# Patient Record
Sex: Female | Born: 1965 | Race: Black or African American | Hispanic: No | Marital: Single | Smoking: Former smoker
Health system: Southern US, Community
[De-identification: ages and names within clinical notes are randomized; demographics above are authoritative.]

## PROBLEM LIST (undated history)

## (undated) DIAGNOSIS — E079 Disorder of thyroid, unspecified: Secondary | ICD-10-CM

## (undated) DIAGNOSIS — N63 Unspecified lump in unspecified breast: Secondary | ICD-10-CM

## (undated) DIAGNOSIS — C50919 Malignant neoplasm of unspecified site of unspecified female breast: Secondary | ICD-10-CM

## (undated) DIAGNOSIS — C801 Malignant (primary) neoplasm, unspecified: Secondary | ICD-10-CM

## (undated) DIAGNOSIS — I10 Essential (primary) hypertension: Secondary | ICD-10-CM

## (undated) DIAGNOSIS — Z923 Personal history of irradiation: Secondary | ICD-10-CM

## (undated) HISTORY — DX: Malignant (primary) neoplasm, unspecified: C80.1

## (undated) HISTORY — PX: KNEE ARTHROSCOPY: SUR90

## (undated) HISTORY — DX: Malignant neoplasm of unspecified site of unspecified female breast: C50.919

## (undated) HISTORY — DX: Unspecified lump in unspecified breast: N63.0

## (undated) HISTORY — PX: BREAST SURGERY: SHX581

## (undated) HISTORY — DX: Disorder of thyroid, unspecified: E07.9

## (undated) HISTORY — DX: Personal history of irradiation: Z92.3

---

## 2004-08-09 HISTORY — PX: KNEE CARTILAGE SURGERY: SHX688

## 2006-02-25 ENCOUNTER — Emergency Department (HOSPITAL_COMMUNITY): Admission: EM | Admit: 2006-02-25 | Discharge: 2006-02-26 | Payer: Self-pay | Admitting: Emergency Medicine

## 2008-08-11 ENCOUNTER — Emergency Department (HOSPITAL_COMMUNITY): Admission: EM | Admit: 2008-08-11 | Discharge: 2008-08-11 | Payer: Self-pay | Admitting: Emergency Medicine

## 2008-09-04 ENCOUNTER — Emergency Department (HOSPITAL_COMMUNITY): Admission: EM | Admit: 2008-09-04 | Discharge: 2008-09-04 | Payer: Self-pay | Admitting: Emergency Medicine

## 2009-07-26 ENCOUNTER — Emergency Department (HOSPITAL_COMMUNITY): Admission: EM | Admit: 2009-07-26 | Discharge: 2009-07-26 | Payer: Self-pay | Admitting: Emergency Medicine

## 2009-09-09 DIAGNOSIS — C50919 Malignant neoplasm of unspecified site of unspecified female breast: Secondary | ICD-10-CM

## 2009-09-09 HISTORY — DX: Malignant neoplasm of unspecified site of unspecified female breast: C50.919

## 2009-09-15 ENCOUNTER — Ambulatory Visit: Payer: Self-pay | Admitting: Family Medicine

## 2009-09-15 ENCOUNTER — Inpatient Hospital Stay (HOSPITAL_COMMUNITY): Admission: EM | Admit: 2009-09-15 | Discharge: 2009-09-20 | Payer: Self-pay | Admitting: Emergency Medicine

## 2009-09-15 ENCOUNTER — Ambulatory Visit: Payer: Self-pay | Admitting: Pulmonary Disease

## 2009-09-16 ENCOUNTER — Encounter: Admission: RE | Admit: 2009-09-16 | Discharge: 2009-09-16 | Payer: Self-pay | Admitting: Family Medicine

## 2009-09-17 ENCOUNTER — Ambulatory Visit: Payer: Self-pay | Admitting: Oncology

## 2009-09-18 ENCOUNTER — Encounter: Payer: Self-pay | Admitting: Oncology

## 2009-09-19 ENCOUNTER — Encounter: Payer: Self-pay | Admitting: Family Medicine

## 2009-10-01 ENCOUNTER — Encounter: Payer: Self-pay | Admitting: Family Medicine

## 2009-10-02 ENCOUNTER — Ambulatory Visit (HOSPITAL_BASED_OUTPATIENT_CLINIC_OR_DEPARTMENT_OTHER): Admission: RE | Admit: 2009-10-02 | Discharge: 2009-10-02 | Payer: Self-pay | Admitting: Surgery

## 2009-10-07 LAB — CBC WITH DIFFERENTIAL/PLATELET
BASO%: 0.1 % (ref 0.0–2.0)
Basophils Absolute: 0 10*3/uL (ref 0.0–0.1)
EOS%: 0.1 % (ref 0.0–7.0)
HGB: 12.7 g/dL (ref 11.6–15.9)
MCH: 27.2 pg (ref 25.1–34.0)
MCHC: 32.6 g/dL (ref 31.5–36.0)
MCV: 83.3 fL (ref 79.5–101.0)
MONO%: 2.8 % (ref 0.0–14.0)
NEUT%: 88.3 % — ABNORMAL HIGH (ref 38.4–76.8)
RDW: 16.2 % — ABNORMAL HIGH (ref 11.2–14.5)
lymph#: 0.8 10*3/uL — ABNORMAL LOW (ref 0.9–3.3)

## 2009-10-07 LAB — COMPREHENSIVE METABOLIC PANEL
ALT: 35 U/L (ref 0–35)
AST: 23 U/L (ref 0–37)
Albumin: 4.3 g/dL (ref 3.5–5.2)
Alkaline Phosphatase: 77 U/L (ref 39–117)
Calcium: 9.4 mg/dL (ref 8.4–10.5)
Chloride: 101 mEq/L (ref 96–112)
Potassium: 4.2 mEq/L (ref 3.5–5.3)
Sodium: 134 mEq/L — ABNORMAL LOW (ref 135–145)

## 2009-10-08 ENCOUNTER — Ambulatory Visit: Admission: RE | Admit: 2009-10-08 | Discharge: 2009-11-14 | Payer: Self-pay | Admitting: Radiation Oncology

## 2009-10-15 LAB — CBC WITH DIFFERENTIAL/PLATELET
BASO%: 0.1 % (ref 0.0–2.0)
EOS%: 0.4 % (ref 0.0–7.0)
HCT: 36.9 % (ref 34.8–46.6)
LYMPH%: 15 % (ref 14.0–49.7)
MCH: 28.2 pg (ref 25.1–34.0)
MCHC: 33.6 g/dL (ref 31.5–36.0)
MONO%: 6.3 % (ref 0.0–14.0)
NEUT%: 78.2 % — ABNORMAL HIGH (ref 38.4–76.8)
Platelets: 174 10*3/uL (ref 145–400)
RBC: 4.4 10*6/uL (ref 3.70–5.45)
WBC: 8.1 10*3/uL (ref 3.9–10.3)

## 2009-10-17 ENCOUNTER — Ambulatory Visit: Payer: Self-pay | Admitting: Oncology

## 2009-10-21 LAB — CBC WITH DIFFERENTIAL/PLATELET
Basophils Absolute: 0 10*3/uL (ref 0.0–0.1)
Eosinophils Absolute: 0 10*3/uL (ref 0.0–0.5)
LYMPH%: 7.1 % — ABNORMAL LOW (ref 14.0–49.7)
MCH: 28.2 pg (ref 25.1–34.0)
MCHC: 33.9 g/dL (ref 31.5–36.0)
MONO#: 0.6 10*3/uL (ref 0.1–0.9)
MONO%: 7.4 % (ref 0.0–14.0)
RBC: 4.19 10*6/uL (ref 3.70–5.45)
RDW: 16 % — ABNORMAL HIGH (ref 11.2–14.5)
WBC: 8.1 10*3/uL (ref 3.9–10.3)
lymph#: 0.6 10*3/uL — ABNORMAL LOW (ref 0.9–3.3)

## 2009-10-28 LAB — CBC WITH DIFFERENTIAL/PLATELET
Basophils Absolute: 0 10*3/uL (ref 0.0–0.1)
Eosinophils Absolute: 0 10*3/uL (ref 0.0–0.5)
HCT: 36.6 % (ref 34.8–46.6)
HGB: 12.1 g/dL (ref 11.6–15.9)
LYMPH%: 2 % — ABNORMAL LOW (ref 14.0–49.7)
MONO#: 1.1 10*3/uL — ABNORMAL HIGH (ref 0.1–0.9)
NEUT#: 14.1 10*3/uL — ABNORMAL HIGH (ref 1.5–6.5)
NEUT%: 90.6 % — ABNORMAL HIGH (ref 38.4–76.8)
Platelets: 453 10*3/uL — ABNORMAL HIGH (ref 145–400)
WBC: 15.5 10*3/uL — ABNORMAL HIGH (ref 3.9–10.3)
lymph#: 0.3 10*3/uL — ABNORMAL LOW (ref 0.9–3.3)

## 2009-10-28 LAB — COMPREHENSIVE METABOLIC PANEL
Albumin: 4.3 g/dL (ref 3.5–5.2)
Alkaline Phosphatase: 90 U/L (ref 39–117)
BUN: 15 mg/dL (ref 6–23)
CO2: 21 mEq/L (ref 19–32)
Glucose, Bld: 84 mg/dL (ref 70–99)
Total Bilirubin: 0.3 mg/dL (ref 0.3–1.2)

## 2009-11-01 ENCOUNTER — Emergency Department (HOSPITAL_COMMUNITY): Admission: EM | Admit: 2009-11-01 | Discharge: 2009-11-01 | Payer: Self-pay | Admitting: Emergency Medicine

## 2009-11-02 ENCOUNTER — Emergency Department (HOSPITAL_COMMUNITY): Admission: EM | Admit: 2009-11-02 | Discharge: 2009-11-02 | Payer: Self-pay | Admitting: Emergency Medicine

## 2009-11-04 ENCOUNTER — Inpatient Hospital Stay (HOSPITAL_COMMUNITY): Admission: AD | Admit: 2009-11-04 | Discharge: 2009-11-07 | Payer: Self-pay | Admitting: Oncology

## 2009-11-04 ENCOUNTER — Ambulatory Visit: Payer: Self-pay | Admitting: Oncology

## 2009-11-04 LAB — CBC WITH DIFFERENTIAL/PLATELET
Basophils Absolute: 0.1 10*3/uL (ref 0.0–0.1)
EOS%: 0.1 % (ref 0.0–7.0)
Eosinophils Absolute: 0 10*3/uL (ref 0.0–0.5)
HCT: 40.3 % (ref 34.8–46.6)
HGB: 13.7 g/dL (ref 11.6–15.9)
MCH: 27.2 pg (ref 25.1–34.0)
MCV: 80.1 fL (ref 79.5–101.0)
MONO%: 12.2 % (ref 0.0–14.0)
NEUT#: 13.3 10*3/uL — ABNORMAL HIGH (ref 1.5–6.5)
NEUT%: 83.3 % — ABNORMAL HIGH (ref 38.4–76.8)
lymph#: 0.7 10*3/uL — ABNORMAL LOW (ref 0.9–3.3)

## 2009-11-11 LAB — CBC WITH DIFFERENTIAL/PLATELET
BASO%: 0 % (ref 0.0–2.0)
Eosinophils Absolute: 0 10*3/uL (ref 0.0–0.5)
HCT: 31.9 % — ABNORMAL LOW (ref 34.8–46.6)
LYMPH%: 3.2 % — ABNORMAL LOW (ref 14.0–49.7)
MCHC: 33.4 g/dL (ref 31.5–36.0)
MONO#: 0.4 10*3/uL (ref 0.1–0.9)
NEUT%: 92 % — ABNORMAL HIGH (ref 38.4–76.8)
Platelets: 130 10*3/uL — ABNORMAL LOW (ref 145–400)
WBC: 7.9 10*3/uL (ref 3.9–10.3)

## 2009-11-14 ENCOUNTER — Ambulatory Visit: Payer: Self-pay | Admitting: Oncology

## 2009-11-18 LAB — CBC WITH DIFFERENTIAL/PLATELET
Basophils Absolute: 0 10*3/uL (ref 0.0–0.1)
EOS%: 0 % (ref 0.0–7.0)
HCT: 35.6 % (ref 34.8–46.6)
HGB: 11.7 g/dL (ref 11.6–15.9)
LYMPH%: 3 % — ABNORMAL LOW (ref 14.0–49.7)
MCH: 28.3 pg (ref 25.1–34.0)
MCV: 86 fL (ref 79.5–101.0)
MONO%: 3.7 % (ref 0.0–14.0)
NEUT%: 93.2 % — ABNORMAL HIGH (ref 38.4–76.8)
Platelets: 225 10*3/uL (ref 145–400)

## 2009-11-24 ENCOUNTER — Ambulatory Visit (HOSPITAL_COMMUNITY): Admission: RE | Admit: 2009-11-24 | Discharge: 2009-11-24 | Payer: Self-pay | Admitting: Oncology

## 2009-11-24 LAB — CBC WITH DIFFERENTIAL/PLATELET
BASO%: 1.4 % (ref 0.0–2.0)
Eosinophils Absolute: 0 10*3/uL (ref 0.0–0.5)
LYMPH%: 7.5 % — ABNORMAL LOW (ref 14.0–49.7)
MCHC: 33.5 g/dL (ref 31.5–36.0)
MCV: 82.9 fL (ref 79.5–101.0)
MONO%: 19.2 % — ABNORMAL HIGH (ref 0.0–14.0)
Platelets: 131 10*3/uL — ABNORMAL LOW (ref 145–400)
RBC: 4.14 10*6/uL (ref 3.70–5.45)
nRBC: 2 % — ABNORMAL HIGH (ref 0–0)

## 2009-11-24 LAB — COMPREHENSIVE METABOLIC PANEL
ALT: 54 U/L — ABNORMAL HIGH (ref 0–35)
BUN: 13 mg/dL (ref 6–23)
CO2: 27 mEq/L (ref 19–32)
Creatinine, Ser: 0.73 mg/dL (ref 0.40–1.20)
Total Bilirubin: 0.8 mg/dL (ref 0.3–1.2)

## 2009-11-24 LAB — TSH: TSH: 4.728 u[IU]/mL — ABNORMAL HIGH (ref 0.350–4.500)

## 2009-12-02 LAB — COMPREHENSIVE METABOLIC PANEL
Albumin: 3.8 g/dL (ref 3.5–5.2)
BUN: 9 mg/dL (ref 6–23)
CO2: 27 mEq/L (ref 19–32)
Calcium: 9 mg/dL (ref 8.4–10.5)
Chloride: 106 mEq/L (ref 96–112)
Creatinine, Ser: 1.01 mg/dL (ref 0.40–1.20)
Glucose, Bld: 90 mg/dL (ref 70–99)
Potassium: 4.3 mEq/L (ref 3.5–5.3)

## 2009-12-02 LAB — CBC WITH DIFFERENTIAL/PLATELET
Basophils Absolute: 0 10*3/uL (ref 0.0–0.1)
EOS%: 0 % (ref 0.0–7.0)
Eosinophils Absolute: 0 10*3/uL (ref 0.0–0.5)
HCT: 29.8 % — ABNORMAL LOW (ref 34.8–46.6)
HGB: 10.1 g/dL — ABNORMAL LOW (ref 11.6–15.9)
MCH: 29.1 pg (ref 25.1–34.0)
MONO#: 0.5 10*3/uL (ref 0.1–0.9)
NEUT#: 8.8 10*3/uL — ABNORMAL HIGH (ref 1.5–6.5)
NEUT%: 91.9 % — ABNORMAL HIGH (ref 38.4–76.8)
lymph#: 0.3 10*3/uL — ABNORMAL LOW (ref 0.9–3.3)

## 2009-12-02 LAB — TSH: TSH: 15.506 u[IU]/mL — ABNORMAL HIGH (ref 0.350–4.500)

## 2009-12-09 LAB — CBC WITH DIFFERENTIAL/PLATELET
BASO%: 0.1 % (ref 0.0–2.0)
Basophils Absolute: 0 10*3/uL (ref 0.0–0.1)
EOS%: 0.1 % (ref 0.0–7.0)
HGB: 9.9 g/dL — ABNORMAL LOW (ref 11.6–15.9)
MCH: 27.9 pg (ref 25.1–34.0)
MCHC: 32.5 g/dL (ref 31.5–36.0)
MCV: 85.9 fL (ref 79.5–101.0)
MONO%: 3.3 % (ref 0.0–14.0)
RBC: 3.55 10*6/uL — ABNORMAL LOW (ref 3.70–5.45)
RDW: 22.7 % — ABNORMAL HIGH (ref 11.2–14.5)
lymph#: 0.6 10*3/uL — ABNORMAL LOW (ref 0.9–3.3)

## 2009-12-15 ENCOUNTER — Ambulatory Visit: Payer: Self-pay | Admitting: Oncology

## 2009-12-16 LAB — CBC WITH DIFFERENTIAL/PLATELET
Basophils Absolute: 0 10*3/uL (ref 0.0–0.1)
Eosinophils Absolute: 0 10*3/uL (ref 0.0–0.5)
HGB: 10.3 g/dL — ABNORMAL LOW (ref 11.6–15.9)
MCV: 86.5 fL (ref 79.5–101.0)
MONO#: 0.1 10*3/uL (ref 0.1–0.9)
MONO%: 23.8 % — ABNORMAL HIGH (ref 0.0–14.0)
NEUT#: 0 10*3/uL — CL (ref 1.5–6.5)
RBC: 3.53 10*6/uL — ABNORMAL LOW (ref 3.70–5.45)
RDW: 22.3 % — ABNORMAL HIGH (ref 11.2–14.5)
WBC: 0.3 10*3/uL — CL (ref 3.9–10.3)
lymph#: 0.2 10*3/uL — ABNORMAL LOW (ref 0.9–3.3)

## 2009-12-23 LAB — CBC WITH DIFFERENTIAL/PLATELET
Basophils Absolute: 0 10*3/uL (ref 0.0–0.1)
Eosinophils Absolute: 0 10*3/uL (ref 0.0–0.5)
HGB: 10.4 g/dL — ABNORMAL LOW (ref 11.6–15.9)
LYMPH%: 7.2 % — ABNORMAL LOW (ref 14.0–49.7)
MCV: 88.3 fL (ref 79.5–101.0)
MONO#: 0.5 10*3/uL (ref 0.1–0.9)
NEUT#: 2 10*3/uL (ref 1.5–6.5)
Platelets: 283 10*3/uL (ref 145–400)
RBC: 3.54 10*6/uL — ABNORMAL LOW (ref 3.70–5.45)
WBC: 2.7 10*3/uL — ABNORMAL LOW (ref 3.9–10.3)

## 2009-12-23 LAB — BASIC METABOLIC PANEL
CO2: 25 mEq/L (ref 19–32)
Chloride: 108 mEq/L (ref 96–112)
Creatinine, Ser: 0.86 mg/dL (ref 0.40–1.20)
Glucose, Bld: 82 mg/dL (ref 70–99)
Sodium: 142 mEq/L (ref 135–145)

## 2009-12-23 LAB — TSH: TSH: 4.152 u[IU]/mL (ref 0.350–4.500)

## 2009-12-29 ENCOUNTER — Emergency Department (HOSPITAL_COMMUNITY): Admission: EM | Admit: 2009-12-29 | Discharge: 2009-12-29 | Payer: Self-pay | Admitting: Emergency Medicine

## 2009-12-30 LAB — CBC WITH DIFFERENTIAL/PLATELET
Basophils Absolute: 0 10*3/uL (ref 0.0–0.1)
EOS%: 0 % (ref 0.0–7.0)
HCT: 37.4 % (ref 34.8–46.6)
HGB: 12 g/dL (ref 11.6–15.9)
LYMPH%: 7.3 % — ABNORMAL LOW (ref 14.0–49.7)
MCH: 28.5 pg (ref 25.1–34.0)
MCHC: 32.1 g/dL (ref 31.5–36.0)
MCV: 88.8 fL (ref 79.5–101.0)
MONO%: 11.3 % (ref 0.0–14.0)
NEUT%: 81.1 % — ABNORMAL HIGH (ref 38.4–76.8)

## 2009-12-30 LAB — COMPREHENSIVE METABOLIC PANEL
Albumin: 3.9 g/dL (ref 3.5–5.2)
Alkaline Phosphatase: 77 U/L (ref 39–117)
BUN: 8 mg/dL (ref 6–23)
Creatinine, Ser: 0.68 mg/dL (ref 0.40–1.20)
Glucose, Bld: 142 mg/dL — ABNORMAL HIGH (ref 70–99)
Total Bilirubin: 0.7 mg/dL (ref 0.3–1.2)

## 2010-01-06 LAB — CBC WITH DIFFERENTIAL/PLATELET
Basophils Absolute: 0 10*3/uL (ref 0.0–0.1)
Eosinophils Absolute: 0 10*3/uL (ref 0.0–0.5)
HCT: 30.4 % — ABNORMAL LOW (ref 34.8–46.6)
HGB: 9.9 g/dL — ABNORMAL LOW (ref 11.6–15.9)
MCV: 87.1 fL (ref 79.5–101.0)
MONO%: 30.3 % — ABNORMAL HIGH (ref 0.0–14.0)
NEUT#: 0.1 10*3/uL — CL (ref 1.5–6.5)
Platelets: 77 10*3/uL — ABNORMAL LOW (ref 145–400)
RDW: 19 % — ABNORMAL HIGH (ref 11.2–14.5)

## 2010-01-13 LAB — CBC WITH DIFFERENTIAL/PLATELET
Basophils Absolute: 0 10*3/uL (ref 0.0–0.1)
Eosinophils Absolute: 0 10*3/uL (ref 0.0–0.5)
HGB: 10 g/dL — ABNORMAL LOW (ref 11.6–15.9)
LYMPH%: 2.2 % — ABNORMAL LOW (ref 14.0–49.7)
MCV: 89.5 fL (ref 79.5–101.0)
MONO%: 0.2 % (ref 0.0–14.0)
NEUT#: 22.1 10*3/uL — ABNORMAL HIGH (ref 1.5–6.5)
Platelets: 143 10*3/uL — ABNORMAL LOW (ref 145–400)

## 2010-01-14 ENCOUNTER — Ambulatory Visit: Payer: Self-pay | Admitting: Psychiatry

## 2010-01-16 ENCOUNTER — Ambulatory Visit: Payer: Self-pay | Admitting: Oncology

## 2010-01-20 LAB — CBC WITH DIFFERENTIAL/PLATELET
Eosinophils Absolute: 0 10*3/uL (ref 0.0–0.5)
LYMPH%: 1.2 % — ABNORMAL LOW (ref 14.0–49.7)
MCV: 91.1 fL (ref 79.5–101.0)
MONO%: 3.3 % (ref 0.0–14.0)
NEUT#: 17.2 10*3/uL — ABNORMAL HIGH (ref 1.5–6.5)
NEUT%: 95.3 % — ABNORMAL HIGH (ref 38.4–76.8)
Platelets: 349 10*3/uL (ref 145–400)
RBC: 3.4 10*6/uL — ABNORMAL LOW (ref 3.70–5.45)

## 2010-01-23 ENCOUNTER — Ambulatory Visit (HOSPITAL_COMMUNITY): Admission: RE | Admit: 2010-01-23 | Discharge: 2010-01-23 | Payer: Self-pay | Admitting: Oncology

## 2010-01-27 LAB — COMPREHENSIVE METABOLIC PANEL
BUN: 10 mg/dL (ref 6–23)
CO2: 28 mEq/L (ref 19–32)
Calcium: 9.3 mg/dL (ref 8.4–10.5)
Chloride: 105 mEq/L (ref 96–112)
Creatinine, Ser: 0.79 mg/dL (ref 0.40–1.20)
Glucose, Bld: 116 mg/dL — ABNORMAL HIGH (ref 70–99)
Total Bilirubin: 0.7 mg/dL (ref 0.3–1.2)

## 2010-01-27 LAB — CBC WITH DIFFERENTIAL/PLATELET
Basophils Absolute: 0 10*3/uL (ref 0.0–0.1)
Eosinophils Absolute: 0 10*3/uL (ref 0.0–0.5)
HCT: 29.7 % — ABNORMAL LOW (ref 34.8–46.6)
HGB: 9.6 g/dL — ABNORMAL LOW (ref 11.6–15.9)
LYMPH%: 25.9 % (ref 14.0–49.7)
MCV: 89.5 fL (ref 79.5–101.0)
MONO%: 33.3 % — ABNORMAL HIGH (ref 0.0–14.0)
NEUT#: 0.1 10*3/uL — CL (ref 1.5–6.5)
NEUT%: 33.4 % — ABNORMAL LOW (ref 38.4–76.8)
Platelets: 48 10*3/uL — ABNORMAL LOW (ref 145–400)
RBC: 3.32 10*6/uL — ABNORMAL LOW (ref 3.70–5.45)

## 2010-01-27 LAB — URINALYSIS, MICROSCOPIC - CHCC
Glucose: NEGATIVE g/dL
Ketones: NEGATIVE mg/dL
Specific Gravity, Urine: 1.03 (ref 1.003–1.035)

## 2010-01-27 LAB — CANCER ANTIGEN 27.29: CA 27.29: 13 U/mL (ref 0–39)

## 2010-01-27 LAB — PROTIME-INR: Protime: 13.2 Seconds (ref 10.6–13.4)

## 2010-01-30 ENCOUNTER — Ambulatory Visit (HOSPITAL_COMMUNITY): Admission: RE | Admit: 2010-01-30 | Discharge: 2010-01-30 | Payer: Self-pay | Admitting: Oncology

## 2010-02-04 LAB — CBC WITH DIFFERENTIAL/PLATELET
BASO%: 0.2 % (ref 0.0–2.0)
Basophils Absolute: 0 10*3/uL (ref 0.0–0.1)
EOS%: 0.1 % (ref 0.0–7.0)
HCT: 30.6 % — ABNORMAL LOW (ref 34.8–46.6)
HGB: 9.6 g/dL — ABNORMAL LOW (ref 11.6–15.9)
MCH: 29.2 pg (ref 25.1–34.0)
MCHC: 31.4 g/dL — ABNORMAL LOW (ref 31.5–36.0)
MCV: 93 fL (ref 79.5–101.0)
MONO%: 9.7 % (ref 0.0–14.0)
NEUT%: 84.9 % — ABNORMAL HIGH (ref 38.4–76.8)
RDW: 19.7 % — ABNORMAL HIGH (ref 11.2–14.5)
lymph#: 0.6 10*3/uL — ABNORMAL LOW (ref 0.9–3.3)

## 2010-02-24 ENCOUNTER — Ambulatory Visit: Payer: Self-pay | Admitting: Oncology

## 2010-02-26 LAB — CBC WITH DIFFERENTIAL/PLATELET
Basophils Absolute: 0 10*3/uL (ref 0.0–0.1)
Eosinophils Absolute: 0.1 10*3/uL (ref 0.0–0.5)
HGB: 10.9 g/dL — ABNORMAL LOW (ref 11.6–15.9)
LYMPH%: 9.5 % — ABNORMAL LOW (ref 14.0–49.7)
MCV: 91.8 fL (ref 79.5–101.0)
MONO%: 18.9 % — ABNORMAL HIGH (ref 0.0–14.0)
NEUT#: 3.2 10*3/uL (ref 1.5–6.5)
Platelets: 236 10*3/uL (ref 145–400)
RBC: 3.5 10*6/uL — ABNORMAL LOW (ref 3.70–5.45)

## 2010-02-26 LAB — COMPREHENSIVE METABOLIC PANEL
Alkaline Phosphatase: 63 U/L (ref 39–117)
BUN: 9 mg/dL (ref 6–23)
Glucose, Bld: 96 mg/dL (ref 70–99)
Total Bilirubin: 0.4 mg/dL (ref 0.3–1.2)

## 2010-03-26 ENCOUNTER — Encounter: Admission: RE | Admit: 2010-03-26 | Discharge: 2010-03-26 | Payer: Self-pay | Admitting: Surgery

## 2010-03-27 ENCOUNTER — Ambulatory Visit (HOSPITAL_COMMUNITY): Admission: RE | Admit: 2010-03-27 | Discharge: 2010-03-27 | Payer: Self-pay | Admitting: Surgery

## 2010-04-06 ENCOUNTER — Ambulatory Visit: Payer: Self-pay | Admitting: Oncology

## 2010-04-08 ENCOUNTER — Encounter: Payer: Self-pay | Admitting: Oncology

## 2010-04-08 ENCOUNTER — Ambulatory Visit: Payer: Self-pay | Admitting: Vascular Surgery

## 2010-04-08 ENCOUNTER — Ambulatory Visit: Admission: RE | Admit: 2010-04-08 | Discharge: 2010-04-08 | Payer: Self-pay | Admitting: Oncology

## 2010-04-08 LAB — CBC WITH DIFFERENTIAL/PLATELET
BASO%: 0 % (ref 0.0–2.0)
EOS%: 0 % (ref 0.0–7.0)
HCT: 38.6 % (ref 34.8–46.6)
MCH: 27.5 pg (ref 25.1–34.0)
MCHC: 32.4 g/dL (ref 31.5–36.0)
NEUT%: 86.2 % — ABNORMAL HIGH (ref 38.4–76.8)
RBC: 4.54 10*6/uL (ref 3.70–5.45)
RDW: 15.8 % — ABNORMAL HIGH (ref 11.2–14.5)
WBC: 4.7 10*3/uL (ref 3.9–10.3)
lymph#: 0.6 10*3/uL — ABNORMAL LOW (ref 0.9–3.3)
nRBC: 0 % (ref 0–0)

## 2010-04-08 LAB — COMPREHENSIVE METABOLIC PANEL
ALT: 23 U/L (ref 0–35)
AST: 20 U/L (ref 0–37)
Albumin: 4.1 g/dL (ref 3.5–5.2)
Alkaline Phosphatase: 63 U/L (ref 39–117)
BUN: 11 mg/dL (ref 6–23)
Potassium: 4.3 mEq/L (ref 3.5–5.3)

## 2010-04-20 ENCOUNTER — Ambulatory Visit
Admission: RE | Admit: 2010-04-20 | Discharge: 2010-07-08 | Payer: Self-pay | Source: Home / Self Care | Admitting: Radiation Oncology

## 2010-04-29 ENCOUNTER — Ambulatory Visit (HOSPITAL_COMMUNITY): Admission: RE | Admit: 2010-04-29 | Discharge: 2010-04-29 | Payer: Self-pay | Admitting: Radiation Oncology

## 2010-05-06 ENCOUNTER — Ambulatory Visit: Payer: Self-pay | Admitting: Oncology

## 2010-05-06 LAB — CBC WITH DIFFERENTIAL/PLATELET
BASO%: 0.3 % (ref 0.0–2.0)
Eosinophils Absolute: 0.1 10*3/uL (ref 0.0–0.5)
HCT: 38.7 % (ref 34.8–46.6)
MCHC: 33.1 g/dL (ref 31.5–36.0)
MONO#: 0.6 10*3/uL (ref 0.1–0.9)
NEUT#: 4.1 10*3/uL (ref 1.5–6.5)
RBC: 4.78 10*6/uL (ref 3.70–5.45)
WBC: 6 10*3/uL (ref 3.9–10.3)
lymph#: 1.3 10*3/uL (ref 0.9–3.3)

## 2010-05-06 LAB — COMPREHENSIVE METABOLIC PANEL
ALT: 16 U/L (ref 0–35)
Albumin: 4 g/dL (ref 3.5–5.2)
CO2: 20 mEq/L (ref 19–32)
Calcium: 8.9 mg/dL (ref 8.4–10.5)
Chloride: 108 mEq/L (ref 96–112)
Glucose, Bld: 119 mg/dL — ABNORMAL HIGH (ref 70–99)
Sodium: 139 mEq/L (ref 135–145)
Total Protein: 6.1 g/dL (ref 6.0–8.3)

## 2010-05-06 LAB — CANCER ANTIGEN 27.29: CA 27.29: 15 U/mL (ref 0–39)

## 2010-05-25 ENCOUNTER — Ambulatory Visit: Admission: RE | Admit: 2010-05-25 | Discharge: 2010-05-25 | Payer: Self-pay | Admitting: Oncology

## 2010-05-25 ENCOUNTER — Ambulatory Visit: Payer: Self-pay | Admitting: Vascular Surgery

## 2010-05-25 ENCOUNTER — Encounter: Payer: Self-pay | Admitting: Oncology

## 2010-06-04 ENCOUNTER — Ambulatory Visit (HOSPITAL_COMMUNITY): Admission: RE | Admit: 2010-06-04 | Discharge: 2010-06-04 | Payer: Self-pay | Admitting: Surgery

## 2010-06-16 ENCOUNTER — Ambulatory Visit: Payer: Self-pay | Admitting: Oncology

## 2010-07-01 LAB — CBC WITH DIFFERENTIAL/PLATELET
BASO%: 0.2 % (ref 0.0–2.0)
EOS%: 3.6 % (ref 0.0–7.0)
HGB: 12.7 g/dL (ref 11.6–15.9)
MCH: 26.6 pg (ref 25.1–34.0)
MCHC: 33.4 g/dL (ref 31.5–36.0)
MONO#: 0.6 10*3/uL (ref 0.1–0.9)
RDW: 19.8 % — ABNORMAL HIGH (ref 11.2–14.5)
WBC: 5.5 10*3/uL (ref 3.9–10.3)
lymph#: 0.5 10*3/uL — ABNORMAL LOW (ref 0.9–3.3)

## 2010-07-01 LAB — COMPREHENSIVE METABOLIC PANEL
ALT: 18 U/L (ref 0–35)
AST: 22 U/L (ref 0–37)
Albumin: 3.5 g/dL (ref 3.5–5.2)
Calcium: 9.2 mg/dL (ref 8.4–10.5)
Chloride: 106 mEq/L (ref 96–112)
Potassium: 4.9 mEq/L (ref 3.5–5.3)
Total Protein: 6.2 g/dL (ref 6.0–8.3)

## 2010-07-20 ENCOUNTER — Ambulatory Visit: Payer: Self-pay | Admitting: Oncology

## 2010-07-22 LAB — CBC WITH DIFFERENTIAL/PLATELET
BASO%: 0.1 % (ref 0.0–2.0)
Basophils Absolute: 0 10*3/uL (ref 0.0–0.1)
Eosinophils Absolute: 0.2 10*3/uL (ref 0.0–0.5)
HCT: 36 % (ref 34.8–46.6)
HGB: 11.9 g/dL (ref 11.6–15.9)
LYMPH%: 5.6 % — ABNORMAL LOW (ref 14.0–49.7)
MCHC: 33.2 g/dL (ref 31.5–36.0)
MONO#: 0.4 10*3/uL (ref 0.1–0.9)
NEUT#: 4.1 10*3/uL (ref 1.5–6.5)
NEUT%: 82.5 % — ABNORMAL HIGH (ref 38.4–76.8)
Platelets: 230 10*3/uL (ref 145–400)
WBC: 5 10*3/uL (ref 3.9–10.3)
lymph#: 0.3 10*3/uL — ABNORMAL LOW (ref 0.9–3.3)

## 2010-07-22 LAB — COMPREHENSIVE METABOLIC PANEL
ALT: 19 U/L (ref 0–35)
CO2: 24 mEq/L (ref 19–32)
Calcium: 8.9 mg/dL (ref 8.4–10.5)
Chloride: 108 mEq/L (ref 96–112)
Creatinine, Ser: 1.02 mg/dL (ref 0.40–1.20)
Glucose, Bld: 119 mg/dL — ABNORMAL HIGH (ref 70–99)

## 2010-07-22 LAB — CANCER ANTIGEN 27.29: CA 27.29: 10 U/mL (ref 0–39)

## 2010-07-27 ENCOUNTER — Ambulatory Visit (HOSPITAL_COMMUNITY)
Admission: RE | Admit: 2010-07-27 | Discharge: 2010-07-27 | Payer: Self-pay | Source: Home / Self Care | Attending: Oncology | Admitting: Oncology

## 2010-07-31 LAB — COMPREHENSIVE METABOLIC PANEL
Alkaline Phosphatase: 53 U/L (ref 39–117)
BUN: 17 mg/dL (ref 6–23)
CO2: 28 mEq/L (ref 19–32)
Creatinine, Ser: 1.19 mg/dL (ref 0.40–1.20)
Glucose, Bld: 101 mg/dL — ABNORMAL HIGH (ref 70–99)
Sodium: 141 mEq/L (ref 135–145)
Total Bilirubin: 0.4 mg/dL (ref 0.3–1.2)

## 2010-07-31 LAB — CBC WITH DIFFERENTIAL/PLATELET
Eosinophils Absolute: 0.2 10*3/uL (ref 0.0–0.5)
HCT: 35.2 % (ref 34.8–46.6)
LYMPH%: 10.7 % — ABNORMAL LOW (ref 14.0–49.7)
MCV: 84.2 fL (ref 79.5–101.0)
MONO#: 0.4 10*3/uL (ref 0.1–0.9)
MONO%: 9.5 % (ref 0.0–14.0)
NEUT#: 3 10*3/uL (ref 1.5–6.5)
NEUT%: 75.2 % (ref 38.4–76.8)
Platelets: 278 10*3/uL (ref 145–400)
RBC: 4.18 10*6/uL (ref 3.70–5.45)
WBC: 4 10*3/uL (ref 3.9–10.3)

## 2010-07-31 LAB — CANCER ANTIGEN 27.29: CA 27.29: 15 U/mL (ref 0–39)

## 2010-08-24 ENCOUNTER — Ambulatory Visit: Payer: Self-pay | Admitting: Oncology

## 2010-08-26 LAB — CBC WITH DIFFERENTIAL/PLATELET
BASO%: 0.4 % (ref 0.0–2.0)
Basophils Absolute: 0 10*3/uL (ref 0.0–0.1)
EOS%: 1.6 % (ref 0.0–7.0)
Eosinophils Absolute: 0.1 10*3/uL (ref 0.0–0.5)
HCT: 38.8 % (ref 34.8–46.6)
HGB: 12.7 g/dL (ref 11.6–15.9)
LYMPH%: 14.2 % (ref 14.0–49.7)
MCH: 28.1 pg (ref 25.1–34.0)
MCHC: 32.7 g/dL (ref 31.5–36.0)
MCV: 85.8 fL (ref 79.5–101.0)
MONO#: 0.5 10*3/uL (ref 0.1–0.9)
MONO%: 9.4 % (ref 0.0–14.0)
NEUT#: 4.2 10*3/uL (ref 1.5–6.5)
NEUT%: 74.4 % (ref 38.4–76.8)
Platelets: 131 10*3/uL — ABNORMAL LOW (ref 145–400)
RBC: 4.52 10*6/uL (ref 3.70–5.45)
RDW: 16.9 % — ABNORMAL HIGH (ref 11.2–14.5)
WBC: 5.7 10*3/uL (ref 3.9–10.3)
lymph#: 0.8 10*3/uL — ABNORMAL LOW (ref 0.9–3.3)

## 2010-08-26 LAB — COMPREHENSIVE METABOLIC PANEL
ALT: 13 U/L (ref 0–35)
AST: 17 U/L (ref 0–37)
Albumin: 4.1 g/dL (ref 3.5–5.2)
Alkaline Phosphatase: 59 U/L (ref 39–117)
BUN: 12 mg/dL (ref 6–23)
CO2: 25 mEq/L (ref 19–32)
Calcium: 9.6 mg/dL (ref 8.4–10.5)
Chloride: 107 mEq/L (ref 96–112)
Creatinine, Ser: 1.08 mg/dL (ref 0.40–1.20)
Glucose, Bld: 85 mg/dL (ref 70–99)
Potassium: 4.1 mEq/L (ref 3.5–5.3)
Sodium: 141 mEq/L (ref 135–145)
Total Bilirubin: 0.3 mg/dL (ref 0.3–1.2)
Total Protein: 6.6 g/dL (ref 6.0–8.3)

## 2010-08-26 LAB — CANCER ANTIGEN 27.29: CA 27.29: 15 U/mL (ref 0–39)

## 2010-08-28 ENCOUNTER — Emergency Department (HOSPITAL_COMMUNITY)
Admission: EM | Admit: 2010-08-28 | Discharge: 2010-08-28 | Payer: Self-pay | Source: Home / Self Care | Admitting: Emergency Medicine

## 2010-08-30 ENCOUNTER — Encounter: Payer: Self-pay | Admitting: Oncology

## 2010-09-09 NOTE — Miscellaneous (Signed)
Summary: [facility] Consultation   NAME:  Kristin [name], MONDESIR              ACCOUNT NO.:  [id number]      MEDICAL RECORD NO.:  [id number]          PATIENT TYPE:  INP      LOCATION:  5157                         FACILITY:  [facility]      PHYSICIAN:  Valentino Hue. Magrinat, M.D.DATE OF BIRTH:  [birth year]      DATE OF CONSULTATION:   DATE OF DISCHARGE:                                    CONSULTATION      Kristin [name] is a 45 year old woman, who presented to the emergency   room on September 15, 2009, complaining of back pain.  The patient had   prior visits to the emergency room on September 04, 2009, complaining of   headache and on August 11, 2009, complaining of back pain.  She was   found to have a urinary tract infection and treated accordingly.  This   admission, in addition to the back pain, the patient complained of dry   cough.  A chest x-ray was obtained and it showed an elevated left hilum   with parenchymal scarring and a possible left hilar mass.  Accordingly,   a CT scan of the chest was obtained.  This confirmed left upper lobe   scar tissue with hilar retraction, but no definite mass or airway   occlusion and no adenopathy.  There was no pleural fluid.  On the other   hand, there were multiple bony lesions noted.  These were lytic, in   particular the T5 lesion involved the posterior aspect of the vertebral   body and eroded the posterior margin of the vertebral body adjacent to   the neural canal.  The T6 vertebra was partially compressed.  There was   also a lesion in the right posterior 11th rib.  There were T-spine   lesions and any of these would be amenable to biopsy if needed.  There   was no obvious disease in the upper abdomen.      Physical exam showed the left breast to be larger than the right with a   very large mass in the left breast and palpable axillary adenopathy.   The patient was sent for mammography and ultrasonography on September 16, 2009, to the imaging  center.  The breasts were diffusely dense, but in   the left breast, there was distortion and increased density in the upper   outer quadrant with a poorly defined mass and enlarged lymph node.   There was also skin thickening medially and inferiorly in the left   breast.  The right breast was unremarkable.  On physical exam, Dr.   Rolla Plate was able to palpate a large firm mass occupying most of   the lateral left breast and the upper outer quadrant with some skin   thickening.  There were enlarged palpable left axillary lymph nodes.   Ultrasound of the left breast showed a large diffuse hypoechoic mass   which could not be discretely measured.  Biopsy was obtained and is   pending at this time.  With this information, the patient is referred for further evaluation   and treatment.      The past medical history is significant for:   1. Tuberculosis, this was diagnosed according to the patient around       2004.  The patient's mother is felt to be the contact.  The patient       was treated with 2 pills for 18 months.  I should note that the       patient was checked for HIV this admission and was found to be       negative.   2. Hypothyroidism.   3. Status post left knee arthroscopy remotely at [facility].   4. History of periumbilical hernia repair, remote.   5. Tobacco abuse approximately half a pack per day for 30 years.      FAMILY HISTORY:  The patient's mother is alive at age 17.  As noted, she   has a history of treated tuberculosis.  The patient's father died from a   drug overdose and the patient really does not know much about him.  The   patient has one brother with hypertension, one sister, Dois Davenport, [phone].   There is no history of breast or ovarian cancer in the family.      GYNECOLOGIC HISTORY:  The patient is GX P1, first pregnancy to term age   72.  Her son was premature.  Last menstrual period is current.      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  She has been unemployed the past 1 or 2 years.   She used to live in [place], but came to live with her mother for   financial reasons.  Her mother is Linsay Vogt.  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  The patient's son, 76 years old,   attends [employer].  He was held back a couple of times, so he   has 2 years to go before graduation.  The patient is a Control and instrumentation engineer.      HEALTH MAINTENANCE:  She has not had a colonoscopy or bone density.  She   does not know her cholesterol level.  She is not an alcohol user, does   not use drugs.  She smokes 1/3-1 pack cigarettes per day.  She has not   had a recent Pap smear.      ALLERGIES:  No known drug allergies.      MEDICATIONS:  Her only medications at home were Synthroid 150 mcg daily   which she obtained through the [facility].      REVIEW OF SYSTEMS:  Currently denies headaches, visual changes,   dizziness, nausea or vomiting, although she did have headaches recently   which remain unexplained, a little bit of a dry cough.  No pleurisy, no   hemoptysis.  Denies fever, rash, or bleeding problems.  Denies chest   pain or pressure.  Denies focal weakness of the extremities or tingling   or numbness or radicular symptoms.  There had been no change in bowel or   bladder habits.      PHYSICAL EXAMINATION:  GENERAL:  She is a middle-aged [place] American   woman.   VITAL SIGNS:  Weighs 69.5 pounds, I do not have a height recorded.   Temperature is 97.8, pulse 66, respiratory rate 19, blood pressure   143/93, her  room air saturation is 96%.   HEENT:  Sclerae are nonicteric.  Oropharynx is clear.   NECK:  I do not palpate any cervical or supraclavicular adenopathy.   LUNGS:  No crackles or wheezes.   HEART:  Regular rate and rhythm.  No murmur appreciated.   BREASTS:  Right breast, no suspicious masses.  Left breast is larger   than the right.  There is at  least a 20-cm mass involving most of the   left breast, particularly upper outer quadrant.  I do not see peau   d'orange changes and I do not see erythema.  There is obvious palpable   left axillary adenopathy measuring approximately 2 cm.   ABDOMEN:  Soft, nontender.  Bowel sounds positive.   MUSCULOSKELETAL:  No focal spinal tenderness including over the T4-T6   area to percussion and palpation.   NEUROLOGIC:  Entirely nonfocal.      Labs show a white cell count of 8.6, hemoglobin 13.1, MCV 83.6, and   platelets 300,000.  Creatinine is 0.96.  Liver function tests show a   bilirubin of 0.5, alkaline phosphatase 71, AST 17, ALT 18, calcium 9.6.   TSH was 24.2.  Prothrombin time, INR 1.03, PTT 33, and the HIV antibody   was nonreactive.  Urinalysis was unremarkable.      FILMS:  The patient's mammogram and CT of the chest have already been   noted.      IMPRESSION AND PLAN:  A 45 year old [place] woman presenting with   midback pain and a cough, found to have an enlarged left breast and left   axillary adenopathy, scans showing lytic bone lesions and erosion of the   T5 vertebral body, status post left breast biopsy on September 16, 2009,   with final report pending.      The patient will need complete staging with CT of the brain and abdomen   and pelvis and also bone biopsy.  It would be convenient if these could   be obtained prior to discharge, but if not, they will be done on an   outpatient basis.  I will also obtain a brain CT scan.      I sat with Jaima today and oriented her to her diagnosis.  This seems   to be most consistent with a stage IV breast cancer.  She understands   this is not curable with our current knowledge base, but it is treatable   and many patients do very well for many years.  She will need a port   placed to facilitate chemotherapy and surgery has been made aware of   this.  I have also made her an appointment to see me on October 02, 2009, at 4  p.m.               Valentino Hue. Magrinat, M.D.

## 2010-09-09 NOTE — Miscellaneous (Signed)
Summary: MRI Brain Report   MR Brain Without & With Contrast Media - STATUS: Final  IMAGE                                     Perform Date: 9 Feb11 20:35  Ordered By: Juliann Pares,        Ordered Date: 9 Feb11 15:58  Facility: [facility]                              Department: MRI  Service Report Text  [facility] Accession Number: [id number]      Clinical Data: Progressive inflammatory breast cancer.  Left hilar   mass.    MRI HEAD WITHOUT AND WITH CONTRAST    Technique:  Multiplanar, multiecho pulse sequences of the brain and   surrounding structures were obtained according to standard protocol   without and with intravenous contrast    Contrast: At 20 ml Multihance.    Comparison: CT head 09/17/2009.    Findings: The area of potential enhancement in the medial right   parietal lobe is not confirmed on the MRI.  This is likely artifact   on the CT scan.  No acute intracranial abnormalities are present.   No acute infarct, hemorrhage, mass, hydrocephalus, or significant   extra-axial fluid collection is present.    No acute infarct, hemorrhage, mass, hydrocephalus, or significant   extra-axial fluid collection is present.  Flow is present in the   major intracranial arteries.  The globes and orbits are intact.   The paranasal sinuses and mastoid air cells are clear.    IMPRESSION:   Normal MRI of the brain.

## 2010-09-09 NOTE — Miscellaneous (Signed)
Summary: [place] [facility]  [place] [facility] RE[facility]  1. BREAST BX: Invasive Ductal Carcinoma, High Grade. Positive for estrogen receptor and negative or progesterone receptor expression. Demonstrates an unfavorable proliferation rate.   2. T12 VERTEBRAL BX: Metastatic breast cancer.

## 2010-09-23 ENCOUNTER — Other Ambulatory Visit: Payer: Self-pay | Admitting: Oncology

## 2010-09-23 ENCOUNTER — Encounter (HOSPITAL_BASED_OUTPATIENT_CLINIC_OR_DEPARTMENT_OTHER): Payer: Medicaid Other | Admitting: Oncology

## 2010-09-23 DIAGNOSIS — Z23 Encounter for immunization: Secondary | ICD-10-CM

## 2010-09-23 DIAGNOSIS — Z9889 Other specified postprocedural states: Secondary | ICD-10-CM

## 2010-09-23 DIAGNOSIS — C50919 Malignant neoplasm of unspecified site of unspecified female breast: Secondary | ICD-10-CM

## 2010-09-23 DIAGNOSIS — C7952 Secondary malignant neoplasm of bone marrow: Secondary | ICD-10-CM

## 2010-09-23 LAB — CBC WITH DIFFERENTIAL/PLATELET
BASO%: 0.3 % (ref 0.0–2.0)
Basophils Absolute: 0 10*3/uL (ref 0.0–0.1)
EOS%: 1.6 % (ref 0.0–7.0)
HCT: 38.9 % (ref 34.8–46.6)
HGB: 12.9 g/dL (ref 11.6–15.9)
LYMPH%: 15.8 % (ref 14.0–49.7)
MCH: 28.7 pg (ref 25.1–34.0)
MCHC: 33 g/dL (ref 31.5–36.0)
MCV: 87 fL (ref 79.5–101.0)
MONO%: 6.7 % (ref 0.0–14.0)
NEUT%: 75.6 % (ref 38.4–76.8)
Platelets: 224 10*3/uL (ref 145–400)

## 2010-09-23 LAB — COMPREHENSIVE METABOLIC PANEL
AST: 17 U/L (ref 0–37)
Albumin: 4.3 g/dL (ref 3.5–5.2)
Alkaline Phosphatase: 64 U/L (ref 39–117)
BUN: 11 mg/dL (ref 6–23)
Calcium: 9.2 mg/dL (ref 8.4–10.5)
Creatinine, Ser: 1.09 mg/dL (ref 0.40–1.20)
Glucose, Bld: 101 mg/dL — ABNORMAL HIGH (ref 70–99)
Potassium: 4.1 mEq/L (ref 3.5–5.3)

## 2010-09-23 LAB — URINALYSIS, MICROSCOPIC - CHCC
Ketones: NEGATIVE mg/dL
Nitrite: NEGATIVE
Protein: NEGATIVE mg/dL
RBC count: NEGATIVE (ref 0–2)
pH: 5 (ref 4.6–8.0)

## 2010-09-24 LAB — URINE CULTURE

## 2010-10-21 ENCOUNTER — Encounter (HOSPITAL_BASED_OUTPATIENT_CLINIC_OR_DEPARTMENT_OTHER): Payer: Medicaid Other | Admitting: Oncology

## 2010-10-21 ENCOUNTER — Other Ambulatory Visit: Payer: Self-pay | Admitting: Oncology

## 2010-10-21 DIAGNOSIS — C50919 Malignant neoplasm of unspecified site of unspecified female breast: Secondary | ICD-10-CM

## 2010-10-21 DIAGNOSIS — Z23 Encounter for immunization: Secondary | ICD-10-CM

## 2010-10-21 DIAGNOSIS — C7951 Secondary malignant neoplasm of bone: Secondary | ICD-10-CM

## 2010-10-21 LAB — COMPREHENSIVE METABOLIC PANEL
ALT: 13 U/L (ref 0–35)
AST: 15 U/L (ref 0–37)
Albumin: 3.9 g/dL (ref 3.5–5.2)
Alkaline Phosphatase: 55 U/L (ref 39–117)
Potassium: 4 mEq/L (ref 3.5–5.3)
Sodium: 139 mEq/L (ref 135–145)
Total Protein: 6.1 g/dL (ref 6.0–8.3)

## 2010-10-21 LAB — BASIC METABOLIC PANEL
CO2: 27 mEq/L (ref 19–32)
Chloride: 105 mEq/L (ref 96–112)
GFR calc Af Amer: >60 mL/min (ref >60)
Glucose, Bld: 121 mg/dL — ABNORMAL HIGH (ref 70–99)
Potassium: 4.4 mEq/L (ref 3.5–5.1)
Sodium: 137 mEq/L (ref 135–145)

## 2010-10-21 LAB — CBC
HCT: 37 % (ref 36.0–46.0)
Hemoglobin: 12.2 g/dL (ref 12.0–15.0)
MCH: 25.8 pg — ABNORMAL LOW (ref 26.0–34.0)
MCHC: 33 g/dL (ref 30.0–36.0)
MCV: 78.2 fL (ref 78.0–100.0)
RBC: 4.73 MIL/uL (ref 3.87–5.11)

## 2010-10-21 LAB — CBC WITH DIFFERENTIAL/PLATELET
BASO%: 0.2 % (ref 0.0–2.0)
Basophils Absolute: 0 10*3/uL (ref 0.0–0.1)
EOS%: 1.6 % (ref 0.0–7.0)
HCT: 38.2 % (ref 34.8–46.6)
HGB: 12.9 g/dL (ref 11.6–15.9)
LYMPH%: 20.5 % (ref 14.0–49.7)
MCH: 28.2 pg (ref 25.1–34.0)
MCHC: 33.8 g/dL (ref 31.5–36.0)
MONO#: 0.5 10*3/uL (ref 0.1–0.9)
NEUT%: 68.9 % (ref 38.4–76.8)
Platelets: 264 10*3/uL (ref 145–400)
lymph#: 1.3 10*3/uL (ref 0.9–3.3)

## 2010-10-21 LAB — DIFFERENTIAL
Basophils Relative: 0 % (ref 0–1)
Eosinophils Absolute: 0.1 10*3/uL (ref 0.0–0.7)
Lymphs Abs: 0.5 10*3/uL — ABNORMAL LOW (ref 0.7–4.0)
Monocytes Absolute: 0.5 10*3/uL (ref 0.1–1.0)
Monocytes Relative: 8 % (ref 3–12)

## 2010-10-23 LAB — CBC
MCH: 28.2 pg (ref 26.0–34.0)
MCHC: 32.3 g/dL (ref 30.0–36.0)
MCV: 87.3 fL (ref 78.0–100.0)
Platelets: 195 10*3/uL (ref 150–400)

## 2010-10-23 LAB — BASIC METABOLIC PANEL
CO2: 29 mEq/L (ref 19–32)
Calcium: 9.6 mg/dL (ref 8.4–10.5)
Chloride: 105 mEq/L (ref 96–112)
Creatinine, Ser: 0.94 mg/dL (ref 0.4–1.2)
Glucose, Bld: 122 mg/dL — ABNORMAL HIGH (ref 70–99)

## 2010-10-23 LAB — SURGICAL PCR SCREEN: Staphylococcus aureus: NEGATIVE

## 2010-10-26 LAB — COMPREHENSIVE METABOLIC PANEL
AST: 25 U/L (ref 0–37)
CO2: 26 mEq/L (ref 19–32)
Calcium: 9.2 mg/dL (ref 8.4–10.5)
Creatinine, Ser: 0.68 mg/dL (ref 0.4–1.2)
GFR calc Af Amer: >60 mL/min (ref >60)
GFR calc non Af Amer: >60 mL/min (ref >60)
Glucose, Bld: 174 mg/dL — ABNORMAL HIGH (ref 70–99)
Total Protein: 7 g/dL (ref 6.0–8.3)

## 2010-10-26 LAB — LIPASE, BLOOD: Lipase: 17 U/L (ref 11–59)

## 2010-10-26 LAB — CBC
Hemoglobin: 10.9 g/dL — ABNORMAL LOW (ref 12.0–15.0)
MCHC: 32.7 g/dL (ref 30.0–36.0)
MCV: 89.4 fL (ref 78.0–100.0)
RDW: 22.8 % — ABNORMAL HIGH (ref 11.5–15.5)

## 2010-10-26 LAB — DIFFERENTIAL
Basophils Absolute: 0 10*3/uL (ref 0.0–0.1)
Eosinophils Relative: 0 % (ref 0–5)
Lymphocytes Relative: 2 % — ABNORMAL LOW (ref 12–46)
Monocytes Relative: 3 % (ref 3–12)

## 2010-10-28 LAB — CBC
HCT: 38.6 % (ref 36.0–46.0)
Hemoglobin: 12.1 g/dL (ref 12.0–15.0)
Hemoglobin: 13.1 g/dL (ref 12.0–15.0)
MCHC: 33.1 g/dL (ref 30.0–36.0)
MCV: 84.2 fL (ref 78.0–100.0)
RBC: 4.36 MIL/uL (ref 3.87–5.11)
RBC: 4.62 MIL/uL (ref 3.87–5.11)
WBC: 8.6 10*3/uL (ref 4.0–10.5)

## 2010-10-28 LAB — BASIC METABOLIC PANEL
CO2: 29 mEq/L (ref 19–32)
Chloride: 105 mEq/L (ref 96–112)
Chloride: 99 mEq/L (ref 96–112)
Creatinine, Ser: 1.11 mg/dL (ref 0.4–1.2)
GFR calc Af Amer: >60 mL/min (ref >60)
GFR calc non Af Amer: >60 mL/min (ref >60)
Potassium: 4.1 mEq/L (ref 3.5–5.1)
Sodium: 132 mEq/L — ABNORMAL LOW (ref 135–145)
Sodium: 139 mEq/L (ref 135–145)

## 2010-10-28 LAB — COMPREHENSIVE METABOLIC PANEL
ALT: 18 U/L (ref 0–35)
AST: 17 U/L (ref 0–37)
Albumin: 4 g/dL (ref 3.5–5.2)
Alkaline Phosphatase: 71 U/L (ref 39–117)
BUN: 10 mg/dL (ref 6–23)
CO2: 30 mEq/L (ref 19–32)
Calcium: 9.6 mg/dL (ref 8.4–10.5)
Chloride: 99 mEq/L (ref 96–112)
Creatinine, Ser: 1.03 mg/dL (ref 0.4–1.2)
GFR calc Af Amer: >60 mL/min (ref >60)
GFR calc non Af Amer: 58 mL/min — ABNORMAL LOW (ref >60)
Glucose, Bld: 138 mg/dL — ABNORMAL HIGH (ref 70–99)
Potassium: 3.7 mEq/L (ref 3.5–5.1)
Sodium: 136 mEq/L (ref 135–145)
Total Bilirubin: 0.5 mg/dL (ref 0.3–1.2)
Total Protein: 8.1 g/dL (ref 6.0–8.3)

## 2010-10-28 LAB — CANCER ANTIGEN 27.29: CA 27.29: 30 U/mL (ref 0–39)

## 2010-10-28 LAB — DIFFERENTIAL
Eosinophils Relative: 2 % (ref 0–5)
Lymphocytes Relative: 27 % (ref 12–46)
Lymphs Abs: 2.4 10*3/uL (ref 0.7–4.0)
Monocytes Absolute: 0.7 10*3/uL (ref 0.1–1.0)
Monocytes Relative: 8 % (ref 3–12)

## 2010-10-28 LAB — TSH: TSH: 24.192 u[IU]/mL — ABNORMAL HIGH (ref 0.350–4.500)

## 2010-10-28 LAB — PROTIME-INR: INR: 1.03 (ref 0.00–1.49)

## 2010-11-01 LAB — CBC
HCT: 39.2 % (ref 36.0–46.0)
Hemoglobin: 12.9 g/dL (ref 12.0–15.0)
MCV: 84.4 fL (ref 78.0–100.0)
RBC: 4.65 MIL/uL (ref 3.87–5.11)
WBC: 10 10*3/uL (ref 4.0–10.5)

## 2010-11-01 LAB — POCT I-STAT, CHEM 8
BUN: 14 mg/dL (ref 6–23)
Calcium, Ion: 1 mmol/L — ABNORMAL LOW (ref 1.12–1.32)
Creatinine, Ser: 0.7 mg/dL (ref 0.4–1.2)
TCO2: 28 mmol/L (ref 0–100)

## 2010-11-01 LAB — DIFFERENTIAL
Eosinophils Relative: 0 % (ref 0–5)
Lymphs Abs: 0.3 10*3/uL — ABNORMAL LOW (ref 0.7–4.0)
Monocytes Relative: 8 % (ref 3–12)
Neutro Abs: 8.9 10*3/uL — ABNORMAL HIGH (ref 1.7–7.7)

## 2010-11-02 LAB — DIFFERENTIAL
Basophils Absolute: 0 10*3/uL (ref 0.0–0.1)
Eosinophils Relative: 0 % (ref 0–5)
Lymphocytes Relative: 1 % — ABNORMAL LOW (ref 12–46)
Lymphs Abs: 0.3 10*3/uL — ABNORMAL LOW (ref 0.7–4.0)
Monocytes Relative: 3 % (ref 3–12)

## 2010-11-02 LAB — URINALYSIS, ROUTINE W REFLEX MICROSCOPIC
Leukocytes, UA: NEGATIVE
Nitrite: NEGATIVE
Protein, ur: 30 mg/dL — AB
Specific Gravity, Urine: 1.04 — ABNORMAL HIGH (ref 1.005–1.030)
Urobilinogen, UA: 1 mg/dL (ref 0.0–1.0)

## 2010-11-02 LAB — URINE MICROSCOPIC-ADD ON

## 2010-11-02 LAB — COMPREHENSIVE METABOLIC PANEL
AST: 17 U/L (ref 0–37)
Albumin: 3.2 g/dL — ABNORMAL LOW (ref 3.5–5.2)
BUN: 15 mg/dL (ref 6–23)
BUN: 6 mg/dL (ref 6–23)
CO2: 26 mEq/L (ref 19–32)
Calcium: 7.6 mg/dL — ABNORMAL LOW (ref 8.4–10.5)
Calcium: 8.5 mg/dL (ref 8.4–10.5)
Chloride: 105 mEq/L (ref 96–112)
Creatinine, Ser: 0.63 mg/dL (ref 0.4–1.2)
Creatinine, Ser: 0.84 mg/dL (ref 0.4–1.2)
GFR calc Af Amer: >60 mL/min (ref >60)
GFR calc non Af Amer: >60 mL/min (ref >60)
Total Bilirubin: 0.7 mg/dL (ref 0.3–1.2)

## 2010-11-02 LAB — CBC
HCT: 35 % — ABNORMAL LOW (ref 36.0–46.0)
HCT: 36.6 % (ref 36.0–46.0)
MCHC: 33.5 g/dL (ref 30.0–36.0)
MCHC: 34.3 g/dL (ref 30.0–36.0)
MCV: 84 fL (ref 78.0–100.0)
MCV: 84.3 fL (ref 78.0–100.0)
Platelets: 163 10*3/uL (ref 150–400)
Platelets: 69 10*3/uL — ABNORMAL LOW (ref 150–400)
RBC: 4.36 MIL/uL (ref 3.87–5.11)
RDW: 17.5 % — ABNORMAL HIGH (ref 11.5–15.5)
WBC: 20.1 10*3/uL — ABNORMAL HIGH (ref 4.0–10.5)

## 2010-11-03 ENCOUNTER — Encounter (HOSPITAL_BASED_OUTPATIENT_CLINIC_OR_DEPARTMENT_OTHER): Payer: Medicaid Other | Admitting: Oncology

## 2010-11-03 ENCOUNTER — Other Ambulatory Visit: Payer: Self-pay | Admitting: Oncology

## 2010-11-03 DIAGNOSIS — C7952 Secondary malignant neoplasm of bone marrow: Secondary | ICD-10-CM

## 2010-11-03 DIAGNOSIS — C50919 Malignant neoplasm of unspecified site of unspecified female breast: Secondary | ICD-10-CM

## 2010-11-03 LAB — CBC WITH DIFFERENTIAL/PLATELET
Basophils Absolute: 0 10*3/uL (ref 0.0–0.1)
Eosinophils Absolute: 0.1 10*3/uL (ref 0.0–0.5)
HGB: 13.2 g/dL (ref 11.6–15.9)
MONO#: 0.5 10*3/uL (ref 0.1–0.9)
NEUT#: 3.8 10*3/uL (ref 1.5–6.5)
Platelets: 245 10*3/uL (ref 145–400)
RBC: 4.69 10*6/uL (ref 3.70–5.45)
RDW: 16.2 % — ABNORMAL HIGH (ref 11.2–14.5)
WBC: 5.4 10*3/uL (ref 3.9–10.3)
nRBC: 0 % (ref 0–0)

## 2010-11-09 LAB — URINALYSIS, ROUTINE W REFLEX MICROSCOPIC
Nitrite: NEGATIVE
Specific Gravity, Urine: 1.027 (ref 1.005–1.030)
pH: 5.5 (ref 5.0–8.0)

## 2010-11-18 ENCOUNTER — Other Ambulatory Visit: Payer: Self-pay | Admitting: Oncology

## 2010-11-18 ENCOUNTER — Encounter (HOSPITAL_BASED_OUTPATIENT_CLINIC_OR_DEPARTMENT_OTHER): Payer: Medicaid Other | Admitting: Oncology

## 2010-11-18 DIAGNOSIS — Z23 Encounter for immunization: Secondary | ICD-10-CM

## 2010-11-18 DIAGNOSIS — C50919 Malignant neoplasm of unspecified site of unspecified female breast: Secondary | ICD-10-CM

## 2010-11-18 DIAGNOSIS — C7952 Secondary malignant neoplasm of bone marrow: Secondary | ICD-10-CM

## 2010-11-18 DIAGNOSIS — C7951 Secondary malignant neoplasm of bone: Secondary | ICD-10-CM

## 2010-11-18 LAB — COMPREHENSIVE METABOLIC PANEL
Albumin: 3.9 g/dL (ref 3.5–5.2)
Alkaline Phosphatase: 53 U/L (ref 39–117)
CO2: 25 mEq/L (ref 19–32)
Glucose, Bld: 88 mg/dL (ref 70–99)
Potassium: 3.9 mEq/L (ref 3.5–5.3)
Sodium: 142 mEq/L (ref 135–145)
Total Protein: 6.2 g/dL (ref 6.0–8.3)

## 2010-11-18 LAB — CBC WITH DIFFERENTIAL/PLATELET
Basophils Absolute: 0 10*3/uL (ref 0.0–0.1)
EOS%: 1.7 % (ref 0.0–7.0)
Eosinophils Absolute: 0.1 10*3/uL (ref 0.0–0.5)
HGB: 13.6 g/dL (ref 11.6–15.9)
MONO#: 0.4 10*3/uL (ref 0.1–0.9)
NEUT#: 3.9 10*3/uL (ref 1.5–6.5)
RDW: 16.2 % — ABNORMAL HIGH (ref 11.2–14.5)
WBC: 5.4 10*3/uL (ref 3.9–10.3)
lymph#: 0.9 10*3/uL (ref 0.9–3.3)

## 2010-11-18 LAB — TSH: TSH: 2.557 u[IU]/mL (ref 0.350–4.500)

## 2010-11-23 ENCOUNTER — Ambulatory Visit
Admission: RE | Admit: 2010-11-23 | Discharge: 2010-11-23 | Disposition: A | Discharge status: Home / Self Care | Payer: Medicaid Other | Source: Ambulatory Visit | Attending: Oncology | Admitting: Oncology

## 2010-11-23 ENCOUNTER — Other Ambulatory Visit: Payer: Self-pay | Admitting: Oncology

## 2010-11-23 DIAGNOSIS — Z9889 Other specified postprocedural states: Secondary | ICD-10-CM

## 2010-11-23 LAB — URINALYSIS, ROUTINE W REFLEX MICROSCOPIC
Bilirubin Urine: NEGATIVE
Ketones, ur: NEGATIVE mg/dL
Nitrite: NEGATIVE
pH: 6 (ref 5.0–8.0)

## 2010-11-23 LAB — POCT PREGNANCY, URINE: Preg Test, Ur: NEGATIVE

## 2010-11-23 LAB — URINE MICROSCOPIC-ADD ON

## 2010-11-24 ENCOUNTER — Other Ambulatory Visit (HOSPITAL_COMMUNITY): Payer: Medicaid Other

## 2010-11-25 ENCOUNTER — Other Ambulatory Visit: Payer: Self-pay | Admitting: Diagnostic Radiology

## 2010-11-25 ENCOUNTER — Ambulatory Visit
Admission: RE | Admit: 2010-11-25 | Discharge: 2010-11-25 | Disposition: A | Discharge status: Home / Self Care | Payer: Medicaid Other | Source: Ambulatory Visit | Attending: Oncology | Admitting: Oncology

## 2010-11-25 ENCOUNTER — Other Ambulatory Visit: Payer: Self-pay | Admitting: Oncology

## 2010-11-25 DIAGNOSIS — Z9889 Other specified postprocedural states: Secondary | ICD-10-CM

## 2010-11-26 ENCOUNTER — Other Ambulatory Visit (HOSPITAL_COMMUNITY): Payer: Medicaid Other

## 2010-12-16 ENCOUNTER — Encounter (HOSPITAL_BASED_OUTPATIENT_CLINIC_OR_DEPARTMENT_OTHER): Payer: Self-pay | Admitting: Oncology

## 2010-12-16 ENCOUNTER — Other Ambulatory Visit: Payer: Self-pay | Admitting: Oncology

## 2010-12-16 DIAGNOSIS — C7951 Secondary malignant neoplasm of bone: Secondary | ICD-10-CM

## 2010-12-16 DIAGNOSIS — C50919 Malignant neoplasm of unspecified site of unspecified female breast: Secondary | ICD-10-CM

## 2010-12-16 DIAGNOSIS — M542 Cervicalgia: Secondary | ICD-10-CM

## 2010-12-16 DIAGNOSIS — R630 Anorexia: Secondary | ICD-10-CM

## 2010-12-16 DIAGNOSIS — Z23 Encounter for immunization: Secondary | ICD-10-CM

## 2010-12-16 DIAGNOSIS — M549 Dorsalgia, unspecified: Secondary | ICD-10-CM

## 2010-12-16 LAB — COMPREHENSIVE METABOLIC PANEL
ALT: 12 U/L (ref 0–35)
AST: 19 U/L (ref 0–37)
CO2: 23 mEq/L (ref 19–32)
Calcium: 9.2 mg/dL (ref 8.4–10.5)
Chloride: 103 mEq/L (ref 96–112)
Sodium: 137 mEq/L (ref 135–145)
Total Protein: 6.6 g/dL (ref 6.0–8.3)

## 2010-12-16 LAB — CBC WITH DIFFERENTIAL/PLATELET
Eosinophils Absolute: 0.1 10*3/uL (ref 0.0–0.5)
MONO#: 0.6 10*3/uL (ref 0.1–0.9)
NEUT#: 5.1 10*3/uL (ref 1.5–6.5)
Platelets: 229 10*3/uL (ref 145–400)
RBC: 4.93 10*6/uL (ref 3.70–5.45)
RDW: 16.3 % — ABNORMAL HIGH (ref 11.2–14.5)
WBC: 6.8 10*3/uL (ref 3.9–10.3)
lymph#: 1 10*3/uL (ref 0.9–3.3)
nRBC: 0 % (ref 0–0)

## 2010-12-16 LAB — CANCER ANTIGEN 27.29: CA 27.29: 16 U/mL (ref 0–39)

## 2010-12-25 ENCOUNTER — Other Ambulatory Visit (HOSPITAL_COMMUNITY): Payer: Self-pay

## 2010-12-25 ENCOUNTER — Inpatient Hospital Stay (HOSPITAL_COMMUNITY): Admission: RE | Admit: 2010-12-25 | Payer: Self-pay | Source: Ambulatory Visit

## 2011-01-12 ENCOUNTER — Other Ambulatory Visit: Payer: Self-pay | Admitting: Oncology

## 2011-01-12 ENCOUNTER — Encounter (HOSPITAL_BASED_OUTPATIENT_CLINIC_OR_DEPARTMENT_OTHER): Payer: Self-pay | Admitting: Oncology

## 2011-01-12 DIAGNOSIS — C7952 Secondary malignant neoplasm of bone marrow: Secondary | ICD-10-CM

## 2011-01-12 DIAGNOSIS — Z23 Encounter for immunization: Secondary | ICD-10-CM

## 2011-01-12 DIAGNOSIS — C50919 Malignant neoplasm of unspecified site of unspecified female breast: Secondary | ICD-10-CM

## 2011-01-12 LAB — COMPREHENSIVE METABOLIC PANEL
AST: 25 U/L (ref 0–37)
Albumin: 4.3 g/dL (ref 3.5–5.2)
Alkaline Phosphatase: 54 U/L (ref 39–117)
BUN: 14 mg/dL (ref 6–23)
Glucose, Bld: 80 mg/dL (ref 70–99)
Potassium: 4 mEq/L (ref 3.5–5.3)
Total Bilirubin: 0.3 mg/dL (ref 0.3–1.2)

## 2011-01-12 LAB — CBC WITH DIFFERENTIAL/PLATELET
BASO%: 0.3 % (ref 0.0–2.0)
EOS%: 2.4 % (ref 0.0–7.0)
HCT: 37.7 % (ref 34.8–46.6)
MCH: 27.8 pg (ref 25.1–34.0)
MCHC: 33.2 g/dL (ref 31.5–36.0)
MCV: 83.8 fL (ref 79.5–101.0)
MONO%: 9.3 % (ref 0.0–14.0)
NEUT%: 67.2 % (ref 38.4–76.8)
RDW: 16.8 % — ABNORMAL HIGH (ref 11.2–14.5)
lymph#: 1.2 10*3/uL (ref 0.9–3.3)

## 2011-01-12 LAB — CANCER ANTIGEN 27.29: CA 27.29: 14 U/mL (ref 0–39)

## 2011-02-01 ENCOUNTER — Encounter (HOSPITAL_BASED_OUTPATIENT_CLINIC_OR_DEPARTMENT_OTHER): Payer: Self-pay | Admitting: Oncology

## 2011-02-01 ENCOUNTER — Other Ambulatory Visit: Payer: Self-pay | Admitting: Oncology

## 2011-02-01 DIAGNOSIS — C50919 Malignant neoplasm of unspecified site of unspecified female breast: Secondary | ICD-10-CM

## 2011-02-01 DIAGNOSIS — R630 Anorexia: Secondary | ICD-10-CM

## 2011-02-01 DIAGNOSIS — C7951 Secondary malignant neoplasm of bone: Secondary | ICD-10-CM

## 2011-02-01 LAB — COMPREHENSIVE METABOLIC PANEL
ALT: 16 U/L (ref 0–35)
AST: 16 U/L (ref 0–37)
Albumin: 3.9 g/dL (ref 3.5–5.2)
CO2: 26 mEq/L (ref 19–32)
Calcium: 9.3 mg/dL (ref 8.4–10.5)
Chloride: 108 mEq/L (ref 96–112)
Potassium: 4.2 mEq/L (ref 3.5–5.3)
Sodium: 143 mEq/L (ref 135–145)
Total Protein: 6.2 g/dL (ref 6.0–8.3)

## 2011-02-01 LAB — PREGNANCY, URINE: Preg Test, Ur: NEGATIVE

## 2011-02-01 LAB — CBC WITH DIFFERENTIAL/PLATELET
BASO%: 0.3 % (ref 0.0–2.0)
Eosinophils Absolute: 0.1 10*3/uL (ref 0.0–0.5)
LYMPH%: 20.1 % (ref 14.0–49.7)
MCHC: 33.2 g/dL (ref 31.5–36.0)
MONO#: 0.3 10*3/uL (ref 0.1–0.9)
NEUT#: 3.3 10*3/uL (ref 1.5–6.5)
Platelets: 248 10*3/uL (ref 145–400)
RBC: 4.01 10*6/uL (ref 3.70–5.45)
WBC: 4.6 10*3/uL (ref 3.9–10.3)
lymph#: 0.9 10*3/uL (ref 0.9–3.3)

## 2011-02-08 ENCOUNTER — Ambulatory Visit (HOSPITAL_COMMUNITY)
Admission: RE | Admit: 2011-02-08 | Discharge: 2011-02-08 | Disposition: A | Discharge status: Home / Self Care | Payer: Self-pay | Source: Ambulatory Visit | Attending: Oncology | Admitting: Oncology

## 2011-02-08 DIAGNOSIS — C50919 Malignant neoplasm of unspecified site of unspecified female breast: Secondary | ICD-10-CM

## 2011-02-08 DIAGNOSIS — M47812 Spondylosis without myelopathy or radiculopathy, cervical region: Secondary | ICD-10-CM | POA: Insufficient documentation

## 2011-02-08 DIAGNOSIS — C7951 Secondary malignant neoplasm of bone: Secondary | ICD-10-CM | POA: Insufficient documentation

## 2011-02-08 MED ORDER — GADOBENATE DIMEGLUMINE 529 MG/ML IV SOLN
20.0000 mL | Freq: Once | INTRAVENOUS | Status: AC | PRN
  Administered 2011-02-08: 20 mL via INTRAVENOUS

## 2011-02-11 ENCOUNTER — Other Ambulatory Visit: Payer: Self-pay | Admitting: Oncology

## 2011-02-11 ENCOUNTER — Encounter (HOSPITAL_BASED_OUTPATIENT_CLINIC_OR_DEPARTMENT_OTHER): Payer: Self-pay | Admitting: Oncology

## 2011-02-11 DIAGNOSIS — R630 Anorexia: Secondary | ICD-10-CM

## 2011-02-11 DIAGNOSIS — R110 Nausea: Secondary | ICD-10-CM

## 2011-02-11 DIAGNOSIS — C50919 Malignant neoplasm of unspecified site of unspecified female breast: Secondary | ICD-10-CM

## 2011-02-11 DIAGNOSIS — C7952 Secondary malignant neoplasm of bone marrow: Secondary | ICD-10-CM

## 2011-02-11 DIAGNOSIS — C7951 Secondary malignant neoplasm of bone: Secondary | ICD-10-CM

## 2011-02-11 DIAGNOSIS — Z23 Encounter for immunization: Secondary | ICD-10-CM

## 2011-02-11 LAB — CBC WITH DIFFERENTIAL/PLATELET
Basophils Absolute: 0 10*3/uL (ref 0.0–0.1)
Eosinophils Absolute: 0.1 10*3/uL (ref 0.0–0.5)
HCT: 35 % (ref 34.8–46.6)
LYMPH%: 20.1 % (ref 14.0–49.7)
MCV: 83.5 fL (ref 79.5–101.0)
MONO%: 8.9 % (ref 0.0–14.0)
NEUT#: 4.4 10*3/uL (ref 1.5–6.5)
NEUT%: 69.1 % (ref 38.4–76.8)
Platelets: 226 10*3/uL (ref 145–400)
RBC: 4.19 10*6/uL (ref 3.70–5.45)

## 2011-02-15 ENCOUNTER — Other Ambulatory Visit: Payer: Self-pay | Admitting: Oncology

## 2011-02-15 ENCOUNTER — Encounter (HOSPITAL_BASED_OUTPATIENT_CLINIC_OR_DEPARTMENT_OTHER): Payer: Self-pay | Admitting: Oncology

## 2011-02-15 DIAGNOSIS — R630 Anorexia: Secondary | ICD-10-CM

## 2011-02-15 DIAGNOSIS — C7952 Secondary malignant neoplasm of bone marrow: Secondary | ICD-10-CM

## 2011-02-15 DIAGNOSIS — C50919 Malignant neoplasm of unspecified site of unspecified female breast: Secondary | ICD-10-CM

## 2011-02-15 LAB — COMPREHENSIVE METABOLIC PANEL
ALT: 24 U/L (ref 0–35)
AST: 23 U/L (ref 0–37)
Albumin: 4.2 g/dL (ref 3.5–5.2)
Alkaline Phosphatase: 59 U/L (ref 39–117)
CO2: 28 mEq/L (ref 19–32)
Calcium: 9.6 mg/dL (ref 8.4–10.5)
Chloride: 107 mEq/L (ref 96–112)
Creatinine, Ser: 1.2 mg/dL — ABNORMAL HIGH (ref 0.50–1.10)
Potassium: 4.1 mEq/L (ref 3.5–5.3)
Sodium: 142 mEq/L (ref 135–145)
Total Protein: 7 g/dL (ref 6.0–8.3)

## 2011-02-17 ENCOUNTER — Encounter (INDEPENDENT_AMBULATORY_CARE_PROVIDER_SITE_OTHER): Payer: Self-pay | Admitting: Surgery

## 2011-02-22 ENCOUNTER — Telehealth (INDEPENDENT_AMBULATORY_CARE_PROVIDER_SITE_OTHER): Payer: Self-pay

## 2011-02-22 NOTE — Telephone Encounter (Signed)
Patient's sister called and requested a soon appt this week with Dr Luisa Hart.  She is not clear the problem the patient is having but thinks it is a lump under her arm. The patient has had breast cancer surgery by Dr Luisa Hart in the past.  She wants to be seen sooner than August 9th.  Please call re: getting her in

## 2011-02-23 NOTE — Telephone Encounter (Signed)
appt made with dr. Karie Schwalbe. cornett on 02-26-11/ 1 spoke with pt's sister pam

## 2011-02-25 ENCOUNTER — Encounter (INDEPENDENT_AMBULATORY_CARE_PROVIDER_SITE_OTHER): Payer: Self-pay | Admitting: General Surgery

## 2011-02-26 ENCOUNTER — Ambulatory Visit (INDEPENDENT_AMBULATORY_CARE_PROVIDER_SITE_OTHER): Payer: Self-pay | Admitting: Surgery

## 2011-02-26 ENCOUNTER — Encounter (INDEPENDENT_AMBULATORY_CARE_PROVIDER_SITE_OTHER): Payer: Self-pay | Admitting: Surgery

## 2011-02-26 VITALS — BP 134/92 | HR 76 | Temp 96.2°F | Ht 69.0 in | Wt 159.0 lb

## 2011-02-26 DIAGNOSIS — C800 Disseminated malignant neoplasm, unspecified: Secondary | ICD-10-CM

## 2011-02-26 DIAGNOSIS — L732 Hidradenitis suppurativa: Secondary | ICD-10-CM

## 2011-02-26 DIAGNOSIS — C50919 Malignant neoplasm of unspecified site of unspecified female breast: Secondary | ICD-10-CM | POA: Insufficient documentation

## 2011-02-26 NOTE — Patient Instructions (Signed)
Follow up in 3 months

## 2011-02-26 NOTE — Progress Notes (Signed)
Subjective:     Patient ID: Kristin [name], female   DOB: [birth year], 45 y.o.   MRN: [id number]  HPI The patient returns today for followup of her bilateral axillary hidradenitis. She is having no pain, drainage, or redness in either area. She is completed no therapy now for breast cancer. She does have stage IV breast cancer. She denies any fever chills or malaise.   Review of Systems  Constitutional: Negative.   HENT: Negative.   Respiratory: Negative.   Cardiovascular: Negative.   Gastrointestinal: Negative.   Genitourinary: Negative.   Musculoskeletal: Negative.   Neurological: Negative.   Hematological: Negative.   Psychiatric/Behavioral: Negative.        Objective:   Physical Exam  Constitutional: She appears well-developed and well-nourished.  HENT:  Head: Normocephalic and atraumatic.  Nose: Nose normal.  Neck: Normal range of motion. Neck supple.  Pulmonary/Chest:       Scar to left breast well healed. No signs of seroma or infection the left breast.  Skin:       Evidence of bilateral hidradenitis to the axilla noted. No redness, drainage or discomfort to palpation.       Assessment:     Bilateral hidradenitis axilla    Plan:     Her hidradenitis is stable currently. She is not undergoing any treatment for breast cancer at this point in time. I will see her back in 3 months to follow these areas. She has no signs of any acute flareup. I have recommended wide excision of these areas to prevent recurrence. She has no interest in surgery currently. I told her if the area is red, drain or become very painful to call me.

## 2011-03-03 ENCOUNTER — Other Ambulatory Visit (HOSPITAL_COMMUNITY): Payer: Self-pay

## 2011-03-03 ENCOUNTER — Encounter (HOSPITAL_COMMUNITY): Payer: Self-pay

## 2011-03-03 ENCOUNTER — Ambulatory Visit (HOSPITAL_COMMUNITY)
Admission: RE | Admit: 2011-03-03 | Discharge: 2011-03-03 | Disposition: A | Discharge status: Home / Self Care | Payer: Self-pay | Source: Ambulatory Visit | Attending: Oncology | Admitting: Oncology

## 2011-03-03 DIAGNOSIS — C7952 Secondary malignant neoplasm of bone marrow: Secondary | ICD-10-CM | POA: Insufficient documentation

## 2011-03-03 DIAGNOSIS — M8448XA Pathological fracture, other site, initial encounter for fracture: Secondary | ICD-10-CM | POA: Insufficient documentation

## 2011-03-03 DIAGNOSIS — C50919 Malignant neoplasm of unspecified site of unspecified female breast: Secondary | ICD-10-CM | POA: Insufficient documentation

## 2011-03-03 DIAGNOSIS — C7951 Secondary malignant neoplasm of bone: Secondary | ICD-10-CM | POA: Insufficient documentation

## 2011-03-03 DIAGNOSIS — J479 Bronchiectasis, uncomplicated: Secondary | ICD-10-CM | POA: Insufficient documentation

## 2011-03-03 HISTORY — DX: Essential (primary) hypertension: I10

## 2011-03-03 MED ORDER — IOHEXOL 300 MG/ML  SOLN
100.0000 mL | Freq: Once | INTRAMUSCULAR | Status: AC | PRN
  Administered 2011-03-03: 100 mL via INTRAVENOUS

## 2011-03-10 ENCOUNTER — Other Ambulatory Visit: Payer: Self-pay | Admitting: Oncology

## 2011-03-10 ENCOUNTER — Encounter (HOSPITAL_BASED_OUTPATIENT_CLINIC_OR_DEPARTMENT_OTHER): Payer: Self-pay | Admitting: Oncology

## 2011-03-10 DIAGNOSIS — G893 Neoplasm related pain (acute) (chronic): Secondary | ICD-10-CM

## 2011-03-10 DIAGNOSIS — R630 Anorexia: Secondary | ICD-10-CM

## 2011-03-10 DIAGNOSIS — C7952 Secondary malignant neoplasm of bone marrow: Secondary | ICD-10-CM

## 2011-03-10 DIAGNOSIS — C50919 Malignant neoplasm of unspecified site of unspecified female breast: Secondary | ICD-10-CM

## 2011-03-10 LAB — CBC WITH DIFFERENTIAL/PLATELET
BASO%: 0.2 % (ref 0.0–2.0)
EOS%: 1.4 % (ref 0.0–7.0)
HCT: 39.8 % (ref 34.8–46.6)
LYMPH%: 18.6 % (ref 14.0–49.7)
MCH: 28.3 pg (ref 25.1–34.0)
MCHC: 33.7 g/dL (ref 31.5–36.0)
MCV: 84 fL (ref 79.5–101.0)
MONO#: 0.5 10*3/uL (ref 0.1–0.9)
MONO%: 8.5 % (ref 0.0–14.0)
NEUT%: 71.3 % (ref 38.4–76.8)
Platelets: 211 10*3/uL (ref 145–400)

## 2011-03-10 LAB — COMPREHENSIVE METABOLIC PANEL
AST: 22 U/L (ref 0–37)
BUN: 13 mg/dL (ref 6–23)
Calcium: 9.6 mg/dL (ref 8.4–10.5)
Chloride: 105 mEq/L (ref 96–112)
Creatinine, Ser: 0.94 mg/dL (ref 0.50–1.10)
Total Bilirubin: 0.5 mg/dL (ref 0.3–1.2)

## 2011-03-13 LAB — CELL SEARCH FOR BREAST CANCER

## 2011-03-23 ENCOUNTER — Encounter (HOSPITAL_BASED_OUTPATIENT_CLINIC_OR_DEPARTMENT_OTHER): Payer: Self-pay | Admitting: Oncology

## 2011-03-23 DIAGNOSIS — C7951 Secondary malignant neoplasm of bone: Secondary | ICD-10-CM

## 2011-03-23 DIAGNOSIS — C50919 Malignant neoplasm of unspecified site of unspecified female breast: Secondary | ICD-10-CM

## 2011-03-23 DIAGNOSIS — R630 Anorexia: Secondary | ICD-10-CM

## 2011-03-23 DIAGNOSIS — G893 Neoplasm related pain (acute) (chronic): Secondary | ICD-10-CM

## 2011-04-07 ENCOUNTER — Encounter (HOSPITAL_BASED_OUTPATIENT_CLINIC_OR_DEPARTMENT_OTHER): Payer: Self-pay | Admitting: Oncology

## 2011-04-07 ENCOUNTER — Other Ambulatory Visit: Payer: Self-pay | Admitting: Oncology

## 2011-04-07 DIAGNOSIS — R630 Anorexia: Secondary | ICD-10-CM

## 2011-04-07 DIAGNOSIS — C50919 Malignant neoplasm of unspecified site of unspecified female breast: Secondary | ICD-10-CM

## 2011-04-07 DIAGNOSIS — C7951 Secondary malignant neoplasm of bone: Secondary | ICD-10-CM

## 2011-04-07 LAB — CBC WITH DIFFERENTIAL/PLATELET
BASO%: 0.2 % (ref 0.0–2.0)
HCT: 38.8 % (ref 34.8–46.6)
MCHC: 33.2 g/dL (ref 31.5–36.0)
MONO#: 0.4 10*3/uL (ref 0.1–0.9)
NEUT%: 64.8 % (ref 38.4–76.8)
RBC: 4.64 10*6/uL (ref 3.70–5.45)
WBC: 4.9 10*3/uL (ref 3.9–10.3)
lymph#: 1.2 10*3/uL (ref 0.9–3.3)

## 2011-04-07 LAB — COMPREHENSIVE METABOLIC PANEL
ALT: 14 U/L (ref 0–35)
CO2: 24 mEq/L (ref 19–32)
Calcium: 9.1 mg/dL (ref 8.4–10.5)
Chloride: 106 mEq/L (ref 96–112)
Sodium: 140 mEq/L (ref 135–145)
Total Protein: 6.4 g/dL (ref 6.0–8.3)

## 2011-05-05 ENCOUNTER — Encounter (HOSPITAL_BASED_OUTPATIENT_CLINIC_OR_DEPARTMENT_OTHER): Payer: No Typology Code available for payment source | Admitting: Oncology

## 2011-05-05 ENCOUNTER — Other Ambulatory Visit: Payer: Self-pay | Admitting: Oncology

## 2011-05-05 DIAGNOSIS — C7952 Secondary malignant neoplasm of bone marrow: Secondary | ICD-10-CM

## 2011-05-05 DIAGNOSIS — C50919 Malignant neoplasm of unspecified site of unspecified female breast: Secondary | ICD-10-CM

## 2011-05-05 DIAGNOSIS — R630 Anorexia: Secondary | ICD-10-CM

## 2011-05-05 LAB — CBC WITH DIFFERENTIAL/PLATELET
BASO%: 0.2 % (ref 0.0–2.0)
LYMPH%: 17 % (ref 14.0–49.7)
MCHC: 33.2 g/dL (ref 31.5–36.0)
MONO#: 0.5 10*3/uL (ref 0.1–0.9)
RBC: 4.21 10*6/uL (ref 3.70–5.45)
WBC: 6.3 10*3/uL (ref 3.9–10.3)
lymph#: 1.1 10*3/uL (ref 0.9–3.3)
nRBC: 0 % (ref 0–0)

## 2011-05-21 ENCOUNTER — Emergency Department (HOSPITAL_COMMUNITY)
Admission: EM | Admit: 2011-05-21 | Discharge: 2011-05-21 | Disposition: A | Discharge status: Home / Self Care | Payer: Medicaid Other | Attending: Emergency Medicine | Admitting: Emergency Medicine

## 2011-05-21 ENCOUNTER — Emergency Department (HOSPITAL_COMMUNITY): Payer: Medicaid Other

## 2011-05-21 DIAGNOSIS — C50919 Malignant neoplasm of unspecified site of unspecified female breast: Secondary | ICD-10-CM | POA: Insufficient documentation

## 2011-05-21 DIAGNOSIS — M545 Low back pain, unspecified: Secondary | ICD-10-CM | POA: Insufficient documentation

## 2011-05-21 DIAGNOSIS — C7951 Secondary malignant neoplasm of bone: Secondary | ICD-10-CM | POA: Insufficient documentation

## 2011-05-21 DIAGNOSIS — M461 Sacroiliitis, not elsewhere classified: Secondary | ICD-10-CM | POA: Insufficient documentation

## 2011-05-21 LAB — URINALYSIS, ROUTINE W REFLEX MICROSCOPIC
Bilirubin Urine: NEGATIVE
Glucose, UA: NEGATIVE mg/dL
Hgb urine dipstick: NEGATIVE
Specific Gravity, Urine: 1.031 — ABNORMAL HIGH (ref 1.005–1.030)
Urobilinogen, UA: 1 mg/dL (ref 0.0–1.0)

## 2011-06-02 ENCOUNTER — Other Ambulatory Visit: Payer: Self-pay | Admitting: Oncology

## 2011-06-02 ENCOUNTER — Encounter (HOSPITAL_BASED_OUTPATIENT_CLINIC_OR_DEPARTMENT_OTHER): Payer: No Typology Code available for payment source | Admitting: Oncology

## 2011-06-02 DIAGNOSIS — C7952 Secondary malignant neoplasm of bone marrow: Secondary | ICD-10-CM

## 2011-06-02 DIAGNOSIS — F411 Generalized anxiety disorder: Secondary | ICD-10-CM

## 2011-06-02 DIAGNOSIS — C50919 Malignant neoplasm of unspecified site of unspecified female breast: Secondary | ICD-10-CM

## 2011-06-02 DIAGNOSIS — Z23 Encounter for immunization: Secondary | ICD-10-CM

## 2011-06-02 DIAGNOSIS — R630 Anorexia: Secondary | ICD-10-CM

## 2011-06-02 DIAGNOSIS — C7951 Secondary malignant neoplasm of bone: Secondary | ICD-10-CM

## 2011-06-02 DIAGNOSIS — R110 Nausea: Secondary | ICD-10-CM

## 2011-06-02 LAB — COMPREHENSIVE METABOLIC PANEL
ALT: 24 U/L (ref 0–35)
BUN: 14 mg/dL (ref 6–23)
CO2: 29 mEq/L (ref 19–32)
Calcium: 10.2 mg/dL (ref 8.4–10.5)
Chloride: 101 mEq/L (ref 96–112)
Creatinine, Ser: 1.24 mg/dL — ABNORMAL HIGH (ref 0.50–1.10)
Glucose, Bld: 73 mg/dL (ref 70–99)
Total Bilirubin: 0.2 mg/dL — ABNORMAL LOW (ref 0.3–1.2)

## 2011-06-02 LAB — TSH: TSH: 35.317 u[IU]/mL — ABNORMAL HIGH (ref 0.350–4.500)

## 2011-06-02 LAB — CANCER ANTIGEN 27.29: CA 27.29: 55 U/mL — ABNORMAL HIGH (ref 0–39)

## 2011-06-02 LAB — CBC WITH DIFFERENTIAL/PLATELET
Basophils Absolute: 0 10*3/uL (ref 0.0–0.1)
Eosinophils Absolute: 0 10*3/uL (ref 0.0–0.5)
HCT: 37.1 % (ref 34.8–46.6)
HGB: 12.4 g/dL (ref 11.6–15.9)
LYMPH%: 22.3 % (ref 14.0–49.7)
MCV: 85.5 fL (ref 79.5–101.0)
MONO#: 0.4 10*3/uL (ref 0.1–0.9)
MONO%: 7.7 % (ref 0.0–14.0)
NEUT#: 3.8 10*3/uL (ref 1.5–6.5)
NEUT%: 69 % (ref 38.4–76.8)
Platelets: 209 10*3/uL (ref 145–400)
WBC: 5.5 10*3/uL (ref 3.9–10.3)
nRBC: 0 % (ref 0–0)

## 2011-06-04 ENCOUNTER — Encounter (INDEPENDENT_AMBULATORY_CARE_PROVIDER_SITE_OTHER): Payer: Self-pay | Admitting: Surgery

## 2011-06-10 ENCOUNTER — Other Ambulatory Visit: Payer: Self-pay | Admitting: Oncology

## 2011-06-10 DIAGNOSIS — M545 Low back pain: Secondary | ICD-10-CM

## 2011-06-14 ENCOUNTER — Other Ambulatory Visit: Payer: Self-pay | Admitting: Oncology

## 2011-06-14 DIAGNOSIS — M545 Low back pain: Secondary | ICD-10-CM

## 2011-06-14 DIAGNOSIS — R921 Mammographic calcification found on diagnostic imaging of breast: Secondary | ICD-10-CM

## 2011-06-15 ENCOUNTER — Other Ambulatory Visit: Payer: Self-pay

## 2011-06-18 ENCOUNTER — Other Ambulatory Visit: Payer: Self-pay | Admitting: *Deleted

## 2011-06-18 ENCOUNTER — Encounter: Payer: Self-pay | Admitting: *Deleted

## 2011-06-18 DIAGNOSIS — C50919 Malignant neoplasm of unspecified site of unspecified female breast: Secondary | ICD-10-CM

## 2011-06-18 MED ORDER — ONDANSETRON HCL 8 MG PO TABS: 8.0000 mg | ORAL_TABLET | Freq: Two times a day (BID) | ORAL | Status: DC | PRN

## 2011-06-18 MED ORDER — HYDROCODONE-ACETAMINOPHEN 5-325 MG PO TABS: 1.0000 | ORAL_TABLET | Freq: Three times a day (TID) | ORAL | Status: DC | PRN

## 2011-06-18 MED ORDER — LORAZEPAM 0.5 MG PO TABS: 0.5000 mg | ORAL_TABLET | Freq: Two times a day (BID) | ORAL | Status: DC | PRN

## 2011-06-25 ENCOUNTER — Ambulatory Visit
Admission: RE | Admit: 2011-06-25 | Discharge: 2011-06-25 | Disposition: A | Discharge status: Home / Self Care | Payer: No Typology Code available for payment source | Source: Ambulatory Visit | Attending: Oncology | Admitting: Oncology

## 2011-06-25 DIAGNOSIS — R921 Mammographic calcification found on diagnostic imaging of breast: Secondary | ICD-10-CM

## 2011-06-28 ENCOUNTER — Encounter: Payer: Self-pay | Admitting: *Deleted

## 2011-06-29 ENCOUNTER — Ambulatory Visit: Payer: Self-pay | Admitting: Oncology

## 2011-06-29 ENCOUNTER — Other Ambulatory Visit: Payer: Self-pay

## 2011-06-30 ENCOUNTER — Other Ambulatory Visit: Payer: Self-pay | Admitting: Lab

## 2011-06-30 ENCOUNTER — Ambulatory Visit: Payer: Self-pay

## 2011-07-01 ENCOUNTER — Other Ambulatory Visit: Payer: Self-pay | Admitting: Oncology

## 2011-07-02 ENCOUNTER — Telehealth: Payer: Self-pay | Admitting: *Deleted

## 2011-07-02 NOTE — Telephone Encounter (Signed)
Called patient to inform the patient of the new date and time of the mri of the lumbar spine and the thoraic spine on 07-06-2011 arrival time 11:45am. Patient confirmed over the phone on 07-02-2011.

## 2011-07-06 ENCOUNTER — Other Ambulatory Visit (HOSPITAL_COMMUNITY): Payer: No Typology Code available for payment source

## 2011-07-06 ENCOUNTER — Inpatient Hospital Stay (HOSPITAL_COMMUNITY): Admission: RE | Admit: 2011-07-06 | Payer: No Typology Code available for payment source | Source: Ambulatory Visit

## 2011-07-09 ENCOUNTER — Encounter (INDEPENDENT_AMBULATORY_CARE_PROVIDER_SITE_OTHER): Payer: Self-pay | Admitting: Surgery

## 2011-07-12 ENCOUNTER — Encounter: Payer: Self-pay | Admitting: *Deleted

## 2011-07-12 NOTE — Progress Notes (Signed)
[facility] [phone]) faxed refill requests for lorazepam, lortab 5/325mg zofran and tramadol.  Request to MD for review.

## 2011-07-14 ENCOUNTER — Other Ambulatory Visit: Payer: Self-pay | Admitting: *Deleted

## 2011-07-14 DIAGNOSIS — C50919 Malignant neoplasm of unspecified site of unspecified female breast: Secondary | ICD-10-CM

## 2011-07-14 MED ORDER — ONDANSETRON HCL 8 MG PO TABS: 8.0000 mg | ORAL_TABLET | Freq: Two times a day (BID) | ORAL | Status: DC | PRN

## 2011-07-14 MED ORDER — OXYCODONE-ACETAMINOPHEN 2.5-325 MG PO TABS: 1.0000 | ORAL_TABLET | ORAL | Status: DC | PRN

## 2011-07-14 MED ORDER — LORAZEPAM 0.5 MG PO TABS: 0.5000 mg | ORAL_TABLET | Freq: Two times a day (BID) | ORAL | Status: DC | PRN

## 2011-07-14 MED ORDER — TRAMADOL HCL 50 MG PO TABS: 50.0000 mg | ORAL_TABLET | Freq: Four times a day (QID) | ORAL | Status: AC | PRN

## 2011-07-22 ENCOUNTER — Ambulatory Visit: Payer: No Typology Code available for payment source | Admitting: Oncology

## 2011-07-22 ENCOUNTER — Other Ambulatory Visit: Payer: No Typology Code available for payment source | Admitting: Lab

## 2011-07-27 ENCOUNTER — Other Ambulatory Visit: Payer: Self-pay | Admitting: Oncology

## 2011-07-28 ENCOUNTER — Other Ambulatory Visit: Payer: Self-pay | Admitting: Oncology

## 2011-07-28 ENCOUNTER — Other Ambulatory Visit: Payer: Self-pay | Admitting: *Deleted

## 2011-07-28 ENCOUNTER — Other Ambulatory Visit (HOSPITAL_BASED_OUTPATIENT_CLINIC_OR_DEPARTMENT_OTHER): Payer: No Typology Code available for payment source | Admitting: Lab

## 2011-07-28 ENCOUNTER — Telehealth: Payer: Self-pay | Admitting: *Deleted

## 2011-07-28 ENCOUNTER — Other Ambulatory Visit: Payer: Self-pay | Admitting: Physician Assistant

## 2011-07-28 ENCOUNTER — Ambulatory Visit (HOSPITAL_BASED_OUTPATIENT_CLINIC_OR_DEPARTMENT_OTHER): Payer: No Typology Code available for payment source

## 2011-07-28 VITALS — BP 133/88 | HR 68 | Temp 97.7°F

## 2011-07-28 DIAGNOSIS — C50919 Malignant neoplasm of unspecified site of unspecified female breast: Secondary | ICD-10-CM

## 2011-07-28 DIAGNOSIS — C7951 Secondary malignant neoplasm of bone: Secondary | ICD-10-CM

## 2011-07-28 DIAGNOSIS — C800 Disseminated malignant neoplasm, unspecified: Secondary | ICD-10-CM

## 2011-07-28 DIAGNOSIS — R630 Anorexia: Secondary | ICD-10-CM

## 2011-07-28 LAB — CBC WITH DIFFERENTIAL/PLATELET
EOS%: 0.7 % (ref 0.0–7.0)
Eosinophils Absolute: 0 10*3/uL (ref 0.0–0.5)
MCH: 29 pg (ref 25.1–34.0)
MCV: 89 fL (ref 79.5–101.0)
MONO%: 7.9 % (ref 0.0–14.0)
NEUT#: 4.4 10*3/uL (ref 1.5–6.5)
RBC: 4 10*6/uL (ref 3.70–5.45)
RDW: 16 % — ABNORMAL HIGH (ref 11.2–14.5)
lymph#: 1.2 10*3/uL (ref 0.9–3.3)
nRBC: 0 % (ref 0–0)

## 2011-07-28 LAB — COMPREHENSIVE METABOLIC PANEL
ALT: 13 U/L (ref 0–35)
Alkaline Phosphatase: 60 U/L (ref 39–117)
Potassium: 4.4 mEq/L (ref 3.5–5.3)
Sodium: 138 mEq/L (ref 135–145)
Total Bilirubin: 0.3 mg/dL (ref 0.3–1.2)
Total Protein: 6.8 g/dL (ref 6.0–8.3)

## 2011-07-28 MED ORDER — FLUCONAZOLE 100 MG PO TABS: 100.0000 mg | ORAL_TABLET | Freq: Every day | ORAL | Status: AC

## 2011-07-28 MED ORDER — SODIUM CHLORIDE 0.9 % IV SOLN
Freq: Once | INTRAVENOUS | Status: AC
  Administered 2011-07-28: 13:00:00 via INTRAVENOUS

## 2011-07-28 MED ORDER — LORAZEPAM 1 MG PO TABS
0.5000 mg | ORAL_TABLET | Freq: Once | ORAL | Status: AC
  Administered 2011-07-28: 0.5 mg via ORAL

## 2011-07-28 MED ORDER — ZOLEDRONIC ACID 4 MG/100ML IV SOLN
4.0000 mg | Freq: Once | INTRAVENOUS | Status: AC
  Administered 2011-07-28: 4 mg via INTRAVENOUS
  Filled 2011-07-28: qty 100

## 2011-07-28 MED ORDER — HEPARIN SOD (PORK) LOCK FLUSH 100 UNIT/ML IV SOLN
250.0000 [IU] | Freq: Once | INTRAVENOUS | Status: DC | PRN
  Filled 2011-07-28: qty 5

## 2011-07-28 NOTE — Telephone Encounter (Signed)
GAVE PATIENT APPOINTMENT FOR 08-11-2011 PRINTED OUT CALENDAR AND GAVE TO THE PATIENT'S SISTER

## 2011-07-28 NOTE — Patient Instructions (Signed)
1410 Pt ambulatory upon discharge.  Pt was handed scheduled with next MD appt and instructed that it was very important to keep each appt.  Pt verbalized understanding.  Pt also informed that MD would be calling medication in to her pharmacy regarding thrush in mouth.  Pt again verbalized understanding that she needs to pick up meds and take them to resolve mouth soreness.

## 2011-07-28 NOTE — Telephone Encounter (Signed)
gave patient appointment for 08-19-2011 08-25-2011 07-29-2011 08-26-2011 printed out calendar and gave to the patient 

## 2011-08-11 ENCOUNTER — Ambulatory Visit: Payer: Self-pay

## 2011-08-11 ENCOUNTER — Ambulatory Visit (HOSPITAL_BASED_OUTPATIENT_CLINIC_OR_DEPARTMENT_OTHER): Payer: Self-pay | Admitting: Oncology

## 2011-08-11 ENCOUNTER — Telehealth: Payer: Self-pay | Admitting: Oncology

## 2011-08-11 ENCOUNTER — Other Ambulatory Visit (HOSPITAL_BASED_OUTPATIENT_CLINIC_OR_DEPARTMENT_OTHER): Payer: Self-pay | Admitting: Lab

## 2011-08-11 ENCOUNTER — Encounter: Payer: Self-pay | Admitting: Oncology

## 2011-08-11 VITALS — BP 160/96 | HR 81 | Temp 97.8°F | Ht 69.0 in | Wt 163.8 lb

## 2011-08-11 DIAGNOSIS — C50919 Malignant neoplasm of unspecified site of unspecified female breast: Secondary | ICD-10-CM

## 2011-08-11 DIAGNOSIS — C7951 Secondary malignant neoplasm of bone: Secondary | ICD-10-CM

## 2011-08-11 LAB — CBC WITH DIFFERENTIAL/PLATELET
BASO%: 0.2 % (ref 0.0–2.0)
EOS%: 0.9 % (ref 0.0–7.0)
LYMPH%: 14.4 % (ref 14.0–49.7)
MCH: 30 pg (ref 25.1–34.0)
MCHC: 33.2 g/dL (ref 31.5–36.0)
MCV: 90.3 fL (ref 79.5–101.0)
MONO%: 5.3 % (ref 0.0–14.0)
Platelets: 256 10*3/uL (ref 145–400)
RBC: 4.2 10*6/uL (ref 3.70–5.45)

## 2011-08-11 LAB — COMPREHENSIVE METABOLIC PANEL
ALT: 18 U/L (ref 0–35)
AST: 21 U/L (ref 0–37)
Alkaline Phosphatase: 75 U/L (ref 39–117)
Glucose, Bld: 76 mg/dL (ref 70–99)
Sodium: 138 mEq/L (ref 135–145)
Total Bilirubin: 0.2 mg/dL — ABNORMAL LOW (ref 0.3–1.2)
Total Protein: 7.4 g/dL (ref 6.0–8.3)

## 2011-08-11 NOTE — Telephone Encounter (Signed)
Gv pts sister appts from jan2013

## 2011-08-11 NOTE — Progress Notes (Signed)
Hematology and Oncology Follow Up Visit  Kristin [name] [id number] [birth year] 46 y.o. 08/11/2011    HPI: Kristin [name] was originally seen by Dr. Darnelle Catalan in the hospital for an initial consult on September 17, 2009.  At that time, she presented with back pain and was found to have a markedly abnormal left breast and axilla as well as lytic bone lesions.  We obtained a biopsy of the left breast on February 8th which showed a high-grade tumor which was ER 100% receptor positive but Her-2 and progesterone receptor negative.  The proliferation fraction was elevated at 33%.  We then obtained a bone biopsy on February 11th and her T12 vertebral body indeed was found to harbor an estrogen receptor positive, progesterone receptor negative carcinoma consistent with her breast primary.    She had complete staging studies including a brain MRI with and without contrast which showed no evidence of metastatic disease to the brain, and a CT of the abdomen and pelvis which showed no liver lesions and no other lesions other than in the bones.  There was a fibroid uterus incidentally noted.  She had a CT of the chest previously (February 7th) which showed only some chronic appearing changes in the left upper lobe, consistent with her history of treated tuberculosis.  Again, there were multiple lytic bone lesions.    Kristin [name] was treated in the neoadjuvant setting of docetaxel cyclophosphamide and doxorubicin x6, completed June 2011. She also received radiation therapy to T3 through T12.  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 Grade 2, negative sentinel lymph node.  Patient was started on tamoxifen in July 2011, in addition to zoledronic acid which is been given monthly.  Interim History:   Patient returns today for brief followup. She continues on tamoxifen, 20 mg daily, which she tells me she  tolerates well. She's also been receiving zoledronic acid. This had been on hold for the last couple of months due to the fact that Kristin [name] had lost a filling in one of her bottom teeth, and dental care was pending. She is still not seen a dentist. She's missed several of her followup appointments here, and she did receive zoledronic acid 2 weeks ago on December 19. She presents for reassessment today.  The patient's primary concern today is the fact that she "think she's pregnant". I will note that we obtained a pregnancy test in October 2012 which was negative. We did discuss at that time the importance of using barrier forms of birth control, and the patient does voice her understanding.  Otherwise, the patient continues to have pain although she tells me it is well controlled at the current time. She's had no recent fevers or chills. She has occasional nausea, no emesis. No change in bowel habits. No shortness of breath. No abnormal bleeding.  A detailed review of systems is otherwise noncontributory as noted below.  Review of Systems: Constitutional:  no weight loss, fever, night sweats Eyes: negative [id number] Cardiovascular: no chest pain or dyspnea on exertion Respiratory: no cough, shortness of breath, or wheezing Neurological: negative Dermatological: negative Gastrointestinal: no abdominal pain, change in bowel habits, or black or bloody stools Genito-Urinary: no dysuria, trouble voiding, or hematuria Hematological and Lymphatic: negative Breast: negative Musculoskeletal: positive for - pain in back - generalized Remaining ROS negative.  Family History: Patient's mother is alive and in her mid 80s. She  has a history of treated tuberculosis. The patient's father died from a drug overdose, and the patient does not know much about him. She has one brother with hypertension and one sister, Kristin [name]. There is no known history of breast or ovarian cancer in the family.  Gynecologic  History: Gx P1, first pregnancy to term at age 27. Her son was premature.  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 She is currently unemployed. She has been living with her mother, Kristin [name]. The patient's son is in high school. The patient is a Control and instrumentation engineer.   Medications:   I have reviewed the patient's current medications.  Current Outpatient Prescriptions  Medication Sig Dispense Refill  . DOCUSATE SODIUM PO Take by mouth daily.        Marland Kitchen levothyroxine (SYNTHROID, LEVOTHROID) 150 MCG tablet Take 150 mcg by mouth daily.        Marland Kitchen LORazepam (ATIVAN) 0.5 MG tablet Take 1 tablet (0.5 mg total) by mouth 2 (two) times daily as needed.  60 tablet  0  . ondansetron (ZOFRAN) 8 MG tablet Take 1 tablet (8 mg total) by mouth every 12 (twelve) hours as needed.  30 tablet  0  . oxycodone-acetaminophen (PERCOCET) 2.5-325 MG per tablet Take 1 tablet by mouth every 4 (four) hours as needed.  90 tablet  0  . tamoxifen (NOLVADEX) 20 MG tablet Take 20 mg by mouth daily.        Marland Kitchen HYDROcodone-acetaminophen (NORCO) 5-325 MG per tablet Take 1 tablet by mouth every 8 (eight) hours as needed.  30 tablet  0    Allergies: No Known Allergies  Physical Exam: Filed Vitals:   08/11/11 1131  BP: 160/96  Pulse: 81  Temp: 97.8 F (36.6 C)   HEENT:  Sclerae anicteric, conjunctivae pink.  Oropharynx clear.  No mucositis or candidiasis.   Nodes:  No cervical, supraclavicular, or axillary lymphadenopathy palpated.  Breast Exam:  Deferred. Lungs:  Clear to auscultation bilaterally.  No crackles, rhonchi, or wheezes.   Heart:  Regular rate and rhythm.   Abdomen:  Soft, nontender. Nondistended.  Positive bowel sounds.  No organomegaly or masses palpated.   Musculoskeletal:  No focal spinal tenderness to palpation.  Extremities:  Benign.  No peripheral edema or cyanosis.   Skin:  Benign.   Neuro:  Nonfocal.   Lab Results: Lab Results  Component Value Date     WBC 6.8 08/11/2011   HGB 12.6 08/11/2011   HCT 37.9 08/11/2011   MCV 90.3 08/11/2011   PLT 256 08/11/2011   NEUTROABS 5.4 08/11/2011     Chemistry      Component Value Date/Time   NA 138 08/11/2011 1056   K 4.2 08/11/2011 1056   CL 102 08/11/2011 1056   CO2 31 08/11/2011 1056   BUN 16 08/11/2011 1056   CREATININE 1.43* 08/11/2011 1056      Component Value Date/Time   CALCIUM 9.4 08/11/2011 1056   ALKPHOS 75 08/11/2011 1056   AST 21 08/11/2011 1056   ALT 18 08/11/2011 1056   BILITOT 0.2* 08/11/2011 1056     Quantitative hCG today was negative for pregnancy at <1.0.   Radiological Studies:  No results found.   Assessment:  A 46 year old [place] woman with history of stage IV breast carcinoma, initially diagnosed in February 2011.  Evidence of bony metastatic disease at diagnosis.  Treated with docetaxel, cyclophosphamide and doxorubicin x6, completed in June 2011.  Status post lumpectomy and  sentinel lymph node biopsy in August 2011 for a 2-mm focus of residual invasive ductal carcinoma.  Grade 2, negative sentinel lymph node.  On tamoxifen since July 2011, and on zoledronic acid given monthly as well. The patient is also status post radiation to T3 through T12. Most recent dose of zoledronic acid given on 07/28/11.  Plan:  This case was reviewed with Dr. Darnelle Catalan. Fortunately, Shallon is pregnancy test was negative today. We did again review the fact that she needs to use barrier forms of birth control she is sexually active. Her sister, Kristin [name], who accompanies her here today, also voiced her understanding and agreement with this.  Jeidy will return in 3 weeks for followup with Dr. Darnelle Catalan. At that time they will review her treatment plan. She will continue in the meanwhile on tamoxifen. She is still trying to get her dental appointment, and they will decide in 3 weeks whether or not to proceed with additional zoledronic acid at this time. The patient requests no refills on her medications today. I am  however referring her back to our Child psychotherapist for additional assistance.  This plan was reviewed with the patient, who voices understanding and agreement.  She knows to call with any changes or problems.    Kolbe Delmonaco, PA-C 08/11/2011

## 2011-08-19 ENCOUNTER — Other Ambulatory Visit: Payer: Self-pay | Admitting: *Deleted

## 2011-08-19 DIAGNOSIS — C50919 Malignant neoplasm of unspecified site of unspecified female breast: Secondary | ICD-10-CM

## 2011-08-19 MED ORDER — LORAZEPAM 0.5 MG PO TABS: 0.5000 mg | ORAL_TABLET | Freq: Two times a day (BID) | ORAL | Status: DC | PRN

## 2011-08-19 MED ORDER — DOCUSATE SODIUM 100 MG PO CAPS: 100.0000 mg | ORAL_CAPSULE | Freq: Two times a day (BID) | ORAL | Status: AC | PRN

## 2011-08-19 MED ORDER — TAMOXIFEN CITRATE 20 MG PO TABS: 20.0000 mg | ORAL_TABLET | Freq: Every day | ORAL | Status: DC

## 2011-08-19 MED ORDER — HYDROCODONE-ACETAMINOPHEN 5-325 MG PO TABS: 1.0000 | ORAL_TABLET | Freq: Three times a day (TID) | ORAL | Status: DC | PRN

## 2011-08-19 MED ORDER — ONDANSETRON HCL 8 MG PO TABS: 8.0000 mg | ORAL_TABLET | Freq: Two times a day (BID) | ORAL | Status: DC | PRN

## 2011-08-19 MED ORDER — TRAMADOL HCL 50 MG PO TABS: 50.0000 mg | ORAL_TABLET | Freq: Four times a day (QID) | ORAL | Status: AC | PRN

## 2011-08-19 NOTE — Telephone Encounter (Signed)
Pharmacy called to report a female family member has called them to follow up on status of her refills saying she is in a lot of pain and needs all her meds filled now. Was informed by triage nurse that Mena Pauls, RN had already approved these refills this am. Message left with desk RN to follow up.

## 2011-08-20 ENCOUNTER — Other Ambulatory Visit: Payer: Self-pay | Admitting: *Deleted

## 2011-08-20 DIAGNOSIS — C7951 Secondary malignant neoplasm of bone: Secondary | ICD-10-CM

## 2011-08-20 MED ORDER — LEVOTHYROXINE SODIUM 150 MCG PO TABS: 150.0000 ug | ORAL_TABLET | Freq: Every day | ORAL | Status: DC

## 2011-08-25 ENCOUNTER — Other Ambulatory Visit: Payer: Self-pay | Admitting: Lab

## 2011-08-25 ENCOUNTER — Ambulatory Visit: Payer: Self-pay

## 2011-08-27 ENCOUNTER — Telehealth: Payer: Self-pay | Admitting: Oncology

## 2011-08-30 ENCOUNTER — Encounter: Payer: Self-pay | Admitting: *Deleted

## 2011-08-30 NOTE — Progress Notes (Signed)
Clinical Social Work has met with pt previously to discuss resources and offer support.  CSW provided pt and pt's sister with a breast cancer resource guide and patient and family support calendar.  Pt's sister contacted CSW again requesting support group information.  CSW again reviewed available resources at [facility] and caregiver support information in resource guide.  CSW also mailed pt and pt's sister an updated patient and family support calendar and information on [employer] (one-on-one cancer support).  CSW encouraged pt and pt's sister to review information and contact CSW with other questions or concerns.  Tamala Julian, LCSW

## 2011-08-30 NOTE — Telephone Encounter (Signed)
See notes above. thanks °

## 2011-09-02 ENCOUNTER — Ambulatory Visit: Payer: Self-pay

## 2011-09-02 ENCOUNTER — Ambulatory Visit: Payer: Self-pay | Admitting: Oncology

## 2011-09-02 ENCOUNTER — Telehealth: Payer: Self-pay | Admitting: Oncology

## 2011-09-02 ENCOUNTER — Other Ambulatory Visit: Payer: Self-pay

## 2011-09-02 ENCOUNTER — Encounter: Payer: Self-pay | Admitting: Physician Assistant

## 2011-09-02 ENCOUNTER — Ambulatory Visit (HOSPITAL_BASED_OUTPATIENT_CLINIC_OR_DEPARTMENT_OTHER): Payer: Self-pay | Admitting: Physician Assistant

## 2011-09-02 ENCOUNTER — Other Ambulatory Visit: Payer: Self-pay | Admitting: Lab

## 2011-09-02 VITALS — BP 132/86 | HR 87 | Temp 98.4°F | Ht 69.0 in | Wt 161.7 lb

## 2011-09-02 DIAGNOSIS — C7951 Secondary malignant neoplasm of bone: Secondary | ICD-10-CM

## 2011-09-02 DIAGNOSIS — F329 Major depressive disorder, single episode, unspecified: Secondary | ICD-10-CM

## 2011-09-02 DIAGNOSIS — Z170 Estrogen receptor positive status [ER+]: Secondary | ICD-10-CM

## 2011-09-02 DIAGNOSIS — C50919 Malignant neoplasm of unspecified site of unspecified female breast: Secondary | ICD-10-CM

## 2011-09-02 DIAGNOSIS — Z79810 Long term (current) use of selective estrogen receptor modulators (SERMs): Secondary | ICD-10-CM

## 2011-09-02 DIAGNOSIS — F32A Depression, unspecified: Secondary | ICD-10-CM | POA: Insufficient documentation

## 2011-09-02 LAB — CANCER ANTIGEN 27.29: CA 27.29: 111 U/mL — ABNORMAL HIGH (ref 0–39)

## 2011-09-02 LAB — CBC WITH DIFFERENTIAL/PLATELET
Eosinophils Absolute: 0.1 10*3/uL (ref 0.0–0.5)
MONO#: 0.7 10*3/uL (ref 0.1–0.9)
MONO%: 7.4 % (ref 0.0–14.0)
NEUT#: 6.7 10*3/uL — ABNORMAL HIGH (ref 1.5–6.5)
RBC: 4.39 10*6/uL (ref 3.70–5.45)
RDW: 15.1 % — ABNORMAL HIGH (ref 11.2–14.5)
WBC: 9.2 10*3/uL (ref 3.9–10.3)
nRBC: 0 % (ref 0–0)

## 2011-09-02 LAB — COMPREHENSIVE METABOLIC PANEL
ALT: 21 U/L (ref 0–35)
AST: 22 U/L (ref 0–37)
CO2: 32 mEq/L (ref 19–32)
Chloride: 102 mEq/L (ref 96–112)
Sodium: 140 mEq/L (ref 135–145)
Total Bilirubin: 0.2 mg/dL — ABNORMAL LOW (ref 0.3–1.2)
Total Protein: 8 g/dL (ref 6.0–8.3)

## 2011-09-02 NOTE — Telephone Encounter (Signed)
gve the pt her feb 2013 appt calendar along with the appt for behavioral health.

## 2011-09-02 NOTE — Progress Notes (Signed)
Hematology and Oncology Follow Up Visit  Kristin [name] [id number] 1966/05/20 46 y.o. 09/02/2011    HPI: Kristin [name] was originally seen by Dr. Darnelle Catalan in the hospital for an initial consult on September 17, 2009.  At that time, she presented with back pain and was found to have a markedly abnormal left breast and axilla as well as lytic bone lesions.  We obtained a biopsy of the left breast on February 8th which showed a high-grade tumor which was ER 100% receptor positive but Her-2 and progesterone receptor negative.  The proliferation fraction was elevated at 33%.  We then obtained a bone biopsy on February 11th and her T12 vertebral body indeed was found to harbor an estrogen receptor positive, progesterone receptor negative carcinoma consistent with her breast primary.    She had complete staging studies including a brain MRI with and without contrast which showed no evidence of metastatic disease to the brain, and a CT of the abdomen and pelvis which showed no liver lesions and no other lesions other than in the bones.  There was a fibroid uterus incidentally noted.  She had a CT of the chest previously (February 7th) which showed only some chronic appearing changes in the left upper lobe, consistent with her history of treated tuberculosis.  Again, there were multiple lytic bone lesions.    Kristin [name] was treated in the neoadjuvant setting of docetaxel cyclophosphamide and doxorubicin x6, completed June 2011. She also received radiation therapy to T3 through T12.  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 Grade 2, negative sentinel lymph node.  Patient was started on tamoxifen in July 2011, in addition to zoledronic acid which is been given monthly.  Interim History:   Patient returns today for followup of her metastatic breast carcinoma. She continues on tamoxifen, 20 mg  daily, which she tolerates well, with no significant hot flashes, no change in vision, no peripheral swelling or and evidence of abnormal clotting. She also denies any vaginal bleeding, discharge, or dryness. We once again reviewed the fact that she could possibly get pregnant while on the tamoxifen, and she voices understanding of the importance of using barrier forms of birth control.  Kristin [name] has also been receiving zoledronic acid, last dose given December 19. This is currently on hold secondary to some dental issues, a lost filling, and the fact that she has not been able to see a dentist for further evaluation. She denies any current jaw pain.   Kristin [name]'s biggest concern today is the fact that she feels depressed. She's having some anxiety. She is very tearful on presentation. She denies any suicidal ideations. She does welcome, however, a referral to psychiatry or behavioral health. She continues on Effexor at 75 mg daily, and also has lorazepam to take as needed. She averages one lorazepam daily per her report.  Otherwise, the patient continues to have pain although she tells me it is fairly well controlled at the current time. She's taking hydrocodone/APAP in conjunction with tramadol. While this does seem to help, she finds herself taking more tramadol while on the hydrocodone versus oxycodone. She wonders if we might switch to oxycodone/APAP with her next medication refill and we will certainly consider this.  She's had no recent fevers or chills. She has occasional nausea, no emesis. No change in bowel habits. No shortness of breath. No abnormal bleeding.  A detailed review  of systems is otherwise noncontributory as noted below.  Review of Systems: Constitutional:  Fatigue, depression, no weight loss, fever, night sweats Eyes: negative [id number] Cardiovascular: no chest pain or dyspnea on exertion Respiratory: no cough, shortness of breath, or wheezing Neurological:  negative Dermatological: negative Gastrointestinal: no abdominal pain, nausea, change in bowel habits, or black or bloody stools Genito-Urinary: no dysuria, trouble voiding, or hematuria Hematological and Lymphatic: negative Breast: negative Musculoskeletal: positive for - pain in back - generalized Remaining ROS negative.  Family History: Patient's mother is alive and in her mid 18s. She has a history of treated tuberculosis. The patient's father died from a drug overdose, and the patient does not know much about him. She has one brother with hypertension and one sister, Kristin [name]. There is no known history of breast or ovarian cancer in the family.  Gynecologic History: Gx P1, first pregnancy to term at age 57. Her son was premature.  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 She is currently unemployed. She has been living with her mother, Kristin [name]. The patient's son is in high school. The patient is a Control and instrumentation engineer.   Medications:   I have reviewed the patient's current medications.  Current Outpatient Prescriptions  Medication Sig Dispense Refill  . HYDROcodone-acetaminophen (NORCO) 5-325 MG per tablet Take 1 tablet by mouth every 8 (eight) hours as needed.  30 tablet  0  . levothyroxine (SYNTHROID, LEVOTHROID) 150 MCG tablet Take 1 tablet (150 mcg total) by mouth daily.  30 tablet  11  . LORazepam (ATIVAN) 0.5 MG tablet Take 1 tablet (0.5 mg total) by mouth 2 (two) times daily as needed.  60 tablet  1  . ondansetron (ZOFRAN) 8 MG tablet Take 1 tablet (8 mg total) by mouth every 12 (twelve) hours as needed.  30 tablet  0  . tamoxifen (NOLVADEX) 20 MG tablet Take 1 tablet (20 mg total) by mouth daily.  90 tablet  3  . traMADol (ULTRAM) 50 MG tablet Take 50 mg by mouth every 6 (six) hours as needed.      . venlafaxine (EFFEXOR) 75 MG tablet Take 75 mg by mouth 2 (two) times daily.      Marland Kitchen oxycodone-acetaminophen (PERCOCET) 2.5-325  MG per tablet Take 1 tablet by mouth every 4 (four) hours as needed.  90 tablet  0    Allergies: No Known Allergies  Physical Exam: Filed Vitals:   09/02/11 0901  BP: 132/86  Pulse: 87  Temp: 98.4 F (36.9 C)   HEENT:  Sclerae anicteric, conjunctivae pink.  Oropharynx clear.  No mucositis or candidiasis.   Nodes:  No cervical, supraclavicular, or axillary lymphadenopathy palpated.  Breast Exam:  Deferred. Lungs:  Clear to auscultation bilaterally.  No crackles, rhonchi, or wheezes.   Heart:  Regular rate and rhythm.   Abdomen:  Soft, nontender.  Positive bowel sounds.  No organomegaly or masses palpated.   Musculoskeletal:  No focal spinal tenderness to gentle palpation.  Extremities:  Benign.  No peripheral edema or cyanosis.   Skin:  Benign.   Neuro:  Nonfocal.   Lab Results: Lab Results  Component Value Date   WBC 9.2 09/02/2011   HGB 13.0 09/02/2011   HCT 38.7 09/02/2011   MCV 88.2 09/02/2011   PLT 220 09/02/2011   NEUTROABS 6.7* 09/02/2011     Chemistry      Component Value Date/Time   NA 140 09/02/2011 0844   K 4.0 09/02/2011 0844  CL 102 09/02/2011 0844   CO2 32 09/02/2011 0844   BUN 19 09/02/2011 0844   CREATININE 1.31* 09/02/2011 0844      Component Value Date/Time   CALCIUM 9.9 09/02/2011 0844   ALKPHOS 74 09/02/2011 0844   AST 22 09/02/2011 0844   ALT 21 09/02/2011 0844   BILITOT 0.2* 09/02/2011 0844     CA27.29 was repeated today, results are pending.  Radiological Studies:  No results found.   Assessment:  A 46 year old [place] woman with history of stage IV breast carcinoma, initially diagnosed in February 2011.  Evidence of bony metastatic disease at diagnosis.  Treated with docetaxel, cyclophosphamide and doxorubicin x6, completed in June 2011.  Status post lumpectomy and sentinel lymph node biopsy in August 2011 for a 2-mm focus of residual invasive ductal carcinoma.  Grade 2, negative sentinel lymph node.  On tamoxifen since July 2011, and on  zoledronic acid given monthly as well. The patient is also status post radiation to T3 through T12. Most recent dose of zoledronic acid given on 07/28/11.  Plan:  This case was reviewed with Dr. Darnelle Catalan. We will continue to hold Ahriana is zoledronic acid, and she will continue on tamoxifen. We are placing a referral for psychiatric evaluation at behavioral health. I also offered Joby support here through our office, with either our counselors, chaplain or social workers. She tells me she has their phone number and will give them a call.  Maritsa will return for followup in approximately 4 weeks.  This plan was reviewed with the patient, who voices understanding and agreement.  She knows to call with any changes or problems.    Dymin Dingledine, PA-C 09/02/2011

## 2011-09-06 ENCOUNTER — Other Ambulatory Visit: Payer: Self-pay | Admitting: Physician Assistant

## 2011-09-06 ENCOUNTER — Telehealth: Payer: Self-pay | Admitting: *Deleted

## 2011-09-06 DIAGNOSIS — C50919 Malignant neoplasm of unspecified site of unspecified female breast: Secondary | ICD-10-CM

## 2011-09-06 NOTE — Telephone Encounter (Signed)
Called patient and informed patient of the new date and time of the scans on 09-21-2011 arrival time 9:45am

## 2011-09-06 NOTE — Progress Notes (Signed)
Kristin [name] was seen in the office on January 24 for routine followup of her metastatic breast carcinoma. Labs from that day show an increase in her tumor marker, from 21 in August, to 55 in October, and up to 111 last week on 09/02/2011.  I have reviewed this with Dr. Darnelle Catalan, who suggested restaging [name]. Accordingly, we are ordering a bone scan and a chest CT to be done prior to her next appointment here in February.

## 2011-09-16 ENCOUNTER — Telehealth: Payer: Self-pay | Admitting: *Deleted

## 2011-09-16 NOTE — Telephone Encounter (Signed)
PT.'S SISTER DOES NOT KNOW WHERE TO GO FOR THE APPOINTMENT ON 09/17/11. SPOKE TO [facility] AT [facility]. PT. IS TO SEE THERAPIST, MARY ANN, AT 2:00PM. PT.NEEDS TO BE AT THE OFFICE AT 1:00PM TO FILL OUT PAPERWORK. THE ABOVE INFORMATION WAS GIVEN TO PT.'S SISTER ALONG WITH DIRECTIONS AND THE PHONE [phone] FOR [facility]. PT.'S SISTER VOICES UNDERSTANDING.

## 2011-09-17 ENCOUNTER — Encounter (HOSPITAL_COMMUNITY): Payer: Self-pay | Admitting: Psychology

## 2011-09-17 ENCOUNTER — Ambulatory Visit (INDEPENDENT_AMBULATORY_CARE_PROVIDER_SITE_OTHER): Payer: Self-pay | Admitting: Psychology

## 2011-09-17 DIAGNOSIS — F322 Major depressive disorder, single episode, severe without psychotic features: Secondary | ICD-10-CM

## 2011-09-17 NOTE — Progress Notes (Signed)
Patient ID: Kristin [name], female   DOB: July 28, 1966, 46 y.o.   MRN: [id number]  Presenting Problem: Yerlin was referred by her physician at the [facility] for treatment of depression.  She completed the self-report assessment in very sketchy fashion, but was more forthcoming in person.  She admits to many symptoms of depression: depressed mood, anxiety, mood swings, decreased appetite, difficulty sleeping due to "mind racing", anhedonia, excessive worrying, and panic attacks. However, "but I don't understand it.  I don't have any reason to be depressed".  BDI was 35/45 indicating severe depression. She states that what is getting her attention is that she can just sit in her room for hours and do nothing.  "That is not me.  I am active all the time".  Mental Status: Affect is blunted, mood depressed, and she shed tears throughout the interview.  She denies any prior history of psychiatric problems.  Her thoughts are logical and goal-directed, with a desire to get to the bottom of this mystery.  She demonstrates rapid speech with normal rhythm and volume.  She denies any ideas about self harm: "I would never do that.  I love myself too much."  Social history: She reports completion of college with a degree in education.  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  She has never married, but has one son who is a special needs child.  He is 20 and due to graduate from high school this year.  She states she has had two significant relationships in the past but was hurt and now has a friend who is "only a friend".  She lives in her own apartment with her son.  She is currently on disability and states her financial situation is stable. She was born and brought up in [place].  She was living in [place] in 2009, but moved to [place] when the cancer diagnosis was made in order to be closer to treatment and her sisters.  She describes a much more  social life in [place] with many friends that she misses here.  Medical History: She was diagnosed with Breast Cancer, Stage IV with metastasis to her back, in 2009.  She has completed chemotherapy and radiation for the primary tumor and now receives monthly treatment for the back lesion.  She also has a history of thyroid dysfunction and takes replacement hormone.  Substance abuse:  She denies use of alcohol or nicotine or illegal drugs.  She admits to overuse of her prescriptions for lorazepam and Effexor, taking 2-4 times the dose when she has a panic attack.  She is also taking double the prescribed dose daily without discussing it with her physician and using the Effexor as PRN as well as daily.  She acknowledges she runs out of medication and suffers through days when she just isolates herself.  She denies any other type of withdrawal symptoms.  She signed a release and verbally gave me permission to communicate freely with her primary physician about her medication.  Summary:  She did talk fleetingly about the severity of her diagnosis, but stated she took it well and during the treatment phase she felt like "Wonder Woman" , she was so strong.  Now she feels very let down and without purpose.  She relates a recent dream in which her life was shown to her like a movie, with "happy parts and sad parts".  The interpretation she made of the dream was  that it was a kind of reassuring message: "Everything is going to be okay".  That she is depressed seems clear.  Whether she has had a history of hypomania is not yet clear.  Her style and presentation indicate a possible bipolar mood disorder may be present. We talked about the course of therapy with sessions every 2-3 weeks and consultation with a psychiatrist or PA for medication management and she agreed with this. Intervention:  Explained to her the danger of increasing medication doses and then going without and explained the need to inform  her Dr. Rennis Petty this practice and get the medications adjusted to a level that is more appropriate.  She denied having noticed any improvement since she has been taking Effexor, even at the 150 mg. Dose.    Impression:  Major depression;  Rule out Bipolar disorder;    Axis I:  Major Depression, single episode, severe  Axis II:  Deferred  Axis III: Breast Cancer with metastasis  Axis IV: Health; concern about son; lifestyle changes; lack of social support  Axis V: GAF current: 55  Plan:  Regular therapy sessions; medication management; coordination with her physician.  Clarification of diagnosis with mania rating scale.     Shonna Chock, RN, APRN, CNS

## 2011-09-21 ENCOUNTER — Encounter (HOSPITAL_COMMUNITY): Payer: Self-pay | Admitting: Psychology

## 2011-09-21 ENCOUNTER — Encounter (HOSPITAL_COMMUNITY): Payer: Self-pay

## 2011-09-21 ENCOUNTER — Ambulatory Visit (HOSPITAL_COMMUNITY): Payer: Self-pay

## 2011-09-21 ENCOUNTER — Other Ambulatory Visit (HOSPITAL_COMMUNITY): Payer: Self-pay

## 2011-09-21 DIAGNOSIS — F410 Panic disorder [episodic paroxysmal anxiety] without agoraphobia: Secondary | ICD-10-CM | POA: Insufficient documentation

## 2011-09-28 ENCOUNTER — Telehealth (HOSPITAL_COMMUNITY): Payer: Self-pay | Admitting: Psychology

## 2011-09-28 ENCOUNTER — Ambulatory Visit (HOSPITAL_COMMUNITY): Payer: Self-pay | Admitting: Psychology

## 2011-09-28 NOTE — Telephone Encounter (Signed)
No Show -- telephoned home, but not there.

## 2011-10-06 ENCOUNTER — Emergency Department (HOSPITAL_COMMUNITY): Payer: Medicare Other

## 2011-10-06 ENCOUNTER — Emergency Department (HOSPITAL_COMMUNITY)
Admission: EM | Admit: 2011-10-06 | Discharge: 2011-10-06 | Disposition: A | Discharge status: Home / Self Care | Payer: Medicare Other | Attending: Emergency Medicine | Admitting: Emergency Medicine

## 2011-10-06 ENCOUNTER — Encounter (HOSPITAL_COMMUNITY): Payer: Self-pay | Admitting: Emergency Medicine

## 2011-10-06 DIAGNOSIS — M549 Dorsalgia, unspecified: Secondary | ICD-10-CM

## 2011-10-06 DIAGNOSIS — Z853 Personal history of malignant neoplasm of breast: Secondary | ICD-10-CM | POA: Insufficient documentation

## 2011-10-06 DIAGNOSIS — M545 Low back pain, unspecified: Secondary | ICD-10-CM | POA: Insufficient documentation

## 2011-10-06 DIAGNOSIS — I10 Essential (primary) hypertension: Secondary | ICD-10-CM | POA: Insufficient documentation

## 2011-10-06 DIAGNOSIS — C50919 Malignant neoplasm of unspecified site of unspecified female breast: Secondary | ICD-10-CM

## 2011-10-06 DIAGNOSIS — E079 Disorder of thyroid, unspecified: Secondary | ICD-10-CM | POA: Insufficient documentation

## 2011-10-06 DIAGNOSIS — Z113 Encounter for screening for infections with a predominantly sexual mode of transmission: Secondary | ICD-10-CM | POA: Insufficient documentation

## 2011-10-06 LAB — URINALYSIS, ROUTINE W REFLEX MICROSCOPIC
Nitrite: NEGATIVE
Specific Gravity, Urine: 1.023 (ref 1.005–1.030)
Urobilinogen, UA: 0.2 mg/dL (ref 0.0–1.0)
pH: 5 (ref 5.0–8.0)

## 2011-10-06 LAB — WET PREP, GENITAL
Clue Cells Wet Prep HPF POC: NONE SEEN
Trich, Wet Prep: NONE SEEN

## 2011-10-06 LAB — URINE MICROSCOPIC-ADD ON

## 2011-10-06 LAB — PREGNANCY, URINE: Preg Test, Ur: NEGATIVE

## 2011-10-06 MED ORDER — ONDANSETRON 4 MG PO TBDP: 4.0000 mg | ORAL_TABLET | Freq: Once | ORAL | Status: DC

## 2011-10-06 MED ORDER — ONDANSETRON 4 MG PO TBDP
ORAL_TABLET | ORAL | Status: AC
  Filled 2011-10-06: qty 1

## 2011-10-06 MED ORDER — HYDROCODONE-ACETAMINOPHEN 5-325 MG PO TABS: 1.0000 | ORAL_TABLET | Freq: Three times a day (TID) | ORAL | Status: DC | PRN

## 2011-10-06 MED ORDER — HYDROCODONE-ACETAMINOPHEN 5-325 MG PO TABS
1.0000 | ORAL_TABLET | Freq: Once | ORAL | Status: DC
  Filled 2011-10-06 (×3): qty 1

## 2011-10-06 NOTE — ED Notes (Signed)
Pt sts that she has L flank pain for several days that now radiates to her back. Denies any abd pain, n/v/d or trouble with urination. Pt sts she has had back pain in past.

## 2011-10-06 NOTE — ED Notes (Signed)
Pelvic cart set up and waiting edp.

## 2011-10-06 NOTE — ED Notes (Signed)
Pelvic exam done.

## 2011-10-06 NOTE — ED Notes (Signed)
Pt request to be checked for STD's. Denies any vaginal symptoms

## 2011-10-06 NOTE — ED Provider Notes (Signed)
History     CSN: [id number]  Arrival date & time 10/06/11  0501   First MD Initiated Contact with Patient 10/06/11 [phone]      Chief Complaint  Patient presents with  . Flank Pain     The history is provided by the patient.  The pt is a 46 y/o F with a hx of metastatic BrCa who presents to the ER with a c/c of bilateral low back pain x several days. Pain aching, worse with movement, moderate intensity, intermittent. No known injury or recent heavy lifting. Denies fever, chills, abd pain, dysuria, hematuria, hx kidney stones, saddle anesthesia, leg numbness/weakness, bowel incontinence, or urinary retention. Has taken ultram and tried a heating pad without relief. Also with request for STD testing. Reports recently discovered her sexual partner has not been faithful. No known STD in partner. No vaginal d/c, itching, odor, pain.   Past Medical History  Diagnosis Date  . Breast lump   . Thyroid disease   . Hypertension   . Cancer     breast ca    Past Surgical History  Procedure Date  . Knee arthroscopy     left  . Breast surgery     left    No family history on file.  History  Substance Use Topics  . Smoking status: Former Games developer  . Smokeless tobacco: Not on file  . Alcohol Use: No     Review of Systems 10 systems reviewed and are negative for acute change except as noted in the HPI.  Allergies  Review of patient's allergies indicates no known allergies.  Home Medications   Current Outpatient Rx  Name Route Sig Dispense Refill  . HYDROCODONE-ACETAMINOPHEN 5-325 MG PO TABS Oral Take 1 tablet by mouth every 8 (eight) hours as needed. 30 tablet 0  . LEVOTHYROXINE SODIUM 150 MCG PO TABS Oral Take 1 tablet (150 mcg total) by mouth daily. 30 tablet 11  . LORAZEPAM 0.5 MG PO TABS Oral Take 1 tablet (0.5 mg total) by mouth 2 (two) times daily as needed. 60 tablet 1  . ONDANSETRON HCL 8 MG PO TABS Oral Take 1 tablet (8 mg total) by mouth every 12 (twelve) hours as needed.  30 tablet 0  . OXYCODONE-ACETAMINOPHEN 2.5-325 MG PO TABS Oral Take 1 tablet by mouth every 4 (four) hours as needed. 90 tablet 0  . TAMOXIFEN CITRATE 20 MG PO TABS Oral Take 1 tablet (20 mg total) by mouth daily. 90 tablet 3  . TRAMADOL HCL 50 MG PO TABS Oral Take 50 mg by mouth every 6 (six) hours as needed.    . VENLAFAXINE HCL 75 MG PO TABS Oral Take 75 mg by mouth 2 (two) times daily.      BP 117/77  Pulse 84  Temp(Src) 98.8 F (37.1 C) (Oral)  Resp 18  SpO2 99%  Physical Exam  Nursing note and vitals reviewed. Constitutional: She is oriented to person, place, and time. She appears well-developed and well-nourished.       Uncomfortable appearing  HENT:  Head: Normocephalic and atraumatic.  Mouth/Throat: Oropharynx is clear and moist.  Eyes: Pupils are equal, round, and reactive to light.  Neck: Normal range of motion. Neck supple.  Cardiovascular: Normal rate and regular rhythm.   Pulmonary/Chest: Effort normal. No respiratory distress.  Abdominal: Soft. Bowel sounds are normal. She exhibits no distension. There is no tenderness. There is no rebound and no guarding.  Genitourinary: There is no rash, tenderness or lesion on  the right labia. There is no rash, tenderness or lesion on the left labia. Cervix exhibits no motion tenderness. Right adnexum displays no mass and no tenderness. Left adnexum displays no mass and no tenderness. No tenderness or bleeding around the vagina. No foreign body around the vagina. No vaginal discharge found.  Musculoskeletal: She exhibits no edema and no tenderness.       Full active ROM, though pt is in visible distress with trunk flex/ext/rotation and c/o pain to lower back. Entire spine without tenderness, stepoff, deformity, or palpable spasm. BLE strength 5/5.  Neurological: She is alert and oriented to person, place, and time. She has normal reflexes. Coordination normal.       Normal gait  Skin: Skin is warm and dry. No rash noted.    Psychiatric: She has a normal mood and affect.    ED Course  Procedures (including critical care time)  Labs Reviewed  URINALYSIS, ROUTINE W REFLEX MICROSCOPIC - Abnormal; Notable for the following:    Leukocytes, UA SMALL (*)    All other components within normal limits  WET PREP, GENITAL - Abnormal; Notable for the following:    Yeast Wet Prep HPF POC FEW (*)    WBC, Wet Prep HPF POC FEW (*)    All other components within normal limits  URINE MICROSCOPIC-ADD ON - Abnormal; Notable for the following:    Squamous Epithelial / LPF FEW (*)    All other components within normal limits  PREGNANCY, URINE  GC/CHLAMYDIA PROBE AMP, GENITAL   Dg Lumbar Spine Complete  10/06/2011  *RADIOLOGY REPORT*  Clinical Data: Left low back pain, history breast cancer  LUMBAR SPINE - COMPLETE 4+ VIEW  Comparison: 05/21/2011  Findings: Five non-rib bearing lumbar vertebrae. Osseous demineralization. Minimal facet degenerative changes lower lumbar spine. Disc space narrowing L4-L5. Deformity at anterior aspect inferior endplate T12 stable. Vertebral body heights otherwise maintained. No acute fracture, subluxation or bone destruction. No spondylolysis. Visualized portion of pelvis grossly intact.  IMPRESSION: Osseous demineralization. Mild degenerative disc and facet disease changes lower lumbar spine. No acute abnormalities.  Original Report Authenticated By: Lollie Marrow, M.D.   Dg Sacrum/coccyx  10/06/2011  *RADIOLOGY REPORT*  Clinical Data: Left low back pain, history breast cancer  SACRUM AND COCCYX - 2+ VIEW  Comparison: 05/21/2011  Findings: Osseous demineralization. Hip and SI joints symmetric and preserved. No definite fracture or bone destruction. Visualized portions of pelvis appear intact.  IMPRESSION: No acute abnormalities. Osseous demineralization. If patient has persistent sacrococcygeal pain, consider MR imaging.  Original Report Authenticated By: Lollie Marrow, M.D.     1. Back pain   2.  Screening examination for STD (sexually transmitted disease)          MDM  Back pain in pt with CA hx. Imaging studies reviewed, no acute findings. Discussed with pt that she should still get NM scan as planned at next heme/onc visit (which i strongly suggested she call today to schedule) as plain films can miss small lesions. No gross motor or neuro deficit. Pain improved with hydrocodone (chronic med for pt which she has run out of)- I will write her for a short rx course of this.  Urinalysis reviewed, appears contaminated with few leuk and few epithelials but no bacteria. Will not tx. Wet prep with few yeast, advised pt not necessary to treat as she is asymptomatic but if itching or increased d/c, she can use OTC medication as needed. Advised to go to health department for  syphilis/HIV testing.   Pt voices understanding of above plan. Will d/c home.        Shaaron Adler, PA-C 10/06/11 0847  Shaaron Adler, PA-C 10/06/11 [phone]

## 2011-10-07 ENCOUNTER — Ambulatory Visit: Payer: Self-pay | Admitting: Physician Assistant

## 2011-10-07 ENCOUNTER — Other Ambulatory Visit: Payer: Self-pay

## 2011-10-07 ENCOUNTER — Ambulatory Visit: Payer: Self-pay

## 2011-10-07 NOTE — Progress Notes (Signed)
[facility] today.  Letter mailed to patient.  

## 2011-10-13 NOTE — ED Provider Notes (Signed)
Medical screening examination/treatment/procedure(s) were performed by non-physician practitioner and as supervising physician I was immediately available for consultation/collaboration.   Loren Racer, MD 10/13/11 2049

## 2011-10-15 ENCOUNTER — Ambulatory Visit (HOSPITAL_BASED_OUTPATIENT_CLINIC_OR_DEPARTMENT_OTHER): Payer: Medicare Other

## 2011-10-15 ENCOUNTER — Ambulatory Visit (HOSPITAL_BASED_OUTPATIENT_CLINIC_OR_DEPARTMENT_OTHER): Payer: Medicare Other | Admitting: Physician Assistant

## 2011-10-15 ENCOUNTER — Encounter: Payer: Self-pay | Admitting: Physician Assistant

## 2011-10-15 ENCOUNTER — Telehealth: Payer: Self-pay | Admitting: *Deleted

## 2011-10-15 ENCOUNTER — Other Ambulatory Visit: Payer: Medicaid Other | Admitting: Lab

## 2011-10-15 VITALS — BP 127/85 | HR 87 | Temp 97.9°F | Ht 69.0 in | Wt 160.6 lb

## 2011-10-15 DIAGNOSIS — C50919 Malignant neoplasm of unspecified site of unspecified female breast: Secondary | ICD-10-CM

## 2011-10-15 DIAGNOSIS — Z170 Estrogen receptor positive status [ER+]: Secondary | ICD-10-CM

## 2011-10-15 DIAGNOSIS — C7952 Secondary malignant neoplasm of bone marrow: Secondary | ICD-10-CM

## 2011-10-15 DIAGNOSIS — Z79810 Long term (current) use of selective estrogen receptor modulators (SERMs): Secondary | ICD-10-CM

## 2011-10-15 DIAGNOSIS — F410 Panic disorder [episodic paroxysmal anxiety] without agoraphobia: Secondary | ICD-10-CM

## 2011-10-15 DIAGNOSIS — C800 Disseminated malignant neoplasm, unspecified: Secondary | ICD-10-CM

## 2011-10-15 LAB — COMPREHENSIVE METABOLIC PANEL
CO2: 32 mEq/L (ref 19–32)
Creatinine, Ser: 1.1 mg/dL (ref 0.50–1.10)
Glucose, Bld: 87 mg/dL (ref 70–99)
Total Bilirubin: 0.1 mg/dL — ABNORMAL LOW (ref 0.3–1.2)

## 2011-10-15 LAB — CBC WITH DIFFERENTIAL/PLATELET
Basophils Absolute: 0 10*3/uL (ref 0.0–0.1)
Eosinophils Absolute: 0.1 10*3/uL (ref 0.0–0.5)
HCT: 33.5 % — ABNORMAL LOW (ref 34.8–46.6)
LYMPH%: 18 % (ref 14.0–49.7)
MONO#: 0.4 10*3/uL (ref 0.1–0.9)
NEUT#: 4.1 10*3/uL (ref 1.5–6.5)
NEUT%: 73.7 % (ref 38.4–76.8)
Platelets: 291 10*3/uL (ref 145–400)
WBC: 5.6 10*3/uL (ref 3.9–10.3)

## 2011-10-15 MED ORDER — TRAMADOL HCL 50 MG PO TABS: 50.0000 mg | ORAL_TABLET | Freq: Four times a day (QID) | ORAL | Status: DC | PRN

## 2011-10-15 MED ORDER — LORAZEPAM 1 MG PO TABS
0.5000 mg | ORAL_TABLET | Freq: Once | ORAL | Status: AC
  Administered 2011-10-15: 0.5 mg via ORAL

## 2011-10-15 MED ORDER — ZOLEDRONIC ACID 4 MG/100ML IV SOLN
4.0000 mg | Freq: Once | INTRAVENOUS | Status: AC
  Administered 2011-10-15: 4 mg via INTRAVENOUS
  Filled 2011-10-15: qty 100

## 2011-10-15 MED ORDER — LORAZEPAM 0.5 MG PO TABS: 0.5000 mg | ORAL_TABLET | Freq: Two times a day (BID) | ORAL | Status: DC | PRN

## 2011-10-15 MED ORDER — SODIUM CHLORIDE 0.9 % IV SOLN
Freq: Once | INTRAVENOUS | Status: AC
  Administered 2011-10-15: 14:00:00 via INTRAVENOUS

## 2011-10-15 NOTE — Telephone Encounter (Signed)
gave patient appointments for scans for 10-18-2011 and 10-19-2011 printed out calendar and gave to the patient 

## 2011-10-15 NOTE — Progress Notes (Signed)
Hematology and Oncology Follow Up Visit  Kristin [name] [id number] [birth year] 46 y.o. 10/15/2011    HPI: Kristin [name] was originally seen by Dr. Darnelle Catalan in the hospital for an initial consult on September 17, 2009.  At that time, she presented with back pain and was found to have a markedly abnormal left breast and axilla as well as lytic bone lesions.  We obtained a biopsy of the left breast on February 8th which showed a high-grade tumor which was ER 100% receptor positive but Her-2 and progesterone receptor negative.  The proliferation fraction was elevated at 33%.  We then obtained a bone biopsy on February 11th and her T12 vertebral body indeed was found to harbor an estrogen receptor positive, progesterone receptor negative carcinoma consistent with her breast primary.    She had complete staging studies including a brain MRI with and without contrast which showed no evidence of metastatic disease to the brain, and a CT of the abdomen and pelvis which showed no liver lesions and no other lesions other than in the bones.  There was a fibroid uterus incidentally noted.  She had a CT of the chest previously (February 7th) which showed only some chronic appearing changes in the left upper lobe, consistent with her history of treated tuberculosis.  Again, there were multiple lytic bone lesions.    Kristin [name] was treated in the neoadjuvant setting of docetaxel cyclophosphamide and doxorubicin x6, completed June 2011. She also received radiation therapy to T3 through T12.  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 Grade 2, negative sentinel lymph node.  Patient was started on tamoxifen in July 2011, in addition to zoledronic acid which is been given monthly.  Interim History:   Kristin [name] returns today accompanied by her sister for followup of her metastatic breast carcinoma. She continues  on tamoxifen, 20 mg daily, which she tolerates well, with no significant hot flashes, no change in vision, no peripheral swelling or and evidence of abnormal clotting.   Kristin [name] has also been receiving zoledronic acid, last dose given December 19. This has been held since December secondary to some dental issues, a lost filling, and the fact that she had not been able to see a dentist for further evaluation. She was seen 3 and half weeks ago by her dentist, had her bottom tooth filled, and had no additional dental issues. There were no extractions or invasive dental procedures. She denies any current jaw pain. She is ready to resume the zoledronic acid.  Interim history is also remarkable for Kristin [name] having been seen in the emergency room one week ago due to "flank pain". She was fully evaluated, including an x-ray of the lumbar spine and sacrum. These showed demineralization, degenerative disease, but no acute abnormalities. MRI was recommended by the emergency room physician for further evaluation if pain continued. She continues to have pain, especially in the lower back and the right hip area today. I had received a refill request from her pharmacy for tramadol and that was refilled earlier today. I will note that Kristin [name] had previously been scheduled for MRIs of the lumbar and thoracic spine in November, then for a chest CT and bone scan in January. She had canceled all of these scans, and has had no true restaging scans since July of 2012.  Kristin [name] continues to have some anxiety for which she utilizes lorazepam which she needed  refilled. She has been evaluated at behavioral health since her last visit here. She is very concerned about her weight loss (She has lost 1 pound since her visit here 6 weeks ago, although she has lost approximately 10 pounds in the past year.) She tells me she is eating, and has been trying to drink supplements like Ensure. "It just doesn't work". She denies any significant  nausea or emesis. She's having regular bowel movements. She's had no abdominal pain. No evidence of abnormal bleeding.  A detailed review of systems is otherwise noncontributory as noted below.  Review of Systems: Constitutional:  Fatigue, depression; no fever, night sweats Eyes: negative ION:[id number] Cardiovascular: no chest pain or dyspnea on exertion Respiratory: no cough, shortness of breath, or wheezing Neurological: negative Dermatological: negative Gastrointestinal: no abdominal pain, nausea, change in bowel habits, or black or bloody stools Genito-Urinary: no dysuria, trouble voiding, or hematuria Hematological and Lymphatic: negative Breast: negative Musculoskeletal: positive for - pain in back - lower and oain in hip - right Remaining ROS negative.  Family History: Patient's mother is alive and in her mid 22s. She has a history of treated tuberculosis. The patient's father died from a drug overdose, and the patient does not know much about him. She has one brother with hypertension and one sister, Kristin [name]. There is no known history of breast or ovarian cancer in the family.  Gynecologic History: Gx P1, first pregnancy to term at age 70. Her son was premature.  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 She is currently unemployed. She has been living with her mother, Kristin [name]. The patient's son is in high school. The patient is a Control and instrumentation engineer.   Medications:   I have reviewed the patient's current medications.  Current Outpatient Prescriptions  Medication Sig Dispense Refill  . HYDROcodone-acetaminophen (NORCO) 5-325 MG per tablet Take 1 tablet by mouth every 8 (eight) hours as needed.  12 tablet  0  . HYDROcodone-acetaminophen (NORCO) 5-325 MG per tablet Take 1 tablet by mouth every 8 (eight) hours as needed. For pain      . levothyroxine (SYNTHROID, LEVOTHROID) 150 MCG tablet Take 1 tablet (150 mcg total) by mouth  daily.  30 tablet  11  . LORazepam (ATIVAN) 0.5 MG tablet Take 1 tablet (0.5 mg total) by mouth 2 (two) times daily as needed for anxiety. For pain  30 tablet  0  . ondansetron (ZOFRAN) 8 MG tablet Take 8 mg by mouth every 12 (twelve) hours as needed. For nausea      . oxycodone-acetaminophen (PERCOCET) 2.5-325 MG per tablet Take 1 tablet by mouth every 4 (four) hours as needed. For pain      . tamoxifen (NOLVADEX) 20 MG tablet Take 1 tablet (20 mg total) by mouth daily.  90 tablet  3  . traMADol (ULTRAM) 50 MG tablet Take 1 tablet (50 mg total) by mouth every 6 (six) hours as needed. For pain  120 tablet  0  . venlafaxine (EFFEXOR-XR) 75 MG 24 hr capsule Take 75 mg by mouth daily.       No current facility-administered medications for this visit.   Facility-Administered Medications Ordered in Other Visits  Medication Dose Route Frequency Provider Last Rate Last Dose  . LORazepam (ATIVAN) tablet 0.5 mg  0.5 mg Oral Once Catalina Gravel, PA        Allergies: No Known Allergies  Physical Exam: Filed Vitals:   10/15/11 1225  BP: 127/85  Pulse: 87  Temp: 97.9 F (36.6 C)   HEENT:  Sclerae anicteric, conjunctivae pink.  Oropharynx clear.  No mucositis or candidiasis.   Nodes:  No cervical, supraclavicular, or axillary lymphadenopathy palpated.  Breast Exam:  Deferred. Lungs:  Clear to auscultation bilaterally.  No crackles, rhonchi, or wheezes.   Heart:  Regular rate and rhythm.   Abdomen:  Soft, nontender.  Positive bowel sounds.  No organomegaly or masses palpated.   Musculoskeletal:  No focal spinal tenderness to gentle palpation.  Extremities:  Benign.  No peripheral edema or cyanosis.   Skin:  Benign.   Neuro:  Nonfocal.   Lab Results: Lab Results  Component Value Date   WBC 5.6 10/15/2011   HGB 10.9* 10/15/2011   HCT 33.5* 10/15/2011   MCV 88.3 10/15/2011   PLT 291 10/15/2011   NEUTROABS 4.1 10/15/2011     Chemistry      Component Value Date/Time   NA 140 09/02/2011 0844   K 4.0  09/02/2011 0844   CL 102 09/02/2011 0844   CO2 32 09/02/2011 0844   BUN 19 09/02/2011 0844   CREATININE 1.31* 09/02/2011 0844      Component Value Date/Time   CALCIUM 9.9 09/02/2011 0844   ALKPHOS 74 09/02/2011 0844   AST 22 09/02/2011 0844   ALT 21 09/02/2011 0844   BILITOT 0.2* 09/02/2011 0844      Labs from 09/02/2011 show an increase in her tumor marker, up to 111 in January, from 21 in August and 55 in October.  Radiological Studies:  10/06/2011 *RADIOLOGY REPORT*  Clinical Data: Left low back pain, history breast cancer  LUMBAR SPINE - COMPLETE 4+ VIEW  Comparison: 05/21/2011  Findings:  Five non-rib bearing lumbar vertebrae.  Osseous demineralization.  Minimal facet degenerative changes lower lumbar spine.  Disc space narrowing L4-L5.  Deformity at anterior aspect inferior endplate T12 stable.  Vertebral body heights otherwise maintained.  No acute fracture, subluxation or bone destruction.  No spondylolysis.  Visualized portion of pelvis grossly intact.  IMPRESSION:  Osseous demineralization.  Mild degenerative disc and facet disease changes lower lumbar  spine.  No acute abnormalities.  Original Report Authenticated By: Lollie Marrow, M.D.  10/06/2011 *RADIOLOGY REPORT*  Clinical Data: Left low back pain, history breast cancer  SACRUM AND COCCYX - 2+ VIEW  Comparison: 05/21/2011  Findings:  Osseous demineralization.  Hip and SI joints symmetric and preserved.  No definite fracture or bone destruction.  Visualized portions of pelvis appear intact.  IMPRESSION:  No acute abnormalities.  Osseous demineralization.  If patient has persistent sacrococcygeal pain, consider MR imaging.  Original Report Authenticated By: Lollie Marrow, M.D.    Assessment:  A 46 year old [place] woman with history of stage IV breast carcinoma, initially diagnosed in February 2011.  Evidence of bony metastatic disease at diagnosis.  Treated with docetaxel, cyclophosphamide and  doxorubicin x6, completed in June 2011.  Status post lumpectomy and sentinel lymph node biopsy in August 2011 for a 2-mm focus of residual invasive ductal carcinoma.  Grade 2, negative sentinel lymph node.  On tamoxifen since July 2011, and on zoledronic acid given monthly as well. The patient is also status post radiation to T3 through T12. Most recent dose of zoledronic acid given on 07/28/11.  Plan:  Neira will proceed treatment today as scheduled for her next dose of zoledronic acid, and we will hope to resume monthly dosing as before. She'll continue on tamoxifen as well.  I have explained to [name] again  the importance of restaging scans to ensure disease stability. We've scheduled her for MRIs of the lumbar spine and sacrum to evaluate her recent onset of pain. We also ordered a bone scan as well as a chest CT for restaging.  The studies will be obtained within the next week, after which she'll return to review those results and discuss her treatment plan.   This plan was reviewed with the patient, who voices understanding and agreement.  She knows to call with any changes or problems.    Lakina Mcintire, PA-C 10/15/2011

## 2011-10-15 NOTE — Progress Notes (Signed)
Pt arrived in clinic at 1330 and was dry heaving and doubled over making loud heaving noises with no emesis.  0.5mg  Ativan PO was already ordered for pt, administered PO Ativan and pt was still in distress, per Zollie Scale, okay to give another 0.5mg  Ativan PO.  Provided emotional support, refreshment, and snack, after a few minutes pt sitting comfortably in recliner.  SLJ

## 2011-10-18 ENCOUNTER — Telehealth: Payer: Self-pay | Admitting: *Deleted

## 2011-10-18 ENCOUNTER — Ambulatory Visit (HOSPITAL_COMMUNITY): Payer: Self-pay

## 2011-10-18 ENCOUNTER — Ambulatory Visit (HOSPITAL_COMMUNITY)
Admission: RE | Admit: 2011-10-18 | Discharge: 2011-10-18 | Disposition: A | Discharge status: Home / Self Care | Payer: Medicare Other | Source: Ambulatory Visit | Attending: Physician Assistant | Admitting: Physician Assistant

## 2011-10-18 DIAGNOSIS — M5126 Other intervertebral disc displacement, lumbar region: Secondary | ICD-10-CM | POA: Insufficient documentation

## 2011-10-18 DIAGNOSIS — M545 Low back pain, unspecified: Secondary | ICD-10-CM | POA: Insufficient documentation

## 2011-10-18 DIAGNOSIS — C50919 Malignant neoplasm of unspecified site of unspecified female breast: Secondary | ICD-10-CM | POA: Insufficient documentation

## 2011-10-18 DIAGNOSIS — C7951 Secondary malignant neoplasm of bone: Secondary | ICD-10-CM | POA: Insufficient documentation

## 2011-10-18 DIAGNOSIS — C7952 Secondary malignant neoplasm of bone marrow: Secondary | ICD-10-CM | POA: Insufficient documentation

## 2011-10-18 DIAGNOSIS — M79609 Pain in unspecified limb: Secondary | ICD-10-CM | POA: Insufficient documentation

## 2011-10-18 DIAGNOSIS — M8708 Idiopathic aseptic necrosis of bone, other site: Secondary | ICD-10-CM | POA: Insufficient documentation

## 2011-10-18 MED ORDER — GADOBENATE DIMEGLUMINE 529 MG/ML IV SOLN
14.0000 mL | Freq: Once | INTRAVENOUS | Status: AC | PRN
  Administered 2011-10-18: 14 mL via INTRAVENOUS

## 2011-10-18 NOTE — Telephone Encounter (Signed)
Kristin [name] notified of previous call.  Verbal order received and read back from Ms. Allyson Sabal PA-C for a lumbar sacral spine MRI for restaging for mets to be done tonight.  Deb notified and says she will place this order.

## 2011-10-18 NOTE — Telephone Encounter (Signed)
Patient scheduled for MRI tonight at 7:0pm.  There is an order for lumbar and a seperate order for sacral SI joint.  The MRI Tech, Deb asked if mets is what is being assessed.  If so then a single L/S spine can be done.  Will notify providers.  Noted patient's c/o back pain with last office visit.

## 2011-10-19 ENCOUNTER — Encounter (HOSPITAL_COMMUNITY): Payer: Self-pay

## 2011-10-19 ENCOUNTER — Other Ambulatory Visit: Payer: Self-pay | Admitting: Oncology

## 2011-10-19 ENCOUNTER — Telehealth: Payer: Self-pay | Admitting: *Deleted

## 2011-10-19 ENCOUNTER — Other Ambulatory Visit: Payer: Self-pay | Admitting: *Deleted

## 2011-10-19 ENCOUNTER — Encounter (HOSPITAL_COMMUNITY)
Admission: RE | Admit: 2011-10-19 | Discharge: 2011-10-19 | Disposition: A | Discharge status: Home / Self Care | Payer: Medicare Other | Source: Ambulatory Visit | Attending: Physician Assistant | Admitting: Physician Assistant

## 2011-10-19 ENCOUNTER — Other Ambulatory Visit (HOSPITAL_COMMUNITY): Payer: Self-pay

## 2011-10-19 ENCOUNTER — Ambulatory Visit (HOSPITAL_COMMUNITY): Payer: Self-pay

## 2011-10-19 DIAGNOSIS — C50919 Malignant neoplasm of unspecified site of unspecified female breast: Secondary | ICD-10-CM | POA: Insufficient documentation

## 2011-10-19 DIAGNOSIS — Z923 Personal history of irradiation: Secondary | ICD-10-CM | POA: Insufficient documentation

## 2011-10-19 DIAGNOSIS — R918 Other nonspecific abnormal finding of lung field: Secondary | ICD-10-CM | POA: Insufficient documentation

## 2011-10-19 DIAGNOSIS — M8448XA Pathological fracture, other site, initial encounter for fracture: Secondary | ICD-10-CM | POA: Insufficient documentation

## 2011-10-19 DIAGNOSIS — J438 Other emphysema: Secondary | ICD-10-CM | POA: Insufficient documentation

## 2011-10-19 DIAGNOSIS — R599 Enlarged lymph nodes, unspecified: Secondary | ICD-10-CM | POA: Insufficient documentation

## 2011-10-19 DIAGNOSIS — Z9221 Personal history of antineoplastic chemotherapy: Secondary | ICD-10-CM | POA: Insufficient documentation

## 2011-10-19 DIAGNOSIS — C7951 Secondary malignant neoplasm of bone: Secondary | ICD-10-CM | POA: Insufficient documentation

## 2011-10-19 MED ORDER — DEXAMETHASONE 4 MG PO TABS: 4.0000 mg | ORAL_TABLET | Freq: Two times a day (BID) | ORAL | Status: DC

## 2011-10-19 MED ORDER — IOHEXOL 300 MG/ML  SOLN
80.0000 mL | Freq: Once | INTRAMUSCULAR | Status: AC | PRN
  Administered 2011-10-19: 80 mL via INTRAVENOUS

## 2011-10-19 MED ORDER — OXYCODONE-ACETAMINOPHEN 5-325 MG PO TABS: 2.0000 | ORAL_TABLET | ORAL | Status: AC | PRN

## 2011-10-19 MED ORDER — HYDROMORPHONE HCL PF 1 MG/ML IJ SOLN
1.0000 mg | Freq: Once | INTRAMUSCULAR | Status: AC
Start: 2011-10-19 — End: 2011-10-19
  Administered 2011-10-19 (×2): 1 mg via INTRAVENOUS
  Filled 2011-10-19: qty 1

## 2011-10-19 MED ORDER — TECHNETIUM TC 99M MEDRONATE IV KIT
25.0000 | PACK | Freq: Once | INTRAVENOUS | Status: AC | PRN
  Administered 2011-10-19: 25 via INTRAVENOUS

## 2011-10-19 NOTE — Telephone Encounter (Signed)
This RN was contacted by pt stating she was in the CT dept but in severe pain " I can't walk and I want a shot to get rid of this pain "  This RN informed she needed to speak with the CT dept for orders to be accomadated per their RN.   Call was then received from [facility] - tech in [place] who stated if Dr Darnelle Catalan puts an order in [facility] their RN can Automotive engineer.  Informed MD who placed order in [facility] - this RN called and informed Cordelia Pen of the above.

## 2011-10-20 ENCOUNTER — Other Ambulatory Visit: Payer: Self-pay | Admitting: Oncology

## 2011-10-20 ENCOUNTER — Telehealth: Payer: Self-pay | Admitting: *Deleted

## 2011-10-20 NOTE — Telephone Encounter (Signed)
This RN attempted to call pt per number on demographics.  Spoke with her son who states he is at work presently.  This RN attempted to inquire how pt is today per pain issues yesturday.  He states he believes she is still hurting some and is hoping to get off work early to check on her.  This RN informed him to have [name] call this office if her pain is not controlled or she is not able to walk well.

## 2011-10-21 ENCOUNTER — Ambulatory Visit (HOSPITAL_BASED_OUTPATIENT_CLINIC_OR_DEPARTMENT_OTHER): Payer: Medicaid Other | Admitting: Oncology

## 2011-10-21 ENCOUNTER — Inpatient Hospital Stay (HOSPITAL_COMMUNITY): Admission: AD | Admit: 2011-10-21 | Payer: Medicaid Other | Source: Ambulatory Visit | Admitting: Oncology

## 2011-10-21 ENCOUNTER — Telehealth: Payer: Self-pay | Admitting: *Deleted

## 2011-10-21 ENCOUNTER — Encounter: Payer: Self-pay | Admitting: Radiation Oncology

## 2011-10-21 ENCOUNTER — Other Ambulatory Visit (HOSPITAL_BASED_OUTPATIENT_CLINIC_OR_DEPARTMENT_OTHER): Payer: Medicaid Other | Admitting: Lab

## 2011-10-21 VITALS — BP 133/86 | HR 102 | Temp 98.6°F | Ht 69.0 in | Wt 159.6 lb

## 2011-10-21 DIAGNOSIS — C50919 Malignant neoplasm of unspecified site of unspecified female breast: Secondary | ICD-10-CM

## 2011-10-21 DIAGNOSIS — C800 Disseminated malignant neoplasm, unspecified: Secondary | ICD-10-CM

## 2011-10-21 DIAGNOSIS — G893 Neoplasm related pain (acute) (chronic): Secondary | ICD-10-CM

## 2011-10-21 LAB — CBC WITH DIFFERENTIAL/PLATELET
BASO%: 0.2 % (ref 0.0–2.0)
EOS%: 0.8 % (ref 0.0–7.0)
HGB: 10.8 g/dL — ABNORMAL LOW (ref 11.6–15.9)
MCH: 28.7 pg (ref 25.1–34.0)
MCHC: 32.5 g/dL (ref 31.5–36.0)
MONO%: 5.7 % (ref 0.0–14.0)
RBC: 3.78 10*6/uL (ref 3.70–5.45)
RDW: 15.8 % — ABNORMAL HIGH (ref 11.2–14.5)
lymph#: 1.2 10*3/uL (ref 0.9–3.3)

## 2011-10-21 LAB — COMPREHENSIVE METABOLIC PANEL
ALT: 10 U/L (ref 0–35)
AST: 17 U/L (ref 0–37)
Albumin: 3.5 g/dL (ref 3.5–5.2)
Alkaline Phosphatase: 69 U/L (ref 39–117)
Calcium: 9.2 mg/dL (ref 8.4–10.5)
Chloride: 108 mEq/L (ref 96–112)
Potassium: 3.5 mEq/L (ref 3.5–5.3)
Sodium: 143 mEq/L (ref 135–145)

## 2011-10-21 MED ORDER — DEXAMETHASONE 4 MG PO TABS: 4.0000 mg | ORAL_TABLET | Freq: Two times a day (BID) | ORAL | Status: AC

## 2011-10-21 NOTE — Progress Notes (Signed)
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 Darnelle Catalan for consideration of palliative radiotherapy to her lumbar spine. She completed a course of palliative radiotherapy to her thoracic spine on 11/06/2009, 3000 cGy in 15 sessions. She also received palliative radiation therapy to her left breast completed on 07/08/2010, having received 5040 cGy in 20 sessions to the left breast with a boost of 1000 cGy in 5 sessions. More recently, she has been on tamoxifen. She gives a two-month history of worsening low back pain which she describes as radiating to her right hip and also down her left lower extremity to the knee. She has been wearing a brace along her left thigh to help her with her ambulation. She had been taking hydrocodone when necessary but was recently prescribed Percocet and dexamethasone. MRI of the lumbar spine on 10/19/2011 showed progressive bone metastases involving all the lumbar vertebra, in the pelvis. There is extensive involvement of the L5 vertebral body and posterior elements with epidural tumor on the left side impinging on the left L5 nerve root.  Physical examination: Alert and oriented.  Wt Readings from Last 3 Encounters:  10/21/11 159 lb 9.6 oz (72.394 kg)  10/15/11 160 lb 9.6 oz (72.848 kg)  09/02/11 161 lb 11.2 oz (73.347 kg)   Temp Readings from Last 3 Encounters:  10/21/11 98.6 F (37 C) Oral  10/15/11 97.9 F (36.6 C) Oral  10/06/11 97.7 F (36.5 C) Oral   BP Readings from Last 3 Encounters:  10/21/11 133/86  10/15/11 127/85  10/06/11 122/80   Pulse Readings from Last 3 Encounters:  10/21/11 102  10/15/11 87  10/06/11 88   Head and neck examination: She wears a wig. Back: There is palpable discomfort from L3 down to the sacrum. The pain is most severe at approximately L3. Extremities: Appears to be slight atrophy of her left thigh muscles .  Neurologic examination: There is slight weakness of left hip flexion, but there is good bilateral ankle dorsi and plantar flexion strength.  There is slightly diminished sensation to light touch along the left L5 dermatome.  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 I feel she would benefit from a to one half week course of palliative radiation therapy. I discussed the potential acute and late toxicities of radiation therapy and she wishes to proceed as outlined. She will be started on dexamethasone today and I'll see her on Monday, March 18 for simulation/treatment planning. Her treatment will be started as soon as possible. Consent will be signed she return for simulation/treatment planning.

## 2011-10-21 NOTE — Telephone Encounter (Signed)
gave patient appointment for 10-26-2011 with amy berry starting at 2:45pm printed out calendar and gave to the patient

## 2011-10-22 NOTE — Telephone Encounter (Signed)
na

## 2011-10-24 NOTE — Progress Notes (Signed)
The patient is BRCA 1-2 negative

## 2011-10-24 NOTE — Progress Notes (Signed)
ID: Chanci Ojala   DOB: 20-Feb-1966  MR#: [id number]  BMW#:[id number]  HISTORY OF PRESENT ILLNESS: Lavilla Delamora was originally seen by Dr. Darnelle Catalan in the hospital for an initial consult on September 17, 2009. At that time, she presented with back pain and was found to have a markedly abnormal left breast and axilla as well as lytic bone lesions. We obtained a biopsy of the left breast on February 8th which showed a high-grade tumor which was ER 100% receptor positive but Her-2 and progesterone receptor negative. The proliferation fraction was elevated at 33%. We then obtained a bone biopsy on February 11th and her T12 vertebral body indeed was found to harbor an estrogen receptor positive, progesterone receptor negative carcinoma consistent with her breast primary.  She had complete staging studies including a brain MRI with and without contrast which showed no evidence of metastatic disease to the brain, and a CT of the abdomen and pelvis which showed no liver lesions and no other lesions other than in the bones. There was a fibroid uterus incidentally noted. She had a CT of the chest previously (February 7th) which showed only some chronic appearing changes in the left upper lobe, consistent with her history of treated tuberculosis. Again, there were multiple lytic bone lesions.  Aedyn was treated in the neoadjuvant setting of docetaxel cyclophosphamide and doxorubicin x6, completed June 2011. She also received radiation therapy to T3 through T12.  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 Grade 2, negative sentinel lymph node. Patient was started on tamoxifen in July 2011, in addition to zoledronic acid which is been given monthly. Her subsequent treatment is as detailed below.  INTERVAL HISTORY: Since the last visit here, Emmaline is back pain has worsened, and we obtained plain films of  the lumb0-sacral spine which were not very informative. We proceeded to full staging studies with CT of the chest, bone scan and MRI of the spine, as deep below. She was seen today with a niece and great niece accompanying her. Her mother also called from [place] during the visit: She is in [place] helping her son, Jaylina brother, who is recovering from an automobile accident.  REVIEW OF SYSTEMS: Though on several occasions recently she has called complaining of excruciating pain, today she tells me she is "fine". She is taking Percocet and that seems to be helping. She denies constipation, nausea, or mental status changes secondary to this. She tells me she is walking "okay", has not fallen or stumbled, has no dizziness, visual changes, photophobia, neck stiffness, cough, phlegm production, pleurisy, shortness of breath, chest pain or pressure, or problems with dysuria, hematuria or difficulty urinating. She denies fever, rash, or bleeding. The detailed review of systems was otherwise stable  PAST MEDICAL HISTORY: Past Medical History  Diagnosis Date  . Breast lump   . Thyroid disease   . Hypertension   . 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 Knee arthroscopy     left  . Breast surgery     left    FAMILY HISTORY Patient's mother is alive and in her mid 68s. She has a history of treated tuberculosis. The patient's father died from a drug overdose, and the patient does not know much about him. She has one brother with hypertension and one sister, Dois Davenport.  There is no known history of breast or ovarian cancer in the family.   GYNECOLOGIC HISTORY: Gx P1, first pregnancy to term at age 12. Her son was premature.  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 She is currently unemployed. She has been living with her mother, Melisse Caetano. The patient's son is in high school. The  patient is a Control and instrumentation engineer.    ADVANCED DIRECTIVES:  HEALTH MAINTENANCE: History  Substance Use Topics  . Smoking status: Current Everyday Smoker  . Smokeless tobacco: Not on file  . Alcohol Use: No     Colonoscopy:  PAP:  Bone density:  Lipid panel:  No Known Allergies  Current Outpatient Prescriptions  Medication Sig Dispense Refill  . levothyroxine (SYNTHROID, LEVOTHROID) 150 MCG tablet Take 1 tablet (150 mcg total) by mouth daily.  30 tablet  11  . LORazepam (ATIVAN) 0.5 MG tablet Take 1 tablet (0.5 mg total) by mouth 2 (two) times daily as needed for anxiety. For pain  30 tablet  0  . ondansetron (ZOFRAN) 8 MG tablet Take 8 mg by mouth every 12 (twelve) hours as needed. For nausea      . oxycodone-acetaminophen (PERCOCET) 2.5-325 MG per tablet Take 1 tablet by mouth every 4 (four) hours as needed. For pain      . oxyCODONE-acetaminophen (PERCOCET) 5-325 MG per tablet Take 2 tablets by mouth every 4 (four) hours as needed for pain.  40 tablet  0  . tamoxifen (NOLVADEX) 20 MG tablet Take 1 tablet (20 mg total) by mouth daily.  90 tablet  3  . dexamethasone (DECADRON) 4 MG tablet Take 1 tablet (4 mg total) by mouth 2 (two) times daily with a meal.  30 tablet  0  . dexamethasone (DECADRON) 4 MG tablet Take 1 tablet (4 mg total) by mouth 2 (two) times daily with a meal.  40 tablet  2  . HYDROcodone-acetaminophen (NORCO) 5-325 MG per tablet Take 1 tablet by mouth every 8 (eight) hours as needed.  12 tablet  0  . HYDROcodone-acetaminophen (NORCO) 5-325 MG per tablet Take 1 tablet by mouth every 8 (eight) hours as needed. For pain      . traMADol (ULTRAM) 50 MG tablet Take 1 tablet (50 mg total) by mouth every 6 (six) hours as needed. For pain  120 tablet  0  . venlafaxine (EFFEXOR-XR) 75 MG 24 hr capsule Take 75 mg by mouth daily.        OBJECTIVE: Middle-aged African American woman  Filed Vitals:   10/21/11 1559  BP: 133/86  Pulse: 102  Temp: 98.6 F (37 C)     Body mass index is  23.57 kg/(m^2).    ECOG FS: 2  Sclerae unicteric Oropharynx clear No peripheral adenopathy Lungs no rales or rhonchi Heart regular rate and rhythm Abd benign MSK minimal spinal tenderness over the lumbar area; no peripheral edema Neuro: She has normal bilateral knee jerks; knee extension and dorsiflexion on the right are 5 over 5, on the left knee extension is limited by pain, is 4/5; dorsiflexion on the left is 5 over 5. Breasts: Deferred  LAB RESULTS: Lab Results  Component Value Date   WBC 6.2 10/21/2011   NEUTROABS 4.6 10/21/2011   HGB 10.8* 10/21/2011   HCT 33.3* 10/21/2011   MCV 88.3 10/21/2011   PLT 335 10/21/2011      Chemistry      Component Value Date/Time   NA 143 10/21/2011 1538  K 3.5 10/21/2011 1538   CL 108 10/21/2011 1538   CO2 29 10/21/2011 1538   BUN 17 10/21/2011 1538   CREATININE 1.04 10/21/2011 1538      Component Value Date/Time   CALCIUM 9.2 10/21/2011 1538   ALKPHOS 69 10/21/2011 1538   AST 17 10/21/2011 1538   ALT 10 10/21/2011 1538   BILITOT 0.1* 10/21/2011 1538       Lab Results  Component Value Date   LABCA2 152* 10/21/2011   CA 27-29 was 55 in October of 2012 and 111 on 08/2011  No components found with this basename: [id number]    No results found for this basename: INR:1;PROTIME:1 in the last 168 hours  Urinalysis    Component Value Date/Time   COLORURINE YELLOW 10/06/2011 0558   APPEARANCEUR CLEAR 10/06/2011 0558   LABSPEC 1.023 10/06/2011 0558   LABSPEC 1.030 09/23/2010 1155   PHURINE 5.0 10/06/2011 0558   GLUCOSEU NEGATIVE 10/06/2011 0558   HGBUR NEGATIVE 10/06/2011 0558   BILIRUBINUR NEGATIVE 10/06/2011 0558   KETONESUR NEGATIVE 10/06/2011 0558   PROTEINUR NEGATIVE 10/06/2011 0558   UROBILINOGEN 0.2 10/06/2011 0558   NITRITE NEGATIVE 10/06/2011 0558   LEUKOCYTESUR SMALL* 10/06/2011 0558    STUDIES: Dg Lumbar Spine Complete  10/06/2011  *RADIOLOGY REPORT*  Clinical Data: Left low back pain, history breast cancer  LUMBAR SPINE - COMPLETE 4+  VIEW  Comparison: 05/21/2011  Findings: Five non-rib bearing lumbar vertebrae. Osseous demineralization. Minimal facet degenerative changes lower lumbar spine. Disc space narrowing L4-L5. Deformity at anterior aspect inferior endplate T12 stable. Vertebral body heights otherwise maintained. No acute fracture, subluxation or bone destruction. No spondylolysis. Visualized portion of pelvis grossly intact.  IMPRESSION: Osseous demineralization. Mild degenerative disc and facet disease changes lower lumbar spine. No acute abnormalities.  Original Report Authenticated By: Lollie Marrow, M.D.   Dg Sacrum/coccyx  10/06/2011  *RADIOLOGY REPORT*  Clinical Data: Left low back pain, history breast cancer  SACRUM AND COCCYX - 2+ VIEW  Comparison: 05/21/2011  Findings: Osseous demineralization. Hip and SI joints symmetric and preserved. No definite fracture or bone destruction. Visualized portions of pelvis appear intact.  IMPRESSION: No acute abnormalities. Osseous demineralization. If patient has persistent sacrococcygeal pain, consider MR imaging.  Original Report Authenticated By: Lollie Marrow, M.D.   Ct Chest W Contrast  10/19/2011  *RADIOLOGY REPORT*  Clinical Data: Metastatic breast cancer.  Metastasis to bone. Chemotherapy and radiation therapy complete.  CT CHEST WITH CONTRAST  Technique:  Multidetector CT imaging of the chest was performed following the standard protocol during bolus administration of intravenous contrast.  Contrast: 80mL OMNIPAQUE IOHEXOL 300 MG/ML IJ SOLN  Comparison: 03/03/2011.  Lumbar spine MR 10/18/2011.  Findings: Lung windows demonstrate probable secretions along the left mainstem bronchus on image 23.  Mild centrilobular emphysema. Mild anteromedial right upper lobe nodularity on image 27, new. This measures 9 mm. A left lower lobe 5 mm nodule on image 40 which is new. Left apical fibrosis with patchy pulmonary opacity.  This extends into the paramediastinal left upper lobe.  Similar  configuration and likely radiation induced.  Soft tissue windows demonstrate no axillary adenopathy.  Surgical clips in the left breast.  Left supraclavicular nodes measure up to 7 mm on image 5 and are new since the prior. A right supraclavicular node measures 7 mm on image 7 and is also new.  Heart size upper normal, without pericardial or pleural effusion.  1.2 cm right paratracheal node is new on image 18. A right  hilar lymph node measures 2.1 cm on image 26 and is new or newly enlarged.  High left paratracheal node measures 1.5 cm on image 8 and is new.  Limited abdominal imaging demonstrates vague hypoattenuation within the right lobe of the liver on image 60.  This measures 9 mm and is new since the prior exam (image 60 of series 2 today).  Normal adrenal glands.  Extensive osseous metastasis. Similar pathologic fracture of the 11th posterolateral right rib.  Grossly similar extent of osseous metastasis.  Complicating compression deformities at T3, T5, and T6.  All similar.  No canal compromise.  A lytic lesion at the T10 level is new since the prior.  1.5 cm on image 42 transverse.  No definite epidural tumor.  IMPRESSION: 1.  Developing adenopathy within the chest and lower neck, highly suspicious for metastatic disease.  2.  Indeterminate bilateral lung nodules.  Especially within the inferior right upper lobe, metastatic disease cannot be excluded. 3.  Vague hypoattenuating area in the right lobe of the liver. Indeterminate and incompletely evaluated.  If definitive characterization is desired, pre and post contrast abdominal MRI should be considered. 4.  Osseous metastasis.  Slightly progressive since 03/03/2011.  A new posterior T10 lytic lesion is without definite epidural extension.  If there are neurologic symptoms to suggest canal involvement at this level, pre and postcontrast thoracic spine MRI should be considered  (T10 not imaged on 10/18/2011 lumbar spine MRI).  Original Report Authenticated  By: Consuello Bossier, M.D.   Mr Lumbar Spine W Wo Contrast  10/19/2011  *RADIOLOGY REPORT*  Clinical Data: Metastatic breast cancer. Back and left leg pain.  MRI LUMBAR SPINE WITHOUT AND WITH CONTRAST  Technique:  Multiplanar and multiecho pulse sequences of the lumbar spine were obtained without and with intravenous contrast.  Contrast: 14mL MULTIHANCE GADOBENATE DIMEGLUMINE 529 MG/ML IV SOLN  Comparison: Lumbar spine radiographs 10/06/2011 and lumbar spine MRI 11/24/2009.  Findings: Progressive osseous metastatic disease is demonstrated with numerous metastasis involving the lumbar vertebral bodies and posterior elements.  The most significant abnormality involves the L5 vertebral body, lamina and facet on the left.  There is tumor extruding into the spinal canal with mass effect on the left side of the thecal sac and likely impinging on the left L5 nerve root. There are also numerous small bone infarcts.  Metastatic disease also involves the sacrum.  There are large iliac bone infarcts along with metastasis.  No focal disc protrusions, spinal or foraminal stenosis at L1-2, L2- 3 or L3-4.  L4-5:  Diffuse bulging degenerated annulus and mild osteophytic ridging with mild flattening of the ventral thecal sac and mild foraminal stenosis bilaterally, right greater than left.  L5-S1:  Small focal central disc protrusion without direct neural compression.  No foraminal stenosis.  IMPRESSION:  1.  Progressive bone metastasis involving all of the lumbar vertebral bodies and the pelvis. 2.  Extensive involvement of the L5 vertebral body and posterior elements with epidural tumor on the left side impinging on the left L5 nerve root. 3.  Multiple small bone infarcts. 4.  No cord lesions.  Original Report Authenticated By: P. Loralie Champagne, M.D.   Nm Bone Scan Whole Body  10/19/2011  *RADIOLOGY REPORT*  Clinical Data: Breast cancer.  No bone metastasis.  NUCLEAR MEDICINE WHOLE BODY BONE SCINTIGRAPHY  Technique:  Whole  body anterior and posterior images were obtained approximately 3 hours after intravenous injection of radiopharmaceutical.  Radiopharmaceutical: 22.5 mCi technetium MDP  Comparison: MRI lumbar  spine 10/18/2011, CT thorax 10/19/2011.  Findings: Uptake within the upper, mid and lower thoracic spine corresponds to the metastasis and pathologic fractures on comparison CT.  Focal uptake within the posterior left iliac bone adjacent to the SI joint is consistent metastasis.  Focus of uptake within the left femoral head consistent metastasis.  A focus of uptake within the posterior right 11th rib consistent with metastasis.  IMPRESSION:  Multiple foci of metastasis within the spine, pelvis and left femoral head correlates to the disease described on comparison MRI and CT.  Original Report Authenticated By: Genevive Bi, M.D.    ASSESSMENT: A 46 year old [place] woman with history of breast cancer stage IV at diagnosis February 2011, with bony metastatic disease.   (1) status post 30 Gy to T-3 through T-18 October 2009  (2) status post docetaxel, cyclophosphamide and doxorubicin x6, completed in June 2011.  (3) status post Left lumpectomy and sentinel lymph node biopsy August 2011 for a 2-mm focus of residual invasive ductal carcinoma, grade 2, negative sentinel lymph node  (4) tamoxifen started July 2011, with zoledronic acid given monthly as well.  (5) status post radiation to Left breast completed November 2011   PLAN: Veida is having progression of her disease. The most worrisome site is the L5 mass, and she has not had radiation to this area. I have discussed the case with Dr. Dayton Scrape and he will see the patient today and set her up for simulation ASAP. Symptomatically we are going to continue her pain medication, which seems to be working for her. I have also started her on dexamethasone, 8 mg twice a day, and this can be tapered off once she completes her radiation  She will see [place] again in  March 19. We will obtain a full set of labs on that day, and we will ask her to stop her tamoxifen. I think she will be a good candidate for fulvestrant and am checking an LH and FSH to confirm her postmenopausal status at this point. If there is any question we will add goserelin. We will follow her lab work as well as clinically with visits monthly for the next several months, until we have clear evidence of response and she is clinically stable. She knows to call for any other problems that may develop before the next visit   Janyth Riera C    10/24/2011

## 2011-10-25 ENCOUNTER — Ambulatory Visit
Admission: RE | Admit: 2011-10-25 | Discharge: 2011-10-25 | Disposition: A | Discharge status: Home / Self Care | Payer: Medicare Other | Source: Ambulatory Visit | Attending: Radiation Oncology | Admitting: Radiation Oncology

## 2011-10-25 ENCOUNTER — Ambulatory Visit: Payer: Medicare Other

## 2011-10-25 VITALS — BP 160/99 | HR 73 | Temp 98.5°F | Resp 18 | Ht 69.0 in | Wt 154.1 lb

## 2011-10-25 DIAGNOSIS — C50919 Malignant neoplasm of unspecified site of unspecified female breast: Secondary | ICD-10-CM

## 2011-10-25 DIAGNOSIS — Z510 Encounter for antineoplastic radiation therapy: Secondary | ICD-10-CM | POA: Insufficient documentation

## 2011-10-25 DIAGNOSIS — C801 Malignant (primary) neoplasm, unspecified: Secondary | ICD-10-CM | POA: Insufficient documentation

## 2011-10-25 DIAGNOSIS — C7952 Secondary malignant neoplasm of bone marrow: Secondary | ICD-10-CM | POA: Insufficient documentation

## 2011-10-25 DIAGNOSIS — C7951 Secondary malignant neoplasm of bone: Secondary | ICD-10-CM | POA: Insufficient documentation

## 2011-10-25 NOTE — Progress Notes (Signed)
Received patient in the clinic today accompanied by a friend for a nurse evaluation prior to simulation. Patient is alert and oriented to person, place, and time. No distress noted. Patient riding in wheelchair due to pain and generalized weakness. Patient stood without difficulty for weight. Left leg brace noted. Patient tearful. Patient reports being "overwhelmed with all these appointments." Provided patient with an updated calendar then, reviewed it. Patient verbalized understanding. Patient seen by Dr. Dayton Scrape and radiation consent obtained. Wheeled patient to CT/SIM.

## 2011-10-25 NOTE — Progress Notes (Signed)
Simulation/treatment planning note:  The patient was taken to the CT simulator and placed supine. An alpha cradle was constructed for immobilization. Her LS spine was scanned. I chose an isocenter. She is setup to AP and PA fields. 2 separate multileaf collimators were designed to conform the field. I prescribing 3500 cGy in 14 sessions utilizing 18 MV photons. An isodose plan and dosimetry are requested.

## 2011-10-26 ENCOUNTER — Other Ambulatory Visit: Payer: Self-pay | Admitting: Lab

## 2011-10-26 ENCOUNTER — Telehealth: Payer: Self-pay | Admitting: Oncology

## 2011-10-26 ENCOUNTER — Ambulatory Visit (HOSPITAL_BASED_OUTPATIENT_CLINIC_OR_DEPARTMENT_OTHER): Payer: Medicaid Other | Admitting: Physician Assistant

## 2011-10-26 ENCOUNTER — Encounter: Payer: Self-pay | Admitting: Physician Assistant

## 2011-10-26 ENCOUNTER — Other Ambulatory Visit (HOSPITAL_BASED_OUTPATIENT_CLINIC_OR_DEPARTMENT_OTHER): Payer: Medicare Other | Admitting: Lab

## 2011-10-26 VITALS — BP 132/87 | HR 77 | Temp 98.6°F | Ht 69.0 in | Wt 158.8 lb

## 2011-10-26 DIAGNOSIS — C7952 Secondary malignant neoplasm of bone marrow: Secondary | ICD-10-CM

## 2011-10-26 DIAGNOSIS — C50919 Malignant neoplasm of unspecified site of unspecified female breast: Secondary | ICD-10-CM

## 2011-10-26 DIAGNOSIS — Z170 Estrogen receptor positive status [ER+]: Secondary | ICD-10-CM

## 2011-10-26 LAB — FOLLICLE STIMULATING HORMONE: FSH: 18.6 m[IU]/mL

## 2011-10-26 LAB — COMPREHENSIVE METABOLIC PANEL
ALT: 12 U/L (ref 0–35)
Alkaline Phosphatase: 75 U/L (ref 39–117)
Sodium: 139 mEq/L (ref 135–145)
Total Bilirubin: 0.2 mg/dL — ABNORMAL LOW (ref 0.3–1.2)
Total Protein: 7.2 g/dL (ref 6.0–8.3)

## 2011-10-26 LAB — CBC WITH DIFFERENTIAL/PLATELET
BASO%: 0 % (ref 0.0–2.0)
LYMPH%: 11.5 % — ABNORMAL LOW (ref 14.0–49.7)
MCH: 28 pg (ref 25.1–34.0)
MCHC: 33.3 g/dL (ref 31.5–36.0)
MCV: 83.9 fL (ref 79.5–101.0)
MONO%: 6.1 % (ref 0.0–14.0)
Platelets: 349 10*3/uL (ref 145–400)
RBC: 4.15 10*6/uL (ref 3.70–5.45)

## 2011-10-26 LAB — TSH: TSH: 3.782 u[IU]/mL (ref 0.350–4.500)

## 2011-10-26 NOTE — Telephone Encounter (Signed)
gv pt appt schedule for march and April. Per val scheduled pt w/GM for 3/27 due to AB has no availability.

## 2011-10-27 ENCOUNTER — Encounter: Payer: Self-pay | Admitting: Radiation Oncology

## 2011-10-27 ENCOUNTER — Ambulatory Visit (HOSPITAL_COMMUNITY): Payer: Self-pay | Admitting: Physician Assistant

## 2011-10-27 ENCOUNTER — Ambulatory Visit: Payer: Medicare Other

## 2011-10-27 ENCOUNTER — Ambulatory Visit
Admission: RE | Admit: 2011-10-27 | Discharge: 2011-10-27 | Disposition: A | Discharge status: Home / Self Care | Payer: Medicare Other | Source: Ambulatory Visit | Attending: Radiation Oncology | Admitting: Radiation Oncology

## 2011-10-27 NOTE — Progress Notes (Signed)
Simulation verification note: The patient underwent similar to verification for treatment to her LS spine. Her isocenter is in good position and the multileaf collimators contoured the treatment fine appropriately.

## 2011-10-27 NOTE — Progress Notes (Signed)
Encounter addended by: Agnes Lawrence, RN on: 10/27/2011  4:36 PM<BR>     Documentation filed: Charges VN, Inpatient Patient Education

## 2011-10-27 NOTE — Progress Notes (Signed)
ID: Kristin [name]   DOB: [birth year]  MR#: [id number]  ONG#:[id number]  HISTORY OF PRESENT ILLNESS: Kristin [name] was originally seen by Dr. Darnelle Catalan in the hospital for an initial consult on September 17, 2009. At that time, she presented with back pain and was found to have a markedly abnormal left breast and axilla as well as lytic bone lesions. We obtained a biopsy of the left breast on February 8th which showed a high-grade tumor which was ER 100% receptor positive but Her-2 and progesterone receptor negative. The proliferation fraction was elevated at 33%. We then obtained a bone biopsy on February 11th and her T12 vertebral body indeed was found to harbor an estrogen receptor positive, progesterone receptor negative carcinoma consistent with her breast primary.  She had complete staging studies including a brain MRI with and without contrast which showed no evidence of metastatic disease to the brain, and a CT of the abdomen and pelvis which showed no liver lesions and no other lesions other than in the bones. There was a fibroid uterus incidentally noted. She had a CT of the chest previously (February 7th) which showed only some chronic appearing changes in the left upper lobe, consistent with her history of treated tuberculosis. Again, there were multiple lytic bone lesions.  Clorine was treated in the neoadjuvant setting of docetaxel cyclophosphamide and doxorubicin x6, completed June 2011. She also received radiation therapy to T3 through T12.  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 Grade 2, negative sentinel lymph node. Patient was started on tamoxifen in July 2011, in addition to zoledronic acid which is been given monthly. Her subsequent treatment is as detailed below.  INTERVAL HISTORY: Kristin [name] presents for a follow up appointment accompanied by her sister. She continues to  complain of back pain and is due start radiation therapy tomorrow. She has not gotten the prescription for dexamethasone filled but states that she has been taking the dexamethasone that Dr. Darnelle Catalan gave her.She is having some stress related to her her son and is smoking about 1 pack of cigarettes daily. Not eating very much.  REVIEW OF SYSTEMS: She is taking Percocet in the evenings and tramadol during the day for her pain and that seems to be helping. She denies constipation, nausea, or mental status changes secondary to this. She denied falls or stumbling but has noted weakness in her left leg, has no dizziness, visual changes, photophobia, neck stiffness, cough, phlegm production, pleurisy, shortness of breath, chest pain or pressure, or problems with dysuria, hematuria or difficulty urinating. She denies fever, rash, or bleeding. The detailed review of systems was otherwise stable  PAST MEDICAL HISTORY: Past Medical History  Diagnosis Date  . Breast lump   . Thyroid disease   . Hypertension   . 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 Knee arthroscopy     left  . Breast surgery     left    FAMILY HISTORY Patient's mother is alive and in her mid 63s. She has a history of treated tuberculosis. The patient's father died from a drug overdose, and the patient does not know much about him. She has one brother with hypertension and one sister, Dois Davenport. There is no known history of breast or ovarian cancer in the family.   GYNECOLOGIC HISTORY: Gx P1, first pregnancy  to term at age 46. Her son was premature.  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 She is currently unemployed. She has been living with her mother, Kristin [name]. The patient's son is in high school. The patient is a Control and instrumentation engineer.    ADVANCED DIRECTIVES:  HEALTH MAINTENANCE: History  Substance Use Topics  .  Smoking status: Current Everyday Smoker  . Smokeless tobacco: Not on file  . Alcohol Use: No     Colonoscopy:  PAP:  Bone density:  Lipid panel:  No Known Allergies  Current Outpatient Prescriptions  Medication Sig Dispense Refill  . dexamethasone (DECADRON) 4 MG tablet Take 1 tablet (4 mg total) by mouth 2 (two) times daily with a meal.  40 tablet  2  . HYDROcodone-acetaminophen (NORCO) 5-325 MG per tablet Take 1 tablet by mouth every 8 (eight) hours as needed.  12 tablet  0  . levothyroxine (SYNTHROID, LEVOTHROID) 150 MCG tablet Take 1 tablet (150 mcg total) by mouth daily.  30 tablet  11  . LORazepam (ATIVAN) 0.5 MG tablet Take 1 tablet (0.5 mg total) by mouth 2 (two) times daily as needed for anxiety. For pain  30 tablet  0  . ondansetron (ZOFRAN) 8 MG tablet Take 8 mg by mouth every 12 (twelve) hours as needed. For nausea      . oxyCODONE-acetaminophen (PERCOCET) 5-325 MG per tablet Take 2 tablets by mouth every 4 (four) hours as needed for pain.  40 tablet  0  . tamoxifen (NOLVADEX) 20 MG tablet Take 1 tablet (20 mg total) by mouth daily.  90 tablet  3  . traMADol (ULTRAM) 50 MG tablet Take 1 tablet (50 mg total) by mouth every 6 (six) hours as needed. For pain  120 tablet  0  . venlafaxine (EFFEXOR-XR) 75 MG 24 hr capsule Take 75 mg by mouth daily.        OBJECTIVE: Middle-aged African American woman  Filed Vitals:   10/26/11 1529  BP: 132/87  Pulse: 77  Temp: 98.6 F (37 C)     Body mass index is 23.45 kg/(m^2).    ECOG FS: 2  Sclerae unicteric Oropharynx clear No peripheral adenopathy Lungs no rales or rhonchi Heart regular rate and rhythm Abd benign MSK minimal spinal tenderness over the lumbar area; no peripheral edema Neuro: She has slightly diminished bilateral knee jerks; knee extension and dorsiflexion on the right are 5 over 5, on the left knee extension is limited by pain, is 4/5; dorsiflexion on the left is 5 over 5. Breasts: Deferred  LAB RESULTS: Lab  Results  Component Value Date   WBC 8.0 10/26/2011   NEUTROABS 6.6* 10/26/2011   HGB 11.6 10/26/2011   HCT 34.8 10/26/2011   MCV 83.9 10/26/2011   PLT 349 10/26/2011      Chemistry      Component Value Date/Time   NA 139 10/26/2011 1453   K 4.1 10/26/2011 1453   CL 103 10/26/2011 1453   CO2 31 10/26/2011 1453   BUN 21 10/26/2011 1453   CREATININE 1.09 10/26/2011 1453      Component Value Date/Time   CALCIUM 9.3 10/26/2011 1453   ALKPHOS 75 10/26/2011 1453   AST 15 10/26/2011 1453   ALT 12 10/26/2011 1453   BILITOT 0.2* 10/26/2011 1453       Lab Results  Component Value Date   LABCA2 179* 10/26/2011   CA 27-29 was 55 in October of 2012 and 111  on 08/2011  No components found with this basename: [id number]    No results found for this basename: INR:1;PROTIME:1 in the last 168 hours  Urinalysis    Component Value Date/Time   COLORURINE YELLOW 10/06/2011 0558   APPEARANCEUR CLEAR 10/06/2011 0558   LABSPEC 1.023 10/06/2011 0558   LABSPEC 1.030 09/23/2010 1155   PHURINE 5.0 10/06/2011 0558   GLUCOSEU NEGATIVE 10/06/2011 0558   HGBUR NEGATIVE 10/06/2011 0558   BILIRUBINUR NEGATIVE 10/06/2011 0558   KETONESUR NEGATIVE 10/06/2011 0558   PROTEINUR NEGATIVE 10/06/2011 0558   UROBILINOGEN 0.2 10/06/2011 0558   NITRITE NEGATIVE 10/06/2011 0558   LEUKOCYTESUR SMALL* 10/06/2011 0558    STUDIES: Dg Lumbar Spine Complete  10/06/2011  *RADIOLOGY REPORT*  Clinical Data: Left low back pain, history breast cancer  LUMBAR SPINE - COMPLETE 4+ VIEW  Comparison: 05/21/2011  Findings: Five non-rib bearing lumbar vertebrae. Osseous demineralization. Minimal facet degenerative changes lower lumbar spine. Disc space narrowing L4-L5. Deformity at anterior aspect inferior endplate T12 stable. Vertebral body heights otherwise maintained. No acute fracture, subluxation or bone destruction. No spondylolysis. Visualized portion of pelvis grossly intact.  IMPRESSION: Osseous demineralization. Mild degenerative disc and facet  disease changes lower lumbar spine. No acute abnormalities.  Original Report Authenticated By: Lollie Marrow, M.D.   Dg Sacrum/coccyx  10/06/2011  *RADIOLOGY REPORT*  Clinical Data: Left low back pain, history breast cancer  SACRUM AND COCCYX - 2+ VIEW  Comparison: 05/21/2011  Findings: Osseous demineralization. Hip and SI joints symmetric and preserved. No definite fracture or bone destruction. Visualized portions of pelvis appear intact.  IMPRESSION: No acute abnormalities. Osseous demineralization. If patient has persistent sacrococcygeal pain, consider MR imaging.  Original Report Authenticated By: Lollie Marrow, M.D.   Ct Chest W Contrast  10/19/2011  *RADIOLOGY REPORT*  Clinical Data: Metastatic breast cancer.  Metastasis to bone. Chemotherapy and radiation therapy complete.  CT CHEST WITH CONTRAST  Technique:  Multidetector CT imaging of the chest was performed following the standard protocol during bolus administration of intravenous contrast.  Contrast: 80mL OMNIPAQUE IOHEXOL 300 MG/ML IJ SOLN  Comparison: 03/03/2011.  Lumbar spine MR 10/18/2011.  Findings: Lung windows demonstrate probable secretions along the left mainstem bronchus on image 23.  Mild centrilobular emphysema. Mild anteromedial right upper lobe nodularity on image 27, new. This measures 9 mm. A left lower lobe 5 mm nodule on image 40 which is new. Left apical fibrosis with patchy pulmonary opacity.  This extends into the paramediastinal left upper lobe.  Similar configuration and likely radiation induced.  Soft tissue windows demonstrate no axillary adenopathy.  Surgical clips in the left breast.  Left supraclavicular nodes measure up to 7 mm on image 5 and are new since the prior. A right supraclavicular node measures 7 mm on image 7 and is also new.  Heart size upper normal, without pericardial or pleural effusion.  1.2 cm right paratracheal node is new on image 18. A right hilar lymph node measures 2.1 cm on image 26 and is new or  newly enlarged.  High left paratracheal node measures 1.5 cm on image 8 and is new.  Limited abdominal imaging demonstrates vague hypoattenuation within the right lobe of the liver on image 60.  This measures 9 mm and is new since the prior exam (image 60 of series 2 today).  Normal adrenal glands.  Extensive osseous metastasis. Similar pathologic fracture of the 11th posterolateral right rib.  Grossly similar extent of osseous metastasis.  Complicating compression deformities at T3, T5,  and T6.  All similar.  No canal compromise.  A lytic lesion at the T10 level is new since the prior.  1.5 cm on image 42 transverse.  No definite epidural tumor.  IMPRESSION: 1.  Developing adenopathy within the chest and lower neck, highly suspicious for metastatic disease.  2.  Indeterminate bilateral lung nodules.  Especially within the inferior right upper lobe, metastatic disease cannot be excluded. 3.  Vague hypoattenuating area in the right lobe of the liver. Indeterminate and incompletely evaluated.  If definitive characterization is desired, pre and post contrast abdominal MRI should be considered. 4.  Osseous metastasis.  Slightly progressive since 03/03/2011.  A new posterior T10 lytic lesion is without definite epidural extension.  If there are neurologic symptoms to suggest canal involvement at this level, pre and postcontrast thoracic spine MRI should be considered  (T10 not imaged on 10/18/2011 lumbar spine MRI).  Original Report Authenticated By: Consuello Bossier, M.D.   Mr Lumbar Spine W Wo Contrast  10/19/2011  *RADIOLOGY REPORT*  Clinical Data: Metastatic breast cancer. Back and left leg pain.  MRI LUMBAR SPINE WITHOUT AND WITH CONTRAST  Technique:  Multiplanar and multiecho pulse sequences of the lumbar spine were obtained without and with intravenous contrast.  Contrast: 14mL MULTIHANCE GADOBENATE DIMEGLUMINE 529 MG/ML IV SOLN  Comparison: Lumbar spine radiographs 10/06/2011 and lumbar spine MRI 11/24/2009.   Findings: Progressive osseous metastatic disease is demonstrated with numerous metastasis involving the lumbar vertebral bodies and posterior elements.  The most significant abnormality involves the L5 vertebral body, lamina and facet on the left.  There is tumor extruding into the spinal canal with mass effect on the left side of the thecal sac and likely impinging on the left L5 nerve root. There are also numerous small bone infarcts.  Metastatic disease also involves the sacrum.  There are large iliac bone infarcts along with metastasis.  No focal disc protrusions, spinal or foraminal stenosis at L1-2, L2- 3 or L3-4.  L4-5:  Diffuse bulging degenerated annulus and mild osteophytic ridging with mild flattening of the ventral thecal sac and mild foraminal stenosis bilaterally, right greater than left.  L5-S1:  Small focal central disc protrusion without direct neural compression.  No foraminal stenosis.  IMPRESSION:  1.  Progressive bone metastasis involving all of the lumbar vertebral bodies and the pelvis. 2.  Extensive involvement of the L5 vertebral body and posterior elements with epidural tumor on the left side impinging on the left L5 nerve root. 3.  Multiple small bone infarcts. 4.  No cord lesions.  Original Report Authenticated By: P. Loralie Champagne, M.D.   Nm Bone Scan Whole Body  10/19/2011  *RADIOLOGY REPORT*  Clinical Data: Breast cancer.  No bone metastasis.  NUCLEAR MEDICINE WHOLE BODY BONE SCINTIGRAPHY  Technique:  Whole body anterior and posterior images were obtained approximately 3 hours after intravenous injection of radiopharmaceutical.  Radiopharmaceutical: 22.5 mCi technetium MDP  Comparison: MRI lumbar spine 10/18/2011, CT thorax 10/19/2011.  Findings: Uptake within the upper, mid and lower thoracic spine corresponds to the metastasis and pathologic fractures on comparison CT.  Focal uptake within the posterior left iliac bone adjacent to the SI joint is consistent metastasis.  Focus of  uptake within the left femoral head consistent metastasis.  A focus of uptake within the posterior right 11th rib consistent with metastasis.  IMPRESSION:  Multiple foci of metastasis within the spine, pelvis and left femoral head correlates to the disease described on comparison MRI and CT.  Original  Report Authenticated By: Genevive Bi, M.D.    ASSESSMENT: A 46 year old [place] woman with history of breast cancer stage IV at diagnosis February 2011, with bony metastatic disease.   (1) status post 30 Gy to T-3 through T-18 October 2009  (2) status post docetaxel, cyclophosphamide and doxorubicin x6, completed in June 2011.  (3) status post Left lumpectomy and sentinel lymph node biopsy August 2011 for a 2-mm focus of residual invasive ductal carcinoma, grade 2, negative sentinel lymph node  (4) tamoxifen started July 2011, with zoledronic acid given monthly as well.  (5) status post radiation to Left breast completed November 2011   PLAN: Alexandrya is having progression of her disease. The most worrisome site is the L5 mass, and she is to start radiation to this area 10/27/2011. I have discussed the case with Dr. Darnelle Catalan.I have emphasized the importance of her staying on the dexamethasone as prescribed. She is to discontinue the Tamoxifen. She will follow up with Zollie Scale, PA-C next week and again with Dr. Darnelle Catalan in approximately 20 days to discuss further treatment options. LH and FSH are still pending to confirm her postmenopausal status. She will be a good candidate for fulvestrant and If there is any question we will add goserelin.    Tiana Loft E    10/27/2011

## 2011-10-28 ENCOUNTER — Ambulatory Visit
Admission: RE | Admit: 2011-10-28 | Discharge: 2011-10-28 | Disposition: A | Discharge status: Home / Self Care | Payer: Medicare Other | Source: Ambulatory Visit | Attending: Radiation Oncology | Admitting: Radiation Oncology

## 2011-10-29 ENCOUNTER — Ambulatory Visit
Admission: RE | Admit: 2011-10-29 | Discharge: 2011-10-29 | Disposition: A | Discharge status: Home / Self Care | Payer: Medicare Other | Source: Ambulatory Visit | Attending: Radiation Oncology | Admitting: Radiation Oncology

## 2011-11-01 ENCOUNTER — Ambulatory Visit
Admission: RE | Admit: 2011-11-01 | Discharge: 2011-11-01 | Disposition: A | Discharge status: Home / Self Care | Payer: Medicare Other | Source: Ambulatory Visit | Attending: Radiation Oncology | Admitting: Radiation Oncology

## 2011-11-01 ENCOUNTER — Encounter: Payer: Self-pay | Admitting: Radiation Oncology

## 2011-11-01 DIAGNOSIS — C50919 Malignant neoplasm of unspecified site of unspecified female breast: Secondary | ICD-10-CM

## 2011-11-01 NOTE — Progress Notes (Signed)
Received patient in the clinic today unaccompanied for PUT with Dr. Dayton Scrape. Patient is alert and oriented to person, place, and time. No distress noted. Patient reports constant aching low back pain 7 on a scale of 0-10 for which norco takes the edge off. Patient denies nausea, vomiting, headache or diarrhea. Patient has no other complaints. Reported all findings to Dr. Dayton Scrape

## 2011-11-01 NOTE — Progress Notes (Signed)
Weekly Management Note:  Site:LS spine Current Dose:  1000  cGy Projected Dose: 3500  cGy  Narrative: The patient is seen today for routine under treatment assessment. CBCT/MVCT images/port films were reviewed. The chart was reviewed.   She states that her pain is improved.  Physical Examination: There were no vitals filed for this visit..  Weight:  . No change.  Impression: Tolerating radiation therapy well.  Plan: Continue radiation therapy as planned.

## 2011-11-02 ENCOUNTER — Ambulatory Visit
Admission: RE | Admit: 2011-11-02 | Discharge: 2011-11-02 | Disposition: A | Discharge status: Home / Self Care | Payer: Medicare Other | Source: Ambulatory Visit | Attending: Radiation Oncology | Admitting: Radiation Oncology

## 2011-11-03 ENCOUNTER — Ambulatory Visit
Admission: RE | Admit: 2011-11-03 | Discharge: 2011-11-03 | Disposition: A | Discharge status: Home / Self Care | Payer: Medicare Other | Source: Ambulatory Visit | Attending: Radiation Oncology | Admitting: Radiation Oncology

## 2011-11-03 ENCOUNTER — Telehealth: Payer: Self-pay | Admitting: *Deleted

## 2011-11-03 ENCOUNTER — Other Ambulatory Visit (HOSPITAL_BASED_OUTPATIENT_CLINIC_OR_DEPARTMENT_OTHER): Admitting: Lab

## 2011-11-03 ENCOUNTER — Ambulatory Visit (HOSPITAL_BASED_OUTPATIENT_CLINIC_OR_DEPARTMENT_OTHER): Payer: Medicaid Other | Admitting: Oncology

## 2011-11-03 VITALS — BP 148/90 | HR 109 | Temp 98.7°F | Ht 69.0 in | Wt 158.7 lb

## 2011-11-03 DIAGNOSIS — C50919 Malignant neoplasm of unspecified site of unspecified female breast: Secondary | ICD-10-CM

## 2011-11-03 DIAGNOSIS — C7951 Secondary malignant neoplasm of bone: Secondary | ICD-10-CM

## 2011-11-03 DIAGNOSIS — Z5111 Encounter for antineoplastic chemotherapy: Secondary | ICD-10-CM

## 2011-11-03 LAB — COMPREHENSIVE METABOLIC PANEL
BUN: 18 mg/dL (ref 6–23)
CO2: 33 mEq/L — ABNORMAL HIGH (ref 19–32)
Creatinine, Ser: 0.94 mg/dL (ref 0.50–1.10)
Glucose, Bld: 138 mg/dL — ABNORMAL HIGH (ref 70–99)
Total Bilirubin: 0.3 mg/dL (ref 0.3–1.2)

## 2011-11-03 LAB — CBC WITH DIFFERENTIAL/PLATELET
BASO%: 0.1 % (ref 0.0–2.0)
Basophils Absolute: 0 10*3/uL (ref 0.0–0.1)
Eosinophils Absolute: 0 10*3/uL (ref 0.0–0.5)
HCT: 35.9 % (ref 34.8–46.6)
LYMPH%: 3.2 % — ABNORMAL LOW (ref 14.0–49.7)
MCHC: 32.3 g/dL (ref 31.5–36.0)
MONO#: 0.4 10*3/uL (ref 0.1–0.9)
NEUT%: 90.9 % — ABNORMAL HIGH (ref 38.4–76.8)
Platelets: 224 10*3/uL (ref 145–400)
WBC: 7.8 10*3/uL (ref 3.9–10.3)

## 2011-11-03 MED ORDER — GOSERELIN ACETATE 10.8 MG ~~LOC~~ IMPL
10.8000 mg | DRUG_IMPLANT | Freq: Once | SUBCUTANEOUS | Status: AC
  Administered 2011-11-03: 10.8 mg via SUBCUTANEOUS
  Filled 2011-11-03: qty 10.8

## 2011-11-03 MED ORDER — OXYCODONE-ACETAMINOPHEN 5-325 MG PO TABS: 1.0000 | ORAL_TABLET | Freq: Four times a day (QID) | ORAL | Status: AC | PRN

## 2011-11-03 MED ORDER — FULVESTRANT 250 MG/5ML IM SOLN
500.0000 mg | INTRAMUSCULAR | Status: DC
  Administered 2011-11-03: 500 mg via INTRAMUSCULAR
  Filled 2011-11-03: qty 10

## 2011-11-03 MED ORDER — FULVESTRANT 250 MG/5ML IM SOLN
500.0000 mg | Freq: Once | INTRAMUSCULAR | Status: DC
  Filled 2011-11-03: qty 10

## 2011-11-03 MED ORDER — DEXAMETHASONE 4 MG PO TABS: 4.0000 mg | ORAL_TABLET | Freq: Two times a day (BID) | ORAL | Status: AC

## 2011-11-03 NOTE — Telephone Encounter (Signed)
gave patient appointment for 11-2011 thru 01-2012 printed out calendar and gave to the patient

## 2011-11-03 NOTE — Progress Notes (Signed)
gv pt appt schedule for march and April. Per val scheduled pt w/GM for 3/27 due to AB has no availability. ID: Kristin [name]   DOB: July 08, 1966  MR#: [id number]  WUJ#:[id number]  HISTORY OF PRESENT ILLNESS: Kristin [name] was originally seen by Dr. Darnelle Catalan in the hospital for an initial consult on September 17, 2009. At that time, she presented with back pain and was found to have a markedly abnormal left breast and axilla as well as lytic bone lesions. We obtained a biopsy of the left breast on February 8th which showed a high-grade tumor which was ER 100% receptor positive but Her-2 and progesterone receptor negative. The proliferation fraction was elevated at 33%. We then obtained a bone biopsy on February 11th and her T12 vertebral body indeed was found to harbor an estrogen receptor positive, progesterone receptor negative carcinoma consistent with her breast primary.  She had complete staging studies including a brain MRI with and without contrast which showed no evidence of metastatic disease to the brain, and a CT of the abdomen and pelvis which showed no liver lesions and no other lesions other than in the bones. There was a fibroid uterus incidentally noted. She had a CT of the chest previously (February 7th) which showed only some chronic appearing changes in the left upper lobe, consistent with her history of treated tuberculosis. Again, there were multiple lytic bone lesions.  Chaniah was treated in the neoadjuvant setting of docetaxel cyclophosphamide and doxorubicin x6, completed June 2011. She also received radiation therapy to T3 through T12.  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 Grade 2, negative sentinel lymph node. Patient was started on tamoxifen in July 2011, in addition to zoledronic acid which is been given monthly. Her subsequent treatment is as detailed  below.  INTERVAL HISTORY: Kristin [name] returns today with one of her sisters for followup of her breast cancer. Last week we set her up with radiation and started her on dexamethasone. She is tolerating the radiation well but she has again run out of medication both the dexamethasone and the oxycodone. She is alone at home, her son and being out of the house all day, and she is "bored, with nothing to do, and wanting to smoke."  REVIEW OF SYSTEMS: The pain is better but certainly not gone. It particularly affects her left hip. She does be she has trouble going up steps. Her sister tells me that [name]'s memory is poor and she has to remind her of everything. She wonders of an aid would be helpful. [name] is having more hot flashes. She feels anxious and depressed. She denies falls, nausea, vomiting, visual changes, or stiff neck. A detailed review of systems was otherwise stable.  PAST MEDICAL HISTORY: Past Medical History  Diagnosis Date  . Breast lump   . Thyroid disease   . Hypertension   . 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 Knee arthroscopy     left  . Breast surgery     left    FAMILY HISTORY Patient's mother is alive and in her mid 47s. She has a history of treated tuberculosis. The patient's father died from a drug overdose, and the patient does not know much about him. She has one brother with hypertension and one sister, Dois Davenport. There is no known history  of breast or ovarian cancer in the family.   GYNECOLOGIC HISTORY: Gx P1, first pregnancy to term at age 21. Her son was premature.  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 She is currently unemployed. She has been living with her mother, Kristin [name]. The patient's son is in high school. The patient is a Control and instrumentation engineer.    ADVANCED DIRECTIVES:  HEALTH MAINTENANCE: History  Substance Use Topics  . Smoking  status: Current Everyday Smoker  . Smokeless tobacco: Not on file  . Alcohol Use: No     Colonoscopy:  PAP:  Bone density:  Lipid panel:  No Known Allergies  Current Outpatient Prescriptions  Medication Sig Dispense Refill  . HYDROcodone-acetaminophen (NORCO) 5-325 MG per tablet Take 1 tablet by mouth every 8 (eight) hours as needed.  12 tablet  0  . levothyroxine (SYNTHROID, LEVOTHROID) 150 MCG tablet Take 1 tablet (150 mcg total) by mouth daily.  30 tablet  11  . LORazepam (ATIVAN) 0.5 MG tablet Take 1 tablet (0.5 mg total) by mouth 2 (two) times daily as needed for anxiety. For pain  30 tablet  0  . traMADol (ULTRAM) 50 MG tablet Take 1 tablet (50 mg total) by mouth every 6 (six) hours as needed. For pain  120 tablet  0  . venlafaxine (EFFEXOR-XR) 75 MG 24 hr capsule Take 75 mg by mouth daily.      Marland Kitchen dexamethasone (DECADRON) 4 MG tablet Take 1 tablet (4 mg total) by mouth 2 (two) times daily with a meal.  30 tablet  2  . ondansetron (ZOFRAN) 8 MG tablet Take 8 mg by mouth every 12 (twelve) hours as needed. For nausea      . oxyCODONE-acetaminophen (PERCOCET) 5-325 MG per tablet Take 1 tablet by mouth every 6 (six) hours as needed for pain.  30 tablet  0  . tamoxifen (NOLVADEX) 20 MG tablet Take 1 tablet (20 mg total) by mouth daily.  90 tablet  3   Current Facility-Administered Medications  Medication Dose Route Frequency Provider Last Rate Last Dose  . fulvestrant (FASLODEX) injection 500 mg  500 mg Intramuscular UD [name], MD   500 mg at 11/03/11 1226  . goserelin (ZOLADEX) injection 10.8 mg  10.8 mg Subcutaneous Once [name], MD   10.8 mg at 11/03/11 1227  . DISCONTD: fulvestrant (FASLODEX) injection 500 mg  500 mg Intramuscular Once [name], MD        OBJECTIVE: Middle-aged African American woman  Filed Vitals:   11/03/11 1048  BP: 148/90  Pulse: 109  Temp: 98.7 F (37.1 C)     Body mass index is 23.44 kg/(m^2).    ECOG FS: 2  Sclerae  unicteric Oropharynx clear No peripheral adenopathy Lungs no rales or rhonchi Heart regular rate and rhythm Abd benign MSK minimal spinal tenderness over the lumbar area; no peripheral edema Neuro: Knee jerks are present bilaterally, but somewhat decreased. There is no sensory level. Motor remains 5 over 5 knee extension and dorsiflexion bilaterally Breasts: Deferred  LAB RESULTS: Lab Results  Component Value Date   WBC 7.8 11/03/2011   NEUTROABS 7.0* 11/03/2011   HGB 11.6 11/03/2011   HCT 35.9 11/03/2011   MCV 87.8 11/03/2011   PLT 224 11/03/2011      Chemistry      Component Value Date/Time   NA 141 11/03/2011 1012   K 4.3 11/03/2011 1012   CL 104 11/03/2011 1012  CO2 33* 11/03/2011 1012   BUN 18 11/03/2011 1012   CREATININE 0.94 11/03/2011 1012      Component Value Date/Time   CALCIUM 8.7 11/03/2011 1012   ALKPHOS 82 11/03/2011 1012   AST 14 11/03/2011 1012   ALT 12 11/03/2011 1012   BILITOT 0.3 11/03/2011 1012       Lab Results  Component Value Date   LABCA2 179* 10/26/2011   CA 27-29 was 55 in October of 2012 and 179 most recently  No components found with this basename: [id number]    No results found for this basename: INR:1;PROTIME:1 in the last 168 hours  Urinalysis    Component Value Date/Time   COLORURINE YELLOW 10/06/2011 0558   APPEARANCEUR CLEAR 10/06/2011 0558   LABSPEC 1.023 10/06/2011 0558   LABSPEC 1.030 09/23/2010 1155   PHURINE 5.0 10/06/2011 0558   GLUCOSEU NEGATIVE 10/06/2011 0558   HGBUR NEGATIVE 10/06/2011 0558   BILIRUBINUR NEGATIVE 10/06/2011 0558   KETONESUR NEGATIVE 10/06/2011 0558   PROTEINUR NEGATIVE 10/06/2011 0558   UROBILINOGEN 0.2 10/06/2011 0558   NITRITE NEGATIVE 10/06/2011 0558   LEUKOCYTESUR SMALL* 10/06/2011 0558    STUDIES: Dg Lumbar Spine Complete  10/06/2011  *RADIOLOGY REPORT*  Clinical Data: Left low back pain, history breast cancer  LUMBAR SPINE - COMPLETE 4+ VIEW  Comparison: 05/21/2011  Findings: Five non-rib bearing lumbar  vertebrae. Osseous demineralization. Minimal facet degenerative changes lower lumbar spine. Disc space narrowing L4-L5. Deformity at anterior aspect inferior endplate T12 stable. Vertebral body heights otherwise maintained. No acute fracture, subluxation or bone destruction. No spondylolysis. Visualized portion of pelvis grossly intact.  IMPRESSION: Osseous demineralization. Mild degenerative disc and facet disease changes lower lumbar spine. No acute abnormalities.  Original Report Authenticated By: Lollie Marrow, M.D.   Dg Sacrum/coccyx  10/06/2011  *RADIOLOGY REPORT*  Clinical Data: Left low back pain, history breast cancer  SACRUM AND COCCYX - 2+ VIEW  Comparison: 05/21/2011  Findings: Osseous demineralization. Hip and SI joints symmetric and preserved. No definite fracture or bone destruction. Visualized portions of pelvis appear intact.  IMPRESSION: No acute abnormalities. Osseous demineralization. If patient has persistent sacrococcygeal pain, consider MR imaging.  Original Report Authenticated By: Lollie Marrow, M.D.   Ct Chest W Contrast  10/19/2011  *RADIOLOGY REPORT*  Clinical Data: Metastatic breast cancer.  Metastasis to bone. Chemotherapy and radiation therapy complete.  CT CHEST WITH CONTRAST  Technique:  Multidetector CT imaging of the chest was performed following the standard protocol during bolus administration of intravenous contrast.  Contrast: 80mL OMNIPAQUE IOHEXOL 300 MG/ML IJ SOLN  Comparison: 03/03/2011.  Lumbar spine MR 10/18/2011.  Findings: Lung windows demonstrate probable secretions along the left mainstem bronchus on image 23.  Mild centrilobular emphysema. Mild anteromedial right upper lobe nodularity on image 27, new. This measures 9 mm. A left lower lobe 5 mm nodule on image 40 which is new. Left apical fibrosis with patchy pulmonary opacity.  This extends into the paramediastinal left upper lobe.  Similar configuration and likely radiation induced.  Soft tissue windows  demonstrate no axillary adenopathy.  Surgical clips in the left breast.  Left supraclavicular nodes measure up to 7 mm on image 5 and are new since the prior. A right supraclavicular node measures 7 mm on image 7 and is also new.  Heart size upper normal, without pericardial or pleural effusion.  1.2 cm right paratracheal node is new on image 18. A right hilar lymph node measures 2.1 cm on image 26 and is new  or newly enlarged.  High left paratracheal node measures 1.5 cm on image 8 and is new.  Limited abdominal imaging demonstrates vague hypoattenuation within the right lobe of the liver on image 60.  This measures 9 mm and is new since the prior exam (image 60 of series 2 today).  Normal adrenal glands.  Extensive osseous metastasis. Similar pathologic fracture of the 11th posterolateral right rib.  Grossly similar extent of osseous metastasis.  Complicating compression deformities at T3, T5, and T6.  All similar.  No canal compromise.  A lytic lesion at the T10 level is new since the prior.  1.5 cm on image 42 transverse.  No definite epidural tumor.  IMPRESSION: 1.  Developing adenopathy within the chest and lower neck, highly suspicious for metastatic disease.  2.  Indeterminate bilateral lung nodules.  Especially within the inferior right upper lobe, metastatic disease cannot be excluded. 3.  Vague hypoattenuating area in the right lobe of the liver. Indeterminate and incompletely evaluated.  If definitive characterization is desired, pre and post contrast abdominal MRI should be considered. 4.  Osseous metastasis.  Slightly progressive since 03/03/2011.  A new posterior T10 lytic lesion is without definite epidural extension.  If there are neurologic symptoms to suggest canal involvement at this level, pre and postcontrast thoracic spine MRI should be considered  (T10 not imaged on 10/18/2011 lumbar spine MRI).  Original Report Authenticated By: Consuello Bossier, M.D.   Mr Lumbar Spine W Wo  Contrast  10/19/2011  *RADIOLOGY REPORT*  Clinical Data: Metastatic breast cancer. Back and left leg pain.  MRI LUMBAR SPINE WITHOUT AND WITH CONTRAST  Technique:  Multiplanar and multiecho pulse sequences of the lumbar spine were obtained without and with intravenous contrast.  Contrast: 14mL MULTIHANCE GADOBENATE DIMEGLUMINE 529 MG/ML IV SOLN  Comparison: Lumbar spine radiographs 10/06/2011 and lumbar spine MRI 11/24/2009.  Findings: Progressive osseous metastatic disease is demonstrated with numerous metastasis involving the lumbar vertebral bodies and posterior elements.  The most significant abnormality involves the L5 vertebral body, lamina and facet on the left.  There is tumor extruding into the spinal canal with mass effect on the left side of the thecal sac and likely impinging on the left L5 nerve root. There are also numerous small bone infarcts.  Metastatic disease also involves the sacrum.  There are large iliac bone infarcts along with metastasis.  No focal disc protrusions, spinal or foraminal stenosis at L1-2, L2- 3 or L3-4.  L4-5:  Diffuse bulging degenerated annulus and mild osteophytic ridging with mild flattening of the ventral thecal sac and mild foraminal stenosis bilaterally, right greater than left.  L5-S1:  Small focal central disc protrusion without direct neural compression.  No foraminal stenosis.  IMPRESSION:  1.  Progressive bone metastasis involving all of the lumbar vertebral bodies and the pelvis. 2.  Extensive involvement of the L5 vertebral body and posterior elements with epidural tumor on the left side impinging on the left L5 nerve root. 3.  Multiple small bone infarcts. 4.  No cord lesions.  Original Report Authenticated By: P. Loralie Champagne, M.D.   Nm Bone Scan Whole Body  10/19/2011  *RADIOLOGY REPORT*  Clinical Data: Breast cancer.  No bone metastasis.  NUCLEAR MEDICINE WHOLE BODY BONE SCINTIGRAPHY  Technique:  Whole body anterior and posterior images were obtained  approximately 3 hours after intravenous injection of radiopharmaceutical.  Radiopharmaceutical: 22.5 mCi technetium MDP  Comparison: MRI lumbar spine 10/18/2011, CT thorax 10/19/2011.  Findings: Uptake within the upper, mid  and lower thoracic spine corresponds to the metastasis and pathologic fractures on comparison CT.  Focal uptake within the posterior left iliac bone adjacent to the SI joint is consistent metastasis.  Focus of uptake within the left femoral head consistent metastasis.  A focus of uptake within the posterior right 11th rib consistent with metastasis.  IMPRESSION:  Multiple foci of metastasis within the spine, pelvis and left femoral head correlates to the disease described on comparison MRI and CT.  Original Report Authenticated By: Genevive Bi, M.D.    ASSESSMENT: A 46 year old [place] woman with history of breast cancer stage IV at diagnosis February 2011, with bony metastatic disease.   (1) status post 30 Gy to T-3 through T-18 October 2009  (2) status post docetaxel, cyclophosphamide and doxorubicin x6, completed in June 2011.  (3) status post Left lumpectomy and sentinel lymph node biopsy August 2011 for a 2-mm focus of residual invasive ductal carcinoma, grade 2, negative sentinel lymph node  (4) tamoxifen started July 2011, with zoledronic acid given monthly as well.  (5) status post radiation to Left breast completed November 2011  (6) now receiving radiation to the lower spine, scheduled through 11/15/2011   PLAN: Naila has stopped her tamoxifen and we ar ready to start a new treatment. I think fulvestrant has many advantages, including removing issues of compliance. She has a low estradiol level but the [facility] and LH are not elevated--accordingly we will be doing goserelin as well. All this was discussed with the patient and her sister and she received the first treatment today. She will receive a second dose of fulvestrant in 2 weeks, the a thirs 2 weeks later,  after which we will go to every 28 days. The goserelin will be repeated every 3 months. Laya will be restaged about 3 months from now with a repeat chest CT and bone scan.  Today i wrote her for dexamethasone 4 mg po BID and oxycodone/APAP to take BID PRN. I have asked our SW to contact the patient to maximize our support. Rhianna will see me again 04/08. She knows to call for any problems that may develop before then.   Afia Messenger C    11/03/2011

## 2011-11-04 ENCOUNTER — Ambulatory Visit
Admission: RE | Admit: 2011-11-04 | Discharge: 2011-11-04 | Disposition: A | Discharge status: Home / Self Care | Payer: Medicare Other | Source: Ambulatory Visit | Attending: Radiation Oncology | Admitting: Radiation Oncology

## 2011-11-05 ENCOUNTER — Ambulatory Visit
Admission: RE | Admit: 2011-11-05 | Discharge: 2011-11-05 | Disposition: A | Discharge status: Home / Self Care | Payer: Medicare Other | Source: Ambulatory Visit | Attending: Radiation Oncology | Admitting: Radiation Oncology

## 2011-11-08 ENCOUNTER — Ambulatory Visit
Admission: RE | Admit: 2011-11-08 | Discharge: 2011-11-08 | Disposition: A | Discharge status: Home / Self Care | Payer: Medicare Other | Source: Ambulatory Visit | Attending: Radiation Oncology | Admitting: Radiation Oncology

## 2011-11-08 ENCOUNTER — Encounter: Payer: Self-pay | Admitting: Radiation Oncology

## 2011-11-08 VITALS — BP 125/78 | HR 95 | Resp 18 | Wt 163.4 lb

## 2011-11-08 DIAGNOSIS — C50919 Malignant neoplasm of unspecified site of unspecified female breast: Secondary | ICD-10-CM

## 2011-11-08 NOTE — Progress Notes (Signed)
Patient presents to the clinic today accompanied by her sister and brother for an under treat visit with Dr. Basilio Cairo. Patient is alert and oriented to person, place, and time. No distress noted. Steady gait noted. Pleasant affect noted. Patient report aching left hip pain 5 on a scale of 0-10. Patient reports increased left hip pain when she sits for 1-2 hours at a time. Patient denies nausea, vomiting, headache, dizziness or diarrhea. Reported all findings to Dr. Basilio Cairo.

## 2011-11-08 NOTE — Progress Notes (Signed)
   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  CBCT/MVCT images/Port film x-rays were reviewed.  The chart was checked. Her pain is improving. It is most notable if she sits for more than one to 2 hours at a time. She denies any vomiting headache dizziness diarrhea or nausea  Physical Findings: Weight: 163 lb 6.4 oz (74.118 kg). SHe is well-appearing and in no acute distress.  Impression:  The patient is tolerating radiotherapy.  Plan:  Continue radiotherapy as planned.

## 2011-11-09 ENCOUNTER — Ambulatory Visit
Admission: RE | Admit: 2011-11-09 | Discharge: 2011-11-09 | Disposition: A | Discharge status: Home / Self Care | Payer: Medicare Other | Source: Ambulatory Visit | Attending: Radiation Oncology | Admitting: Radiation Oncology

## 2011-11-09 ENCOUNTER — Other Ambulatory Visit: Payer: Self-pay | Admitting: Medical Oncology

## 2011-11-09 DIAGNOSIS — C50919 Malignant neoplasm of unspecified site of unspecified female breast: Secondary | ICD-10-CM

## 2011-11-09 MED ORDER — LORAZEPAM 0.5 MG PO TABS: 0.5000 mg | ORAL_TABLET | Freq: Two times a day (BID) | ORAL | Status: DC | PRN

## 2011-11-09 MED ORDER — HYDROCODONE-ACETAMINOPHEN 5-325 MG PO TABS: 1.0000 | ORAL_TABLET | Freq: Three times a day (TID) | ORAL | Status: DC | PRN

## 2011-11-10 ENCOUNTER — Encounter: Payer: Self-pay | Admitting: Oncology

## 2011-11-10 ENCOUNTER — Ambulatory Visit
Admission: RE | Admit: 2011-11-10 | Discharge: 2011-11-10 | Disposition: A | Discharge status: Home / Self Care | Payer: Medicare Other | Source: Ambulatory Visit | Attending: Radiation Oncology | Admitting: Radiation Oncology

## 2011-11-10 NOTE — Progress Notes (Signed)
Spoke with patient case worker today, will fax over bills (lab) to meet deductible. Caseworker Ms. Sherlie Ban stated that patient needed to contact her as well. Will call and advise patient of that. Caseworker number [phone]

## 2011-11-11 ENCOUNTER — Ambulatory Visit
Admission: RE | Admit: 2011-11-11 | Discharge: 2011-11-11 | Disposition: A | Discharge status: Home / Self Care | Payer: Medicare Other | Source: Ambulatory Visit | Attending: Radiation Oncology | Admitting: Radiation Oncology

## 2011-11-12 ENCOUNTER — Ambulatory Visit
Admission: RE | Admit: 2011-11-12 | Discharge: 2011-11-12 | Disposition: A | Discharge status: Home / Self Care | Payer: Medicare Other | Source: Ambulatory Visit | Attending: Radiation Oncology | Admitting: Radiation Oncology

## 2011-11-15 ENCOUNTER — Ambulatory Visit (HOSPITAL_BASED_OUTPATIENT_CLINIC_OR_DEPARTMENT_OTHER): Payer: Medicaid Other | Admitting: Oncology

## 2011-11-15 ENCOUNTER — Encounter: Payer: Self-pay | Admitting: Radiation Oncology

## 2011-11-15 ENCOUNTER — Ambulatory Visit
Admission: RE | Admit: 2011-11-15 | Discharge: 2011-11-15 | Disposition: A | Discharge status: Home / Self Care | Payer: Medicare Other | Source: Ambulatory Visit | Attending: Radiation Oncology | Admitting: Radiation Oncology

## 2011-11-15 ENCOUNTER — Other Ambulatory Visit (HOSPITAL_BASED_OUTPATIENT_CLINIC_OR_DEPARTMENT_OTHER): Admitting: Lab

## 2011-11-15 VITALS — Wt 161.2 lb

## 2011-11-15 VITALS — BP 136/89 | HR 84 | Temp 98.6°F | Ht 69.0 in | Wt 160.9 lb

## 2011-11-15 DIAGNOSIS — C800 Disseminated malignant neoplasm, unspecified: Secondary | ICD-10-CM

## 2011-11-15 DIAGNOSIS — C50919 Malignant neoplasm of unspecified site of unspecified female breast: Secondary | ICD-10-CM

## 2011-11-15 LAB — COMPREHENSIVE METABOLIC PANEL
ALT: 22 U/L (ref 0–35)
CO2: 30 mEq/L (ref 19–32)
Calcium: 8.7 mg/dL (ref 8.4–10.5)
Chloride: 104 mEq/L (ref 96–112)
Glucose, Bld: 86 mg/dL (ref 70–99)
Sodium: 141 mEq/L (ref 135–145)
Total Bilirubin: 0.2 mg/dL — ABNORMAL LOW (ref 0.3–1.2)
Total Protein: 6.7 g/dL (ref 6.0–8.3)

## 2011-11-15 LAB — CBC WITH DIFFERENTIAL/PLATELET
Basophils Absolute: 0 10*3/uL (ref 0.0–0.1)
Eosinophils Absolute: 0 10*3/uL (ref 0.0–0.5)
HGB: 11.2 g/dL — ABNORMAL LOW (ref 11.6–15.9)
LYMPH%: 3.6 % — ABNORMAL LOW (ref 14.0–49.7)
MCV: 87.2 fL (ref 79.5–101.0)
MONO#: 0.2 10*3/uL (ref 0.1–0.9)
MONO%: 6.4 % (ref 0.0–14.0)
NEUT#: 3.2 10*3/uL (ref 1.5–6.5)
Platelets: 194 10*3/uL (ref 145–400)
RDW: 16.9 % — ABNORMAL HIGH (ref 11.2–14.5)
WBC: 3.6 10*3/uL — ABNORMAL LOW (ref 3.9–10.3)
nRBC: 0 % (ref 0–0)

## 2011-11-15 NOTE — Progress Notes (Signed)
Weekly Management Note:  Site:L-S spine Current Dose:  3500  cGy Projected Dose: 3500  cGy  Narrative: The patient is seen today for routine under treatment assessment. CBCT/MVCT images/port films were reviewed. The chart was reviewed.   Her pain is markedly improved. She does take hydrocodone on occasion.  Physical Examination: There were no vitals filed for this visit..  Weight: 161 lb 3.2 oz (73.12 kg). No palpable spinal discomfort.  Impression: Satisfactory palliation.  Plan: Followup visit with me in one month.

## 2011-11-15 NOTE — Progress Notes (Signed)
Pt alert and oriented x 3, pt jumping in hall, steady gait, not taking tamoxifen at present, no c/o pain, dizziness, or nusea, eating and dinking well stated patient,  4:14 PM

## 2011-11-15 NOTE — Progress Notes (Signed)
[facility] Radiation Oncology End of Treatment Note  Name:Kristin [name]  Date: 11/15/2011 EXB:[id number] DOB:01/04/1966   Status:outpatient    CC: [name], MD, MD    REFERRING PHYSICIAN: Dr. Marikay Alar Magrinat      DIAGNOSIS: Metastatic carcinoma the breast to bone   INDICATION FOR TREATMENT: Palliative   TREATMENT DATES: 10/27/2011 through 11/15/2011                          SITE/DOSE:   Lumbar/sacral spine                         BEAMS/ENERGY:  18 MV photons parallel opposed anterior-posterior fields                 NARRATIVE:  Ms. Axley tolerated treatment well and had almost complete pain relief by completion of therapy. She still takes an occasional hydrocodone tablet when necessary.                          PLAN: Routine followup in one month. Patient instructed to call if questions or worsening complaints in interim.

## 2011-11-15 NOTE — Progress Notes (Signed)
gv pt appt schedule for march and April. Per Kristin [name] scheduled pt w/GM for 3/27 due to AB has no availability. ID: Kristin [name]   DOB: May 07, 1966  MR#: [id number]  WUJ#:[id number]  HISTORY OF PRESENT ILLNESS: Kristin [name] was originally seen by Dr. Darnelle Catalan in the hospital for an initial consult on September 17, 2009. At that time, she presented with back pain and was found to have a markedly abnormal left breast and axilla as well as lytic bone lesions. We obtained a biopsy of the left breast on February 8th which showed a high-grade tumor which was ER 100% receptor positive but Her-2 and progesterone receptor negative. The proliferation fraction was elevated at 33%. We then obtained a bone biopsy on February 11th and her T12 vertebral body indeed was found to harbor an estrogen receptor positive, progesterone receptor negative carcinoma consistent with her breast primary.  She had complete staging studies including a brain MRI with and without contrast which showed no evidence of metastatic disease to the brain, and a CT of the abdomen and pelvis which showed no liver lesions and no other lesions other than in the bones. There was a fibroid uterus incidentally noted. She had a CT of the chest previously (February 7th) which showed only some chronic appearing changes in the left upper lobe, consistent with her history of treated tuberculosis. Again, there were multiple lytic bone lesions.  Kristin [name] was treated in the neoadjuvant setting of docetaxel cyclophosphamide and doxorubicin x6, completed June 2011. She also received radiation therapy to T3 through T12.  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 Grade 2, negative sentinel lymph node. Patient was started on tamoxifen in July 2011, in addition to zoledronic acid which is been given monthly. Her subsequent treatment is as detailed  below.  INTERVAL HISTORY: Kristin [name] returns today with her sister for followup of her breast cancer. She completed her radiation treatments today. She says she feels "great", and she tolerated the radiation with no side effects.  REVIEW OF SYSTEMS: Kristin [name] a frequently seen hypomanic, so when she reports no symptoms one has to get a little; but in fact she looks terrific and looks better than the last time I saw her. She tells me she is walking okay, with no leg weakness, and she's had no problems with bowel movements or urine control. She denies any peripheral neuropathy. She still and dexamethasone, one tablet twice daily, and this is causing her a little bit of insomnia and a little bit of blurred vision. She has had no thrush problems. She tells me she is less depressed than before. She continues to have back pain. She says her hydrocodone has not come from the cancer center in [place] which is where she usually gets it from. I suggested she call them to see what happened there. In the meantime she does have tramadol which seems to be helping. She has had no unusual headaches no nausea or vomiting, no no cough, phlegm production, or pleurisy. Detailed review of systems was otherwise stable  PAST MEDICAL HISTORY: Past Medical History  Diagnosis Date  . Breast lump   . Thyroid disease   . Hypertension   . 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 Knee arthroscopy     left  . Breast surgery  left    FAMILY HISTORY Patient's mother is alive and in her mid 59s. She has a history of treated tuberculosis. The patient's father died from a drug overdose, and the patient does not know much about him. She has one brother with hypertension and one sister, Kristin [name]. There is no known history of breast or ovarian cancer in the family.   GYNECOLOGIC HISTORY: Gx P1, first pregnancy to term at age 52. Her son was premature.  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 She is currently unemployed. She has been living with her mother, Kristin [name]. The patient's son is in high school. The patient is a Control and instrumentation engineer.    ADVANCED DIRECTIVES:  HEALTH MAINTENANCE: History  Substance Use Topics  . Smoking status: Current Everyday Smoker  . Smokeless tobacco: Not on file  . Alcohol Use: No     Colonoscopy:  PAP:  Bone density:  Lipid panel:  No Known Allergies  Current Outpatient Prescriptions  Medication Sig Dispense Refill  . dexamethasone (DECADRON) 4 MG tablet Take 6 mg by mouth 2 (two) times daily with a meal.      . HYDROcodone-acetaminophen (NORCO) 5-325 MG per tablet Take 1 tablet by mouth every 8 (eight) hours as needed.  90 tablet  0  . levothyroxine (SYNTHROID, LEVOTHROID) 150 MCG tablet Take 1 tablet (150 mcg total) by mouth daily.  30 tablet  11  . LORazepam (ATIVAN) 0.5 MG tablet Take 1 tablet (0.5 mg total) by mouth 2 (two) times daily as needed for anxiety. For pain  60 tablet  0  . ondansetron (ZOFRAN) 8 MG tablet Take 8 mg by mouth every 12 (twelve) hours as needed. For nausea      . tamoxifen (NOLVADEX) 20 MG tablet Take 1 tablet (20 mg total) by mouth daily.  90 tablet  3  . traMADol (ULTRAM) 50 MG tablet Take 1 tablet (50 mg total) by mouth every 6 (six) hours as needed. For pain  120 tablet  0  . venlafaxine (EFFEXOR-XR) 75 MG 24 hr capsule Take 75 mg by mouth daily.        OBJECTIVE: Middle-aged [place] American woman who appears fit Filed Vitals:   11/15/11 1639  BP: 136/89  Pulse: 84  Temp: 98.6 F (37 C)     Body mass index is 23.76 kg/(m^2).    ECOG FS: 2  Sclerae unicteric Oropharynx clear No peripheral adenopathy Lungs no rales or rhonchi Heart regular rate and rhythm Abd benign MSK stable spinal tenderness over the lumbar area; no peripheral edema Neuro: Knee jerks are decreased. There is no sensory level. Motor is 5 over 5  knee extension and dorsiflexion bilaterally Breasts: Deferred  LAB RESULTS: Lab Results  Component Value Date   WBC 3.6* 11/15/2011   NEUTROABS 3.2 11/15/2011   HGB 11.2* 11/15/2011   HCT 34.3* 11/15/2011   MCV 87.2 11/15/2011   PLT 194 11/15/2011      Chemistry      Component Value Date/Time   NA 141 11/03/2011 1012   K 4.3 11/03/2011 1012   CL 104 11/03/2011 1012   CO2 33* 11/03/2011 1012   BUN 18 11/03/2011 1012   CREATININE 0.94 11/03/2011 1012      Component Value Date/Time   CALCIUM 8.7 11/03/2011 1012   ALKPHOS 82 11/03/2011 1012   AST 14 11/03/2011 1012   ALT 12 11/03/2011 1012   BILITOT 0.3 11/03/2011 1012  Lab Results  Component Value Date   LABCA2 179* 10/26/2011   CA 27-29 was 55 in October of 2012 and 179 most recently  No components found with this basename: [id number]    No results found for this basename: INR:1;PROTIME:1 in the last 168 hours  Urinalysis    Component Value Date/Time   COLORURINE YELLOW 10/06/2011 0558   APPEARANCEUR CLEAR 10/06/2011 0558   LABSPEC 1.023 10/06/2011 0558   LABSPEC 1.030 09/23/2010 1155   PHURINE 5.0 10/06/2011 0558   GLUCOSEU NEGATIVE 10/06/2011 0558   HGBUR NEGATIVE 10/06/2011 0558   BILIRUBINUR NEGATIVE 10/06/2011 0558   KETONESUR NEGATIVE 10/06/2011 0558   PROTEINUR NEGATIVE 10/06/2011 0558   UROBILINOGEN 0.2 10/06/2011 0558   NITRITE NEGATIVE 10/06/2011 0558   LEUKOCYTESUR SMALL* 10/06/2011 0558    STUDIES: No new studies found  ASSESSMENT: A 46 year old [place] woman with history of breast cancer stage IV at diagnosis February 2011, with metastases to bone and possibly developing spread to lymph nodes, lungs and liver   (1) status post 30 Gy to T-3 through T-18 October 2009  (2) status post docetaxel, cyclophosphamide and doxorubicin x6, completed in June 2011.  (3) status post Left lumpectomy and sentinel lymph node biopsy August 2011 for a 2-mm focus of residual invasive ductal carcinoma, grade 2, negative sentinel lymph  node  (4) tamoxifen started July 2011, with evidence of progression March 2013  (5) status post radiation to Left breast completed November 2011  (6) now receiving radiation to the lower spine, scheduled through 11/15/2011  (7) fulvestrant/goserelin started 11/03/2011  (8) continuing on zolendronic acid monthly   PLAN: Clinically Nylia seems to have had a good response to the radiation and the dexamethasone. Today I wrote her for a dexamethasone taper: She will take 4 mg in the morning only beginning today; a week from today, April 15, she will take a half a tablet in the morning only; beginning on April 22 she will start taking a half a tablet every other day and she will continue that uncle may 6 when she will stop. This information was given to her in writing and her sister has the copy.  Najwa says that she did not get her hydrocodone from [place]. She is going to call him and see if there was an interruption in service, or if it was the case that somebody took her medications somehow. At the next visit, which will be of with her April 24 fulvestrant dose, we will consider going off of narcotics and leaning on tramadol, nonsteroidals, and gabapentin. If that is not sufficient we will consider methadone.  The plan is to continue the goserelin every 3 months and fulvestrant and zoledronic acid monthly, with reassessment by CT of the chest 3-4 months after the start of the fulvestrant.     Nyan Dufresne C    11/15/2011

## 2011-11-16 ENCOUNTER — Other Ambulatory Visit: Payer: Self-pay | Admitting: *Deleted

## 2011-11-16 ENCOUNTER — Telehealth: Payer: Self-pay | Admitting: *Deleted

## 2011-11-16 NOTE — Telephone Encounter (Signed)
per orders from 11-16-2011 changed the injection appointment to 12-01-2011 to zometa appointment

## 2011-11-17 ENCOUNTER — Ambulatory Visit: Payer: Self-pay

## 2011-11-17 NOTE — Progress Notes (Signed)
1200-Pt called [facility] to see what time her appt was for today's injection.  Pt did not show up for her scheduled appt at 10:30AM this morning.  Pt states that she will be able to be to [facility] for injection by 1400.  1418-Pt has not arrived for injection at this time.

## 2011-11-18 ENCOUNTER — Ambulatory Visit (HOSPITAL_BASED_OUTPATIENT_CLINIC_OR_DEPARTMENT_OTHER): Payer: Medicaid Other | Admitting: Oncology

## 2011-11-18 ENCOUNTER — Telehealth: Payer: Self-pay | Admitting: *Deleted

## 2011-11-18 DIAGNOSIS — C50919 Malignant neoplasm of unspecified site of unspecified female breast: Secondary | ICD-10-CM

## 2011-11-18 DIAGNOSIS — Z5111 Encounter for antineoplastic chemotherapy: Secondary | ICD-10-CM

## 2011-11-18 DIAGNOSIS — C800 Disseminated malignant neoplasm, unspecified: Secondary | ICD-10-CM

## 2011-11-18 MED ORDER — FULVESTRANT 250 MG/5ML IM SOLN
500.0000 mg | INTRAMUSCULAR | Status: DC
  Administered 2011-11-18: 500 mg via INTRAMUSCULAR
  Filled 2011-11-18: qty 10

## 2011-11-18 MED ORDER — ALTEPLASE 2 MG IJ SOLR
2.0000 mg | Freq: Once | INTRAMUSCULAR | Status: AC | PRN
  Filled 2011-11-18: qty 2

## 2011-11-18 MED ORDER — SODIUM CHLORIDE 0.9 % IJ SOLN
3.0000 mL | Freq: Once | INTRAMUSCULAR | Status: AC | PRN
  Filled 2011-11-18: qty 10

## 2011-11-18 MED ORDER — FULVESTRANT 250 MG/5ML IM SOLN: 500.0000 mg | Freq: Once | INTRAMUSCULAR | Status: DC

## 2011-11-18 NOTE — Telephone Encounter (Signed)
patient was placed on zometa for 11-19-2011 at 4:00pm

## 2011-11-19 ENCOUNTER — Ambulatory Visit (HOSPITAL_BASED_OUTPATIENT_CLINIC_OR_DEPARTMENT_OTHER): Payer: Medicaid Other

## 2011-11-19 VITALS — BP 143/93 | HR 97 | Temp 98.8°F

## 2011-11-19 DIAGNOSIS — C50919 Malignant neoplasm of unspecified site of unspecified female breast: Secondary | ICD-10-CM

## 2011-11-19 DIAGNOSIS — C7951 Secondary malignant neoplasm of bone: Secondary | ICD-10-CM

## 2011-11-19 MED ORDER — ZOLEDRONIC ACID 4 MG/100ML IV SOLN
4.0000 mg | Freq: Once | INTRAVENOUS | Status: AC
  Administered 2011-11-19: 4 mg via INTRAVENOUS
  Filled 2011-11-19: qty 100

## 2011-11-19 MED ORDER — LORAZEPAM 1 MG PO TABS
0.5000 mg | ORAL_TABLET | Freq: Once | ORAL | Status: AC
  Administered 2011-11-19: 0.5 mg via ORAL

## 2011-11-19 MED ORDER — SODIUM CHLORIDE 0.9 % IV SOLN
Freq: Once | INTRAVENOUS | Status: AC
  Administered 2011-11-19: 17:00:00 via INTRAVENOUS

## 2011-11-19 MED ORDER — ZOLEDRONIC ACID 4 MG/100ML IV SOLN
4.0000 mg | Freq: Once | INTRAVENOUS | Status: DC
  Filled 2011-11-19: qty 100

## 2011-11-19 NOTE — Patient Instructions (Signed)
Patient aware of next appointment; discharged home with no complaints. 

## 2011-11-24 NOTE — Progress Notes (Signed)
No show

## 2011-11-30 ENCOUNTER — Other Ambulatory Visit: Payer: Self-pay | Admitting: Oncology

## 2011-12-01 ENCOUNTER — Other Ambulatory Visit: Payer: Self-pay | Admitting: *Deleted

## 2011-12-01 ENCOUNTER — Ambulatory Visit: Payer: Self-pay

## 2011-12-01 ENCOUNTER — Ambulatory Visit (HOSPITAL_BASED_OUTPATIENT_CLINIC_OR_DEPARTMENT_OTHER): Payer: Medicare Other | Admitting: Physician Assistant

## 2011-12-01 ENCOUNTER — Other Ambulatory Visit (HOSPITAL_BASED_OUTPATIENT_CLINIC_OR_DEPARTMENT_OTHER): Payer: Medicare Other | Admitting: Lab

## 2011-12-01 ENCOUNTER — Ambulatory Visit: Payer: Medicare Other | Admitting: Lab

## 2011-12-01 ENCOUNTER — Telehealth: Payer: Self-pay | Admitting: Oncology

## 2011-12-01 ENCOUNTER — Encounter: Payer: Self-pay | Admitting: Physician Assistant

## 2011-12-01 VITALS — BP 138/90 | HR 85 | Temp 98.4°F | Ht 69.0 in | Wt 160.6 lb

## 2011-12-01 DIAGNOSIS — C800 Disseminated malignant neoplasm, unspecified: Secondary | ICD-10-CM

## 2011-12-01 DIAGNOSIS — F329 Major depressive disorder, single episode, unspecified: Secondary | ICD-10-CM

## 2011-12-01 DIAGNOSIS — C50919 Malignant neoplasm of unspecified site of unspecified female breast: Secondary | ICD-10-CM

## 2011-12-01 LAB — CBC WITH DIFFERENTIAL/PLATELET
Eosinophils Absolute: 0 10*3/uL (ref 0.0–0.5)
MONO#: 0.2 10*3/uL (ref 0.1–0.9)
NEUT#: 5.2 10*3/uL (ref 1.5–6.5)
RBC: 4.12 10*6/uL (ref 3.70–5.45)
RDW: 18.6 % — ABNORMAL HIGH (ref 11.2–14.5)
WBC: 5.9 10*3/uL (ref 3.9–10.3)
lymph#: 0.4 10*3/uL — ABNORMAL LOW (ref 0.9–3.3)
nRBC: 0 % (ref 0–0)

## 2011-12-01 MED ORDER — TRAMADOL HCL 50 MG PO TABS: 50.0000 mg | ORAL_TABLET | Freq: Four times a day (QID) | ORAL | Status: DC | PRN

## 2011-12-01 MED ORDER — VENLAFAXINE HCL ER 75 MG PO CP24: 75.0000 mg | ORAL_CAPSULE | Freq: Every day | ORAL | Status: DC

## 2011-12-01 MED ORDER — FULVESTRANT 250 MG/5ML IM SOLN
500.0000 mg | Freq: Once | INTRAMUSCULAR | Status: AC
  Administered 2011-12-01: 500 mg via INTRAMUSCULAR
  Filled 2011-12-01: qty 10

## 2011-12-01 MED ORDER — GABAPENTIN 100 MG PO CAPS: ORAL_CAPSULE | ORAL | Status: DC

## 2011-12-01 MED ORDER — DEXAMETHASONE 4 MG PO TABS: ORAL_TABLET | ORAL | Status: DC

## 2011-12-01 MED ORDER — ONDANSETRON HCL 8 MG PO TABS: 8.0000 mg | ORAL_TABLET | Freq: Two times a day (BID) | ORAL | Status: DC | PRN

## 2011-12-01 NOTE — Progress Notes (Signed)
gv pt appt schedule for march and April. Per val scheduled pt w/GM for 3/27 due to AB has no availability. ID: Kristin [name]   DOB: February 02, 1966  MR#: [id number]  WUJ#:[id number]  HISTORY OF PRESENT ILLNESS: Kristin [name] was originally seen in the hospital for an initial consult on September 17, 2009. At that time, she presented with back pain and was found to have a markedly abnormal left breast and axilla as well as lytic bone lesions. We obtained a biopsy of the left breast on February 8th which showed a high-grade tumor which was ER 100% receptor positive but Her-2 and progesterone receptor negative. The proliferation fraction was elevated at 33%. We then obtained a bone biopsy on February 11th and her T12 vertebral body indeed was found to harbor an estrogen receptor positive, progesterone receptor negative carcinoma consistent with her breast primary.  She had complete staging studies including a brain MRI with and without contrast which showed no evidence of metastatic disease to the brain, and a CT of the abdomen and pelvis which showed no liver lesions and no other lesions other than in the bones. There was a fibroid uterus incidentally noted. She had a CT of the chest previously (February 7th) which showed only some chronic appearing changes in the left upper lobe, consistent with her history of treated tuberculosis. Again, there were multiple lytic bone lesions.  Kristin [name] was treated in the neoadjuvant setting of docetaxel cyclophosphamide and doxorubicin x6, completed June 2011. She also received radiation therapy to T3 through T12.  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 Grade 2, negative sentinel lymph node. Patient was started on tamoxifen in July 2011, in addition to zoledronic acid which is been given monthly. Her subsequent treatment is as detailed below.  INTERVAL  HISTORY: Kristin [name] returns today for followup of her metastatic breast carcinoma. She began Faslodex 4 weeks ago and is due for her third loading dose today. She's also receiving Zoladex every 3 months, last given 11/03/2011. She receives zoledronic acid monthly, last given 2 weeks ago on 11/19/2011.   Kristin [name] was supposed to be tapering off of dexamethasone, but somehow got a little confused and has been taking 6 mg (one and a half tablets) daily.  Although she is feeling very well, she had multiple questions today. Over half of our 45 minute appointment today was spent again reviewing her diagnosis, reviewing her treatment plan, and coordinating care.  REVIEW OF SYSTEMS: Kristin [name] continues to complain of back pain, although this is currently well-controlled. She continues to have problems with depression and anxiety, and needs a refill on her Effexor. Overall, she tells me she is feeling better, and she denies suicidal ideations. She has some difficulty sleeping at times.  She has occasional nausea, treated effectively with ondansetron.  Otherwise, Kristin [name] has had no fevers or chills. No rashes or abnormal bleeding. No vaginal bleeding. No change in bowel habits. No cough or increased shortness of breath. No abnormal headaches or dizziness. She continues to have some slightly blurred vision but no change in vision. No peripheral swelling.  A detailed review of systems is otherwise noncontributory.  PAST MEDICAL HISTORY: Past Medical History  Diagnosis Date  . Breast lump   . Thyroid disease   . Hypertension   . 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  Knee arthroscopy     left  . Breast surgery     left    FAMILY HISTORY Patient's mother is alive and in her mid 27s. She has a history of treated tuberculosis. The patient's father died from a drug overdose, and the patient does not know much about him. She has one brother with  hypertension and one sister, Dois Davenport. There is no known history of breast or ovarian cancer in the family.   GYNECOLOGIC HISTORY: Gx P1, first pregnancy to term at age 55. Her son was premature.  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 She is currently unemployed. She has been living with her mother, Kristin [name]. The patient's son is in high school. The patient is a Control and instrumentation engineer.    ADVANCED DIRECTIVES:  HEALTH MAINTENANCE: History  Substance Use Topics  . Smoking status: Current Everyday Smoker  . Smokeless tobacco: Not on file  . Alcohol Use: No     Colonoscopy:  PAP:  Bone density:  Lipid panel:  No Known Allergies  Current Outpatient Prescriptions  Medication Sig Dispense Refill  . fulvestrant (FASLODEX) 250 MG/5ML injection Inject 500 mg into the muscle every 30 (thirty) days. One injection each buttock over 1-2 minutes. Warm prior to use.      Marland Kitchen goserelin (ZOLADEX) 10.8 MG injection Inject 10.8 mg into the skin every 3 (three) months.      Marland Kitchen HYDROcodone-acetaminophen (NORCO) 5-325 MG per tablet Take 1 tablet by mouth every 8 (eight) hours as needed.  90 tablet  0  . levothyroxine (SYNTHROID, LEVOTHROID) 150 MCG tablet Take 1 tablet (150 mcg total) by mouth daily.  30 tablet  11  . LORazepam (ATIVAN) 0.5 MG tablet Take 1 tablet (0.5 mg total) by mouth 2 (two) times daily as needed for anxiety. For pain  60 tablet  0  . ondansetron (ZOFRAN) 8 MG tablet Take 8 mg by mouth every 12 (twelve) hours as needed. For nausea      . traMADol (ULTRAM) 50 MG tablet Take 1 tablet (50 mg total) by mouth every 6 (six) hours as needed. For pain  120 tablet  0  . venlafaxine (EFFEXOR-XR) 75 MG 24 hr capsule Take 75 mg by mouth daily.      . Zoledronic Acid (ZOMETA IV) Inject 4 mg into the vein every 30 (thirty) days.      Marland Kitchen dexamethasone (DECADRON) 4 MG tablet 1 po qam, then taper as directed  15 tablet  0   Current Facility-Administered  Medications  Medication Dose Route Frequency Provider Last Rate Last Dose  . fulvestrant (FASLODEX) injection 500 mg  500 mg Intramuscular Once [name], MD   500 mg at 12/01/11 1153    OBJECTIVE: Middle-aged [place] American woman who appears fit Filed Vitals:   12/01/11 1114  BP: 138/90  Pulse: 85  Temp: 98.4 F (36.9 C)     Body mass index is 23.72 kg/(m^2).    ECOG FS: 1  Physical Exam: HEENT:  Sclerae anicteric, conjunctivae pink.  Oropharynx clear.  No mucositis or candidiasis.   Nodes:  No cervical, supraclavicular, or axillary lymphadenopathy palpated.  Breast Exam:  Deferred  Lungs:  Clear to auscultation bilaterally.  No crackles, rhonchi, or wheezes.   Heart:  Regular rate and rhythm.   Abdomen:  Soft, nontender.  Positive bowel sounds.  No organomegaly or masses palpated.   Musculoskeletal: Mild diffuse spinal tenderness to palpation.  Extremities:  Benign.  No peripheral edema or cyanosis.   Skin:  Benign.   Neuro:  Nonfocal. Alert and oriented x3 with pleasant affect.    LAB RESULTS: Lab Results  Component Value Date   WBC 5.9 12/01/2011   NEUTROABS 5.2 12/01/2011   HGB 11.5* 12/01/2011   HCT 35.0 12/01/2011   MCV 85.0 12/01/2011   PLT 209 12/01/2011      Chemistry      Component Value Date/Time   NA 141 11/15/2011 1628   K 3.5 11/15/2011 1628   CL 104 11/15/2011 1628   CO2 30 11/15/2011 1628   BUN 16 11/15/2011 1628   CREATININE 0.93 11/15/2011 1628      Component Value Date/Time   CALCIUM 8.7 11/15/2011 1628   ALKPHOS 77 11/15/2011 1628   AST 16 11/15/2011 1628   ALT 22 11/15/2011 1628   BILITOT 0.2* 11/15/2011 1628       Lab Results  Component Value Date   LABCA2 179* 10/26/2011   CA 27-29 was 55 in October of 2012 and 179 most recently   STUDIES: No new studies found  ASSESSMENT: A 46 year old [place] woman with history of breast cancer stage IV at diagnosis February 2011, with metastases to bone and possibly developing spread to lymph nodes, lungs  and liver   (1) status post 30 Gy to T-3 through T-18 October 2009  (2) status post docetaxel, cyclophosphamide and doxorubicin x6, completed in June 2011.  (3) status post Left lumpectomy and sentinel lymph node biopsy August 2011 for a 2-mm focus of residual invasive ductal carcinoma, grade 2, negative sentinel lymph node  (4) tamoxifen started July 2011, with evidence of progression March 2013  (5) status post radiation to Left breast completed November 2011  (6) now receiving radiation to the lower spine, scheduled through 11/15/2011  (7) fulvestrant/goserelin started 11/03/2011  (8) continuing on zolendronic acid monthly   PLAN:  Zaynab will receive her next injection of Faslodex today. From this point forward, she will receive the injections every 28 days. She would actually be due for her next monthly dose of zoledronic acid in 2 weeks, but this would require her to come in every 2 weeks for some type of treatment, either the zoledronic acid, or her injections. Accordingly, we will hold the zoledronic acid until she returns on May 22 and at that time she'll receive both the Faslodex and zoledronic acid.  Per Dr. Darnelle Catalan plan. We will continue this regimen for 3-4 months, and we'll repeat chest CT in mid July to assess response to therapy. He will see her soon thereafter, to review the results and discuss treatment plan.  Per Felissa's request, we are refilling her Effexor and her ondansetron. We are going to try to decrease her narcotics, and I am prescribing both tramadol (50 mg by mouth Q6 hours as needed for pain) and gabapentin (100 mg by mouth 3 times a day as needed for pain). These 4 prescriptions will be called in to her pharmacy in [facility].  If the tramadol and gabapentin combination is ineffective, Dr. Darnelle Catalan has suggested trying methadone.  I also gave her another prescription for dexamethasone, along with a calendar detailing the taper. Specifically, Murl will  take 4 mg each morning for the next week. She will then take 2 mg each morning for one week, then 2 mg every other morning for 2 weeks. She should be completely tapered off of her steroid by the time she comes back in to see me on May  22.   Suellen voices understanding and agreement with our plan, and will call with any problems.  Zidane Renner    12/01/2011

## 2011-12-01 NOTE — Telephone Encounter (Signed)
Per staff message Crystal I have scheduled the patient for treatment. Crystal advised appts in computer.  JMW

## 2011-12-01 NOTE — Telephone Encounter (Signed)
Pt is aware to pick up her appts the next time she comes in the office to see dr Dayton Scrape in [place].

## 2011-12-08 ENCOUNTER — Other Ambulatory Visit: Payer: Self-pay | Admitting: Oncology

## 2011-12-09 ENCOUNTER — Other Ambulatory Visit: Payer: Self-pay | Admitting: *Deleted

## 2011-12-09 ENCOUNTER — Telehealth: Payer: Self-pay | Admitting: *Deleted

## 2011-12-09 NOTE — Telephone Encounter (Signed)
Message x 2 left by pt stating need to resume use of hydrocodone due to side effects of neurontin. Adaleah states " it make me sleepy all the time..I don't like that "  Pt states she would be in to pick up a prescription for hydrocodone tomorrow ( 5/3 ).  This note reviewed with MD who recommended methadone 5mg  TID for appropriate pain control if neurontin not tolerated. Pt is not to resume the hydrocodone.  Attempt made to reach pt with call dropped post pt answering call.  Prescription obtained and will be left with this note for attending RN for communication with pt on 5/3.

## 2011-12-09 NOTE — Telephone Encounter (Signed)
Pt left message x 2 stating need to obtain a prescription for hydrocodone to use instead of neurontin. " neurontin makes me sleepy all the time and I don't want that ". " I will be up there tomorrow to pick up a prescription ".  This RN reviewed above with MD and recommendation for appropriate pain control is to use methadone 5 mg TID not the hydrocodone.  This RN attempted to contact pt but call was dropped with pt answering.  Prescription obtained and this note will be left for attending RN for 5/3 to discuss with pt.

## 2011-12-10 ENCOUNTER — Encounter: Payer: Self-pay | Admitting: Radiation Oncology

## 2011-12-10 DIAGNOSIS — C801 Malignant (primary) neoplasm, unspecified: Secondary | ICD-10-CM | POA: Insufficient documentation

## 2011-12-14 ENCOUNTER — Telehealth: Payer: Self-pay | Admitting: Oncology

## 2011-12-14 ENCOUNTER — Ambulatory Visit
Admission: RE | Admit: 2011-12-14 | Discharge: 2011-12-14 | Disposition: A | Discharge status: Home / Self Care | Payer: Medicaid Other | Source: Ambulatory Visit | Attending: Radiation Oncology | Admitting: Radiation Oncology

## 2011-12-14 ENCOUNTER — Encounter: Payer: Self-pay | Admitting: Oncology

## 2011-12-14 VITALS — BP 134/94 | HR 99 | Temp 97.1°F | Wt 161.8 lb

## 2011-12-14 DIAGNOSIS — C801 Malignant (primary) neoplasm, unspecified: Secondary | ICD-10-CM

## 2011-12-14 NOTE — Progress Notes (Signed)
Followup note:  [name] returns today approximately 1 month following completion of palliative radiation therapy to her LS spine. She had a good palliative response following her treatment but now has some worsening discomfort along her lower spine. She remains quite active. She tells me Dr. Darnelle Catalan stopped her hydrocodone and has her methadone 5 mg by mouth twice a day. She feels that methadone makes her too groggy.  Physical examination: Back: There is palpable spinal discomfort at approximately L5 down to the right SI joint region. Neurologic examination grossly nonfocal.  Impression: I suspect that she still symptomatic from her disease along L5. Her L5 vertebra/sacrum received 3500 cGy, close to a tolerance dose based on her fractionation. I suspect that her lower spine may be somewhat unstable because of the degree of metastatic disease, particularly at L5.  Plan: She is to avoid strenuous activity and rests. If her pain persists she may be fitted for a back brace. She is to continue with her methadone. She is to see Dr. Darnelle Catalan on May 22. The patient tells that she will be rescanned sometime in June.

## 2011-12-14 NOTE — Progress Notes (Signed)
Ms. Totaro stopped me in the hall to give me her bills for her Medicaid reinstatement. I advised Ms. Elbe I will forward the bills to Terald Sleeper to submit to her caseworker. I forward all information to Finley and advised Alcario Drought to follow-up with Ms. Clearance Coots.

## 2011-12-14 NOTE — Progress Notes (Signed)
Patient here for routine one month follow up post metastatic carcinoma of breast to bone (lumbar/sacral spine). Has buttock pain which she thinks could be from faslodex injections. States she no longer takes hydrocodone and has possibly been prescribed methadone per Dr.Magrinat.

## 2011-12-14 NOTE — Telephone Encounter (Signed)
gve the pt's sister the may-July 2013 appt calendar

## 2011-12-20 ENCOUNTER — Encounter: Payer: Self-pay | Admitting: Oncology

## 2011-12-20 NOTE — Progress Notes (Signed)
Did leave message with patient as a follow up, faxed all bills to case worker as requested.

## 2011-12-29 ENCOUNTER — Ambulatory Visit (HOSPITAL_BASED_OUTPATIENT_CLINIC_OR_DEPARTMENT_OTHER): Payer: Medicare Other | Admitting: Physician Assistant

## 2011-12-29 ENCOUNTER — Ambulatory Visit: Payer: Self-pay

## 2011-12-29 ENCOUNTER — Encounter: Payer: Self-pay | Admitting: Physician Assistant

## 2011-12-29 ENCOUNTER — Other Ambulatory Visit (HOSPITAL_BASED_OUTPATIENT_CLINIC_OR_DEPARTMENT_OTHER): Payer: Medicare Other | Admitting: Lab

## 2011-12-29 ENCOUNTER — Telehealth: Payer: Self-pay | Admitting: Oncology

## 2011-12-29 ENCOUNTER — Ambulatory Visit (HOSPITAL_BASED_OUTPATIENT_CLINIC_OR_DEPARTMENT_OTHER): Payer: Medicare Other

## 2011-12-29 VITALS — BP 138/87 | HR 80 | Temp 98.2°F | Ht 69.0 in | Wt 160.4 lb

## 2011-12-29 DIAGNOSIS — Z170 Estrogen receptor positive status [ER+]: Secondary | ICD-10-CM

## 2011-12-29 DIAGNOSIS — C7951 Secondary malignant neoplasm of bone: Secondary | ICD-10-CM

## 2011-12-29 DIAGNOSIS — M545 Low back pain: Secondary | ICD-10-CM

## 2011-12-29 DIAGNOSIS — C801 Malignant (primary) neoplasm, unspecified: Secondary | ICD-10-CM

## 2011-12-29 DIAGNOSIS — C50919 Malignant neoplasm of unspecified site of unspecified female breast: Secondary | ICD-10-CM

## 2011-12-29 DIAGNOSIS — Z5111 Encounter for antineoplastic chemotherapy: Secondary | ICD-10-CM

## 2011-12-29 DIAGNOSIS — C7952 Secondary malignant neoplasm of bone marrow: Secondary | ICD-10-CM

## 2011-12-29 LAB — COMPREHENSIVE METABOLIC PANEL
ALT: 17 U/L (ref 0–35)
AST: 13 U/L (ref 0–37)
Albumin: 3.6 g/dL (ref 3.5–5.2)
Alkaline Phosphatase: 84 U/L (ref 39–117)
BUN: 14 mg/dL (ref 6–23)
Creatinine, Ser: 0.96 mg/dL (ref 0.50–1.10)
Potassium: 4.5 mEq/L (ref 3.5–5.3)

## 2011-12-29 LAB — CBC WITH DIFFERENTIAL/PLATELET
BASO%: 0 % (ref 0.0–2.0)
EOS%: 0.3 % (ref 0.0–7.0)
HCT: 34.2 % — ABNORMAL LOW (ref 34.8–46.6)
MCH: 28.2 pg (ref 25.1–34.0)
MCHC: 33 g/dL (ref 31.5–36.0)
NEUT%: 90.2 % — ABNORMAL HIGH (ref 38.4–76.8)
RBC: 4.01 10*6/uL (ref 3.70–5.45)
WBC: 7.9 10*3/uL (ref 3.9–10.3)
lymph#: 0.4 10*3/uL — ABNORMAL LOW (ref 0.9–3.3)

## 2011-12-29 LAB — CANCER ANTIGEN 27.29: CA 27.29: 40 U/mL — ABNORMAL HIGH (ref 0–39)

## 2011-12-29 MED ORDER — FULVESTRANT 250 MG/5ML IM SOLN
500.0000 mg | Freq: Once | INTRAMUSCULAR | Status: AC
  Administered 2011-12-29: 500 mg via INTRAMUSCULAR
  Filled 2011-12-29: qty 10

## 2011-12-29 MED ORDER — METOCLOPRAMIDE HCL 5 MG PO TABS: 5.0000 mg | ORAL_TABLET | Freq: Three times a day (TID) | ORAL | Status: DC | PRN

## 2011-12-29 MED ORDER — LORAZEPAM 1 MG PO TABS
0.5000 mg | ORAL_TABLET | Freq: Once | ORAL | Status: AC
  Administered 2011-12-29: 0.5 mg via ORAL

## 2011-12-29 MED ORDER — ZOLEDRONIC ACID 4 MG/100ML IV SOLN
4.0000 mg | Freq: Once | INTRAVENOUS | Status: AC
  Administered 2011-12-29: 4 mg via INTRAVENOUS
  Filled 2011-12-29: qty 100

## 2011-12-29 MED ORDER — FULVESTRANT 250 MG/5ML IM SOLN
500.0000 mg | INTRAMUSCULAR | Status: DC
  Filled 2011-12-29: qty 10

## 2011-12-29 MED ORDER — METHADONE HCL 5 MG PO TABS: 5.0000 mg | ORAL_TABLET | Freq: Three times a day (TID) | ORAL | Status: DC | PRN

## 2011-12-29 NOTE — Telephone Encounter (Signed)
gve the pt her June,july 2013 appt calendar along with the ct chest appt

## 2011-12-29 NOTE — Progress Notes (Signed)
ID: Kristin [name]   DOB: 06-Feb-1966  MR#: [id number]  JXB#:[id number]  HISTORY OF PRESENT ILLNESS: Kristin [name] was originally seen in the hospital for an initial consult on September 17, 2009. At that time, she presented with back pain and was found to have a markedly abnormal left breast and axilla as well as lytic bone lesions. We obtained a biopsy of the left breast on February 8th which showed a high-grade tumor which was ER 100% receptor positive but Her-2 and progesterone receptor negative. The proliferation fraction was elevated at 33%. We then obtained a bone biopsy on February 11th and her T12 vertebral body indeed was found to harbor an estrogen receptor positive, progesterone receptor negative carcinoma consistent with her breast primary.  She had complete staging studies including a brain MRI with and without contrast which showed no evidence of metastatic disease to the brain, and a CT of the abdomen and pelvis which showed no liver lesions and no other lesions other than in the bones. There was a fibroid uterus incidentally noted. She had a CT of the chest previously (February 7th) which showed only some chronic appearing changes in the left upper lobe, consistent with her history of treated tuberculosis. Again, there were multiple lytic bone lesions.  Kristin [name] was treated in the neoadjuvant setting of docetaxel cyclophosphamide and doxorubicin x6, completed June 2011. She also received radiation therapy to T3 through T12.  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 Grade 2, negative sentinel lymph node. Patient was started on tamoxifen in July 2011, in addition to zoledronic acid which is been given monthly. Her subsequent treatment is as detailed below.  INTERVAL HISTORY: Kristin [name] returns today accompanied by her mother for followup of her metastatic breast carcinoma. She is  currently receiving Faslodex injections monthly, and is due for her next injection today. She's also receiving Zoladex every 3 months, last given 11/03/2011. She receives zoledronic acid monthly, due for next dose today.  Kristin [name] has tapered off of dexamethasone since there last appointment here.  She has tried taking tramadol and gabapentin as prescribed for her pain, but tells me it "just doesn't work". She tells me her pain is always of 5 or higher on a scale of 1-10. The pain is primarily in the back, lumbar and thoracic regions. In fact, the pain is Kristin [name] is biggest concern today, and over half of our 45 minute appointment today was spent discussing pain control, reviewing her pain regimen, and coordinating care.  REVIEW OF SYSTEMS: Kristin [name] and is taking her Effexor regularly and although she still has some depression and anxiety, she tells me her emotional state is "a little better". She denies any suicidal ideations.   Otherwise, Kristin [name] has had no fevers or chills. No rashes or abnormal bleeding. No vaginal bleeding.  She has occasional nausea, treated effectively with ondansetron. No change in bowel habits. No cough or increased shortness of breath. No abnormal headaches or dizziness. She continues to have some slightly blurred vision but no change in vision. No significant peripheral swelling.  A detailed review of systems is otherwise noncontributory.  PAST MEDICAL HISTORY: Past Medical History  Diagnosis Date  . Breast lump   . Thyroid disease   . Hypertension   . 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 Hx of radiation therapy 06/2010, 10/2009, 11/2011    L breast, T3-T12 spine, L-S spine  .  Breast cancer 09/2009    L breast, ER+, PR-, Her 2 -    PAST SURGICAL HISTORY: Past Surgical History  Procedure Date  . Knee arthroscopy     left  . Breast surgery     left lumpectomy    FAMILY HISTORY Patient's mother is alive and in her mid 47s. She has a history of treated  tuberculosis. The patient's father died from a drug overdose, and the patient does not know much about him. She has one brother with hypertension and one sister, Kristin [name]. There is no known history of breast or ovarian cancer in the family.   GYNECOLOGIC HISTORY: Gx P1, first pregnancy to term at age 46. Her son was premature.  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 She is currently unemployed. She has been living with her mother, Kristin [name]. The patient's son is in high school. The patient is a Control and instrumentation engineer.    ADVANCED DIRECTIVES:  HEALTH MAINTENANCE: History  Substance Use Topics  . Smoking status: Current Everyday Smoker  . Smokeless tobacco: Not on file  . Alcohol Use: No     Colonoscopy:  PAP:  Bone density:  Lipid panel:  No Known Allergies  Current Outpatient Prescriptions  Medication Sig Dispense Refill  . fulvestrant (FASLODEX) 250 MG/5ML injection Inject 500 mg into the muscle every 30 (thirty) days. One injection each buttock over 1-2 minutes. Warm prior to use.      . gabapentin (NEURONTIN) 100 MG capsule 1 po tid prn pain  90 capsule  1  . goserelin (ZOLADEX) 10.8 MG injection Inject 10.8 mg into the skin every 3 (three) months.      . levothyroxine (SYNTHROID, LEVOTHROID) 150 MCG tablet Take 1 tablet (150 mcg total) by mouth daily.  30 tablet  11  . LORazepam (ATIVAN) 0.5 MG tablet Take 1 tablet (0.5 mg total) by mouth 2 (two) times daily as needed for anxiety. For pain  60 tablet  0  . ondansetron (ZOFRAN) 8 MG tablet Take 1 tablet (8 mg total) by mouth every 12 (twelve) hours as needed. For nausea  20 tablet  1  . traMADol (ULTRAM) 50 MG tablet Take 1 tablet (50 mg total) by mouth every 6 (six) hours as needed. For pain  120 tablet  1  . venlafaxine XR (EFFEXOR-XR) 75 MG 24 hr capsule Take 1 capsule (75 mg total) by mouth daily.  30 capsule  1  . Zoledronic Acid (ZOMETA IV) Inject 4 mg into the vein every 30  (thirty) days.      Marland Kitchen dexamethasone (DECADRON) 4 MG tablet 1 po qam, then taper as directed  15 tablet  0  . methadone (DOLOPHINE) 5 MG tablet Take 1 tablet (5 mg total) by mouth 3 (three) times daily as needed for pain.  90 tablet  0  . metoCLOPramide (REGLAN) 5 MG tablet Take 1 tablet (5 mg total) by mouth 3 (three) times daily as needed (nausea).  90 tablet  1    OBJECTIVE: Middle-aged [place] American woman who appears fit Filed Vitals:   12/29/11 1145  BP: 138/87  Pulse: 80  Temp: 98.2 F (36.8 C)     Body mass index is 23.69 kg/(m^2).    ECOG FS: 1  Physical Exam: HEENT:  Sclerae anicteric, conjunctivae pink.  Oropharynx clear.  No mucositis or candidiasis.   Nodes:  No cervical, supraclavicular, or axillary lymphadenopathy palpated.  Breast Exam:  Deferred  Lungs:  Clear  to auscultation bilaterally.  No crackles, rhonchi, or wheezes.   Heart:  Regular rate and rhythm.   Abdomen:  Soft, nontender.  Positive bowel sounds.  No organomegaly or masses palpated.   Musculoskeletal: Mild diffuse spinal tenderness to palpation.  Extremities:  Benign.  No peripheral edema or cyanosis.   Skin:  Benign.   Neuro:  Nonfocal. Alert and oriented x3 with pleasant affect.   LAB RESULTS: Lab Results  Component Value Date   WBC 7.9 12/29/2011   NEUTROABS 7.1* 12/29/2011   HGB 11.3* 12/29/2011   HCT 34.2* 12/29/2011   MCV 85.3 12/29/2011   PLT 272 12/29/2011      Chemistry      Component Value Date/Time   NA 141 11/15/2011 1628   K 3.5 11/15/2011 1628   CL 104 11/15/2011 1628   CO2 30 11/15/2011 1628   BUN 16 11/15/2011 1628   CREATININE 0.93 11/15/2011 1628      Component Value Date/Time   CALCIUM 8.7 11/15/2011 1628   ALKPHOS 77 11/15/2011 1628   AST 16 11/15/2011 1628   ALT 22 11/15/2011 1628   BILITOT 0.2* 11/15/2011 1628       Lab Results  Component Value Date   LABCA2 179* 10/26/2011   CA 27-29 was 55 in October of 2012 and 179 most recently  CMET and CA27.29 were both repeated today,  results pending.  STUDIES: No new studies found  ASSESSMENT: A 46 year old [place] woman with history of breast cancer stage IV at diagnosis February 2011, with metastases to bone and possibly developing spread to lymph nodes, lungs and liver   (1) status post 30 Gy to T-3 through T-18 October 2009  (2) status post docetaxel, cyclophosphamide and doxorubicin x6, completed in June 2011.  (3) status post Left lumpectomy and sentinel lymph node biopsy August 2011 for a 2-mm focus of residual invasive ductal carcinoma, grade 2, negative sentinel lymph node  (4) tamoxifen started July 2011, with evidence of progression March 2013  (5) status post radiation to Left breast completed November 2011  (6) now receiving radiation to the lower spine, scheduled through 11/15/2011  (7) fulvestrant/goserelin started 11/03/2011  (8) continuing on zolendronic acid monthly   PLAN: This case was reviewed with Dr. Darnelle Catalan who also spoke with the patient and her mother extensively today regarding Jazlynne's pain medications. He explained that we could not prescribe hydrocodone or oxycodone any longer, but are willing to prescribe methadone. We also offered a referral to the pain clinic.  Kaicee recalls that methadone has made her nauseous in the past. Dr. Darnelle Catalan has recommended trying 5 mg of methadone 3 times daily, with 5 mg of metoclopramide taken with each dose. Hopefully this will decrease any nausea Aloura might have associated with the methadone.  In the meanwhile, we will also place a referral to the pain clinic, although Mercades knows it can take several weeks to get an appointment. She does understand that she would need to be completely "clean" and that she could not miss any of her scheduled appointments at the pain clinic. She also understands that one she transitions to the pain clinic, we would be unable to prescribe any pain medications through this office. This Derra and her mother  voice understanding and agreement with this plan.  Otherwise, Dylynn will proceed to treatment today as scheduled for her monthly doses of zoledronic acid and Faslodex. She return on June 19 for her next treatment, and will also be due for her next every  3 monthly dose of Zoladex. She scheduled for restaging scans in July, after which she will see Dr. Darnelle Catalan to review those results.   Maysel Mccolm    12/29/2011

## 2011-12-29 NOTE — Patient Instructions (Signed)
[facility] Discharge Instructions for Patients Receiving Chemotherapy  Today you received the following chemotherapy agents Zometa and Faslodex.  To help prevent nausea and vomiting after your treatment, we encourage you to take your nausea medication. Begin taking it as often as prescribed for by your physician.    If you develop nausea and vomiting that is not controlled by your nausea medication, call the clinic. If it is after clinic hours your family physician or the after hours number for the clinic or go to the Emergency Department.   BELOW ARE SYMPTOMS THAT SHOULD BE REPORTED IMMEDIATELY:  *FEVER GREATER THAN 100.5 F  *CHILLS WITH OR WITHOUT FEVER  NAUSEA AND VOMITING THAT IS NOT CONTROLLED WITH YOUR NAUSEA MEDICATION  *UNUSUAL SHORTNESS OF BREATH  *UNUSUAL BRUISING OR BLEEDING  TENDERNESS IN MOUTH AND THROAT WITH OR WITHOUT PRESENCE OF ULCERS  *URINARY PROBLEMS  *BOWEL PROBLEMS  UNUSUAL RASH Items with * indicate a potential emergency and should be followed up as soon as possible.  One of the nurses will contact you 24 hours after your treatment. Please let the nurse know about any problems that you may have experienced. Feel free to call the clinic you have any questions or concerns. The clinic phone number is [phone].   I have been informed and understand all the instructions given to me. I know to contact the clinic, my physician, or go to the Emergency Department if any problems should occur. I do not have any questions at this time, but understand that I may call the clinic during office hours   should I have any questions or need assistance in obtaining follow up care.    __________________________________________  _____________  __________ Signature of Patient or Authorized Representative            Date                   Time    __________________________________________ Nurse's Signature

## 2012-01-26 ENCOUNTER — Other Ambulatory Visit: Payer: Self-pay | Admitting: Lab

## 2012-01-26 ENCOUNTER — Other Ambulatory Visit: Payer: Self-pay | Admitting: Oncology

## 2012-01-26 ENCOUNTER — Ambulatory Visit: Payer: Self-pay

## 2012-01-27 ENCOUNTER — Telehealth: Payer: Self-pay | Admitting: Oncology

## 2012-01-27 ENCOUNTER — Other Ambulatory Visit: Payer: Self-pay | Admitting: Physician Assistant

## 2012-01-27 NOTE — Telephone Encounter (Signed)
S/w the pt's family member regarding the pt's appts for 2:45pm for lab and zometa and injection

## 2012-01-28 ENCOUNTER — Other Ambulatory Visit: Payer: Self-pay | Admitting: Lab

## 2012-01-28 ENCOUNTER — Ambulatory Visit: Payer: Self-pay

## 2012-02-04 ENCOUNTER — Ambulatory Visit (HOSPITAL_BASED_OUTPATIENT_CLINIC_OR_DEPARTMENT_OTHER): Payer: Medicare Other

## 2012-02-04 ENCOUNTER — Telehealth: Payer: Self-pay | Admitting: *Deleted

## 2012-02-04 ENCOUNTER — Ambulatory Visit: Payer: Medicare Other | Admitting: Lab

## 2012-02-04 ENCOUNTER — Other Ambulatory Visit: Payer: Self-pay | Admitting: *Deleted

## 2012-02-04 VITALS — BP 133/92 | HR 89 | Temp 98.6°F

## 2012-02-04 DIAGNOSIS — C50919 Malignant neoplasm of unspecified site of unspecified female breast: Secondary | ICD-10-CM

## 2012-02-04 DIAGNOSIS — F329 Major depressive disorder, single episode, unspecified: Secondary | ICD-10-CM

## 2012-02-04 DIAGNOSIS — C7951 Secondary malignant neoplasm of bone: Secondary | ICD-10-CM

## 2012-02-04 DIAGNOSIS — C801 Malignant (primary) neoplasm, unspecified: Secondary | ICD-10-CM

## 2012-02-04 DIAGNOSIS — Z5111 Encounter for antineoplastic chemotherapy: Secondary | ICD-10-CM

## 2012-02-04 DIAGNOSIS — F410 Panic disorder [episodic paroxysmal anxiety] without agoraphobia: Secondary | ICD-10-CM

## 2012-02-04 LAB — CBC WITH DIFFERENTIAL/PLATELET
BASO%: 0.2 % (ref 0.0–2.0)
Eosinophils Absolute: 0.2 10*3/uL (ref 0.0–0.5)
HCT: 35.1 % (ref 34.8–46.6)
LYMPH%: 8.6 % — ABNORMAL LOW (ref 14.0–49.7)
MCHC: 33 g/dL (ref 31.5–36.0)
MCV: 83.8 fL (ref 79.5–101.0)
MONO#: 0.6 10*3/uL (ref 0.1–0.9)
MONO%: 9.1 % (ref 0.0–14.0)
NEUT%: 79.7 % — ABNORMAL HIGH (ref 38.4–76.8)
Platelets: 184 10*3/uL (ref 145–400)
RBC: 4.19 10*6/uL (ref 3.70–5.45)
nRBC: 0 % (ref 0–0)

## 2012-02-04 LAB — COMPREHENSIVE METABOLIC PANEL
CO2: 27 mEq/L (ref 19–32)
Creatinine, Ser: 1.03 mg/dL (ref 0.50–1.10)
Glucose, Bld: 97 mg/dL (ref 70–99)
Total Bilirubin: 0.3 mg/dL (ref 0.3–1.2)

## 2012-02-04 MED ORDER — ZOLEDRONIC ACID 4 MG/5ML IV CONC
4.0000 mg | Freq: Once | INTRAVENOUS | Status: AC
  Administered 2012-02-04: 4 mg via INTRAVENOUS
  Filled 2012-02-04: qty 5

## 2012-02-04 MED ORDER — LORAZEPAM 1 MG PO TABS
0.5000 mg | ORAL_TABLET | Freq: Once | ORAL | Status: AC
  Administered 2012-02-04: 0.5 mg via ORAL

## 2012-02-04 MED ORDER — FULVESTRANT 250 MG/5ML IM SOLN
500.0000 mg | Freq: Once | INTRAMUSCULAR | Status: AC
  Administered 2012-02-04: 500 mg via INTRAMUSCULAR
  Filled 2012-02-04: qty 10

## 2012-02-04 MED ORDER — SODIUM CHLORIDE 0.9 % IV SOLN
Freq: Once | INTRAVENOUS | Status: AC
  Administered 2012-02-04: 13:00:00 via INTRAVENOUS

## 2012-02-04 MED ORDER — GOSERELIN ACETATE 10.8 MG ~~LOC~~ IMPL
10.8000 mg | DRUG_IMPLANT | Freq: Once | SUBCUTANEOUS | Status: AC
  Administered 2012-02-04: 10.8 mg via SUBCUTANEOUS
  Filled 2012-02-04: qty 10.8

## 2012-02-04 NOTE — Patient Instructions (Signed)
Fulvestrant injection What is this medicine? FULVESTRANT (ful VES trant) blocks the effects of estrogen. It is used to treat breast cancer in women past the age of menopause. This medicine may be used for other purposes; ask your health care provider or pharmacist if you have questions. What should I tell my health care provider before I take this medicine? They need to know if you have any of these conditions: -bleeding problems -liver disease -low levels of platelets in the blood -an unusual or allergic reaction to fulvestrant, other medicines, foods, dyes, or preservatives -pregnant or trying to get pregnant -breast-feeding How should I use this medicine? This medicine is for injection into a muscle. It is usually given by a health care professional in a hospital or clinic setting. Talk to your pediatrician regarding the use of this medicine in children. Special care may be needed. Overdosage: If you think you have taken too much of this medicine contact a poison control center or emergency room at once. NOTE: This medicine is only for you. Do not share this medicine with others. What if I miss a dose? It is important not to miss your dose. Call your doctor or health care professional if you are unable to keep an appointment. What may interact with this medicine? -medicines that treat or prevent blood clots like warfarin, enoxaparin, and dalteparin This list may not describe all possible interactions. Give your health care provider a list of all the medicines, herbs, non-prescription drugs, or dietary supplements you use. Also tell them if you smoke, drink alcohol, or use illegal drugs. Some items may interact with your medicine. What should I watch for while using this medicine? Your condition will be monitored carefully while you are receiving this medicine. You will need important blood work done while you are taking this medicine. Do not become pregnant while taking this medicine.  Women should inform their doctor if they wish to become pregnant or think they might be pregnant. There is a potential for serious side effects to an unborn child. Talk to your health care professional or pharmacist for more information. What side effects may I notice from receiving this medicine? Side effects that you should report to your doctor or health care professional as soon as possible: -allergic reactions like skin rash, itching or hives, swelling of the face, lips, or tongue -feeling faint or lightheaded, falls -fever or flu-like symptoms -sore throat -vaginal bleeding Side effects that usually do not require medical attention (report to your doctor or health care professional if they continue or are bothersome): -aches, pains -constipation or diarrhea -headache -hot flashes -nausea, vomiting -pain at site where injected -stomach pain This list may not describe all possible side effects. Call your doctor for medical advice about side effects. You may report side effects to FDA at 1-800-FDA-1088. Where should I keep my medicine? This drug is given in a hospital or clinic and will not be stored at home. NOTE: This sheet is a summary. It may not cover all possible information. If you have questions about this medicine, talk to your doctor, pharmacist, or health care provider.  2012, Elsevier/Gold Standard. (12/04/2007 3:39:24 PM)Goserelin injection  What is this medicine? GOSERELIN (GOE se rel in) is similar to a hormone found in the body. It lowers the amount of sex hormones that the body makes. Men will have lower testosterone levels and women will have lower estrogen levels while taking this medicine. In men, this medicine is used to treat prostate cancer; the  injection is either given once per month or once every 12 weeks. A once per month injection (only) is used to treat women with endometriosis, dysfunctional uterine bleeding, or advanced breast cancer. This medicine may be  used for other purposes; ask your health care provider or pharmacist if you have questions. What should I tell my health care provider before I take this medicine? They need to know if you have any of these conditions (some only apply to women): -diabetes -heart disease or previous heart attack -high blood pressure -high cholesterol -kidney disease -osteoporosis or low bone density -problems passing urine -spinal cord injury -stroke -tobacco smoker -an unusual or allergic reaction to goserelin, hormone therapy, other medicines, foods, dyes, or preservatives -pregnant or trying to get pregnant -breast-feeding How should I use this medicine? This medicine is for injection under the skin. It is given by a health care professional in a hospital or clinic setting. Men receive this injection once every 4 weeks or once every 12 weeks. Women will only receive the once every 4 weeks injection. Talk to your pediatrician regarding the use of this medicine in children. Special care may be needed. Overdosage: If you think you have taken too much of this medicine contact a poison control center or emergency room at once. NOTE: This medicine is only for you. Do not share this medicine with others. What if I miss a dose? It is important not to miss your dose. Call your doctor or health care professional if you are unable to keep an appointment. What may interact with this medicine? -female hormones like estrogen -herbal or dietary supplements like black cohosh, chasteberry, or DHEA -female hormones like testosterone -prasterone This list may not describe all possible interactions. Give your health care provider a list of all the medicines, herbs, non-prescription drugs, or dietary supplements you use. Also tell them if you smoke, drink alcohol, or use illegal drugs. Some items may interact with your medicine. What should I watch for while using this medicine? Visit your doctor or health care professional  for regular checks on your progress. Your symptoms may appear to get worse during the first weeks of this therapy. Tell your doctor or healthcare professional if your symptoms do not start to get better or if they get worse after this time. Your bones may get weaker if you take this medicine for a long time. If you smoke or frequently drink alcohol you may increase your risk of bone loss. A family history of osteoporosis, chronic use of drugs for seizures (convulsions), or corticosteroids can also increase your risk of bone loss. Talk to your doctor about how to keep your bones strong. This medicine should stop regular monthly menstration in women. Tell your doctor if you continue to [facility]. Women should not become pregnant while taking this medicine or for 12 weeks after stopping this medicine. Women should inform their doctor if they wish to become pregnant or think they might be pregnant. There is a potential for serious side effects to an unborn child. Talk to your health care professional or pharmacist for more information. Do not breast-feed an infant while taking this medicine. Men should inform their doctors if they wish to father a child. This medicine may lower sperm counts. Talk to your health care professional or pharmacist for more information. What side effects may I notice from receiving this medicine? Side effects that you should report to your doctor or health care professional as soon as possible: -allergic reactions like skin  rash, itching or hives, swelling of the face, lips, or tongue -bone pain -breathing problems -changes in vision -chest pain -feeling faint or lightheaded, falls -fever, chills -pain, swelling, warmth in the leg -pain, tingling, numbness in the hands or feet -swelling of the ankles, feet, hands -trouble passing urine or change in the amount of urine -unusually high or low blood pressure -unusually weak or tired Side effects that usually do not require  medical attention (report to your doctor or health care professional if they continue or are bothersome): -change in sex drive or performance -changes in breast size in both males and females -changes in emotions or moods -headache -hot flashes -irritation at site where injected -loss of appetite -skin problems like acne, dry skin -vaginal dryness This list may not describe all possible side effects. Call your doctor for medical advice about side effects. You may report side effects to FDA at 1-800-FDA-1088. Where should I keep my medicine? This drug is given in a hospital or clinic and will not be stored at home.  NOTE: This sheet is a summary. It may not cover all possible information. If you have questions about this medicine, talk to your doctor, pharmacist, or health care provider.  2012, [employer]vier/Gold Standard. (12/10/2008 1:28:29 PM)Zoledronic Acid injection (Hypercalcemia, Oncology) What is this medicine? ZOLEDRONIC ACID (ZOE le dron ik AS id) lowers the amount of calcium loss from bone. It is used to treat too much calcium in your blood from cancer. It is also used to prevent complications of cancer that has spread to the bone. This medicine may be used for other purposes; ask your health care provider or pharmacist if you have questions. What should I tell my health care provider before I take this medicine? They need to know if you have any of these conditions: -aspirin-sensitive asthma -dental disease -kidney disease -an unusual or allergic reaction to zoledronic acid, other medicines, foods, dyes, or preservatives -pregnant or trying to get pregnant -breast-feeding How should I use this medicine? This medicine is for infusion into a vein. It is given by a health care professional in a hospital or clinic setting. Talk to your pediatrician regarding the use of this medicine in children. Special care may be needed. Overdosage: If you think you have taken too much of this  medicine contact a poison control center or emergency room at once. NOTE: This medicine is only for you. Do not share this medicine with others. What if I miss a dose? It is important not to miss your dose. Call your doctor or health care professional if you are unable to keep an appointment. What may interact with this medicine? -certain antibiotics given by injection -NSAIDs, medicines for pain and inflammation, like ibuprofen or naproxen -some diuretics like bumetanide, furosemide -teriparatide -thalidomide This list may not describe all possible interactions. Give your health care provider a list of all the medicines, herbs, non-prescription drugs, or dietary supplements you use. Also tell them if you smoke, drink alcohol, or use illegal drugs. Some items may interact with your medicine. What should I watch for while using this medicine? Visit your doctor or health care professional for regular checkups. It may be some time before you see the benefit from this medicine. Do not stop taking your medicine unless your doctor tells you to. Your doctor may order blood tests or other tests to see how you are doing. Women should inform their doctor if they wish to become pregnant or think they might be pregnant. There  is a potential for serious side effects to an unborn child. Talk to your health care professional or pharmacist for more information. You should make sure that you get enough calcium and vitamin D while you are taking this medicine. Discuss the foods you eat and the vitamins you take with your health care professional. Some people who take this medicine have severe bone, joint, and/or muscle pain. This medicine may also increase your risk for a broken thigh bone. Tell your doctor right away if you have pain in your upper leg or groin. Tell your doctor if you have any pain that does not go away or that gets worse. What side effects may I notice from receiving this medicine? Side effects  that you should report to your doctor or health care professional as soon as possible: -allergic reactions like skin rash, itching or hives, swelling of the face, lips, or tongue -anxiety, confusion, or depression -breathing problems -changes in vision -feeling faint or lightheaded, falls -jaw burning, cramping, pain -muscle cramps, stiffness, or weakness -trouble passing urine or change in the amount of urine Side effects that usually do not require medical attention (report to your doctor or health care professional if they continue or are bothersome): -bone, joint, or muscle pain -fever -hair loss -irritation at site where injected -loss of appetite -nausea, vomiting -stomach upset -tired This list may not describe all possible side effects. Call your doctor for medical advice about side effects. You may report side effects to FDA at 1-800-FDA-1088. Where should I keep my medicine? This drug is given in a hospital or clinic and will not be stored at home. NOTE: This sheet is a summary. It may not cover all possible information. If you have questions about this medicine, talk to your doctor, pharmacist, or health care provider.  2012, Elsevier/Gold Standard. (01/22/2011 9:06:58 AM)

## 2012-02-08 ENCOUNTER — Other Ambulatory Visit: Payer: Self-pay | Admitting: Physician Assistant

## 2012-02-08 DIAGNOSIS — C50919 Malignant neoplasm of unspecified site of unspecified female breast: Secondary | ICD-10-CM

## 2012-02-08 MED ORDER — VENLAFAXINE HCL ER 75 MG PO CP24: 75.0000 mg | ORAL_CAPSULE | Freq: Every day | ORAL | Status: DC

## 2012-02-08 MED ORDER — HYDROCODONE-ACETAMINOPHEN 5-325 MG PO TABS: ORAL_TABLET | ORAL | Status: DC

## 2012-02-08 MED ORDER — LORAZEPAM 0.5 MG PO TABS: 0.5000 mg | ORAL_TABLET | Freq: Two times a day (BID) | ORAL | Status: DC | PRN

## 2012-02-09 ENCOUNTER — Telehealth: Payer: Self-pay | Admitting: *Deleted

## 2012-02-09 ENCOUNTER — Other Ambulatory Visit: Payer: Self-pay | Admitting: *Deleted

## 2012-02-09 DIAGNOSIS — C50919 Malignant neoplasm of unspecified site of unspecified female breast: Secondary | ICD-10-CM

## 2012-02-09 MED ORDER — ONDANSETRON HCL 8 MG PO TABS: 8.0000 mg | ORAL_TABLET | Freq: Two times a day (BID) | ORAL | Status: DC | PRN

## 2012-02-09 NOTE — Telephone Encounter (Signed)
Trying for two days to reach patient.  D.O.B. With [facility] is 1966-06-17.  D.O.B. With [facility] is 1966/03/09.  Message left on patient's home number yesterday and today message left on voicemail of sister Dois Davenport.  Refills left on Pharmacy voicemail for lorazepam and Surescripts for ondansetron.

## 2012-02-14 NOTE — Telephone Encounter (Signed)
error 

## 2012-02-21 ENCOUNTER — Ambulatory Visit (HOSPITAL_COMMUNITY)
Admission: RE | Admit: 2012-02-21 | Discharge: 2012-02-21 | Disposition: A | Discharge status: Home / Self Care | Payer: Medicare Other | Source: Ambulatory Visit | Attending: Physician Assistant | Admitting: Physician Assistant

## 2012-02-21 DIAGNOSIS — J438 Other emphysema: Secondary | ICD-10-CM | POA: Insufficient documentation

## 2012-02-21 DIAGNOSIS — C7951 Secondary malignant neoplasm of bone: Secondary | ICD-10-CM | POA: Insufficient documentation

## 2012-02-21 DIAGNOSIS — C50919 Malignant neoplasm of unspecified site of unspecified female breast: Secondary | ICD-10-CM | POA: Insufficient documentation

## 2012-02-21 DIAGNOSIS — J984 Other disorders of lung: Secondary | ICD-10-CM | POA: Insufficient documentation

## 2012-02-21 MED ORDER — IOHEXOL 300 MG/ML  SOLN: 100.0000 mL | Freq: Once | INTRAMUSCULAR | Status: AC | PRN

## 2012-02-23 ENCOUNTER — Ambulatory Visit: Payer: Self-pay | Admitting: Oncology

## 2012-02-23 ENCOUNTER — Ambulatory Visit: Payer: Self-pay

## 2012-02-23 ENCOUNTER — Other Ambulatory Visit: Payer: Self-pay | Admitting: Lab

## 2012-03-07 ENCOUNTER — Telehealth: Payer: Self-pay | Admitting: *Deleted

## 2012-03-07 NOTE — Telephone Encounter (Signed)
Message left by pt stating " I need a nurse to call me back at this number..it should show up on her caller ID "  No further message given.  Number per call noted as [phone].  This RN returned call to above and post multiple rings phone was answered with noted background noise but no one was speaking. Line disconnected itself.

## 2012-03-10 ENCOUNTER — Telehealth: Payer: Self-pay | Admitting: *Deleted

## 2012-03-10 ENCOUNTER — Other Ambulatory Visit: Payer: Self-pay | Admitting: Oncology

## 2012-03-10 NOTE — Telephone Encounter (Signed)
Gave patient appointment for 03-14-2012 patient stated she missed her appointments due to illness and pain in her leg

## 2012-03-10 NOTE — Progress Notes (Signed)
Kristin [name] showed up today for an unscheduled appointment. She had missed her appointment a couple weeks ago, and her has run out of her pain medicine. I rewrote her methadone, 5 mg, to take 3 times daily, giving her 30 tablets. I also wrote her for hydrochlorothiazide 25 mg. I made her an appointment to see [place] again in August 6. We will review her situation at that time.

## 2012-03-14 ENCOUNTER — Telehealth: Payer: Self-pay | Admitting: Oncology

## 2012-03-14 ENCOUNTER — Other Ambulatory Visit (HOSPITAL_BASED_OUTPATIENT_CLINIC_OR_DEPARTMENT_OTHER): Payer: Medicare Other | Admitting: Lab

## 2012-03-14 ENCOUNTER — Encounter: Payer: Self-pay | Admitting: Physician Assistant

## 2012-03-14 ENCOUNTER — Encounter: Payer: Self-pay | Admitting: Nutrition

## 2012-03-14 ENCOUNTER — Ambulatory Visit (HOSPITAL_BASED_OUTPATIENT_CLINIC_OR_DEPARTMENT_OTHER): Payer: Medicare Other | Admitting: Physician Assistant

## 2012-03-14 ENCOUNTER — Ambulatory Visit (HOSPITAL_BASED_OUTPATIENT_CLINIC_OR_DEPARTMENT_OTHER): Payer: Medicare Other

## 2012-03-14 VITALS — BP 122/78 | HR 92 | Temp 98.0°F | Resp 20 | Ht 69.0 in | Wt 159.1 lb

## 2012-03-14 DIAGNOSIS — C801 Malignant (primary) neoplasm, unspecified: Secondary | ICD-10-CM

## 2012-03-14 DIAGNOSIS — C7951 Secondary malignant neoplasm of bone: Secondary | ICD-10-CM

## 2012-03-14 DIAGNOSIS — C50919 Malignant neoplasm of unspecified site of unspecified female breast: Secondary | ICD-10-CM

## 2012-03-14 DIAGNOSIS — K219 Gastro-esophageal reflux disease without esophagitis: Secondary | ICD-10-CM

## 2012-03-14 DIAGNOSIS — F410 Panic disorder [episodic paroxysmal anxiety] without agoraphobia: Secondary | ICD-10-CM

## 2012-03-14 DIAGNOSIS — Z5111 Encounter for antineoplastic chemotherapy: Secondary | ICD-10-CM

## 2012-03-14 DIAGNOSIS — F329 Major depressive disorder, single episode, unspecified: Secondary | ICD-10-CM

## 2012-03-14 LAB — BASIC METABOLIC PANEL
BUN: 14 mg/dL (ref 6–23)
CO2: 29 mEq/L (ref 19–32)
Chloride: 102 mEq/L (ref 96–112)
Creatinine, Ser: 0.85 mg/dL (ref 0.50–1.10)
Potassium: 4.1 mEq/L (ref 3.5–5.3)

## 2012-03-14 MED ORDER — LORAZEPAM 1 MG PO TABS
0.5000 mg | ORAL_TABLET | Freq: Once | ORAL | Status: AC
Start: 2012-03-14 — End: 2012-03-14
  Administered 2012-03-14: 0.5 mg via ORAL

## 2012-03-14 MED ORDER — LORAZEPAM 2 MG/ML IJ SOLN
0.5000 mg | Freq: Once | INTRAMUSCULAR | Status: AC
  Administered 2012-03-14: 0.5 mg via INTRAVENOUS

## 2012-03-14 MED ORDER — METOCLOPRAMIDE HCL 5 MG PO TABS: 10.0000 mg | ORAL_TABLET | Freq: Three times a day (TID) | ORAL | Status: DC | PRN

## 2012-03-14 MED ORDER — OMEPRAZOLE 40 MG PO CPDR: 40.0000 mg | DELAYED_RELEASE_CAPSULE | Freq: Every day | ORAL | Status: DC

## 2012-03-14 MED ORDER — ZOLEDRONIC ACID 4 MG/100ML IV SOLN
4.0000 mg | Freq: Once | INTRAVENOUS | Status: AC
  Administered 2012-03-14: 4 mg via INTRAVENOUS
  Filled 2012-03-14: qty 100

## 2012-03-14 MED ORDER — FULVESTRANT 250 MG/5ML IM SOLN
500.0000 mg | INTRAMUSCULAR | Status: DC
  Administered 2012-03-14: 500 mg via INTRAMUSCULAR
  Filled 2012-03-14: qty 10

## 2012-03-14 NOTE — Progress Notes (Signed)
I received a call from Zollie Scale requesting Boost samples or coupons for patient.  I was not able to catch patient before she left and she was not home when I called.  I have mailed some Boost coupons to her and my contact information for further needs.

## 2012-03-14 NOTE — Progress Notes (Signed)
ID: Kristin [name]   DOB: 13-Dec-1965  MR#: [id number]  WUJ#:[id number]  HISTORY OF PRESENT ILLNESS: Kristin [name] was originally seen in the hospital for an initial consult on September 17, 2009. At that time, she presented with back pain and was found to have a markedly abnormal left breast and axilla as well as lytic bone lesions. We obtained a biopsy of the left breast on February 8th which showed a high-grade tumor which was ER 100% receptor positive but Her-2 and progesterone receptor negative. The proliferation fraction was elevated at 33%. We then obtained a bone biopsy on February 11th and her T12 vertebral body indeed was found to harbor an estrogen receptor positive, progesterone receptor negative carcinoma consistent with her breast primary.  She had complete staging studies including a brain MRI with and without contrast which showed no evidence of metastatic disease to the brain, and a CT of the abdomen and pelvis which showed no liver lesions and no other lesions other than in the bones. There was a fibroid uterus incidentally noted. She had a CT of the chest previously (February 7th) which showed only some chronic appearing changes in the left upper lobe, consistent with her history of treated tuberculosis. Again, there were multiple lytic bone lesions.  Kristin [name] was treated in the neoadjuvant setting of docetaxel cyclophosphamide and doxorubicin x6, completed June 2011. She also received radiation therapy to T3 through T12.  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 Grade 2, negative sentinel lymph node. Patient was started on tamoxifen in July 2011, in addition to zoledronic acid which is been given monthly. Her subsequent treatment is as detailed below.  INTERVAL HISTORY: Kristin [name] returns today accompanied by her mother and her son for followup of her metastatic breast carcinoma.  She is currently receiving Faslodex injections monthly, and is due for her next injection today. She's also receiving Zoladex every 3 months, last given in June, and due again in late September.. She receives zoledronic acid monthly, due for next dose today.  Kristin [name] tells me she "doesn't feel great". She had some nausea yesterday with one episode of emesis. She has noticed an increase in reflux. She is, however, keeping herself well hydrated. She's had no change in bowel habits. Her pain is reasonably well-controlled on methadone, 5 mg 3 times daily, which Dr. Darnelle Catalan refill last Friday on August 2. (This was a 10 day refill, 30 tablets)  REVIEW OF SYSTEMS: Kristin [name] has had no fevers or chills. No mouth ulcers, oral sensitivity, jaw pain, or recent dental procedures. No rashes or abnormal bleeding. No vaginal bleeding.   No cough or increased shortness of breath. No abnormal headaches or dizziness. She continues to have some slightly blurred vision but no change in vision. No significant peripheral swelling.  A detailed review of systems is otherwise stable and noncontributory.  PAST MEDICAL HISTORY: Past Medical History  Diagnosis Date  . Breast lump   . Thyroid disease   . Hypertension   . 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 Hx of radiation therapy 06/2010, 10/2009, 11/2011    L breast, T3-T12 spine, L-S spine  . Breast cancer 09/2009    L breast, ER+, PR-, Her 2 -    PAST SURGICAL HISTORY: Past Surgical History  Procedure Date  . Knee arthroscopy     left  . Breast surgery     left  lumpectomy    FAMILY HISTORY Patient's mother is alive and in her mid 34s. She has a history of treated tuberculosis. The patient's father died from a drug overdose, and the patient does not know much about him. She has one brother with hypertension and one sister, Kristin [name]. There is no known history of breast or ovarian cancer in the family.   GYNECOLOGIC HISTORY: Gx P1, first pregnancy to term at age  12. Her son was premature.  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 She is currently unemployed. She has been living with her mother, Kristin [name]. The patient's son is in high school. The patient is a Control and instrumentation engineer.    ADVANCED DIRECTIVES:  HEALTH MAINTENANCE: History  Substance Use Topics  . Smoking status: Current Everyday Smoker  . Smokeless tobacco: Not on file  . Alcohol Use: No     Colonoscopy:  PAP:  Bone density:  Lipid panel:  No Known Allergies  Current Outpatient Prescriptions  Medication Sig Dispense Refill  . dexamethasone (DECADRON) 4 MG tablet 1 po qam, then taper as directed  15 tablet  0  . fulvestrant (FASLODEX) 250 MG/5ML injection Inject 500 mg into the muscle every 30 (thirty) days. One injection each buttock over 1-2 minutes. Warm prior to use.      . gabapentin (NEURONTIN) 100 MG capsule 1 po tid prn pain  90 capsule  1  . goserelin (ZOLADEX) 10.8 MG injection Inject 10.8 mg into the skin every 3 (three) months.      Marland Kitchen HYDROcodone-acetaminophen (NORCO) 5-325 MG per tablet TAKE ONE TAB BY MOUTH THREE TIMES A DAY AS NEEDED FOR PAIN  90 tablet  0  . levothyroxine (SYNTHROID, LEVOTHROID) 150 MCG tablet Take 1 tablet (150 mcg total) by mouth daily.  30 tablet  11  . LORazepam (ATIVAN) 0.5 MG tablet Take 1 tablet (0.5 mg total) by mouth 2 (two) times daily as needed for anxiety. For pain  60 tablet  0  . methadone (DOLOPHINE) 5 MG tablet Take 5 mg by mouth every 8 (eight) hours.      . metoCLOPramide (REGLAN) 5 MG tablet Take 2 tablets (10 mg total) by mouth 3 (three) times daily as needed (nausea).  30 tablet  3  . omeprazole (PRILOSEC) 40 MG capsule Take 1 capsule (40 mg total) by mouth daily.  30 capsule  3  . ondansetron (ZOFRAN) 8 MG tablet Take 1 tablet (8 mg total) by mouth every 12 (twelve) hours as needed. For nausea  20 tablet  1  . ULTRAM 50 MG tablet TAKE 1 TABLET EVERY 6 HOURS AS NEEDED FOR PAIN   120 each  0  . venlafaxine XR (EFFEXOR-XR) 75 MG 24 hr capsule Take 1 capsule (75 mg total) by mouth daily.  30 capsule  0  . Zoledronic Acid (ZOMETA IV) Inject 4 mg into the vein every 30 (thirty) days.      Marland Kitchen DISCONTD: metoCLOPramide (REGLAN) 5 MG tablet Take 1 tablet (5 mg total) by mouth 3 (three) times daily as needed (nausea).  90 tablet  1    OBJECTIVE: Middle-aged [place] American woman who appears fit and in no acute distress Filed Vitals:   03/14/12 1236  BP: 122/78  Pulse: 92  Temp: 98 F (36.7 C)  Resp: 20     Body mass index is 23.49 kg/(m^2).    ECOG FS: 1 Filed Weights   03/14/12 1236  Weight: 159 lb 1.6 oz (  72.167 kg)   Physical Exam: HEENT:  Sclerae anicteric.  Oropharynx clear.   Nodes:  No cervical, supraclavicular, or axillary lymphadenopathy palpated.  Breast Exam:  Deferred  Lungs:  Clear to auscultation bilaterally.  No crackles, rhonchi, or wheezes.   Heart:  Regular rate and rhythm.   Abdomen:  Soft, nontender.  Positive bowel sounds.  No organomegaly or masses palpated.   Musculoskeletal: Mild diffuse spinal tenderness to palpation.  Extremities:  Benign.  No peripheral edema or cyanosis.   Skin:  Benign.   Neuro:  Nonfocal. Alert and oriented x3.   LAB RESULTS: Lab Results  Component Value Date   WBC 6.1 02/04/2012   NEUTROABS 4.9 02/04/2012   HGB 11.6 02/04/2012   HCT 35.1 02/04/2012   MCV 83.8 02/04/2012   PLT 184 02/04/2012      Chemistry      Component Value Date/Time   NA 140 02/04/2012 1152   K 4.6 02/04/2012 1152   CL 104 02/04/2012 1152   CO2 27 02/04/2012 1152   BUN 12 02/04/2012 1152   CREATININE 1.03 02/04/2012 1152      Component Value Date/Time   CALCIUM 8.9 02/04/2012 1152   ALKPHOS 65 02/04/2012 1152   AST 13 02/04/2012 1152   ALT 9 02/04/2012 1152   BILITOT 0.3 02/04/2012 1152       Lab Results  Component Value Date   LABCA2 60* 02/04/2012   CA 27-29 was 55 in October of 2012 and 179 most recently   STUDIES:  Ct Chest W  Contrast  02/21/2012  *RADIOLOGY REPORT*  Clinical Data: Breast cancer with metastatic disease to bone.  CT CHEST WITH CONTRAST  Technique:  Multidetector CT imaging of the chest was performed following the standard protocol during bolus administration of intravenous contrast.  Contrast:  80 ml Omnipaque-300  Comparison: Multiple exams, including 10/19/2011  Findings: Right supraclavicular node short axis 0.7 cm, stable. Upper prevascular lymph node short axis 1.2 cm, stable by my measurements.  Additional stable AP window lymph nodes are present. A subcarinal node measures 1.5 cm in short axis (formerly 1.2 cm). A thick rind of lymphoid tissue is present around the right mainstem bronchus and right upper lobe bronchus on image 22 of series 2, with similar indistinct nodal tissue in the left hilum. A left hilar node measures 1.1 cm in short axis on image 21 of series 2. The right hilar node has a short axis diameter of 2.2 cm on image 24 of series 2 (formerly 2.2 cm by my measurements).  Postoperative findings noted in the left breast rind like pleural thickening noted at the left lung apex with associated biapical bullous lung disease.  Suspected bronchiectasis noted in the left upper lobe with scarring and possible paramediastinal radiation fibrosis.  Chronic volume loss noted in the left lung  There is increased sclerosis along a metastatic deposit at the T10 vertebral body, with less of a purely lytic appearance.  Increased conspicuity of metastatic disease at the L1 level noted eccentric to the right.  Stable compression fractures at T5 and T6 noted. Stable diffuse osseous metastatic disease throughout the ribs noted.  There is stable mild nodularity in the lungs.  Stable small hypodensity medially in the right hepatic lobe on image 58 of series 2 is observed.  IMPRESSION:  1.  Increased sclerosis of some of the metastatic lesions including the T10 and L1 vertebral levels.  I cannot exclude some progression of  the metastatic lesion at L1 given  the degree of increased conspicuity, although occasionally such sclerosis may be due to effective interval treatment. 2.  Increased soft tissue rind around the right main stem and right upper lobe bronchi compatible with thickening of lymphoid tissue. Mixed appearance of mediastinal lymph nodes with some smaller, some larger, and many stable. 3.  Emphysema. 4.  Stable bullous lung disease and indistinct rind of density at the left lung apex. Stable vague nodularity and portions of the lungs.  Original Report Authenticated By: Dellia Cloud, M.D.    ASSESSMENT: A 46 year old [place] woman with history of breast cancer stage IV at diagnosis February 2011, with metastases to bone and possibly developing spread to lymph nodes, lungs and liver   (1) status post 30 Gy to T-3 through T-18 October 2009  (2) status post docetaxel, cyclophosphamide and doxorubicin x6, completed in June 2011.  (3) status post Left lumpectomy and sentinel lymph node biopsy August 2011 for a 2-mm focus of residual invasive ductal carcinoma, grade 2, negative sentinel lymph node  (4) tamoxifen started July 2011, with evidence of progression March 2013  (5) status post radiation to Left breast completed November 2011  (6) now receiving radiation to the lower spine, scheduled through 11/15/2011  (7) fulvestrant/goserelin started 11/03/2011  (8) continuing on zolendronic acid monthly   PLAN: Kristin [name] will proceed to treatment today as scheduled for her next monthly injection of fulvestrant as well as her next monthly infusion of zoledronic acid. I have given her a refill on her metoclopramide and have also prescribed omeprazole to take daily for the nausea and reflux.  She understands that we can refill her methadone next Monday, August 12. She's currently prescribed 5 mg 3 times daily which is controlling her pain "okay".   Kristin [name] will return in a couple of weeks for followup with  Dr. Darnelle Catalan. They'll review in detail the results of her recent scan, which overall showed stability.  They will also be reviewing her current treatment plan. She and her mom both voice understanding and agreement with our plan today, and they know to call with any changes or problems.    Kristin [name]    03/14/2012

## 2012-03-14 NOTE — Patient Instructions (Addendum)
[facility] Discharge Instructions for Patients Receiving Chemotherapy  Today you received the following: Faslodex, Zometa    BELOW ARE SYMPTOMS THAT SHOULD BE REPORTED IMMEDIATELY:  *FEVER GREATER THAN 100.5 F  *CHILLS WITH OR WITHOUT FEVER  NAUSEA AND VOMITING THAT IS NOT CONTROLLED WITH YOUR NAUSEA MEDICATION  *UNUSUAL SHORTNESS OF BREATH  *UNUSUAL BRUISING OR BLEEDING  TENDERNESS IN MOUTH AND THROAT WITH OR WITHOUT PRESENCE OF ULCERS  *URINARY PROBLEMS  *BOWEL PROBLEMS  UNUSUAL RASH Items with * indicate a potential emergency and should be followed up as soon as possible.   Feel free to call the clinic you have any questions or concerns. The clinic phone number is [phone].

## 2012-03-14 NOTE — Telephone Encounter (Signed)
gve the pt her aug 2013 appt calendar 

## 2012-03-16 ENCOUNTER — Other Ambulatory Visit: Payer: Self-pay | Admitting: *Deleted

## 2012-03-16 DIAGNOSIS — C50919 Malignant neoplasm of unspecified site of unspecified female breast: Secondary | ICD-10-CM

## 2012-03-16 MED ORDER — LORAZEPAM 0.5 MG PO TABS: 0.5000 mg | ORAL_TABLET | Freq: Two times a day (BID) | ORAL | Status: DC | PRN

## 2012-03-16 MED ORDER — VENLAFAXINE HCL ER 75 MG PO CP24: 75.0000 mg | ORAL_CAPSULE | Freq: Every day | ORAL | Status: DC

## 2012-03-16 MED ORDER — GABAPENTIN 100 MG PO CAPS: ORAL_CAPSULE | ORAL | Status: DC

## 2012-03-16 MED ORDER — TRAMADOL HCL 50 MG PO TABS: 50.0000 mg | ORAL_TABLET | Freq: Four times a day (QID) | ORAL | Status: DC | PRN

## 2012-03-16 MED ORDER — HYDROCODONE-ACETAMINOPHEN 5-325 MG PO TABS: ORAL_TABLET | ORAL | Status: DC

## 2012-03-17 ENCOUNTER — Telehealth: Payer: Self-pay | Admitting: *Deleted

## 2012-03-17 NOTE — Telephone Encounter (Signed)
Reports vomiting for past two days "can't keep any food down, not even milk". Suggested she stay away from dairy products if she is nauseated. She does not think she has a fever (no thermometer) and not having any diarrhea. Also reports "my body aches all over". She has no nausea med on hand (has not picked up the Ativan script called in on 03/16/12). Spoke with Zollie Scale, PA who suggests she go to emergency room to be evaluated--doubtful her symptoms are related to her cancer diagnosis or her treatment, except for some of the aches could be from the Zometa, but she has had this med in past. Patient agrees to ER

## 2012-03-18 ENCOUNTER — Emergency Department (HOSPITAL_COMMUNITY): Payer: Medicare Other

## 2012-03-18 ENCOUNTER — Encounter (HOSPITAL_COMMUNITY): Payer: Self-pay | Admitting: *Deleted

## 2012-03-18 ENCOUNTER — Emergency Department (HOSPITAL_COMMUNITY)
Admission: EM | Admit: 2012-03-18 | Discharge: 2012-03-18 | Disposition: A | Discharge status: Home / Self Care | Payer: Medicare Other | Attending: Emergency Medicine | Admitting: Emergency Medicine

## 2012-03-18 DIAGNOSIS — F17200 Nicotine dependence, unspecified, uncomplicated: Secondary | ICD-10-CM | POA: Insufficient documentation

## 2012-03-18 DIAGNOSIS — R110 Nausea: Secondary | ICD-10-CM | POA: Insufficient documentation

## 2012-03-18 DIAGNOSIS — I10 Essential (primary) hypertension: Secondary | ICD-10-CM | POA: Insufficient documentation

## 2012-03-18 DIAGNOSIS — Z853 Personal history of malignant neoplasm of breast: Secondary | ICD-10-CM | POA: Insufficient documentation

## 2012-03-18 DIAGNOSIS — M25519 Pain in unspecified shoulder: Secondary | ICD-10-CM | POA: Insufficient documentation

## 2012-03-18 DIAGNOSIS — C50919 Malignant neoplasm of unspecified site of unspecified female breast: Secondary | ICD-10-CM

## 2012-03-18 DIAGNOSIS — E079 Disorder of thyroid, unspecified: Secondary | ICD-10-CM | POA: Insufficient documentation

## 2012-03-18 DIAGNOSIS — Z85830 Personal history of malignant neoplasm of bone: Secondary | ICD-10-CM | POA: Insufficient documentation

## 2012-03-18 LAB — POCT I-STAT, CHEM 8
Creatinine, Ser: 1.1 mg/dL (ref 0.50–1.10)
Glucose, Bld: 112 mg/dL — ABNORMAL HIGH (ref 70–99)
HCT: 37 % (ref 36.0–46.0)
Hemoglobin: 12.6 g/dL (ref 12.0–15.0)
Potassium: 3.9 mEq/L (ref 3.5–5.1)
TCO2: 26 mmol/L (ref 0–100)

## 2012-03-18 LAB — CBC WITH DIFFERENTIAL/PLATELET
Basophils Relative: 0 % (ref 0–1)
Eosinophils Absolute: 0.1 10*3/uL (ref 0.0–0.7)
Eosinophils Relative: 2 % (ref 0–5)
Hemoglobin: 11.2 g/dL — ABNORMAL LOW (ref 12.0–15.0)
Lymphs Abs: 0.7 10*3/uL (ref 0.7–4.0)
MCH: 26.4 pg (ref 26.0–34.0)
MCHC: 32.4 g/dL (ref 30.0–36.0)
MCV: 81.6 fL (ref 78.0–100.0)
Monocytes Relative: 6 % (ref 3–12)
Neutrophils Relative %: 81 % — ABNORMAL HIGH (ref 43–77)
Platelets: 345 10*3/uL (ref 150–400)
RBC: 4.24 MIL/uL (ref 3.87–5.11)

## 2012-03-18 MED ORDER — PROMETHAZINE HCL 25 MG PO TABS: 25.0000 mg | ORAL_TABLET | Freq: Four times a day (QID) | ORAL | Status: DC | PRN

## 2012-03-18 MED ORDER — ONDANSETRON 4 MG PO TBDP
4.0000 mg | ORAL_TABLET | Freq: Once | ORAL | Status: AC
  Administered 2012-03-18: 4 mg via ORAL
  Filled 2012-03-18: qty 1

## 2012-03-18 MED ORDER — HYDROCODONE-ACETAMINOPHEN 5-325 MG PO TABS: 1.0000 | ORAL_TABLET | Freq: Three times a day (TID) | ORAL | Status: DC | PRN

## 2012-03-18 NOTE — ED Notes (Signed)
Pt requests multiple times only wants to find out the results of her shoulder xray

## 2012-03-18 NOTE — ED Notes (Signed)
Pt has stage 4 Breast in left breast and states that she is now having right shoulder pain that hurts with movement.  No injury to shoulder.  Pt reports that she has had vomiting and needs medication for nausea.  Pt gets shots for cancer and states nausea is not related to the shots

## 2012-03-18 NOTE — ED Notes (Signed)
RN asked pt if she has had a mastectomy or lumpectomy on either breast and she stated, "No". After RN took blood pressure on her left arm, she stated that I shouldn't have because that's where she had her surgery.

## 2012-03-18 NOTE — ED Provider Notes (Signed)
History    This chart was scribed for Kristin Munch, MD, MD by Smitty Pluck. The patient was seen in room TR04C and the patient's care was started at 7:00PM.   CSN: [id number]  Arrival date & time 03/18/12  1643   None     Chief Complaint  Patient presents with  . Shoulder Pain    right  . Nausea    (Consider location/radiation/quality/duration/timing/severity/associated sxs/prior treatment) Patient is a 46 y.o. female presenting with shoulder pain. The history is provided by the patient.  Shoulder Pain   [name] is a 46 y.o. female who presents to the Emergency Department complaining of moderate nausea and right shoulder pain onset 3 days ago. Pt reports taking tramadol and oxycodone without relief. She has taken OTC nausea medication without relief. Pt reports that she has stage 4 breast cancer that has metastasized to right back and gets treatment 1x per month. Her last treatment was 5 days ago. Reports shoulder pain is aggravated by movement. Symptoms have been constant. Pain is rated at 7/10. Oncologist is Dr. Darnelle Catalan   Past Medical History  Diagnosis Date  . Breast lump   . Thyroid disease   . Hypertension   . 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 Hx of radiation therapy 06/2010, 10/2009, 11/2011    L breast, T3-T12 spine, L-S spine  . Breast cancer 09/2009    L breast, ER+, PR-, Her 2 -    Past Surgical History  Procedure Date  . Knee arthroscopy     left  . Breast surgery     left lumpectomy    No family history on file.  History  Substance Use Topics  . Smoking status: Current Everyday Smoker  . Smokeless tobacco: Not on file  . Alcohol Use: No    OB History    Grav Para Term Preterm Abortions TAB SAB Ect Mult Living                  Review of Systems  Constitutional: Negative for fever and chills.       HPI  HENT:       HPI otherwise negative  Eyes: Negative.   Respiratory:       HPI, otherwise negative  Cardiovascular:       HPI,  otherwise nmegative  Gastrointestinal: Negative for nausea, vomiting and diarrhea.  Genitourinary: Negative.        HPI, otherwise negative  Musculoskeletal:       HPI, otherwise negative  Skin: Negative.   Neurological: Negative for syncope.    Allergies  Review of patient's allergies indicates no known allergies.  Home Medications   Current Outpatient Rx  Name Route Sig Dispense Refill  . HYDROCODONE-ACETAMINOPHEN 5-325 MG PO TABS Oral Take 1 tablet by mouth 3 (three) times daily as needed. For pain    . LEVOTHYROXINE SODIUM 150 MCG PO TABS Oral Take 1 tablet (150 mcg total) by mouth daily. 30 tablet 11  . LORAZEPAM 0.5 MG PO TABS Oral Take 0.5 mg by mouth 2 (two) times daily as needed. For pain    . METHADONE HCL 5 MG PO TABS Oral Take 5 mg by mouth every 8 (eight) hours.    Marland Kitchen METOCLOPRAMIDE HCL 5 MG PO TABS Oral Take 10 mg by mouth 3 (three) times daily as needed. For nausea    . OMEPRAZOLE 40 MG PO CPDR Oral Take 40 mg by mouth daily.    Marland Kitchen ONDANSETRON HCL 8  MG PO TABS Oral Take 8 mg by mouth every 12 (twelve) hours as needed. For nausea    . TRAMADOL HCL 50 MG PO TABS Oral Take 50 mg by mouth every 6 (six) hours as needed. For pain    . VENLAFAXINE HCL ER 75 MG PO CP24 Oral Take 75 mg by mouth daily.      BP 123/87  Pulse 106  Temp 97.7 F (36.5 C) (Oral)  Resp 16  SpO2 96%  Physical Exam  Nursing note and vitals reviewed. Constitutional: She is oriented to person, place, and time. She appears well-developed and well-nourished.  HENT:  Head: Normocephalic and atraumatic.  Cardiovascular: Normal rate, regular rhythm and normal heart sounds.   Pulmonary/Chest: Effort normal. No respiratory distress.  Musculoskeletal:       Distal clavicle tenderness  Lateral humerus tenderness  Neurovascular intact  Neurological: She is alert and oriented to person, place, and time.  Skin: Skin is warm and dry.  Psychiatric: She has a normal mood and affect. Her behavior is normal.      ED Course  Procedures (including critical care time)  COORDINATION OF CARE: 7:08PM EDP discusses pt ED treatment with pt  7:10PM EDP ordered medication:  Scheduled Meds:   . ondansetron  4 mg Oral Once      Labs Reviewed  CBC WITH DIFFERENTIAL - Abnormal; Notable for the following:    Hemoglobin 11.2 (*)     HCT 34.6 (*)     RDW 16.0 (*)     Neutrophils Relative 81 (*)     Lymphocytes Relative 11 (*)     All other components within normal limits  POCT I-STAT, CHEM 8 - Abnormal; Notable for the following:    Glucose, Bld 112 (*)     All other components within normal limits  URINALYSIS, ROUTINE W REFLEX MICROSCOPIC   Dg Shoulder Right  03/18/2012  *RADIOLOGY REPORT*  Clinical Data: Right shoulder pain  RIGHT SHOULDER - 2+ VIEW  Comparison: None.  Findings: No fracture or dislocation is seen.  The joint spaces are preserved.  Visualized right lung is clear.  IMPRESSION: Normal right shoulder radiographs.  Original Report Authenticated By: Charline Bills, M.D.     No diagnosis found.    MDM  I personally performed the services described in this documentation, which was scribed in my presence. The recorded information has been reviewed and considered.  This female with stage IV breast cancer now presents with new right shoulder pain, worse with motion, and nausea.  Notably, the patient is in no distress during my exam, she has focal tenderness about the lateral humeral head and anterior superior shoulder, without appreciable deformity.  I reviewed the x-ray with the patient X-ray does not demonstrate acute changes, though metastatic spread was a fall.  Hasn't dyspnea, tachypnea, PE is considered, but unlikely.  The patient was also not tachycardic on my exam.  Given the patient's endorsement of not having any pain medication currently, and no nausea medicine either, she was provided these, discharged per her request to follow up with her oncologist as soon as  possible.   Kristin Munch, MD 03/18/12 [phone]

## 2012-03-21 ENCOUNTER — Other Ambulatory Visit: Payer: Self-pay | Admitting: *Deleted

## 2012-03-22 ENCOUNTER — Ambulatory Visit: Payer: Self-pay

## 2012-03-30 ENCOUNTER — Emergency Department (HOSPITAL_COMMUNITY)
Admission: EM | Admit: 2012-03-30 | Discharge: 2012-03-30 | Disposition: A | Discharge status: Home / Self Care | Payer: Medicare Other | Attending: Emergency Medicine | Admitting: Emergency Medicine

## 2012-03-30 ENCOUNTER — Encounter (HOSPITAL_COMMUNITY): Payer: Self-pay

## 2012-03-30 DIAGNOSIS — Z79899 Other long term (current) drug therapy: Secondary | ICD-10-CM | POA: Insufficient documentation

## 2012-03-30 DIAGNOSIS — I10 Essential (primary) hypertension: Secondary | ICD-10-CM | POA: Insufficient documentation

## 2012-03-30 DIAGNOSIS — R112 Nausea with vomiting, unspecified: Secondary | ICD-10-CM | POA: Insufficient documentation

## 2012-03-30 DIAGNOSIS — C7951 Secondary malignant neoplasm of bone: Secondary | ICD-10-CM | POA: Insufficient documentation

## 2012-03-30 DIAGNOSIS — K3184 Gastroparesis: Secondary | ICD-10-CM

## 2012-03-30 DIAGNOSIS — C50919 Malignant neoplasm of unspecified site of unspecified female breast: Secondary | ICD-10-CM | POA: Insufficient documentation

## 2012-03-30 DIAGNOSIS — Z923 Personal history of irradiation: Secondary | ICD-10-CM | POA: Insufficient documentation

## 2012-03-30 DIAGNOSIS — F17200 Nicotine dependence, unspecified, uncomplicated: Secondary | ICD-10-CM | POA: Insufficient documentation

## 2012-03-30 DIAGNOSIS — R109 Unspecified abdominal pain: Secondary | ICD-10-CM | POA: Insufficient documentation

## 2012-03-30 DIAGNOSIS — K5289 Other specified noninfective gastroenteritis and colitis: Secondary | ICD-10-CM | POA: Insufficient documentation

## 2012-03-30 LAB — CBC WITH DIFFERENTIAL/PLATELET
Basophils Absolute: 0 10*3/uL (ref 0.0–0.1)
Basophils Relative: 0 % (ref 0–1)
Eosinophils Absolute: 0 10*3/uL (ref 0.0–0.7)
MCH: 25.7 pg — ABNORMAL LOW (ref 26.0–34.0)
MCHC: 32.8 g/dL (ref 30.0–36.0)
Monocytes Relative: 8 % (ref 3–12)
Neutro Abs: 6.2 10*3/uL (ref 1.7–7.7)
Neutrophils Relative %: 83 % — ABNORMAL HIGH (ref 43–77)
Platelets: 404 10*3/uL — ABNORMAL HIGH (ref 150–400)
RDW: 15.6 % — ABNORMAL HIGH (ref 11.5–15.5)

## 2012-03-30 LAB — COMPREHENSIVE METABOLIC PANEL
CO2: 23 mEq/L (ref 19–32)
Calcium: 9.2 mg/dL (ref 8.4–10.5)
Creatinine, Ser: 0.71 mg/dL (ref 0.50–1.10)
GFR calc Af Amer: >90 mL/min (ref >90)
GFR calc non Af Amer: >90 mL/min (ref >90)
Glucose, Bld: 91 mg/dL (ref 70–99)

## 2012-03-30 MED ORDER — PROMETHAZINE HCL 25 MG PO TABS: 25.0000 mg | ORAL_TABLET | Freq: Four times a day (QID) | ORAL | Status: DC | PRN

## 2012-03-30 MED ORDER — OXYCODONE-ACETAMINOPHEN 5-325 MG PO TABS: 1.0000 | ORAL_TABLET | Freq: Four times a day (QID) | ORAL | Status: AC | PRN

## 2012-03-30 MED ORDER — ONDANSETRON HCL 4 MG/2ML IJ SOLN
INTRAMUSCULAR | Status: AC
  Administered 2012-03-30: 17:00:00
  Filled 2012-03-30: qty 2

## 2012-03-30 MED ORDER — FENTANYL CITRATE 0.05 MG/ML IJ SOLN
50.0000 ug | INTRAMUSCULAR | Status: AC
  Administered 2012-03-30: 50 ug via INTRAVENOUS
  Filled 2012-03-30: qty 2

## 2012-03-30 MED ORDER — SODIUM CHLORIDE 0.9 % IV BOLUS (SEPSIS)
1000.0000 mL | Freq: Once | INTRAVENOUS | Status: AC
  Administered 2012-03-30: 1000 mL via INTRAVENOUS

## 2012-03-30 MED ORDER — LOPERAMIDE HCL 2 MG PO CAPS: 2.0000 mg | ORAL_CAPSULE | Freq: Four times a day (QID) | ORAL | Status: DC | PRN

## 2012-03-30 MED ORDER — HYDROMORPHONE HCL PF 1 MG/ML IJ SOLN
1.0000 mg | Freq: Once | INTRAMUSCULAR | Status: AC
  Administered 2012-03-30: 1 mg via INTRAVENOUS
  Filled 2012-03-30: qty 1

## 2012-03-30 MED ORDER — ONDANSETRON HCL 4 MG/2ML IJ SOLN
4.0000 mg | Freq: Once | INTRAMUSCULAR | Status: AC
  Administered 2012-03-30: 4 mg via INTRAVENOUS
  Filled 2012-03-30: qty 2

## 2012-03-30 NOTE — ED Provider Notes (Signed)
History     CSN: [id number]  Arrival date & time 03/30/12  1638   First MD Initiated Contact with Patient 03/30/12 1736      Chief Complaint  Patient presents with  . Emesis  . Abdominal Pain    (Consider location/radiation/quality/duration/timing/severity/associated sxs/prior treatment) Patient is a 46 y.o. female presenting with vomiting and abdominal pain. The history is provided by the patient and medical records.  Emesis  Associated symptoms include abdominal pain and diarrhea. Pertinent negatives include no chills, no cough, no fever and no headaches.  Abdominal Pain The primary symptoms of the illness include abdominal pain, nausea, vomiting and diarrhea. The primary symptoms of the illness do not include fever or shortness of breath.  Symptoms associated with the illness do not include chills or diaphoresis.   69, old female, with a history of metastatic breast cancer, presents emergency department complaining of abdominal pain, with nausea, vomiting, diarrhea.  Since yesterday.  She denies fevers, chills.  She denies blood in her stool.  She has had blood in her emesis.  She denies lightheadedness, or shortness of breath.  She has not been exposed to anybody with similar symptoms and she denies recent antibiotic use.  Presently, her abdominal pain, and nausea are resolved.  Past Medical History  Diagnosis Date  . Breast lump   . Thyroid disease   . Hypertension   . 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 Hx of radiation therapy 06/2010, 10/2009, 11/2011    L breast, T3-T12 spine, L-S spine  . Breast cancer 09/2009    L breast, ER+, PR-, Her 2 -    Past Surgical History  Procedure Date  . Knee arthroscopy     left  . Breast surgery     left lumpectomy    No family history on file.  History  Substance Use Topics  . Smoking status: Current Everyday Smoker  . Smokeless tobacco: Not on file  . Alcohol Use: No    OB History    Grav Para Term Preterm Abortions TAB  SAB Ect Mult Living                  Review of Systems  Constitutional: Negative for fever, chills and diaphoresis.  Respiratory: Negative for cough and shortness of breath.   Cardiovascular: Negative for chest pain and palpitations.  Gastrointestinal: Positive for nausea, vomiting, abdominal pain and diarrhea. Negative for blood in stool.  Skin: Negative for rash.  Neurological: Negative for weakness and headaches.  Psychiatric/Behavioral: Negative for confusion.  All other systems reviewed and are negative.    Allergies  Review of patient's allergies indicates no known allergies.  Home Medications   Current Outpatient Rx  Name Route Sig Dispense Refill  . LEVOTHYROXINE SODIUM 150 MCG PO TABS Oral Take 150 mcg by mouth daily.    Marland Kitchen LORAZEPAM 0.5 MG PO TABS Oral Take 0.5 mg by mouth 2 (two) times daily as needed. For pain    . TAMOXIFEN CITRATE 20 MG PO TABS Oral Take 20 mg by mouth daily.    . TRAMADOL HCL 50 MG PO TABS Oral Take 50 mg by mouth every 6 (six) hours as needed. For pain    . VENLAFAXINE HCL ER 75 MG PO CP24 Oral Take 75 mg by mouth daily.    Marland Kitchen PROMETHAZINE HCL 25 MG PO TABS Oral Take 1 tablet (25 mg total) by mouth every 6 (six) hours as needed for nausea. 15 tablet 0  BP 117/61  Pulse 74  Temp 97.8 F (36.6 C) (Oral)  SpO2 96%  Physical Exam  Nursing note and vitals reviewed. Constitutional: She is oriented to person, place, and time. She appears well-developed and well-nourished. No distress.  HENT:  Head: Normocephalic and atraumatic.  Eyes: Conjunctivae are normal.  Neck: Normal range of motion. Neck supple.  Cardiovascular: Normal rate, regular rhythm and intact distal pulses.   No murmur heard. Pulmonary/Chest: Effort normal and breath sounds normal. No respiratory distress.  Abdominal: Soft. Bowel sounds are normal. She exhibits no distension. There is tenderness. There is no rebound and no guarding.       Diffuse mild tenderness, with no  peritoneal signs  Musculoskeletal: Normal range of motion. She exhibits no edema and no tenderness.  Neurological: She is alert and oriented to person, place, and time.  Skin: Skin is warm and dry.  Psychiatric: She has a normal mood and affect. Thought content normal.    ED Course  Procedures (including critical care time) 46 year old, female, with history of metastatic breast cancer, presents emergency department with symptoms consistent with gastroenteritis.  Presently, her nausea and abdominal pain, resolved.  We will establish an IV and give her fluids and check laboratory testing, to see if there is any evidence of systemic illness or dehydration.  Labs Reviewed  CBC WITH DIFFERENTIAL - Abnormal; Notable for the following:    Hemoglobin 10.5 (*)     HCT 32.0 (*)     MCH 25.7 (*)     RDW 15.6 (*)     Platelets 404 (*)     Neutrophils Relative 83 (*)     Lymphocytes Relative 8 (*)     Lymphs Abs 0.6 (*)     All other components within normal limits  COMPREHENSIVE METABOLIC PANEL - Abnormal; Notable for the following:    Sodium 133 (*)     Albumin 2.9 (*)     Total Bilirubin 0.2 (*)     All other components within normal limits   No results found.   No diagnosis found.  sxs resolved  MDM  Gastroenteritis        Cheri Guppy, MD 03/30/12 2102

## 2012-03-30 NOTE — ED Notes (Signed)
Pt requesting nausea and pain medicine.  Will notify MD.

## 2012-03-30 NOTE — ED Notes (Signed)
Per EMS- ptient is from home. Patient has a history of bone cancer. Patient reports that she receives Zofran during chemo treatments , but oncologist has not prescribed any for use at home. Zofran 4 mg IV given for N/V.

## 2012-03-30 NOTE — ED Notes (Signed)
RUE:[id number]<JW> Expected date:03/30/12<BR> Expected time: 4:38 PM<BR> Means of arrival:Ambulance<BR> Comments:<BR> n/v

## 2012-03-31 ENCOUNTER — Ambulatory Visit (HOSPITAL_COMMUNITY)
Admission: RE | Admit: 2012-03-31 | Discharge: 2012-03-31 | Disposition: A | Discharge status: Home / Self Care | Payer: Medicare Other | Source: Ambulatory Visit | Attending: Oncology | Admitting: Oncology

## 2012-03-31 ENCOUNTER — Telehealth: Payer: Self-pay | Admitting: Oncology

## 2012-03-31 ENCOUNTER — Telehealth: Payer: Self-pay | Admitting: *Deleted

## 2012-03-31 ENCOUNTER — Other Ambulatory Visit (HOSPITAL_BASED_OUTPATIENT_CLINIC_OR_DEPARTMENT_OTHER): Payer: Medicare Other | Admitting: Lab

## 2012-03-31 ENCOUNTER — Ambulatory Visit (HOSPITAL_BASED_OUTPATIENT_CLINIC_OR_DEPARTMENT_OTHER): Payer: Medicare Other | Admitting: Oncology

## 2012-03-31 VITALS — BP 124/77 | HR 65 | Temp 97.8°F | Resp 20 | Ht 69.0 in | Wt 154.8 lb

## 2012-03-31 DIAGNOSIS — R110 Nausea: Secondary | ICD-10-CM

## 2012-03-31 DIAGNOSIS — C50119 Malignant neoplasm of central portion of unspecified female breast: Secondary | ICD-10-CM

## 2012-03-31 DIAGNOSIS — M25559 Pain in unspecified hip: Secondary | ICD-10-CM | POA: Insufficient documentation

## 2012-03-31 DIAGNOSIS — C7952 Secondary malignant neoplasm of bone marrow: Secondary | ICD-10-CM

## 2012-03-31 DIAGNOSIS — C50919 Malignant neoplasm of unspecified site of unspecified female breast: Secondary | ICD-10-CM

## 2012-03-31 DIAGNOSIS — C7951 Secondary malignant neoplasm of bone: Secondary | ICD-10-CM

## 2012-03-31 DIAGNOSIS — M948X9 Other specified disorders of cartilage, unspecified sites: Secondary | ICD-10-CM | POA: Insufficient documentation

## 2012-03-31 LAB — CBC WITH DIFFERENTIAL/PLATELET
Basophils Absolute: 0 10*3/uL (ref 0.0–0.1)
EOS%: 5.2 % (ref 0.0–7.0)
Eosinophils Absolute: 0.3 10*3/uL (ref 0.0–0.5)
HGB: 10.6 g/dL — ABNORMAL LOW (ref 11.6–15.9)
NEUT#: 5.2 10*3/uL (ref 1.5–6.5)
RDW: 16.6 % — ABNORMAL HIGH (ref 11.2–14.5)
WBC: 6.7 10*3/uL (ref 3.9–10.3)
lymph#: 0.6 10*3/uL — ABNORMAL LOW (ref 0.9–3.3)

## 2012-03-31 LAB — COMPREHENSIVE METABOLIC PANEL
Albumin: 3 g/dL — ABNORMAL LOW (ref 3.5–5.2)
Alkaline Phosphatase: 76 U/L (ref 39–117)
CO2: 27 mEq/L (ref 19–32)
Glucose, Bld: 102 mg/dL — ABNORMAL HIGH (ref 70–99)
Potassium: 3.6 mEq/L (ref 3.5–5.3)
Sodium: 136 mEq/L (ref 135–145)
Total Protein: 7.1 g/dL (ref 6.0–8.3)

## 2012-03-31 MED ORDER — METOCLOPRAMIDE HCL 10 MG PO TABS: 10.0000 mg | ORAL_TABLET | Freq: Three times a day (TID) | ORAL | Status: AC | PRN

## 2012-03-31 MED ORDER — VENLAFAXINE HCL ER 75 MG PO CP24: 150.0000 mg | ORAL_CAPSULE | Freq: Every day | ORAL | Status: DC

## 2012-03-31 MED ORDER — TRAMADOL HCL 50 MG PO TABS: 50.0000 mg | ORAL_TABLET | Freq: Four times a day (QID) | ORAL | Status: DC | PRN

## 2012-03-31 MED ORDER — GABAPENTIN 300 MG PO CAPS: 300.0000 mg | ORAL_CAPSULE | Freq: Four times a day (QID) | ORAL | Status: DC | PRN

## 2012-03-31 NOTE — Telephone Encounter (Signed)
Per staff message and POF I have scheduled appts.  JMW  

## 2012-03-31 NOTE — Progress Notes (Signed)
ID: Treniya Lobb   DOB: 1966-04-30  MR#: [id number]  WUJ#:[id number]  HISTORY OF PRESENT ILLNESS: Kristin [name] was originally seen in the hospital for an initial consult on September 17, 2009. At that time, Kristin [name] with back pain and was found to have a markedly abnormal left breast and axilla as well as lytic bone lesions. We obtained a biopsy of the left breast on February 8th which showed a high-grade tumor which was ER 100% receptor positive but Her-2 and progesterone receptor negative. The proliferation fraction was elevated at 33%. We then obtained a bone biopsy on February 11th and her T12 vertebral body indeed was found to harbor an estrogen receptor positive, progesterone receptor negative carcinoma consistent with her breast primary.  Kristin had complete staging studies including a brain MRI with and without contrast which showed no evidence of metastatic disease to the brain, and a CT of the abdomen and pelvis which showed no liver lesions and no other lesions other than in the bones. There was a fibroid uterus incidentally noted. Kristin had a CT of the chest previously (February 7th) which showed only some chronic appearing changes in the left upper lobe, consistent with her history of treated tuberculosis. Again, there were multiple lytic bone lesions.  Kristin [name] was treated in the neoadjuvant setting of docetaxel cyclophosphamide and doxorubicin x6, completed June 2011. Kristin also received radiation therapy to T3 through T12.  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 Grade 2, negative sentinel lymph node. Patient was started on tamoxifen in July 2011, in addition to zoledronic acid which is been given monthly. Her subsequent treatment is as detailed below.  INTERVAL HISTORY: Kristin [name] today accompanied by her mother and another relative for followup of her metastatic breast  carcinoma. Since the last visit here, specifically yesterday, Kristin [name] to the emergency room, where Kristin was evaluated. Kristin received some narcotics for pain, and a film of the right shoulder, which is where Kristin was having pain, showed no acute abnormality.  REVIEW OF SYSTEMS: Today the patient tells me that Kristin is "not feeling like herself". I Kristin has more pain in the right leg, from the hip to the toes. Kristin has nausea and is not eating very well. Kristin vomited "all day" yesterday. Kristin has a bottle of Zofran among her medicines but it is empty. Kristin tells me Kristin has no energy. Her sense of taste "comes and goes. Kristin has a little cough but no shortness of breath pleurisy or hemoptysis. Bowel movements and latter function are both "okay" Kristin sleeps poorly. Kristin is having difficulty walking because of the pain in the right leg. Kristin is anxious and depressed. A detailed review of systems was otherwise stable.  PAST MEDICAL HISTORY: Past Medical History  Diagnosis Date  . Breast lump   . Thyroid disease   . Hypertension   . 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 Hx of radiation therapy 06/2010, 10/2009, 11/2011    L breast, T3-T12 spine, [name] spine  . Breast cancer 09/2009    L breast, ER+, PR-, Her 2 -    PAST SURGICAL HISTORY: Past Surgical History  Procedure Date  . Knee arthroscopy     left  . Breast surgery     left lumpectomy    FAMILY HISTORY Patient'[name] mother is alive and in her mid 14s. Kristin has a history  of treated tuberculosis. The patient'[name] father died from a drug overdose, and the patient does not know much about him. Kristin has one brother with hypertension and one sister, Kristin [name]. There is no known history of breast or ovarian cancer in the family.   GYNECOLOGIC HISTORY: Gx P1, first pregnancy to term at age 60. Her son was premature.  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 Kristin is currently unemployed. Kristin has been living  with her mother, Kristin [name], but now has her own place. The patient'[name] son. Kristin [name], has graduated from high school and has applied for a job core position. He is still at home with the patient at least part-time. The patient is a Control and instrumentation engineer.    ADVANCED DIRECTIVES:  HEALTH MAINTENANCE: History  Substance Use Topics  . Smoking status: Current Everyday Smoker  . Smokeless tobacco: Not on file  . Alcohol Use: No     Colonoscopy:  PAP:  Bone density:  Lipid panel:  No Known Allergies  Current Outpatient Prescriptions  Medication Sig Dispense Refill  . levothyroxine (SYNTHROID, LEVOTHROID) 150 MCG tablet Take 150 mcg by mouth daily.      Marland Kitchen LORazepam (ATIVAN) 0.5 MG tablet Take 0.5 mg by mouth 2 (two) times daily as needed. For pain      . oxyCODONE-acetaminophen (PERCOCET/ROXICET) 5-325 MG per tablet Take 1-2 tablets by mouth every 6 (six) hours as needed for pain.  20 tablet  0  . tamoxifen (NOLVADEX) 20 MG tablet Take 20 mg by mouth daily.      . traMADol (ULTRAM) 50 MG tablet Take 1 tablet (50 mg total) by mouth every 6 (six) hours as needed. For pain  120 tablet  0  . venlafaxine XR (EFFEXOR-XR) 75 MG 24 hr capsule Take 2 capsules (150 mg total) by mouth daily.  30 capsule  12  . DISCONTD: venlafaxine XR (EFFEXOR-XR) 75 MG 24 hr capsule Take 75 mg by mouth daily.      Marland Kitchen gabapentin (NEURONTIN) 300 MG capsule Take 1 capsule (300 mg total) by mouth 4 (four) times daily as needed.  120 capsule  6  . metoCLOPramide (REGLAN) 10 MG tablet Take 1 tablet (10 mg total) by mouth 3 (three) times daily as needed.  90 tablet  3   No current facility-administered medications for this visit.   Facility-Administered Medications Ordered in Other Visits  Medication Dose Route Frequency Provider Last Rate Last Dose  . fentaNYL (SUBLIMAZE) injection 50 mcg  50 mcg Intravenous STAT Cheri Guppy, MD   50 mcg at 03/30/12 1714  . HYDROmorphone (DILAUDID) injection 1 mg  1 mg Intravenous Once Cheri Guppy, MD   1 mg at 03/30/12 1958  . ondansetron (ZOFRAN) 4 MG/2ML injection           . ondansetron (ZOFRAN) injection 4 mg  4 mg Intravenous Once Cheri Guppy, MD   4 mg at 03/30/12 1958  . sodium chloride 0.9 % bolus 1,000 mL  1,000 mL Intravenous Once Cheri Guppy, MD   1,000 mL at 03/30/12 1844    OBJECTIVE: Middle-aged [place] American woman who appears uncomfortable Filed Vitals:   03/31/12 1028  BP: 124/77  Pulse: 65  Temp: 97.8 F (36.6 C)  Resp: 20     Body mass index is 22.86 kg/(m^2).    ECOG FS: 2 Filed Weights   03/31/12 1028  Weight: 154 lb 12.8 oz (70.217 kg)   Physical Exam: HEENT:  Sclerae anicteric.  Oropharynx clear.   Nodes:  No cervical, supraclavicular, or axillary lymphadenopathy palpated.  Breast Exam:  The right breast is unremarkable. The left breast is status post lumpectomy. There is no evidence of local recurrence.  Lungs:  Clear to auscultation bilaterally.  No crackles, rhonchi, or wheezes.   Heart:  Regular rate and rhythm.   Abdomen:  Soft, nontender.  Positive bowel sounds.  No organomegaly or masses palpated.   Musculoskeletal: Mild diffuse spinal tenderness to palpation.  Extremities:  No peripheral edema. Kristin is able to straight leg raise on the left to about 45, on the right to about 5 because of pain. Neuro:  Nonfocal.    LAB RESULTS: Lab Results  Component Value Date   WBC 6.7 03/31/2012   NEUTROABS 5.2 03/31/2012   HGB 10.6* 03/31/2012   HCT 33.3* 03/31/2012   MCV 82.2 03/31/2012   PLT 389 03/31/2012      Chemistry      Component Value Date/Time   NA 133* 03/30/2012 1836   K 3.8 03/30/2012 1836   CL 100 03/30/2012 1836   CO2 23 03/30/2012 1836   BUN 9 03/30/2012 1836   CREATININE 0.71 03/30/2012 1836      Component Value Date/Time   CALCIUM 9.2 03/30/2012 1836   ALKPHOS 77 03/30/2012 1836   AST 16 03/30/2012 1836   ALT 13 03/30/2012 1836   BILITOT 0.2* 03/30/2012 1836       Lab Results  Component Value Date    LABCA2 60* 02/04/2012     STUDIES:  Dg Shoulder Right  03/18/2012  *RADIOLOGY REPORT*  Clinical Data: Right shoulder pain  RIGHT SHOULDER - 2+ VIEW  Comparison: None.  Findings: No fracture or dislocation is seen.  The joint spaces are preserved.  Visualized right lung is clear.  IMPRESSION: Normal right shoulder radiographs.  Original Report Authenticated By: Charline Bills, M.D.      ASSESSMENT: A 46 year old [place] woman with history of breast cancer stage IV at diagnosis February 2011, with metastases to bone and possibly developing spread to lymph nodes, lungs and liver  (1) status post 30 Gy to T-3 through T-18 October 2009  (2) status post docetaxel, cyclophosphamide and doxorubicin x6, completed in June 2011.  (3) status post Left lumpectomy and sentinel lymph node biopsy August 2011 for a 2-mm focus of residual invasive ductal carcinoma, grade 2, negative sentinel lymph node  (4) tamoxifen started July 2011, with evidence of progression March 2013  (5) status post radiation to Left breast completed November 2011  (6) [name]/p radiation to the lower spine, completed 11/15/2011  (7) fulvestrant/goserelin started 11/03/2011  (8) continuing on zolendronic acid monthly   PLAN: Kristin [name]. Situation is complex and we spent better than an hour today trying to straighten things out. I realize her social situation is difficult, but I have strongly encouraged her to show up for appointments as scheduled. It is very difficult for [place] to work her in point Kristin shows up on scheduled, because it causes other patients to have to wait a long period of time. We would like to accommodate her but it is again a lot better if Kristin shows up for appointments as scheduled.  The family was very insistent that Kristin have a PCA assistant at home 4 hours 5 days a week. They tell me that they have already called of Medicaid and that they were told this would be feasible. We will certainly placed a request for  that. According to the family  Kristin [name] needs help with transportation, getting to appointment some time, watching close, washing herself, dressing herself, cooking, making sure Kristin eats a regular diet, and making sure Kristin takes her medications appropriately. Certainly all those things are important for her to be able to accomplish. Her son apparently will be going to a job core and her mother will be getting back to work so Kristin is going to be mostly alone.  I brought up the possibility of a nursing home for Kristin [name] if her situation is as difficult as described above. Neither Kristin nor the family want to pursue that option at present. They feel if we can get a PCA worker for her Kristin would be able to do okay at home.  We then [name] the issue of pain. I have prescribed methadone for her but Kristin tells me that medication does not surround and makes her nauseated. I have offered her tramadol and that doesn't work for her well for her. Kristin tells me the gabapentin makes her a little bit of dizzy and confused. I have told her that I am very comfortable prescribing any kind of narcotic for her of because it doesn't work for her well in terms of bony pain, because there is high risk of abuse, and because I feel Kristin should be taking tramadol gabapentin and Naprosyn on a regular basis as her primary for pain control. I would be glad to write for methadone for her if Kristin wished. We also have discussed referral to a pain clinic which Kristin does not want to do at this point.  Today I upped her venlafaxine 250 mg daily. I wrote her for metoclopramide to take for nausea 3 times a day as needed. I really new her gabapentin and tramadol. And I made her appointments for the next 6 months, every 28 days, to receive her fulvestrant and zoledronic acid. Kristin will also continue to receive her goserelin every 3 months. We are placing a request for a PCA help for her. Finally I am setting her up for plain films of the right hip. Kristin knows  to call for any problems that may develop before the next visit. Kristin [name] will proceed to treatment today as scheduled for her next monthly injection of fulvestrant as well as her next monthly infusion of zoledronic acid. I have given her a refill on her metoclopramide and have also prescribed omeprazole to take daily for the nausea and reflux.  Kristin understands that we can refill her methadone next Monday, August 12. Kristin'[name] currently prescribed 5 mg 3 times daily which is controlling her pain "okay".   Kristin [name] will return in a couple of weeks for followup with Dr. Darnelle Catalan. They'll review in detail the results of her recent scan, which overall showed stability.  They will also be reviewing her current treatment plan. Kristin and her mom both voice understanding and agreement with our plan today, and they know to call with any changes or problems.    MAGRINAT,GUSTAV C    03/31/2012

## 2012-03-31 NOTE — Telephone Encounter (Signed)
gve the pt her aug 2013 appt calendar along with the hip x-ray at [facility] for today. The pt is aware to stop by the scheduling desk to pick up the rest of her appts. Sent michelle a staff message

## 2012-04-03 ENCOUNTER — Other Ambulatory Visit: Payer: Self-pay | Admitting: *Deleted

## 2012-04-04 ENCOUNTER — Telehealth: Payer: Self-pay | Admitting: *Deleted

## 2012-04-04 ENCOUNTER — Ambulatory Visit: Payer: Medicare Other

## 2012-04-04 ENCOUNTER — Other Ambulatory Visit: Payer: Self-pay | Admitting: *Deleted

## 2012-04-04 VITALS — BP 98/69 | HR 103 | Temp 97.2°F | Resp 20

## 2012-04-04 DIAGNOSIS — F329 Major depressive disorder, single episode, unspecified: Secondary | ICD-10-CM

## 2012-04-04 DIAGNOSIS — F410 Panic disorder [episodic paroxysmal anxiety] without agoraphobia: Secondary | ICD-10-CM

## 2012-04-04 DIAGNOSIS — C50919 Malignant neoplasm of unspecified site of unspecified female breast: Secondary | ICD-10-CM

## 2012-04-04 DIAGNOSIS — C801 Malignant (primary) neoplasm, unspecified: Secondary | ICD-10-CM

## 2012-04-04 MED ORDER — FULVESTRANT 250 MG/5ML IM SOLN: 500.0000 mg | INTRAMUSCULAR | Status: DC

## 2012-04-04 MED ORDER — GOSERELIN ACETATE 10.8 MG ~~LOC~~ IMPL: 10.8000 mg | DRUG_IMPLANT | Freq: Once | SUBCUTANEOUS | Status: DC

## 2012-04-04 MED ORDER — LORAZEPAM 1 MG PO TABS: 0.5000 mg | ORAL_TABLET | Freq: Once | ORAL | Status: DC

## 2012-04-04 MED ORDER — ZOLEDRONIC ACID 4 MG/5ML IV CONC: 4.0000 mg | Freq: Once | INTRAVENOUS | Status: DC

## 2012-04-04 NOTE — Telephone Encounter (Signed)
This RN faxed per pt's request referral for PCS services to [facility]  [facility].  Call was also received from pt's mother- Jamesetta So stating " my son is coming in on Friday and he wants Myrakle to get a 2nd opinion "  Per conversation Jamesetta So states they would like to see a Dr at [facility]. Jamesetta So states she called and was told a referral would need to come from this office. She gave phone number of [phone] for contact.  This RN called above which was answered for a travel agency for ski vacations.  Reviewed above with MD who gave recommendation for Dr Mathis Bud at [facility]. Appropriate phone number obtained per internet and request inboxed to medical records.

## 2012-04-05 ENCOUNTER — Other Ambulatory Visit: Payer: Self-pay | Admitting: *Deleted

## 2012-04-06 ENCOUNTER — Emergency Department (HOSPITAL_COMMUNITY)
Admission: EM | Admit: 2012-04-06 | Discharge: 2012-04-07 | Disposition: A | Discharge status: Home / Self Care | Payer: Medicare Other | Attending: Emergency Medicine | Admitting: Emergency Medicine

## 2012-04-06 ENCOUNTER — Encounter (HOSPITAL_COMMUNITY): Payer: Self-pay | Admitting: *Deleted

## 2012-04-06 ENCOUNTER — Emergency Department (HOSPITAL_COMMUNITY): Payer: Medicare Other

## 2012-04-06 ENCOUNTER — Emergency Department (HOSPITAL_COMMUNITY)
Admission: EM | Admit: 2012-04-06 | Discharge: 2012-04-06 | Disposition: A | Discharge status: Home / Self Care | Payer: Medicare Other | Source: Home / Self Care | Attending: Emergency Medicine | Admitting: Emergency Medicine

## 2012-04-06 ENCOUNTER — Encounter (HOSPITAL_COMMUNITY): Payer: Self-pay

## 2012-04-06 DIAGNOSIS — R109 Unspecified abdominal pain: Secondary | ICD-10-CM | POA: Insufficient documentation

## 2012-04-06 DIAGNOSIS — R110 Nausea: Secondary | ICD-10-CM | POA: Insufficient documentation

## 2012-04-06 DIAGNOSIS — Z79899 Other long term (current) drug therapy: Secondary | ICD-10-CM | POA: Insufficient documentation

## 2012-04-06 DIAGNOSIS — I10 Essential (primary) hypertension: Secondary | ICD-10-CM | POA: Insufficient documentation

## 2012-04-06 DIAGNOSIS — C799 Secondary malignant neoplasm of unspecified site: Secondary | ICD-10-CM

## 2012-04-06 DIAGNOSIS — C50919 Malignant neoplasm of unspecified site of unspecified female breast: Secondary | ICD-10-CM | POA: Insufficient documentation

## 2012-04-06 DIAGNOSIS — R112 Nausea with vomiting, unspecified: Secondary | ICD-10-CM

## 2012-04-06 DIAGNOSIS — R1084 Generalized abdominal pain: Secondary | ICD-10-CM | POA: Insufficient documentation

## 2012-04-06 DIAGNOSIS — C7952 Secondary malignant neoplasm of bone marrow: Secondary | ICD-10-CM | POA: Insufficient documentation

## 2012-04-06 DIAGNOSIS — C7951 Secondary malignant neoplasm of bone: Secondary | ICD-10-CM | POA: Insufficient documentation

## 2012-04-06 DIAGNOSIS — Z87891 Personal history of nicotine dependence: Secondary | ICD-10-CM | POA: Insufficient documentation

## 2012-04-06 DIAGNOSIS — C801 Malignant (primary) neoplasm, unspecified: Secondary | ICD-10-CM | POA: Insufficient documentation

## 2012-04-06 LAB — URINALYSIS, ROUTINE W REFLEX MICROSCOPIC
Bilirubin Urine: NEGATIVE
Hgb urine dipstick: NEGATIVE
Nitrite: NEGATIVE
Protein, ur: 30 mg/dL — AB
Specific Gravity, Urine: 1.022 (ref 1.005–1.030)
pH: 6.5 (ref 5.0–8.0)
pH: 7 (ref 5.0–8.0)

## 2012-04-06 LAB — CBC WITH DIFFERENTIAL/PLATELET
Basophils Absolute: 0 10*3/uL (ref 0.0–0.1)
Basophils Absolute: 0 10*3/uL (ref 0.0–0.1)
Basophils Relative: 0 % (ref 0–1)
Basophils Relative: 0 % (ref 0–1)
Eosinophils Absolute: 0.1 10*3/uL (ref 0.0–0.7)
Lymphocytes Relative: 10 % — ABNORMAL LOW (ref 12–46)
Lymphs Abs: 0.9 10*3/uL (ref 0.7–4.0)
MCH: 25.5 pg — ABNORMAL LOW (ref 26.0–34.0)
MCHC: 33.4 g/dL (ref 30.0–36.0)
Neutro Abs: 5 10*3/uL (ref 1.7–7.7)
Neutrophils Relative %: 85 % — ABNORMAL HIGH (ref 43–77)
Neutrophils Relative %: 81 % — ABNORMAL HIGH (ref 43–77)
Platelets: 458 10*3/uL — ABNORMAL HIGH (ref 150–400)
RBC: 4.63 MIL/uL (ref 3.87–5.11)
RDW: 16.1 % — ABNORMAL HIGH (ref 11.5–15.5)
RDW: 15.9 % — ABNORMAL HIGH (ref 11.5–15.5)
WBC: 6.2 10*3/uL (ref 4.0–10.5)

## 2012-04-06 LAB — COMPREHENSIVE METABOLIC PANEL
ALT: 16 U/L (ref 0–35)
AST: 15 U/L (ref 0–37)
Albumin: 3.6 g/dL (ref 3.5–5.2)
Alkaline Phosphatase: 94 U/L (ref 39–117)
Potassium: 4.8 mEq/L (ref 3.5–5.1)
Sodium: 137 mEq/L (ref 135–145)
Total Protein: 8.3 g/dL (ref 6.0–8.3)

## 2012-04-06 LAB — URINE MICROSCOPIC-ADD ON

## 2012-04-06 MED ORDER — HYDROMORPHONE HCL PF 1 MG/ML IJ SOLN
1.0000 mg | Freq: Once | INTRAMUSCULAR | Status: AC
  Administered 2012-04-06: 1 mg via INTRAVENOUS
  Filled 2012-04-06: qty 1

## 2012-04-06 MED ORDER — ONDANSETRON HCL 4 MG/2ML IJ SOLN
4.0000 mg | Freq: Once | INTRAMUSCULAR | Status: AC
  Administered 2012-04-06: 4 mg via INTRAVENOUS
  Filled 2012-04-06: qty 2

## 2012-04-06 MED ORDER — ONDANSETRON HCL 4 MG/2ML IJ SOLN
INTRAMUSCULAR | Status: AC
  Administered 2012-04-06: 4 mg
  Filled 2012-04-06: qty 2

## 2012-04-06 MED ORDER — METOCLOPRAMIDE HCL 5 MG/ML IJ SOLN
10.0000 mg | Freq: Once | INTRAMUSCULAR | Status: AC
  Administered 2012-04-06: 10 mg via INTRAVENOUS
  Filled 2012-04-06: qty 2

## 2012-04-06 MED ORDER — SODIUM CHLORIDE 0.9 % IV SOLN
1000.0000 mL | INTRAVENOUS | Status: DC
  Administered 2012-04-07: 1000 mL via INTRAVENOUS

## 2012-04-06 MED ORDER — SODIUM CHLORIDE 0.9 % IV SOLN
1000.0000 mL | Freq: Once | INTRAVENOUS | Status: AC
  Administered 2012-04-06: 1000 mL via INTRAVENOUS

## 2012-04-06 NOTE — ED Provider Notes (Addendum)
History     CSN: [id number] Arrival date & time 04/06/12  2039 First MD Initiated Contact with Patient 04/06/12 2134    Chief Complaint  Patient presents with  . Abdominal Pain  . Nausea   HPI Pt presents with complaints of vomiting and nausea.  She was seen previously in the emergency department and released.  Pt states she is still feeling poorly.  Pt was released around 4 pm.  Pt returns to the ED with persistent pain and vomiting.  Patient states she had been given IV antinausea medications would seem to help however the oral medications at home do not. The pain is in upper abdomen. It does not seem to radiate. She has not had any fevers or chills. Patient does have breast cancer. She states her last treatment was about 2 weeks ago. The patient is very concerned about her persistent symptoms and feels that she may need to be admitted to the hospital. Past Medical History  Diagnosis Date  . Breast lump   . Thyroid disease   . Hypertension   . 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 Hx of radiation therapy 06/2010, 10/2009, 11/2011    L breast, T3-T12 spine, L-S spine  . Breast cancer 09/2009    L breast, ER+, PR-, Her 2 -    Past Surgical History  Procedure Date  . Knee arthroscopy     left  . Breast surgery     left lumpectomy    History reviewed. No pertinent family history.  History  Substance Use Topics  . Smoking status: Former Games developer  . Smokeless tobacco: Not on file  . Alcohol Use: No    OB History    Grav Para Term Preterm Abortions TAB SAB Ect Mult Living                  Review of Systems  All other systems reviewed and are negative.    Allergies  Review of patient's allergies indicates no known allergies.  Home Medications   Current Outpatient Rx  Name Route Sig Dispense Refill  . FLUCONAZOLE 100 MG PO TABS Oral Take 100 mg by mouth daily.    Marland Kitchen GABAPENTIN 100 MG PO CAPS Oral Take 100 mg by mouth 3 (three) times daily.    Marland Kitchen GABAPENTIN 300 MG PO CAPS  Oral Take 1 capsule (300 mg total) by mouth 4 (four) times daily as needed. 120 capsule 6  . LEVOTHYROXINE SODIUM 150 MCG PO TABS Oral Take 150 mcg by mouth daily.    Marland Kitchen LOPERAMIDE HCL 2 MG PO CAPS Oral Take 2 mg by mouth 4 (four) times daily as needed.    Marland Kitchen LORAZEPAM 0.5 MG PO TABS Oral Take 0.5 mg by mouth 2 (two) times daily as needed. For anxiety    . METOCLOPRAMIDE HCL 10 MG PO TABS Oral Take 1 tablet (10 mg total) by mouth 3 (three) times daily as needed. 90 tablet 3  . ONDANSETRON HCL 8 MG PO TABS Oral Take 8 mg by mouth every 8 (eight) hours as needed. Nausea    . OXYCODONE-ACETAMINOPHEN 5-325 MG PO TABS Oral Take 1-2 tablets by mouth every 6 (six) hours as needed for pain. 20 tablet 0  . PROMETHAZINE HCL 25 MG PO TABS Oral Take 25 mg by mouth every 6 (six) hours as needed. Nausea    . TAMOXIFEN CITRATE 20 MG PO TABS Oral Take 20 mg by mouth daily.    . TRAMADOL HCL  50 MG PO TABS Oral Take 1 tablet (50 mg total) by mouth every 6 (six) hours as needed. For pain 120 tablet 0  . VENLAFAXINE HCL ER 75 MG PO CP24 Oral Take 2 capsules (150 mg total) by mouth daily. 30 capsule 12    BP 178/93  Pulse 83  Temp 98.2 F (36.8 C) (Oral)  SpO2 100%  Physical Exam  Nursing note and vitals reviewed. Constitutional: She appears well-developed and well-nourished. No distress.  HENT:  Head: Normocephalic and atraumatic.  Right Ear: External ear normal.  Left Ear: External ear normal.  Eyes: Conjunctivae are normal. Right eye exhibits no discharge. Left eye exhibits no discharge. No scleral icterus.  Neck: Neck supple. No tracheal deviation present.  Cardiovascular: Normal rate, regular rhythm and intact distal pulses.   Pulmonary/Chest: Effort normal and breath sounds normal. No stridor. No respiratory distress. She has no wheezes. She has no rales.  Abdominal: Soft. Bowel sounds are normal. She exhibits no distension. There is tenderness. There is no rebound and no guarding.  Musculoskeletal:  She exhibits no edema and no tenderness.  Neurological: She is alert. She has normal strength. No sensory deficit. Cranial nerve deficit:  no gross defecits noted. She exhibits normal muscle tone. She displays no seizure activity. Coordination normal.  Skin: Skin is warm and dry. No rash noted.  Psychiatric: She has a normal mood and affect.    ED Course  Procedures (including critical care time)  Labs Reviewed  CBC WITH DIFFERENTIAL - Abnormal; Notable for the following:    Hemoglobin 11.7 (*)     HCT 35.0 (*)     MCV 77.6 (*)     MCH 25.9 (*)     RDW 15.9 (*)     Platelets 425 (*)     Neutrophils Relative 81 (*)     Lymphocytes Relative 10 (*)     Lymphs Abs 0.6 (*)     All other components within normal limits  COMPREHENSIVE METABOLIC PANEL  LIPASE, BLOOD  URINALYSIS, ROUTINE W REFLEX MICROSCOPIC   No results found.    MDM  The patient returns to the emergency room after being seen earlier today with persistent pain and nausea. She has history of trouble with nausea possibly related to her breast CA and treatments.  Will continue IV fluids and medications for pain and nausea. I will proceed with CT scan and pelvis to evaluate for any acute abdominal pathology.  Case will be turned over to Dr Hyacinth Meeker to disposition accordingly following the CT scan.  Celene Kras, MD 04/06/12 [phone]

## 2012-04-06 NOTE — ED Notes (Signed)
Per EMS report; pt from home: Woke up about 1 hour before EMS arrival w/ nausea and vomiting with intermittent abd pain.  No longer c/o of abd pain. 4mg  IV zofran but still complaining of nausea.  Breast cancer pt but not currently receiving radiation or chemo. Has had hx of n/v but w/out a dx. 20g L AC.

## 2012-04-06 NOTE — ED Provider Notes (Signed)
History     CSN: [id number]  Arrival date & time 04/06/12  0602   First MD Initiated Contact with Patient 04/06/12 [phone]      Chief Complaint  Patient presents with  . Nausea  . Emesis    (Consider location/radiation/quality/duration/timing/severity/associated sxs/prior treatment) HPI Comments: 46 y/o female presents by ambulance with sudden onset abdominal pain 1 hour ago waking her up from sleep. States the pain is "all around her abdomen", described as sharp and stabbing rated 10/10. Associated symptoms include nausea and vomiting. Denies any low back pain. Denies fever, chills, chest pain, sob, urinary or bowel complaints/changes. Denies eating anything out of the normal yesterday. Has a history of breast cancer. She is currently not going through chemo or radiation. Zofran was given by EMS without any relief of her nausea.   Patient is a 46 y.o. female presenting with vomiting. The history is provided by the patient and a parent.  Emesis  Associated symptoms include abdominal pain. Pertinent negatives include no chills, no fever and no headaches.    Past Medical History  Diagnosis Date  . Breast lump   . Thyroid disease   . Hypertension   . 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 Hx of radiation therapy 06/2010, 10/2009, 11/2011    L breast, T3-T12 spine, L-S spine  . Breast cancer 09/2009    L breast, ER+, PR-, Her 2 -    Past Surgical History  Procedure Date  . Knee arthroscopy     left  . Breast surgery     left lumpectomy    No family history on file.  History  Substance Use Topics  . Smoking status: Former Games developer  . Smokeless tobacco: Not on file  . Alcohol Use: No    OB History    Grav Para Term Preterm Abortions TAB SAB Ect Mult Living                  Review of Systems  Constitutional: Negative for fever, chills and appetite change.  Respiratory: Negative for shortness of breath.   Cardiovascular: Negative for chest pain.  Gastrointestinal: Positive  for nausea, vomiting and abdominal pain.  Genitourinary: Negative for dysuria, vaginal bleeding and difficulty urinating.  Musculoskeletal: Negative for back pain.  Skin: Negative for rash.  Neurological: Negative for dizziness, light-headedness and headaches.  Psychiatric/Behavioral: Negative for confusion.    Allergies  Review of patient's allergies indicates no known allergies.  Home Medications   Current Outpatient Rx  Name Route Sig Dispense Refill  . FLUCONAZOLE 100 MG PO TABS Oral Take 100 mg by mouth daily.    Marland Kitchen GABAPENTIN 100 MG PO CAPS Oral Take 100 mg by mouth 3 (three) times daily.    Marland Kitchen GABAPENTIN 300 MG PO CAPS Oral Take 1 capsule (300 mg total) by mouth 4 (four) times daily as needed. 120 capsule 6  . LEVOTHYROXINE SODIUM 150 MCG PO TABS Oral Take 150 mcg by mouth daily.    Marland Kitchen LOPERAMIDE HCL 2 MG PO CAPS Oral Take 2 mg by mouth 4 (four) times daily as needed.    Marland Kitchen LORAZEPAM 0.5 MG PO TABS Oral Take 0.5 mg by mouth 2 (two) times daily as needed. For anxiety    . METOCLOPRAMIDE HCL 10 MG PO TABS Oral Take 1 tablet (10 mg total) by mouth 3 (three) times daily as needed. 90 tablet 3  . ONDANSETRON HCL 8 MG PO TABS Oral Take 8 mg by mouth every 8 (  eight) hours as needed. Nausea    . OXYCODONE-ACETAMINOPHEN 5-325 MG PO TABS Oral Take 1-2 tablets by mouth every 6 (six) hours as needed for pain. 20 tablet 0  . PROMETHAZINE HCL 25 MG PO TABS Oral Take 25 mg by mouth every 6 (six) hours as needed. Nausea    . TAMOXIFEN CITRATE 20 MG PO TABS Oral Take 20 mg by mouth daily.    . TRAMADOL HCL 50 MG PO TABS Oral Take 1 tablet (50 mg total) by mouth every 6 (six) hours as needed. For pain 120 tablet 0  . VENLAFAXINE HCL ER 75 MG PO CP24 Oral Take 2 capsules (150 mg total) by mouth daily. 30 capsule 12    BP 193/101  Pulse 72  Temp 98.2 F (36.8 C) (Oral)  Resp 20  SpO2 96%  Physical Exam  Constitutional: She is oriented to person, place, and time. She appears well-developed and  well-nourished. She appears distressed.  HENT:  Head: Normocephalic and atraumatic.  Mouth/Throat: Oropharynx is clear and moist.  Eyes: Conjunctivae and EOM are normal.  Neck: Normal range of motion. Neck supple.  Cardiovascular: Normal rate, regular rhythm and normal heart sounds.   Pulmonary/Chest: Effort normal and breath sounds normal.  Abdominal: Soft. Bowel sounds are normal. She exhibits no mass. There is generalized tenderness. There is guarding. There is no rigidity, no rebound and no CVA tenderness.  Musculoskeletal: Normal range of motion. She exhibits no edema.  Neurological: She is alert and oriented to person, place, and time.  Skin: Skin is warm and dry. No rash noted. She is not diaphoretic. No pallor.  Psychiatric: Her speech is normal. Her mood appears anxious. She is agitated.    ED Course  Procedures (including critical care time)   Labs Reviewed  CBC WITH DIFFERENTIAL  COMPREHENSIVE METABOLIC PANEL  LIPASE, BLOOD   Results for orders placed during the hospital encounter of 04/06/12  CBC WITH DIFFERENTIAL      Component Value Range   WBC 8.7  4.0 - 10.5 K/uL   RBC 4.63  3.87 - 5.11 MIL/uL   Hemoglobin 11.8 (*) 12.0 - 15.0 g/dL   HCT 16.1  09.6 - 04.5 %   MCV 78.0  78.0 - 100.0 fL   MCH 25.5 (*) 26.0 - 34.0 pg   MCHC 32.7  30.0 - 36.0 g/dL   RDW 40.9 (*) 81.1 - 91.4 %   Platelets 458 (*) 150 - 400 K/uL   Neutrophils Relative 85 (*) 43 - 77 %   Neutro Abs 7.4  1.7 - 7.7 K/uL   Lymphocytes Relative 11 (*) 12 - 46 %   Lymphs Abs 0.9  0.7 - 4.0 K/uL   Monocytes Relative 3  3 - 12 %   Monocytes Absolute 0.3  0.1 - 1.0 K/uL   Eosinophils Relative 2  0 - 5 %   Eosinophils Absolute 0.1  0.0 - 0.7 K/uL   Basophils Relative 0  0 - 1 %   Basophils Absolute 0.0  0.0 - 0.1 K/uL  COMPREHENSIVE METABOLIC PANEL      Component Value Range   Sodium 137  135 - 145 mEq/L   Potassium 4.8  3.5 - 5.1 mEq/L   Chloride 100  96 - 112 mEq/L   CO2 31  19 - 32 mEq/L    Glucose, Bld 169 (*) 70 - 99 mg/dL   BUN 14  6 - 23 mg/dL   Creatinine, Ser 7.82  0.50 - 1.10 mg/dL  Calcium 10.7 (*) 8.4 - 10.5 mg/dL   Total Protein 8.3  6.0 - 8.3 g/dL   Albumin 3.6  3.5 - 5.2 g/dL   AST 15  0 - 37 U/L   ALT 16  0 - 35 U/L   Alkaline Phosphatase 94  39 - 117 U/L   Total Bilirubin 0.3  0.3 - 1.2 mg/dL   GFR calc non Af Amer 79 (*) >90 mL/min   GFR calc Af Amer >90  >90 mL/min  LIPASE, BLOOD      Component Value Range   Lipase 14  11 - 59 U/L  URINALYSIS, ROUTINE W REFLEX MICROSCOPIC      Component Value Range   Color, Urine YELLOW  YELLOW   APPearance CLOUDY (*) CLEAR   Specific Gravity, Urine 1.022  1.005 - 1.030   pH 6.5  5.0 - 8.0   Glucose, UA 100 (*) NEGATIVE mg/dL   Hgb urine dipstick NEGATIVE  NEGATIVE   Bilirubin Urine NEGATIVE  NEGATIVE   Ketones, ur TRACE (*) NEGATIVE mg/dL   Protein, ur NEGATIVE  NEGATIVE mg/dL   Urobilinogen, UA 1.0  0.0 - 1.0 mg/dL   Nitrite NEGATIVE  NEGATIVE   Leukocytes, UA NEGATIVE  NEGATIVE    No results found.   1. Abdominal pain   2. Nausea & vomiting       MDM  46 y/o female with abdominal pain, nausea, vomiting. Told EMS zofran made her abdominal pain decrease, denied this when being asked in room. Diffuse tenderness to palpation of entire abdomen with guarding. No guarding present while listening to bowel sounds. Will obtain labs, manage pain/nausea, and re-assess. 7:33 AM Patient states pain down to 4/10. Abdomen still diffusely tender, but without guarding. Denies any nausea. Awaiting urine results.  8:25 AM Patient denying any pain. Abdomen non tender. No nausea or vomiting. Patient has nausea medication from her PCP. No red flags concerning need for CT scan. Patient is in NAD. Advised f/u with PCP within the next week.      Trevor Mace, PA-C 04/06/12 [phone]

## 2012-04-06 NOTE — ED Notes (Signed)
Per [facility], patient was seen here earlier and was discharged around 4pm. Presents now for re-evaluation for nausea, vomiting, and abdominal. Rec'd Rx for Promethazine and Reglan.

## 2012-04-06 NOTE — ED Notes (Signed)
SW paged, pt requires bus pass and also wants to speak to SW.

## 2012-04-06 NOTE — ED Provider Notes (Signed)
Medical screening examination/treatment/procedure(s) were performed by non-physician practitioner and as supervising physician I was immediately available for consultation/collaboration.    Vida Roller, MD 04/06/12 [phone]

## 2012-04-06 NOTE — ED Notes (Signed)
[facility] called to transport pt to home with mother to ride withpt.

## 2012-04-06 NOTE — ED Notes (Signed)
RN offered pt bus pass, pt states she needs one for her family as well. RN instructed pt that we are able to provide bus pass for her but not for family. Pt states she only has on her pajamas. RN offered blue scrubs but pt refused.

## 2012-04-07 ENCOUNTER — Encounter (HOSPITAL_COMMUNITY): Payer: Self-pay | Admitting: Radiology

## 2012-04-07 LAB — COMPREHENSIVE METABOLIC PANEL
AST: 15 U/L (ref 0–37)
Albumin: 3.4 g/dL — ABNORMAL LOW (ref 3.5–5.2)
BUN: 10 mg/dL (ref 6–23)
Calcium: 10.2 mg/dL (ref 8.4–10.5)
Chloride: 96 mEq/L (ref 96–112)
Creatinine, Ser: 0.82 mg/dL (ref 0.50–1.10)
GFR calc non Af Amer: 85 mL/min — ABNORMAL LOW (ref >90)
Total Bilirubin: 0.3 mg/dL (ref 0.3–1.2)

## 2012-04-07 LAB — LIPASE, BLOOD: Lipase: 12 U/L (ref 11–59)

## 2012-04-07 MED ORDER — PROMETHAZINE HCL 25 MG PO TABS: 25.0000 mg | ORAL_TABLET | Freq: Four times a day (QID) | ORAL | Status: AC | PRN

## 2012-04-07 MED ORDER — HYDROMORPHONE HCL 2 MG PO TABS: 2.0000 mg | ORAL_TABLET | ORAL | Status: DC | PRN

## 2012-04-07 MED ORDER — IOHEXOL 300 MG/ML  SOLN
100.0000 mL | Freq: Once | INTRAMUSCULAR | Status: AC | PRN
  Administered 2012-04-07: 100 mL via INTRAVENOUS

## 2012-04-07 MED ORDER — ONDANSETRON 4 MG PO TBDP: 4.0000 mg | ORAL_TABLET | Freq: Three times a day (TID) | ORAL | Status: AC | PRN

## 2012-04-07 MED ORDER — HYDROMORPHONE HCL PF 2 MG/ML IJ SOLN
4.0000 mg | Freq: Once | INTRAMUSCULAR | Status: DC
  Filled 2012-04-07: qty 2

## 2012-04-07 MED ORDER — ONDANSETRON HCL 4 MG/2ML IJ SOLN
4.0000 mg | Freq: Once | INTRAMUSCULAR | Status: AC
  Administered 2012-04-07: 4 mg via INTRAVENOUS
  Filled 2012-04-07: qty 2

## 2012-04-07 MED ORDER — HYDROMORPHONE HCL PF 1 MG/ML IJ SOLN
1.0000 mg | Freq: Once | INTRAMUSCULAR | Status: AC
  Administered 2012-04-07: 1 mg via INTRAVENOUS
  Filled 2012-04-07: qty 1

## 2012-04-07 NOTE — ED Notes (Signed)
Informed pt that by 5:30am if pt has no ride, we will move pt to the waiting room to wait for her ride.

## 2012-04-07 NOTE — ED Provider Notes (Signed)
  Physical Exam  BP 136/84  Pulse 66  Temp 97.8 F (36.6 C) (Oral)  Resp 18  SpO2 97%  Physical Exam  ED Course  Procedures  MDM Patient reexamined after CT scan results are back, I have informed her of her spread of the cancer, there does not appear to be any other acute process in the abdomen, laboratory data reviewed and unremarkable, vital signs but no fever, tachycardia, hypotension or hypoxia. The patient is improved medications, discharge with oral hydromorphone and Phenergan and Zofran. Patient is in agreement with the plan.      Vida Roller, MD 04/07/12 [phone]

## 2012-04-07 NOTE — ED Notes (Signed)
Called to see if [facility] would take pt. Home.  They are unable to send anyone to take pt. Home.

## 2012-04-07 NOTE — ED Notes (Signed)
Pt at CT

## 2012-04-12 ENCOUNTER — Other Ambulatory Visit (HOSPITAL_BASED_OUTPATIENT_CLINIC_OR_DEPARTMENT_OTHER): Payer: Medicare Other | Admitting: Lab

## 2012-04-12 ENCOUNTER — Telehealth: Payer: Self-pay | Admitting: *Deleted

## 2012-04-12 ENCOUNTER — Ambulatory Visit (HOSPITAL_BASED_OUTPATIENT_CLINIC_OR_DEPARTMENT_OTHER): Payer: Medicare Other

## 2012-04-12 VITALS — BP 131/90 | HR 108 | Temp 98.1°F | Resp 20

## 2012-04-12 DIAGNOSIS — Z5111 Encounter for antineoplastic chemotherapy: Secondary | ICD-10-CM

## 2012-04-12 DIAGNOSIS — C50919 Malignant neoplasm of unspecified site of unspecified female breast: Secondary | ICD-10-CM

## 2012-04-12 DIAGNOSIS — C3430 Malignant neoplasm of lower lobe, unspecified bronchus or lung: Secondary | ICD-10-CM

## 2012-04-12 DIAGNOSIS — C7952 Secondary malignant neoplasm of bone marrow: Secondary | ICD-10-CM

## 2012-04-12 DIAGNOSIS — F329 Major depressive disorder, single episode, unspecified: Secondary | ICD-10-CM

## 2012-04-12 DIAGNOSIS — F410 Panic disorder [episodic paroxysmal anxiety] without agoraphobia: Secondary | ICD-10-CM

## 2012-04-12 DIAGNOSIS — C801 Malignant (primary) neoplasm, unspecified: Secondary | ICD-10-CM

## 2012-04-12 LAB — CBC WITH DIFFERENTIAL/PLATELET
Basophils Absolute: 0 10*3/uL (ref 0.0–0.1)
Eosinophils Absolute: 0.4 10*3/uL (ref 0.0–0.5)
HCT: 32.2 % — ABNORMAL LOW (ref 34.8–46.6)
HGB: 10.5 g/dL — ABNORMAL LOW (ref 11.6–15.9)
MCV: 78.2 fL — ABNORMAL LOW (ref 79.5–101.0)
MONO%: 5.5 % (ref 0.0–14.0)
NEUT#: 5.8 10*3/uL (ref 1.5–6.5)
RDW: 16.4 % — ABNORMAL HIGH (ref 11.2–14.5)

## 2012-04-12 LAB — COMPREHENSIVE METABOLIC PANEL (CC13)
BUN: 20 mg/dL (ref 7.0–26.0)
CO2: 28 mEq/L (ref 22–29)
Creatinine: 1.1 mg/dL (ref 0.6–1.1)
Glucose: 82 mg/dl (ref 70–99)
Total Bilirubin: 0.2 mg/dL (ref 0.20–1.20)
Total Protein: 6.8 g/dL (ref 6.4–8.3)

## 2012-04-12 MED ORDER — FULVESTRANT 250 MG/5ML IM SOLN
500.0000 mg | Freq: Once | INTRAMUSCULAR | Status: AC
  Administered 2012-04-12: 500 mg via INTRAMUSCULAR
  Filled 2012-04-12: qty 10

## 2012-04-12 MED ORDER — HEPARIN SOD (PORK) LOCK FLUSH 100 UNIT/ML IV SOLN
500.0000 [IU] | Freq: Once | INTRAVENOUS | Status: DC | PRN
  Filled 2012-04-12: qty 5

## 2012-04-12 MED ORDER — SODIUM CHLORIDE 0.9 % IJ SOLN
10.0000 mL | INTRAMUSCULAR | Status: DC | PRN
  Filled 2012-04-12: qty 10

## 2012-04-12 MED ORDER — HEPARIN SOD (PORK) LOCK FLUSH 100 UNIT/ML IV SOLN
250.0000 [IU] | Freq: Once | INTRAVENOUS | Status: DC | PRN
  Filled 2012-04-12: qty 5

## 2012-04-12 MED ORDER — ZOLEDRONIC ACID 4 MG/5ML IV CONC
4.0000 mg | Freq: Once | INTRAVENOUS | Status: AC
  Administered 2012-04-12: 4 mg via INTRAVENOUS
  Filled 2012-04-12: qty 5

## 2012-04-12 MED ORDER — LORAZEPAM 1 MG PO TABS
0.5000 mg | ORAL_TABLET | Freq: Once | ORAL | Status: AC
  Administered 2012-04-12: 0.5 mg via ORAL

## 2012-04-12 MED ORDER — SODIUM CHLORIDE 0.9 % IJ SOLN
3.0000 mL | Freq: Once | INTRAMUSCULAR | Status: DC | PRN
  Filled 2012-04-12: qty 10

## 2012-04-12 MED ORDER — ALTEPLASE 2 MG IJ SOLR
2.0000 mg | Freq: Once | INTRAMUSCULAR | Status: DC | PRN
  Filled 2012-04-12: qty 2

## 2012-04-12 NOTE — Patient Instructions (Addendum)
Fulvestrant injection What is this medicine? FULVESTRANT (ful VES trant) blocks the effects of estrogen. It is used to treat breast cancer in women past the age of menopause. This medicine may be used for other purposes; ask your health care provider or pharmacist if you have questions. What should I tell my health care provider before I take this medicine? They need to know if you have any of these conditions: -bleeding problems -liver disease -low levels of platelets in the blood -an unusual or allergic reaction to fulvestrant, other medicines, foods, dyes, or preservatives -pregnant or trying to get pregnant -breast-feeding How should I use this medicine? This medicine is for injection into a muscle. It is usually given by a health care professional in a hospital or clinic setting. Talk to your pediatrician regarding the use of this medicine in children. Special care may be needed. Overdosage: If you think you have taken too much of this medicine contact a poison control center or emergency room at once. NOTE: This medicine is only for you. Do not share this medicine with others. What if I miss a dose? It is important not to miss your dose. Call your doctor or health care professional if you are unable to keep an appointment. What may interact with this medicine? -medicines that treat or prevent blood clots like warfarin, enoxaparin, and dalteparin This list may not describe all possible interactions. Give your health care provider a list of all the medicines, herbs, non-prescription drugs, or dietary supplements you use. Also tell them if you smoke, drink alcohol, or use illegal drugs. Some items may interact with your medicine. What should I watch for while using this medicine? Your condition will be monitored carefully while you are receiving this medicine. You will need important blood work done while you are taking this medicine. Do not become pregnant while taking this medicine.  Women should inform their doctor if they wish to become pregnant or think they might be pregnant. There is a potential for serious side effects to an unborn child. Talk to your health care professional or pharmacist for more information. What side effects may I notice from receiving this medicine? Side effects that you should report to your doctor or health care professional as soon as possible: -allergic reactions like skin rash, itching or hives, swelling of the face, lips, or tongue -feeling faint or lightheaded, falls -fever or flu-like symptoms -sore throat -vaginal bleeding Side effects that usually do not require medical attention (report to your doctor or health care professional if they continue or are bothersome): -aches, pains -constipation or diarrhea -headache -hot flashes -nausea, vomiting -pain at site where injected -stomach pain This list may not describe all possible side effects. Call your doctor for medical advice about side effects. You may report side effects to FDA at 1-800-FDA-1088. Where should I keep my medicine? This drug is given in a hospital or clinic and will not be stored at home. NOTE: This sheet is a summary. It may not cover all possible information. If you have questions about this medicine, talk to your doctor, pharmacist, or health care provider.  2012, Elsevier/Gold Standard. (12/04/2007 3:39:24 PM)      Zoledronic Acid injection (Hypercalcemia, Oncology) What is this medicine? ZOLEDRONIC ACID (ZOE le dron ik AS id) lowers the amount of calcium loss from bone. It is used to treat too much calcium in your blood from cancer. It is also used to prevent complications of cancer that has spread to the bone. This  medicine may be used for other purposes; ask your health care provider or pharmacist if you have questions. What should I tell my health care provider before I take this medicine? They need to know if you have any of these  conditions: -aspirin-sensitive asthma -dental disease -kidney disease -an unusual or allergic reaction to zoledronic acid, other medicines, foods, dyes, or preservatives -pregnant or trying to get pregnant -breast-feeding How should I use this medicine? This medicine is for infusion into a vein. It is given by a health care professional in a hospital or clinic setting. Talk to your pediatrician regarding the use of this medicine in children. Special care may be needed. Overdosage: If you think you have taken too much of this medicine contact a poison control center or emergency room at once. NOTE: This medicine is only for you. Do not share this medicine with others. What if I miss a dose? It is important not to miss your dose. Call your doctor or health care professional if you are unable to keep an appointment. What may interact with this medicine? -certain antibiotics given by injection -NSAIDs, medicines for pain and inflammation, like ibuprofen or naproxen -some diuretics like bumetanide, furosemide -teriparatide -thalidomide This list may not describe all possible interactions. Give your health care provider a list of all the medicines, herbs, non-prescription drugs, or dietary supplements you use. Also tell them if you smoke, drink alcohol, or use illegal drugs. Some items may interact with your medicine. What should I watch for while using this medicine? Visit your doctor or health care professional for regular checkups. It may be some time before you see the benefit from this medicine. Do not stop taking your medicine unless your doctor tells you to. Your doctor may order blood tests or other tests to see how you are doing. Women should inform their doctor if they wish to become pregnant or think they might be pregnant. There is a potential for serious side effects to an unborn child. Talk to your health care professional or pharmacist for more information. You should make sure that  you get enough calcium and vitamin D while you are taking this medicine. Discuss the foods you eat and the vitamins you take with your health care professional. Some people who take this medicine have severe bone, joint, and/or muscle pain. This medicine may also increase your risk for a broken thigh bone. Tell your doctor right away if you have pain in your upper leg or groin. Tell your doctor if you have any pain that does not go away or that gets worse. What side effects may I notice from receiving this medicine? Side effects that you should report to your doctor or health care professional as soon as possible: -allergic reactions like skin rash, itching or hives, swelling of the face, lips, or tongue -anxiety, confusion, or depression -breathing problems -changes in vision -feeling faint or lightheaded, falls -jaw burning, cramping, pain -muscle cramps, stiffness, or weakness -trouble passing urine or change in the amount of urine Side effects that usually do not require medical attention (report to your doctor or health care professional if they continue or are bothersome): -bone, joint, or muscle pain -fever -hair loss -irritation at site where injected -loss of appetite -nausea, vomiting -stomach upset -tired This list may not describe all possible side effects. Call your doctor for medical advice about side effects. You may report side effects to FDA at 1-800-FDA-1088. Where should I keep my medicine? This drug is given  in a hospital or clinic and will not be stored at home. NOTE: This sheet is a summary. It may not cover all possible information. If you have questions about this medicine, talk to your doctor, pharmacist, or health care provider.  2012, Elsevier/Gold Standard. (01/22/2011 9:06:58 AM)   Call [facility] [phone] for any questions, concerns or new problems prior to next visit. Ask for Dr. Darrall Dears nurse during regular office hours or on call MD after hours if  urgent.

## 2012-04-12 NOTE — Telephone Encounter (Signed)
Mom called asking for refill on avelox and promethazine.  Just left aftertreatment and was told this would be called to Pharmacy.  Collaborative nurse notified.

## 2012-04-17 DIAGNOSIS — I10 Essential (primary) hypertension: Secondary | ICD-10-CM | POA: Insufficient documentation

## 2012-04-17 DIAGNOSIS — Z853 Personal history of malignant neoplasm of breast: Secondary | ICD-10-CM | POA: Insufficient documentation

## 2012-04-17 DIAGNOSIS — Z85830 Personal history of malignant neoplasm of bone: Secondary | ICD-10-CM | POA: Insufficient documentation

## 2012-04-17 DIAGNOSIS — R112 Nausea with vomiting, unspecified: Secondary | ICD-10-CM | POA: Insufficient documentation

## 2012-04-17 DIAGNOSIS — E079 Disorder of thyroid, unspecified: Secondary | ICD-10-CM | POA: Insufficient documentation

## 2012-04-17 DIAGNOSIS — Z87891 Personal history of nicotine dependence: Secondary | ICD-10-CM | POA: Insufficient documentation

## 2012-04-17 DIAGNOSIS — R109 Unspecified abdominal pain: Secondary | ICD-10-CM | POA: Insufficient documentation

## 2012-04-17 MED ORDER — ONDANSETRON 4 MG PO TBDP
8.0000 mg | ORAL_TABLET | Freq: Once | ORAL | Status: AC
  Administered 2012-04-18: 8 mg via ORAL
  Filled 2012-04-17: qty 2

## 2012-04-18 ENCOUNTER — Ambulatory Visit: Payer: Self-pay

## 2012-04-18 ENCOUNTER — Emergency Department (HOSPITAL_COMMUNITY)
Admission: EM | Admit: 2012-04-18 | Discharge: 2012-04-18 | Disposition: A | Discharge status: Home / Self Care | Payer: Medicare Other | Attending: Emergency Medicine | Admitting: Emergency Medicine

## 2012-04-18 ENCOUNTER — Telehealth: Payer: Self-pay | Admitting: Oncology

## 2012-04-18 ENCOUNTER — Encounter (HOSPITAL_COMMUNITY): Payer: Self-pay | Admitting: *Deleted

## 2012-04-18 DIAGNOSIS — R109 Unspecified abdominal pain: Secondary | ICD-10-CM

## 2012-04-18 DIAGNOSIS — R112 Nausea with vomiting, unspecified: Secondary | ICD-10-CM

## 2012-04-18 LAB — URINALYSIS, ROUTINE W REFLEX MICROSCOPIC
Nitrite: NEGATIVE
Specific Gravity, Urine: 1.014 (ref 1.005–1.030)
Urobilinogen, UA: 1 mg/dL (ref 0.0–1.0)

## 2012-04-18 LAB — URINE MICROSCOPIC-ADD ON

## 2012-04-18 LAB — CBC WITH DIFFERENTIAL/PLATELET
Basophils Absolute: 0 10*3/uL (ref 0.0–0.1)
Basophils Relative: 0 % (ref 0–1)
MCHC: 33.1 g/dL (ref 30.0–36.0)
Neutro Abs: 6.5 10*3/uL (ref 1.7–7.7)
Neutrophils Relative %: 85 % — ABNORMAL HIGH (ref 43–77)
RDW: 16.5 % — ABNORMAL HIGH (ref 11.5–15.5)

## 2012-04-18 LAB — COMPREHENSIVE METABOLIC PANEL
ALT: 13 U/L (ref 0–35)
Alkaline Phosphatase: 84 U/L (ref 39–117)
CO2: 27 mEq/L (ref 19–32)
Calcium: 9.9 mg/dL (ref 8.4–10.5)
GFR calc Af Amer: >90 mL/min (ref >90)
GFR calc non Af Amer: >90 mL/min (ref >90)
Glucose, Bld: 169 mg/dL — ABNORMAL HIGH (ref 70–99)
Potassium: 3.6 mEq/L (ref 3.5–5.1)
Sodium: 139 mEq/L (ref 135–145)

## 2012-04-18 LAB — POCT PREGNANCY, URINE: Preg Test, Ur: NEGATIVE

## 2012-04-18 MED ORDER — ONDANSETRON 4 MG PO TBDP: 4.0000 mg | ORAL_TABLET | Freq: Three times a day (TID) | ORAL | Status: DC | PRN

## 2012-04-18 MED ORDER — PROMETHAZINE HCL 25 MG PO TABS: 25.0000 mg | ORAL_TABLET | Freq: Four times a day (QID) | ORAL | Status: DC | PRN

## 2012-04-18 MED ORDER — ONDANSETRON HCL 4 MG/2ML IJ SOLN
4.0000 mg | Freq: Once | INTRAMUSCULAR | Status: AC
  Administered 2012-04-18: 4 mg via INTRAVENOUS
  Filled 2012-04-18: qty 2

## 2012-04-18 MED ORDER — SODIUM CHLORIDE 0.9 % IV BOLUS (SEPSIS)
1000.0000 mL | Freq: Once | INTRAVENOUS | Status: AC
  Administered 2012-04-18: 1000 mL via INTRAVENOUS

## 2012-04-18 MED ORDER — OXYCODONE-ACETAMINOPHEN 5-325 MG PO TABS: 2.0000 | ORAL_TABLET | ORAL | Status: DC | PRN

## 2012-04-18 MED ORDER — HYDROMORPHONE HCL PF 1 MG/ML IJ SOLN
1.0000 mg | Freq: Once | INTRAMUSCULAR | Status: AC
  Administered 2012-04-18: 1 mg via INTRAVENOUS
  Filled 2012-04-18: qty 1

## 2012-04-18 NOTE — ED Notes (Signed)
Spit zofran out

## 2012-04-18 NOTE — ED Notes (Signed)
Patient presents while vomiting.  Has a history of stage 4 breast cancer with chemo monthly.  C/o entire abd tender to touch.  Yelling out loud with pain.

## 2012-04-18 NOTE — Telephone Encounter (Signed)
Pt has appt. With Dr. Bebe Liter at [facility] on 04/26/12 @ 8:30 pt is aware.

## 2012-04-18 NOTE — ED Notes (Signed)
C/o abd pain, also nv, (denies: fever,diarrhea, back pain, bleeding, vaginal or urinary sx), sudden onset today. H/o stage 4 breast CA, taking chemo.

## 2012-04-18 NOTE — ED Provider Notes (Signed)
History     CSN: [id number]  Arrival date & time 04/17/12  2348   First MD Initiated Contact with Patient 04/18/12 0007      Chief Complaint  Patient presents with  . Abdominal Pain  . Emesis    (Consider location/radiation/quality/duration/timing/severity/associated sxs/prior treatment) HPI Comments: 46 year old female with a history of metastatic stage IV breast cancer with spread to the bones, possible abdominal spread. She presents with a complaint of acute onset of nausea vomiting and abdominal pain which started sudden onset several hours ago, has been persistent and is not associated with diarrhea, rectal or vaginal bleeding, shortness of breath, headache or rashes. She was seen recently in the last 10 days for similar symptoms and had a CT scan of the abdomen which did not show an acute etiology for her symptoms and states that the last 10 days have been symptom-free.  Patient is a 46 y.o. female presenting with abdominal pain and vomiting. The history is provided by the patient and medical records.  Abdominal Pain The primary symptoms of the illness include abdominal pain and vomiting.  Emesis  Associated symptoms include abdominal pain.    Past Medical History  Diagnosis Date  . Breast lump   . Thyroid disease   . Hypertension   . Hx of radiation therapy 06/2010, 10/2009, 11/2011    L breast, T3-T12 spine, L-S spine  . 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 Breast cancer 09/2009    L breast, ER+, PR-, Her 2 -    Past Surgical History  Procedure Date  . Knee arthroscopy     left  . Breast surgery     left lumpectomy    No family history on file.  History  Substance Use Topics  . Smoking status: Former Games developer  . Smokeless tobacco: Not on file  . Alcohol Use: No    OB History    Grav Para Term Preterm Abortions TAB SAB Ect Mult Living                  Review of Systems  Gastrointestinal: Positive for vomiting and abdominal pain.  All other systems  reviewed and are negative.    Allergies  Review of patient's allergies indicates no known allergies.  Home Medications   Current Outpatient Rx  Name Route Sig Dispense Refill  . FLUCONAZOLE 100 MG PO TABS Oral Take 100 mg by mouth daily.    Marland Kitchen HYDROMORPHONE HCL 2 MG PO TABS Oral Take 2 mg by mouth every 4 (four) hours as needed. For pain    . LEVOTHYROXINE SODIUM 150 MCG PO TABS Oral Take 150 mcg by mouth daily.    Marland Kitchen LORAZEPAM 0.5 MG PO TABS Oral Take 0.5 mg by mouth daily. For anxiety    . ONDANSETRON HCL 8 MG PO TABS Oral Take 8 mg by mouth every 8 (eight) hours as needed. Nausea    . PROMETHAZINE HCL 25 MG PO TABS Oral Take 25 mg by mouth every 6 (six) hours as needed. Nausea    . TAMOXIFEN CITRATE 20 MG PO TABS Oral Take 20 mg by mouth daily.    . TRAMADOL HCL 50 MG PO TABS Oral Take 1 tablet (50 mg total) by mouth every 6 (six) hours as needed. For pain 120 tablet 0  . VENLAFAXINE HCL ER 75 MG PO CP24 Oral Take 2 capsules (150 mg total) by mouth daily. 30 capsule 12  . ONDANSETRON 4 MG PO TBDP Oral Take  1 tablet (4 mg total) by mouth every 8 (eight) hours as needed for nausea. 10 tablet 0  . OXYCODONE-ACETAMINOPHEN 5-325 MG PO TABS Oral Take 2 tablets by mouth every 4 (four) hours as needed for pain. 15 tablet 0  . PROMETHAZINE HCL 25 MG PO TABS Oral Take 1 tablet (25 mg total) by mouth every 6 (six) hours as needed for nausea. 12 tablet 0    BP 177/102  Pulse 100  Temp 97.8 F (36.6 C) (Oral)  Resp 28  SpO2 99%  Physical Exam  Nursing note and vitals reviewed. Constitutional: She appears well-developed and well-nourished.  HENT:  Head: Normocephalic and atraumatic.  Mouth/Throat: Oropharynx is clear and moist. No oropharyngeal exudate.  Eyes: Conjunctivae and EOM are normal. Pupils are equal, round, and reactive to light. Right eye exhibits no discharge. Left eye exhibits no discharge. No scleral icterus.  Neck: Normal range of motion. Neck supple. No JVD present. No  thyromegaly present.  Cardiovascular: Normal rate, regular rhythm, normal heart sounds and intact distal pulses.  Exam reveals no gallop and no friction rub.   No murmur heard. Pulmonary/Chest: Effort normal and breath sounds normal. No respiratory distress. She has no wheezes. She has no rales.  Abdominal: Soft. Bowel sounds are normal. She exhibits no distension and no mass. There is tenderness ( Mild diffuse tenderness, soft and no peritoneal signs). There is no rebound and no guarding.       No focal tenderness including at McBurney's point or right upper quadrant. Has a small scar in the periumbilical region status post hernia repair  Musculoskeletal: Normal range of motion. She exhibits no edema and no tenderness.  Lymphadenopathy:    She has no cervical adenopathy.  Neurological: She is alert. Coordination normal.  Skin: Skin is warm and dry. No rash noted. No erythema.  Psychiatric: She has a normal mood and affect. Her behavior is normal.    ED Course  Procedures (including critical care time)  Labs Reviewed  COMPREHENSIVE METABOLIC PANEL - Abnormal; Notable for the following:    Glucose, Bld 169 (*)     All other components within normal limits  CBC WITH DIFFERENTIAL - Abnormal; Notable for the following:    Hemoglobin 11.3 (*)     HCT 34.1 (*)     MCV 77.5 (*)     MCH 25.7 (*)     RDW 16.5 (*)     Neutrophils Relative 85 (*)     Lymphocytes Relative 10 (*)     All other components within normal limits  URINALYSIS, ROUTINE W REFLEX MICROSCOPIC - Abnormal; Notable for the following:    APPearance CLOUDY (*)     Glucose, UA 100 (*)     Ketones, ur 15 (*)     Leukocytes, UA TRACE (*)     All other components within normal limits  URINE MICROSCOPIC-ADD ON - Abnormal; Notable for the following:    Squamous Epithelial / LPF MANY (*)     Bacteria, UA MANY (*)     All other components within normal limits  POCT PREGNANCY, URINE  URINE CULTURE   No results found.   1.  Abdominal pain   2. Nausea and vomiting       MDM  There are no masses, no peritoneal signs at this time has no focal guarding if any region of the abdomen. She is not tachycardic though she does have persistent nausea and vomiting. Will obtain labs including blood and urine, symptomatic  medications, reevaluate.   I have reevaluated the patient at 3:30 AM, she has no more nausea and vomiting, has been resting peacefully, respiratory rate has normalized and I have discussed her lab work with her including a urinalysis which shows likely contamination (culture sent), CBC showing no leukocytosis and no significant anemia and metabolic panel showing normal liver and renal function. The patient appears stable for discharge, she only has mild abdominal pain, this is nonspecific, nonfocal and unlikely associated with a surgical condition at this time, I have recommended that she return for severe or worsening symptoms and she has agreed.  Discharge Prescriptions include:  Percocet Phenergan zofran   Vida Roller, MD 04/18/12 [phone]

## 2012-04-19 ENCOUNTER — Ambulatory Visit: Payer: Self-pay

## 2012-04-19 LAB — URINE CULTURE

## 2012-04-22 ENCOUNTER — Encounter (HOSPITAL_COMMUNITY): Payer: Self-pay | Admitting: *Deleted

## 2012-04-22 ENCOUNTER — Emergency Department (HOSPITAL_COMMUNITY)
Admission: EM | Admit: 2012-04-22 | Discharge: 2012-04-22 | Disposition: A | Discharge status: Home / Self Care | Payer: Medicare Other | Source: Home / Self Care | Attending: Emergency Medicine | Admitting: Emergency Medicine

## 2012-04-22 DIAGNOSIS — C786 Secondary malignant neoplasm of retroperitoneum and peritoneum: Secondary | ICD-10-CM | POA: Diagnosis present

## 2012-04-22 DIAGNOSIS — D649 Anemia, unspecified: Secondary | ICD-10-CM

## 2012-04-22 DIAGNOSIS — I10 Essential (primary) hypertension: Secondary | ICD-10-CM | POA: Diagnosis present

## 2012-04-22 DIAGNOSIS — C7952 Secondary malignant neoplasm of bone marrow: Secondary | ICD-10-CM | POA: Diagnosis present

## 2012-04-22 DIAGNOSIS — D630 Anemia in neoplastic disease: Secondary | ICD-10-CM | POA: Diagnosis present

## 2012-04-22 DIAGNOSIS — C50919 Malignant neoplasm of unspecified site of unspecified female breast: Secondary | ICD-10-CM

## 2012-04-22 DIAGNOSIS — R112 Nausea with vomiting, unspecified: Secondary | ICD-10-CM

## 2012-04-22 DIAGNOSIS — E876 Hypokalemia: Secondary | ICD-10-CM | POA: Diagnosis present

## 2012-04-22 DIAGNOSIS — R109 Unspecified abdominal pain: Secondary | ICD-10-CM

## 2012-04-22 DIAGNOSIS — K859 Acute pancreatitis without necrosis or infection, unspecified: Principal | ICD-10-CM | POA: Diagnosis present

## 2012-04-22 DIAGNOSIS — C7951 Secondary malignant neoplasm of bone: Secondary | ICD-10-CM | POA: Diagnosis present

## 2012-04-22 DIAGNOSIS — E039 Hypothyroidism, unspecified: Secondary | ICD-10-CM | POA: Diagnosis present

## 2012-04-22 DIAGNOSIS — C787 Secondary malignant neoplasm of liver and intrahepatic bile duct: Secondary | ICD-10-CM | POA: Diagnosis present

## 2012-04-22 LAB — CBC
HCT: 32.1 % — ABNORMAL LOW (ref 36.0–46.0)
Hemoglobin: 10.6 g/dL — ABNORMAL LOW (ref 12.0–15.0)
WBC: 6.8 10*3/uL (ref 4.0–10.5)

## 2012-04-22 LAB — COMPREHENSIVE METABOLIC PANEL
Alkaline Phosphatase: 87 U/L (ref 39–117)
BUN: 10 mg/dL (ref 6–23)
CO2: 26 mEq/L (ref 19–32)
Chloride: 101 mEq/L (ref 96–112)
GFR calc Af Amer: >90 mL/min (ref >90)
Glucose, Bld: 148 mg/dL — ABNORMAL HIGH (ref 70–99)
Potassium: 3.6 mEq/L (ref 3.5–5.1)
Total Bilirubin: 0.3 mg/dL (ref 0.3–1.2)

## 2012-04-22 LAB — LIPASE, BLOOD: Lipase: 45 U/L (ref 11–59)

## 2012-04-22 MED ORDER — SODIUM CHLORIDE 0.9 % IV BOLUS (SEPSIS)
1000.0000 mL | Freq: Once | INTRAVENOUS | Status: AC
  Administered 2012-04-22: 1000 mL via INTRAVENOUS

## 2012-04-22 MED ORDER — HYDROMORPHONE HCL PF 1 MG/ML IJ SOLN
1.0000 mg | Freq: Once | INTRAMUSCULAR | Status: AC
  Administered 2012-04-22: 1 mg via INTRAVENOUS
  Filled 2012-04-22: qty 1

## 2012-04-22 MED ORDER — ONDANSETRON HCL 4 MG/2ML IJ SOLN
4.0000 mg | Freq: Once | INTRAMUSCULAR | Status: AC
  Administered 2012-04-22: 4 mg via INTRAVENOUS
  Filled 2012-04-22: qty 2

## 2012-04-22 MED ORDER — METOCLOPRAMIDE HCL 10 MG PO TABS: 10.0000 mg | ORAL_TABLET | Freq: Four times a day (QID) | ORAL | Status: DC

## 2012-04-22 MED ORDER — DIPHENHYDRAMINE HCL 25 MG PO TABS: 25.0000 mg | ORAL_TABLET | Freq: Four times a day (QID) | ORAL | Status: DC | PRN

## 2012-04-22 MED ORDER — FAMOTIDINE IN NACL 20-0.9 MG/50ML-% IV SOLN
20.0000 mg | Freq: Once | INTRAVENOUS | Status: AC
  Administered 2012-04-22: 20 mg via INTRAVENOUS
  Filled 2012-04-22: qty 50

## 2012-04-22 NOTE — ED Notes (Signed)
Patient here with c/o abdominal pain and emesis today.  Patient states that the pain is all over and she vomited twice today

## 2012-04-22 NOTE — ED Notes (Signed)
Patient c/o lower abd pain that is 10/10.  States since she has stage 4 breast cancer she has been having lower abd pain.  Severe at times.

## 2012-04-22 NOTE — ED Provider Notes (Signed)
History     CSN: [id number]  Arrival date & time 04/22/12  7846   First MD Initiated Contact with Patient 04/22/12 1843      Chief Complaint  Patient presents with  . Emesis    (Consider location/radiation/quality/duration/timing/severity/associated sxs/prior treatment) HPI Comments: Kristin [name] 46 y.o. female   The chief complaint is: Patient presents with:   Emesis   The patient has medical history significant for:   Past Medical History:   Breast lump                                                  Thyroid disease                                              Hypertension                                                 Hx of radiation therapy                         06/2010, *     Comment:L breast, T3-T12 spine, L-S spine   met breast ca to bone                           dx'd 2/20*   Breast cancer                                   09/2009         Comment:L breast, ER+, PR-, Her 2 -  Patient presents with diffuse abdominal pain and emesis x 4. Patient has stage IV metastatic breast cancer, last chemotherapy treatment was 2 weeks ago. Patient states her abdominal pain is 8/10. Denies fever or chills, but reports diaphoresis. Denies hematemesis or diarrhea. Denies dysuria, urgency, frequency or vaginal discharge. Patient has had multiple admissions for the same complaint.      The history is provided by the patient.    Past Medical History  Diagnosis Date  . Breast lump   . Thyroid disease   . Hypertension   . Hx of radiation therapy 06/2010, 10/2009, 11/2011    L breast, T3-T12 spine, L-S spine  . 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 Breast cancer 09/2009    L breast, ER+, PR-, Her 2 -    Past Surgical History  Procedure Date  . Knee arthroscopy     left  . Breast surgery     left lumpectomy    History reviewed. No pertinent family history.  History  Substance Use Topics  . Smoking status: Former Games developer  . Smokeless tobacco: Not on file  . Alcohol  Use: No    OB History    Grav Para Term Preterm Abortions TAB SAB Ect Mult Living                  Review of Systems  Constitutional: Positive for diaphoresis. Negative for  fever and chills.  Gastrointestinal: Positive for nausea, vomiting and abdominal pain. Negative for diarrhea.  Genitourinary: Negative for dysuria, urgency, frequency and vaginal discharge.  All other systems reviewed and are negative.    Allergies  Review of patient's allergies indicates no known allergies.  Home Medications   Current Outpatient Rx  Name Route Sig Dispense Refill  . FLUCONAZOLE 100 MG PO TABS Oral Take 100 mg by mouth daily.    Marland Kitchen HYDROMORPHONE HCL 2 MG PO TABS Oral Take 2 mg by mouth every 4 (four) hours as needed. For pain    . LEVOTHYROXINE SODIUM 150 MCG PO TABS Oral Take 150 mcg by mouth daily.    Marland Kitchen LORAZEPAM 0.5 MG PO TABS Oral Take 0.5 mg by mouth daily. For anxiety    . ONDANSETRON HCL 8 MG PO TABS Oral Take 8 mg by mouth every 8 (eight) hours as needed. Nausea    . OXYCODONE-ACETAMINOPHEN 5-325 MG PO TABS Oral Take 2 tablets by mouth every 4 (four) hours as needed for pain. 15 tablet 0  . PROMETHAZINE HCL 25 MG PO TABS Oral Take 25 mg by mouth every 6 (six) hours as needed. Nausea    . TAMOXIFEN CITRATE 20 MG PO TABS Oral Take 20 mg by mouth daily.    . TRAMADOL HCL 50 MG PO TABS Oral Take 1 tablet (50 mg total) by mouth every 6 (six) hours as needed. For pain 120 tablet 0  . VENLAFAXINE HCL ER 75 MG PO CP24 Oral Take 2 capsules (150 mg total) by mouth daily. 30 capsule 12    BP 138/88  Pulse 99  Temp 97.7 F (36.5 C) (Oral)  Resp 22  SpO2 97%  Physical Exam  Nursing note and vitals reviewed. Constitutional: She is oriented to person, place, and time. She appears well-developed and well-nourished. She appears distressed.  HENT:  Head: Normocephalic and atraumatic.  Mouth/Throat: Oropharynx is clear and moist.  Eyes: Conjunctivae normal and EOM are normal. No scleral  icterus.  Neck: Normal range of motion. Neck supple.  Cardiovascular: Regular rhythm and normal heart sounds.        Patient tachycardic on exam.  Pulmonary/Chest: Effort normal and breath sounds normal. She has no wheezes.  Abdominal: Soft. Bowel sounds are normal. She exhibits no distension and no mass. There is tenderness. There is guarding. There is no rebound.       Patient is diffusely tender in all quadrants.  Neurological: She is alert and oriented to person, place, and time.  Skin: Skin is warm and dry.    ED Course  Procedures (including critical care time)   Labs Reviewed  CBC  COMPREHENSIVE METABOLIC PANEL  LIPASE, BLOOD  URINALYSIS, ROUTINE W REFLEX MICROSCOPIC  PREGNANCY, URINE   Results for orders placed during the hospital encounter of 04/22/12  CBC      Component Value Range   WBC 6.8  4.0 - 10.5 K/uL   RBC 4.24  3.87 - 5.11 MIL/uL   Hemoglobin 10.6 (*) 12.0 - 15.0 g/dL   HCT 16.1 (*) 09.6 - 04.5 %   MCV 75.7 (*) 78.0 - 100.0 fL   MCH 25.0 (*) 26.0 - 34.0 pg   MCHC 33.0  30.0 - 36.0 g/dL   RDW 40.9 (*) 81.1 - 91.4 %   Platelets 362  150 - 400 K/uL  COMPREHENSIVE METABOLIC PANEL      Component Value Range   Sodium 136  135 - 145 mEq/L   Potassium  3.6  3.5 - 5.1 mEq/L   Chloride 101  96 - 112 mEq/L   CO2 26  19 - 32 mEq/L   Glucose, Bld 148 (*) 70 - 99 mg/dL   BUN 10  6 - 23 mg/dL   Creatinine, Ser 9.60  0.50 - 1.10 mg/dL   Calcium 9.6  8.4 - 45.4 mg/dL   Total Protein 7.5  6.0 - 8.3 g/dL   Albumin 3.3 (*) 3.5 - 5.2 g/dL   AST 18  0 - 37 U/L   ALT 12  0 - 35 U/L   Alkaline Phosphatase 87  39 - 117 U/L   Total Bilirubin 0.3  0.3 - 1.2 mg/dL   GFR calc non Af Amer >90  >90 mL/min   GFR calc Af Amer >90  >90 mL/min  LIPASE, BLOOD      Component Value Range   Lipase 45  11 - 59 U/L    No results found.   1. Nausea and vomiting   2. 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 Abdominal pain   4. Anemia       MDM  Patient presented  with nausea and vomiting. Patient has been seen for this multiple times for this presentation. Patient has stage IV breast cancer and these symptoms are most likely the result of her metastatic disease. CBC: remarkable for known microcytic anemia. CMP: remarkable for hyperglycemia and hypoalbuminemia. Patient pain 8/10 given pain, antinausea medication, H2 blocker due to history or reflux. Patient improved and feels ready for discharge. Patient has pain medication at home. Discharged on metoclopramide and benadryl for persistent nausea. No red flags for pancreatitis or acute abdomen. Return precautions given.        Pixie Casino, PA-C 04/22/12 2331

## 2012-04-22 NOTE — ED Notes (Signed)
Patient stated she ate some salad with [place] dressing on it then she started with the nausea/vomiting.  C/o lower abd pain

## 2012-04-23 ENCOUNTER — Emergency Department (HOSPITAL_COMMUNITY): Payer: Medicare Other

## 2012-04-23 ENCOUNTER — Inpatient Hospital Stay (HOSPITAL_COMMUNITY)
Admission: EM | Admit: 2012-04-23 | Discharge: 2012-04-27 | DRG: 439 | Disposition: A | Discharge status: Home / Self Care | Payer: Medicare Other | Attending: Internal Medicine | Admitting: Internal Medicine

## 2012-04-23 ENCOUNTER — Encounter (HOSPITAL_COMMUNITY): Payer: Self-pay | Admitting: Emergency Medicine

## 2012-04-23 DIAGNOSIS — C801 Malignant (primary) neoplasm, unspecified: Secondary | ICD-10-CM

## 2012-04-23 DIAGNOSIS — K859 Acute pancreatitis without necrosis or infection, unspecified: Secondary | ICD-10-CM

## 2012-04-23 DIAGNOSIS — C50919 Malignant neoplasm of unspecified site of unspecified female breast: Secondary | ICD-10-CM

## 2012-04-23 DIAGNOSIS — D649 Anemia, unspecified: Secondary | ICD-10-CM

## 2012-04-23 DIAGNOSIS — F329 Major depressive disorder, single episode, unspecified: Secondary | ICD-10-CM

## 2012-04-23 DIAGNOSIS — C800 Disseminated malignant neoplasm, unspecified: Secondary | ICD-10-CM

## 2012-04-23 DIAGNOSIS — E860 Dehydration: Secondary | ICD-10-CM

## 2012-04-23 LAB — HEPATIC FUNCTION PANEL
ALT: 12 U/L (ref 0–35)
AST: 17 U/L (ref 0–37)
Total Protein: 7 g/dL (ref 6.0–8.3)

## 2012-04-23 LAB — BASIC METABOLIC PANEL
BUN: 8 mg/dL (ref 6–23)
CO2: 24 mEq/L (ref 19–32)
Calcium: 9 mg/dL (ref 8.4–10.5)
Creatinine, Ser: 0.76 mg/dL (ref 0.50–1.10)
GFR calc non Af Amer: >90 mL/min (ref >90)
Glucose, Bld: 93 mg/dL (ref 70–99)
Sodium: 135 mEq/L (ref 135–145)

## 2012-04-23 LAB — CBC WITH DIFFERENTIAL/PLATELET
Basophils Relative: 0 % (ref 0–1)
Eosinophils Absolute: 0.2 10*3/uL (ref 0.0–0.7)
Eosinophils Relative: 3 % (ref 0–5)
MCH: 25 pg — ABNORMAL LOW (ref 26.0–34.0)
MCHC: 32.8 g/dL (ref 30.0–36.0)
MCV: 76.2 fL — ABNORMAL LOW (ref 78.0–100.0)
Neutrophils Relative %: 81 % — ABNORMAL HIGH (ref 43–77)
Platelets: 365 10*3/uL (ref 150–400)
RDW: 17 % — ABNORMAL HIGH (ref 11.5–15.5)

## 2012-04-23 LAB — APTT: aPTT: 35 seconds (ref 24–37)

## 2012-04-23 LAB — PROTIME-INR: Prothrombin Time: 14.5 seconds (ref 11.6–15.2)

## 2012-04-23 MED ORDER — SODIUM CHLORIDE 0.9 % IV SOLN
20.0000 mL | INTRAVENOUS | Status: DC
  Administered 2012-04-23: 100 mL via INTRAVENOUS

## 2012-04-23 MED ORDER — HYDROMORPHONE HCL PF 1 MG/ML IJ SOLN
1.0000 mg | Freq: Once | INTRAMUSCULAR | Status: AC
  Administered 2012-04-23: 1 mg via INTRAVENOUS
  Filled 2012-04-23: qty 1

## 2012-04-23 MED ORDER — HYDROMORPHONE HCL PF 1 MG/ML IJ SOLN
1.0000 mg | INTRAMUSCULAR | Status: DC | PRN
  Administered 2012-04-23 – 2012-04-26 (×13): 1 mg via INTRAVENOUS
  Filled 2012-04-23 (×13): qty 1

## 2012-04-23 MED ORDER — DIPHENHYDRAMINE HCL 25 MG PO CAPS: 25.0000 mg | ORAL_CAPSULE | Freq: Four times a day (QID) | ORAL | Status: DC | PRN

## 2012-04-23 MED ORDER — TAMOXIFEN CITRATE 10 MG PO TABS
20.0000 mg | ORAL_TABLET | Freq: Every day | ORAL | Status: DC
  Administered 2012-04-23 – 2012-04-24 (×2): 20 mg via ORAL
  Filled 2012-04-23 (×3): qty 2

## 2012-04-23 MED ORDER — VENLAFAXINE HCL ER 150 MG PO CP24
150.0000 mg | ORAL_CAPSULE | Freq: Every day | ORAL | Status: DC
  Administered 2012-04-23 – 2012-04-27 (×5): 150 mg via ORAL
  Filled 2012-04-23 (×5): qty 1

## 2012-04-23 MED ORDER — ONDANSETRON HCL 4 MG/2ML IJ SOLN
4.0000 mg | Freq: Four times a day (QID) | INTRAMUSCULAR | Status: DC | PRN
  Administered 2012-04-26 – 2012-04-27 (×4): 4 mg via INTRAVENOUS
  Filled 2012-04-23 (×4): qty 2

## 2012-04-23 MED ORDER — ONDANSETRON HCL 4 MG PO TABS
4.0000 mg | ORAL_TABLET | Freq: Four times a day (QID) | ORAL | Status: DC | PRN
  Administered 2012-04-27: 4 mg via ORAL
  Filled 2012-04-23: qty 1

## 2012-04-23 MED ORDER — ACETAMINOPHEN 650 MG RE SUPP: 650.0000 mg | Freq: Four times a day (QID) | RECTAL | Status: DC | PRN

## 2012-04-23 MED ORDER — IOHEXOL 300 MG/ML  SOLN
100.0000 mL | Freq: Once | INTRAMUSCULAR | Status: AC | PRN
  Administered 2012-04-23: 100 mL via INTRAVENOUS

## 2012-04-23 MED ORDER — ONDANSETRON HCL 4 MG/2ML IJ SOLN: 4.0000 mg | Freq: Three times a day (TID) | INTRAMUSCULAR | Status: DC | PRN

## 2012-04-23 MED ORDER — LORAZEPAM 0.5 MG PO TABS
0.5000 mg | ORAL_TABLET | Freq: Every day | ORAL | Status: DC
  Administered 2012-04-24 – 2012-04-26 (×3): 0.5 mg via ORAL
  Filled 2012-04-23 (×4): qty 1

## 2012-04-23 MED ORDER — ONDANSETRON HCL 4 MG/2ML IJ SOLN
4.0000 mg | Freq: Once | INTRAMUSCULAR | Status: AC
  Administered 2012-04-23: 4 mg via INTRAVENOUS
  Filled 2012-04-23: qty 2

## 2012-04-23 MED ORDER — LEVOTHYROXINE SODIUM 150 MCG PO TABS
150.0000 ug | ORAL_TABLET | Freq: Every day | ORAL | Status: DC
  Administered 2012-04-24 – 2012-04-27 (×4): 150 ug via ORAL
  Filled 2012-04-23 (×5): qty 1

## 2012-04-23 MED ORDER — SODIUM CHLORIDE 0.9 % IV SOLN: INTRAVENOUS | Status: DC

## 2012-04-23 MED ORDER — ACETAMINOPHEN 325 MG PO TABS: 650.0000 mg | ORAL_TABLET | Freq: Four times a day (QID) | ORAL | Status: DC | PRN

## 2012-04-23 MED ORDER — SODIUM CHLORIDE 0.9 % IV BOLUS (SEPSIS)
1000.0000 mL | Freq: Once | INTRAVENOUS | Status: AC
  Administered 2012-04-23: 1000 mL via INTRAVENOUS

## 2012-04-23 MED ORDER — FLUCONAZOLE 100 MG PO TABS
100.0000 mg | ORAL_TABLET | Freq: Every day | ORAL | Status: DC
  Administered 2012-04-23 – 2012-04-27 (×5): 100 mg via ORAL
  Filled 2012-04-23 (×5): qty 1

## 2012-04-23 MED ORDER — SODIUM CHLORIDE 0.9 % IV SOLN
INTRAVENOUS | Status: DC
  Administered 2012-04-23: 1000 mL via INTRAVENOUS
  Administered 2012-04-24: 15:00:00 via INTRAVENOUS
  Administered 2012-04-24: 1000 mL via INTRAVENOUS
  Administered 2012-04-24 – 2012-04-25 (×3): via INTRAVENOUS

## 2012-04-23 NOTE — ED Notes (Signed)
Pt c/o abd and back pain crying pain is the worst.Pt was seen yesterday for the same c/o and stated no change hx Ca breast with Mes to the bone

## 2012-04-23 NOTE — ED Provider Notes (Signed)
History     CSN: [id number]  Arrival date & time 04/23/12  1407   First MD Initiated Contact with Patient 04/23/12 1536      Chief Complaint  Patient presents with  . Abdominal Pain  . Back Pain    (Consider location/radiation/quality/duration/timing/severity/associated sxs/prior treatment) HPI Comments: 46 year old female with a history of metastatic stage IV breast cancer with spread to the bones, possible abdominal spread comes in with cc of abd pain. Pt has had several visits to the ED recently for pain, and this is her 3rd visit t othe ED in 3 days. Pt states that she has significant abd pain - diffuse for the past several days. There is no precipitating, aggravating or relieving factors, and patient has f/c associated with this. She does have intermittent nausea and emesis and her BM have been normal, non bloody. Pt has poor apetite. There is no uti like sx.   Patient is a 46 y.o. female presenting with abdominal pain and back pain. The history is provided by the patient and medical records.  Abdominal Pain The primary symptoms of the illness include abdominal pain, nausea and vomiting. The primary symptoms of the illness do not include shortness of breath or dysuria.  Additional symptoms associated with the illness include back pain.  Back Pain  Associated symptoms include abdominal pain. Pertinent negatives include no chest pain, no headaches and no dysuria.    Past Medical History  Diagnosis Date  . Breast lump   . Thyroid disease   . Hypertension   . Hx of radiation therapy 06/2010, 10/2009, 11/2011    L breast, T3-T12 spine, L-S spine  . 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 Breast cancer 09/2009    L breast, ER+, PR-, Her 2 -    Past Surgical History  Procedure Date  . Knee arthroscopy     left  . Breast surgery     left lumpectomy    History reviewed. No pertinent family history.  History  Substance Use Topics  . Smoking status: Former Games developer  .  Smokeless tobacco: Not on file  . Alcohol Use: No    OB History    Grav Para Term Preterm Abortions TAB SAB Ect Mult Living                  Review of Systems  Constitutional: Positive for activity change.  HENT: Negative for neck pain.   Respiratory: Negative for shortness of breath.   Cardiovascular: Negative for chest pain.  Gastrointestinal: Positive for nausea, vomiting and abdominal pain. Negative for blood in stool.  Genitourinary: Negative for dysuria.  Musculoskeletal: Positive for back pain.  Neurological: Negative for headaches.    Allergies  Review of patient's allergies indicates no known allergies.  Home Medications   Current Outpatient Rx  Name Route Sig Dispense Refill  . DIPHENHYDRAMINE HCL 25 MG PO TABS Oral Take 25 mg by mouth every 6 (six) hours as needed.    Marland Kitchen FLUCONAZOLE 100 MG PO TABS Oral Take 100 mg by mouth daily.    Marland Kitchen HYDROMORPHONE HCL 2 MG PO TABS Oral Take 2 mg by mouth every 4 (four) hours as needed. For pain    . LEVOTHYROXINE SODIUM 150 MCG PO TABS Oral Take 150 mcg by mouth daily.    Marland Kitchen LORAZEPAM 0.5 MG PO TABS Oral Take 0.5 mg by mouth daily. For anxiety    . METOCLOPRAMIDE HCL 10 MG PO TABS Oral Take 10 mg by  mouth every 6 (six) hours. Take with Benadryl at bedtime    . ONDANSETRON HCL 8 MG PO TABS Oral Take 8 mg by mouth every 8 (eight) hours as needed. Nausea    . OXYCODONE-ACETAMINOPHEN 5-325 MG PO TABS Oral Take 2 tablets by mouth every 4 (four) hours as needed. Pain    . PROMETHAZINE HCL 25 MG PO TABS Oral Take 25 mg by mouth every 6 (six) hours as needed. Nausea    . TAMOXIFEN CITRATE 20 MG PO TABS Oral Take 20 mg by mouth daily.    . TRAMADOL HCL 50 MG PO TABS Oral Take 1 tablet (50 mg total) by mouth every 6 (six) hours as needed. For pain 120 tablet 0  . VENLAFAXINE HCL ER 75 MG PO CP24 Oral Take 2 capsules (150 mg total) by mouth daily. 30 capsule 12    BP 138/96  Pulse 80  Temp 98.4 F (36.9 C) (Oral)  Resp 14  Ht 5\' 9"   (1.753 m)  Wt 156 lb (70.761 kg)  BMI 23.04 kg/m2  SpO2 100%  Physical Exam  Nursing note and vitals reviewed. Constitutional: She is oriented to person, place, and time. She appears well-developed and well-nourished.  HENT:  Head: Normocephalic and atraumatic.  Eyes: EOM are normal. Pupils are equal, round, and reactive to light.  Neck: Neck supple.  Cardiovascular: Normal rate, regular rhythm and normal heart sounds.   No murmur heard. Pulmonary/Chest: Effort normal. No respiratory distress.  Abdominal: Soft. She exhibits no distension and no mass. There is tenderness. There is guarding. There is no rebound.  Neurological: She is alert and oriented to person, place, and time.  Skin: Skin is warm and dry.    ED Course  Procedures (including critical care time)  Labs Reviewed  CBC WITH DIFFERENTIAL - Abnormal; Notable for the following:    Hemoglobin 10.2 (*)     HCT 31.1 (*)     MCV 76.2 (*)     MCH 25.0 (*)     RDW 17.0 (*)     Neutrophils Relative 81 (*)     Lymphocytes Relative 9 (*)     Lymphs Abs 0.6 (*)     All other components within normal limits  LIPASE, BLOOD  BASIC METABOLIC PANEL  APTT  PROTIME-INR  HEPATIC FUNCTION PANEL   No results found.   No diagnosis found.    MDM  Pt comes in with cc of abd pain. The pain is diffuse, and getting worse. She has advanced breat cancer, and possible mets to the abd. Pt on my exam has some guarding, no rebound. The pain is diffuse. We will get Ct abd to see if there is any masses, bleedings, infection. She is s/p radiation, not on any radiation tx at this time.  Pt has no one controlling her pain at this time. She has been taking qid norco at home. She is suffering in pain, and i think it is appropriate to admit her this time for pain control and optimizing her pain regimen.        Derwood Kaplan, MD 04/23/12 [phone]

## 2012-04-23 NOTE — ED Notes (Signed)
Attempted to call report on pt to 3W; RN unable to take report at this time.

## 2012-04-23 NOTE — H&P (Signed)
Kristin [name] is an 46 y.o. female.  Patient was seen and examined on April 23, 2012 at 8:15 PM. PCP - Dr. Tyson Dense. Oncologist - Dr. Lorita Officer.  Chief Complaint:  abdominal pain with nausea. HPI:  46 year old female with history of metastatic breast cancer, hypothyroidism has been experiencing abdominal pain over the last one month with periods of nausea. Her pain is episodic comes and goes. Eating sometimes increases the pain. She has been coming to the ER multiple times. Today since the pain was more persistent CT abdomen pelvis done which shows pancreatitis. Lipase is mildly elevated. Patient still has abdominal pain which is controlled with IV Dilaudid. Patient has been admitted for further management. Patient denies any diarrhea. Denies any chest pain or shortness of breath. Denies any weaker than upper lower extremities or incontinence of urine.  Past Medical History  Diagnosis Date  . Breast lump   . Thyroid disease   . Hypertension   . Hx of radiation therapy 06/2010, 10/2009, 11/2011    L breast, T3-T12 spine, L-S spine  . 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 Breast cancer 09/2009    L breast, ER+, PR-, Her 2 -    Past Surgical History  Procedure Date  . Knee arthroscopy     left  . Breast surgery     left lumpectomy    History reviewed. No pertinent family history. Social History:  reports that she has quit smoking. She does not have any smokeless tobacco history on file. She reports that she does not drink alcohol or use illicit drugs.  Allergies: No Known Allergies   (Not in a hospital admission)  Results for orders placed during the hospital encounter of 04/23/12 (from the past 48 hour(s))  LIPASE, BLOOD     Status: Abnormal   Collection Time   04/23/12  6:00 PM      Component Value Range Comment   Lipase 61 (*) 11 - 59 U/L   BASIC METABOLIC PANEL     Status: Normal   Collection Time   04/23/12  6:00 PM      Component Value Range Comment   Sodium 135  135 -  145 mEq/L    Potassium 3.9  3.5 - 5.1 mEq/L    Chloride 102  96 - 112 mEq/L    CO2 24  19 - 32 mEq/L    Glucose, Bld 93  70 - 99 mg/dL    BUN 8  6 - 23 mg/dL    Creatinine, Ser 4.69  0.50 - 1.10 mg/dL    Calcium 9.0  8.4 - 62.9 mg/dL    GFR calc non Af Amer >90  >90 mL/min    GFR calc Af Amer >90  >90 mL/min   CBC WITH DIFFERENTIAL     Status: Abnormal   Collection Time   04/23/12  6:00 PM      Component Value Range Comment   WBC 6.5  4.0 - 10.5 K/uL    RBC 4.08  3.87 - 5.11 MIL/uL    Hemoglobin 10.2 (*) 12.0 - 15.0 g/dL    HCT 52.8 (*) 41.3 - 46.0 %    MCV 76.2 (*) 78.0 - 100.0 fL    MCH 25.0 (*) 26.0 - 34.0 pg    MCHC 32.8  30.0 - 36.0 g/dL    RDW 24.4 (*) 01.0 - 15.5 %    Platelets 365  150 - 400 K/uL    Neutrophils Relative 81 (*) 43 -  77 %    Neutro Abs 5.3  1.7 - 7.7 K/uL    Lymphocytes Relative 9 (*) 12 - 46 %    Lymphs Abs 0.6 (*) 0.7 - 4.0 K/uL    Monocytes Relative 7  3 - 12 %    Monocytes Absolute 0.4  0.1 - 1.0 K/uL    Eosinophils Relative 3  0 - 5 %    Eosinophils Absolute 0.2  0.0 - 0.7 K/uL    Basophils Relative 0  0 - 1 %    Basophils Absolute 0.0  0.0 - 0.1 K/uL   APTT     Status: Normal   Collection Time   04/23/12  6:00 PM      Component Value Range Comment   aPTT 35  24 - 37 seconds   PROTIME-INR     Status: Normal   Collection Time   04/23/12  6:00 PM      Component Value Range Comment   Prothrombin Time 14.5  11.6 - 15.2 seconds    INR 1.11  0.00 - 1.49   HEPATIC FUNCTION PANEL     Status: Abnormal   Collection Time   04/23/12  6:00 PM      Component Value Range Comment   Total Protein 7.0  6.0 - 8.3 g/dL    Albumin 3.1 (*) 3.5 - 5.2 g/dL    AST 17  0 - 37 U/L    ALT 12  0 - 35 U/L    Alkaline Phosphatase 74  39 - 117 U/L    Total Bilirubin 0.2 (*) 0.3 - 1.2 mg/dL    Bilirubin, Direct <9.1  0.0 - 0.3 mg/dL    Indirect Bilirubin NOT CALCULATED  0.3 - 0.9 mg/dL    Ct Abdomen Pelvis W Contrast  04/23/2012  *RADIOLOGY REPORT*  Clinical Data:  Abdominal pain with vomiting.  Breast cancer.  CT ABDOMEN AND PELVIS WITH CONTRAST  Technique:  Multidetector CT imaging of the abdomen and pelvis was performed following the standard protocol during bolus administration of intravenous contrast.  Contrast: OMNIPAQUE IOHEXOL 300 MG/ML  SOLN  Comparison: Abdominal pelvic CT 04/07/2012.  Findings: Small bilateral pleural effusions have enlarged.  The lung bases are clear.  A metastasis centrally in the right hepatic lobe adjacent to the IVC is best seen on the delayed images and has slightly enlarged, measuring 3.5 cm in diameter.  The liver otherwise appears normal. The gallbladder and biliary system appear normal.  There is new ill- defined enlargement of the pancreatic tail with mild surrounding soft tissue swelling and ill-defined fluid.  In this patient with a mildly elevated lipase level, these findings are supportive of acute pancreatitis.  The splenic and portal veins are patent.  The spleen and adrenal glands appear normal.  There is no hydronephrosis.  Scattered prominent retroperitoneal and mesenteric lymph nodes are stable.  There is also stable mild peritoneal nodularity.  The patient has developed a small amount of pelvic ascites.  Central low density anteriorly in the uterus likely represents a fibroid. There is no discrete adnexal mass.  Widespread osseous metastatic disease is again demonstrated with pathologic rib fractures and several Schmorl's nodes.  There is a pathologic fracture through the right L4 lamina and the right L5 pars interarticularis.   No significant epidural tumor is identified. There is a sclerotic metastasis involving the left femoral head and neck.  There are lytic metastases involving the right acetabulum.  IMPRESSION:  1.  New enlargement of  the pancreatic body and tail with surrounding edema consistent with acute pancreatitis. 2.  Increased pelvic ascites. 3.  Widespread osseous metastatic disease with scattered  pathologic fractures as described.  No significant epidural tumor identified. 4.  Hepatic metastasis appears slightly larger.   Original Report Authenticated By: Gerrianne Scale, M.D.     Review of Systems  Constitutional: Negative.   HENT: Negative.   Eyes: Negative.   Respiratory: Negative.   Cardiovascular: Negative.   Gastrointestinal: Positive for nausea and abdominal pain.  Musculoskeletal: Positive for back pain and joint pain.  Skin: Negative.   Neurological: Negative.   Endo/Heme/Allergies: Negative.   Psychiatric/Behavioral: Negative.     Blood pressure 159/87, pulse 78, temperature 98.4 F (36.9 C), temperature source Oral, resp. rate 15, height 5\' 9"  (1.753 m), weight 70.761 kg (156 lb), SpO2 93.00%. Physical Exam  Constitutional: She is oriented to person, place, and time. She appears well-developed and well-nourished. No distress.  HENT:  Head: Normocephalic and atraumatic.  Right Ear: External ear normal.  Left Ear: External ear normal.  Nose: Nose normal.  Mouth/Throat: Oropharynx is clear and moist. No oropharyngeal exudate.  Eyes: Conjunctivae normal are normal. Pupils are equal, round, and reactive to light. Right eye exhibits no discharge. Left eye exhibits no discharge. No scleral icterus.  Neck: Normal range of motion. Neck supple.  Cardiovascular: Normal rate and regular rhythm.   Respiratory: Effort normal and breath sounds normal. No respiratory distress. She has no wheezes. She has no rales.  GI: Soft. Bowel sounds are normal. She exhibits no distension. There is tenderness. There is no rebound and no guarding.  Musculoskeletal: Normal range of motion. She exhibits no edema and no tenderness.  Neurological: She is alert and oriented to person, place, and time.       Moves all extremities.  Skin: Skin is warm and dry. No rash noted. She is not diaphoretic. No erythema.     Assessment/Plan #1. Acute pancreatitis in a patient with known history of  metastatic breast cancer stage IV - at this time the cause for her pancreatitis is not clear. May be cancer related. Patient does not drink alcohol and gallbladder does not show any gallstones. We'll keep patient n.p.o. for now until a.m. and if patient's pain is controlled may start clear liquid diet. In addition the CAT scan shows metastatic disease to the liver and spine. Patient at this time does not have any features of cord compression. Need to consult Dr. Lorita Officer in a.m. for further recommendations. #2. Hypothyroidism - continue Synthroid. #3. Chronic anemia - follow CBC.  CODE STATUS - full code.  Eduard Clos 04/23/2012, 8:43 PM

## 2012-04-23 NOTE — Progress Notes (Signed)
Medical triage note 46 year old female with a history of metastatic stage IV breast cancer with spread to the bones and possible abdomen; HTN, thyroid disease. Came tot he hospital complaining of uncontrolled pain and decrease po intake/ dehydration due to her symptoms. Admitted for pain control. Dr. Darnelle Catalan is her oncologist. Pain responded to dilaudid in ED. Received also 1 L of fluid and CT abdomen has been ordered but is pending at this point.  Will admit to med-surg bed, control pain, involved palliative/hospice if not already involved. Find a good regimen to control symptoms as an outpatient.  Kristin [name] [phone]

## 2012-04-24 ENCOUNTER — Telehealth: Payer: Self-pay | Admitting: *Deleted

## 2012-04-24 ENCOUNTER — Encounter (HOSPITAL_COMMUNITY): Payer: Self-pay

## 2012-04-24 ENCOUNTER — Inpatient Hospital Stay (HOSPITAL_COMMUNITY): Payer: Medicare Other

## 2012-04-24 DIAGNOSIS — C786 Secondary malignant neoplasm of retroperitoneum and peritoneum: Secondary | ICD-10-CM

## 2012-04-24 DIAGNOSIS — C7952 Secondary malignant neoplasm of bone marrow: Secondary | ICD-10-CM

## 2012-04-24 DIAGNOSIS — C787 Secondary malignant neoplasm of liver and intrahepatic bile duct: Secondary | ICD-10-CM

## 2012-04-24 LAB — COMPREHENSIVE METABOLIC PANEL
BUN: 6 mg/dL (ref 6–23)
Calcium: 9 mg/dL (ref 8.4–10.5)
Creatinine, Ser: 0.77 mg/dL (ref 0.50–1.10)
GFR calc Af Amer: >90 mL/min (ref >90)
Glucose, Bld: 73 mg/dL (ref 70–99)
Sodium: 137 mEq/L (ref 135–145)
Total Protein: 6.6 g/dL (ref 6.0–8.3)

## 2012-04-24 LAB — CBC WITH DIFFERENTIAL/PLATELET
Basophils Absolute: 0 10*3/uL (ref 0.0–0.1)
HCT: 28.9 % — ABNORMAL LOW (ref 36.0–46.0)
Hemoglobin: 9.3 g/dL — ABNORMAL LOW (ref 12.0–15.0)
Lymphocytes Relative: 13 % (ref 12–46)
Monocytes Absolute: 0.5 10*3/uL (ref 0.1–1.0)
Monocytes Relative: 9 % (ref 3–12)
Neutro Abs: 3.9 10*3/uL (ref 1.7–7.7)
Neutrophils Relative %: 73 % (ref 43–77)
RBC: 3.75 MIL/uL — ABNORMAL LOW (ref 3.87–5.11)
WBC: 5.4 10*3/uL (ref 4.0–10.5)

## 2012-04-24 LAB — LIPID PANEL
Cholesterol: 130 mg/dL (ref 0–200)
HDL: 52 mg/dL (ref >39)
LDL Cholesterol: 61 mg/dL (ref 0–99)
Triglycerides: 86 mg/dL (ref <150)
VLDL: 17 mg/dL (ref 0–40)

## 2012-04-24 LAB — GLUCOSE, CAPILLARY
Glucose-Capillary: 92 mg/dL (ref 70–99)
Glucose-Capillary: 137 mg/dL — ABNORMAL HIGH (ref 70–99)

## 2012-04-24 MED ORDER — GADOBENATE DIMEGLUMINE 529 MG/ML IV SOLN
15.0000 mL | Freq: Once | INTRAVENOUS | Status: AC | PRN
  Administered 2012-04-24: 14 mL via INTRAVENOUS

## 2012-04-24 MED ORDER — BOOST / RESOURCE BREEZE PO LIQD
1.0000 | Freq: Two times a day (BID) | ORAL | Status: DC
  Administered 2012-04-24 – 2012-04-26 (×4): 1 via ORAL

## 2012-04-24 MED ORDER — INFLUENZA VIRUS VACC SPLIT PF IM SUSP
0.5000 mL | INTRAMUSCULAR | Status: AC
  Administered 2012-04-25: 0.5 mL via INTRAMUSCULAR
  Filled 2012-04-24: qty 0.5

## 2012-04-24 NOTE — ED Provider Notes (Signed)
Medical screening examination/treatment/procedure(s) were performed by non-physician practitioner and as supervising physician I was immediately available for consultation/collaboration.  Tobin Chad, MD 04/24/12 [phone]

## 2012-04-24 NOTE — Progress Notes (Signed)
[facility] PROGRESS NOTE  Kristin [name] ZOX:[id number] DOB: 02/22/1966 DOA: 04/23/2012 PCP: [name], MD  Assessment/Plan: Principal Problem:  *Pancreatitis, acute Active Problems:  Breast cancer metastasized to multiple sites  Anemia  #1. Acute pancreatitis in a patient with known history of metastatic breast cancer stage IV - at this time the cause for her pancreatitis is not clear. May be cancer related. Patient does not drink alcohol and gallbladder does not show any gallstones. No new chemotherapy medication started the patient . The patient wants to start a clear liquid diet and is extremely insistent. In addition the CAT scan shows metastatic disease to the liver and spine. Patient at this time does not have any features of cord compression. Consulted Dr. Lorita Officer further recommendations  #2. Hypothyroidism - continue Synthroid.  #3. Chronic anemia - follow CBC.  CODE STATUS - full code.  Disposition patient wants to discuss home hospice options with social worker  HPI/Subjective: Wants to eat   Objective: Filed Vitals:   04/23/12 2100 04/23/12 2145 04/23/12 2233 04/24/12 0630  BP: 151/118 156/86 157/90 140/83  Pulse: 76 77 75 76  Temp:   97.7 F (36.5 C) 97.8 F (36.6 C)  TempSrc:   Oral Oral  Resp:   17 18  Height:      Weight:    68.312 kg (150 lb 9.6 oz)  SpO2: 97% 96% 98% 95%    Intake/Output Summary (Last 24 hours) at 04/24/12 1203 Last data filed at 04/24/12 0738  Gross per 24 hour  Intake      0 ml  Output    800 ml  Net   -800 ml    Exam:  HENT:  Head: Atraumatic.  Nose: Nose normal.  Mouth/Throat: Oropharynx is clear and moist.  Eyes: Conjunctivae are normal. Pupils are equal, round, and reactive to light. No scleral icterus.  Neck: Neck supple. No tracheal deviation present.  Cardiovascular: Normal rate, regular rhythm, normal heart sounds and intact distal pulses.  Pulmonary/Chest: Effort normal and breath sounds normal. No  respiratory distress.  Abdominal: Soft. Normal appearance and bowel sounds are normal. She exhibits no distension. There is no tenderness.  Musculoskeletal: She exhibits no edema and no tenderness.  Neurological: She is alert. No cranial nerve deficit.    Data Reviewed: Basic Metabolic Panel:  Lab 04/24/12 8119 04/23/12 1800 04/22/12 1952 04/18/12 0002  NA 137 135 136 139  K 3.6 3.9 3.6 3.6  CL 104 102 101 101  CO2 27 24 26 27   GLUCOSE 73 93 148* 169*  BUN 6 8 10 8   CREATININE 0.77 0.76 0.74 0.79  CALCIUM 9.0 9.0 9.6 9.9  MG -- -- -- --  PHOS -- -- -- --    Liver Function Tests:  Lab 04/24/12 0400 04/23/12 1800 04/22/12 1952 04/18/12 0002  AST 15 17 18 17   ALT 11 12 12 13   ALKPHOS 71 74 87 84  BILITOT 0.3 0.2* 0.3 0.3  PROT 6.6 7.0 7.5 8.2  ALBUMIN 2.8* 3.1* 3.3* 3.6    Lab 04/23/12 1800 04/22/12 1952  LIPASE 61* 45  AMYLASE -- --   No results found for this basename: AMMONIA:5 in the last 168 hours  CBC:  Lab 04/24/12 0400 04/23/12 1800 04/22/12 1952 04/18/12 0002  WBC 5.4 6.5 6.8 7.7  NEUTROABS 3.9 5.3 -- 6.5  HGB 9.3* 10.2* 10.6* 11.3*  HCT 28.9* 31.1* 32.1* 34.1*  MCV 77.1* 76.2* 75.7* 77.5*  PLT 310 365 362 359    Cardiac  Enzymes: No results found for this basename: CKTOTAL:5,CKMB:5,CKMBINDEX:5,TROPONINI:5 in the last 168 hours BNP (last 3 results) No results found for this basename: PROBNP:3 in the last 8760 hours   CBG:  Lab 04/24/12 1147 04/24/12 0746 04/24/12 0437 04/24/12 0005  GLUCAP 109* 73 72 86    Recent Results (from the past 240 hour(s))  URINE CULTURE     Status: Normal   Collection Time   04/18/12  2:49 AM      Component Value Range Status Comment   Specimen Description URINE, CATHETERIZED   Final    Special Requests NONE   Final    Culture  Setup Time 04/18/2012 08:00   Final    Colony Count 20,OOO COLONIES/ML   Final    Culture     Final    Value: Multiple bacterial morphotypes present, none predominant. Suggest appropriate  recollection if clinically indicated.   Report Status 04/19/2012 FINAL   Final      Studies: Dg Hip Complete Right  03/31/2012  *RADIOLOGY REPORT*  Clinical Data: Breast cancer.  Right hip pain.  RIGHT HIP - COMPLETE 2+ VIEW  Comparison: 10/19/2011 bone scan.  Findings: Sclerotic focus within the left femoral head/neck corresponding to an area of abnormal uptake on prior bone scan compatible with sclerotic metastasis.  The areas of abnormal uptake within the left sacroiliac region not visualized on today's radiographs.  No fracture, subluxation or dislocation.  Hip joints are maintained and unremarkable.  IMPRESSION: Sclerotic focus within the left femoral head/neck compatible with bone metastasis.  No additional visible bony abnormality.   Original Report Authenticated By: Cyndie Chime, M.D.    Ct Abdomen Pelvis W Contrast  04/23/2012  *RADIOLOGY REPORT*  Clinical Data: Abdominal pain with vomiting.  Breast cancer.  CT ABDOMEN AND PELVIS WITH CONTRAST  Technique:  Multidetector CT imaging of the abdomen and pelvis was performed following the standard protocol during bolus administration of intravenous contrast.  Contrast: OMNIPAQUE IOHEXOL 300 MG/ML  SOLN  Comparison: Abdominal pelvic CT 04/07/2012.  Findings: Small bilateral pleural effusions have enlarged.  The lung bases are clear.  A metastasis centrally in the right hepatic lobe adjacent to the IVC is best seen on the delayed images and has slightly enlarged, measuring 3.5 cm in diameter.  The liver otherwise appears normal. The gallbladder and biliary system appear normal.  There is new ill- defined enlargement of the pancreatic tail with mild surrounding soft tissue swelling and ill-defined fluid.  In this patient with a mildly elevated lipase level, these findings are supportive of acute pancreatitis.  The splenic and portal veins are patent.  The spleen and adrenal glands appear normal.  There is no hydronephrosis.  Scattered prominent  retroperitoneal and mesenteric lymph nodes are stable.  There is also stable mild peritoneal nodularity.  The patient has developed a small amount of pelvic ascites.  Central low density anteriorly in the uterus likely represents a fibroid. There is no discrete adnexal mass.  Widespread osseous metastatic disease is again demonstrated with pathologic rib fractures and several Schmorl's nodes.  There is a pathologic fracture through the right L4 lamina and the right L5 pars interarticularis.   No significant epidural tumor is identified. There is a sclerotic metastasis involving the left femoral head and neck.  There are lytic metastases involving the right acetabulum.  IMPRESSION:  1.  New enlargement of the pancreatic body and tail with surrounding edema consistent with acute pancreatitis. 2.  Increased pelvic ascites. 3.  Widespread  osseous metastatic disease with scattered pathologic fractures as described.  No significant epidural tumor identified. 4.  Hepatic metastasis appears slightly larger.   Original Report Authenticated By: Gerrianne Scale, M.D.    Ct Abdomen Pelvis W Contrast  04/07/2012  **ADDENDUM** CREATED: 04/07/2012 02:52:52  In addition to the findings described in the original report, there are peritoneal nodules that have increased in size or developed in the interval, concerning for metastatic disease.  The largest is within the abdomen anteriorly measuring 1.8 cm on series 2 image 33.  Additional smaller lesions for example posterior to the right hepatic lobe on series 2 image 24.  There is mild fullness of the right renal collecting system and proximal ureter with abrupt transition just below the UPJ.  This is nonspecific with symmetric renal enhancement and excretion however a urothelial or occult extrinsic lesion versus stricture is not excluded. Recommend correlation with creatinine and urologic consultation or attention at follow-up.  Discussed via telephone with Dr. Hyacinth Meeker at 02:55  a.m. on 04/07/2012.  **END ADDENDUM** SIGNED BY: Waneta Martins, M.D.   04/07/2012  *RADIOLOGY REPORT*  Clinical Data: Lower abdominal pain, nausea.  Breast cancer with known metastatic disease.  CT ABDOMEN AND PELVIS WITH CONTRAST  Technique:  Multidetector CT imaging of the abdomen and pelvis was performed following the standard protocol during bolus administration of intravenous contrast.  Contrast: OMNIPAQUE IOHEXOL 300 MG/ML  SOLN  Comparison: 02/21/2012 chest CT  Findings: Limited images through the lung bases demonstrate relative hyperlucency of the left lung base, similar to prior. There is trace left pleural effusion.  Mild right lung base opacity.  Heart size within normal limits.  There is a hypodense lesion within the medial aspect of the right hepatic lobe, abutting the IVC, which has increased in size in the interval, measuring up to 3.2 cm.  Unremarkable spleen, pancreas, biliary system, adrenal glands, kidneys.  No hydronephrosis or hydroureter.  No bowel obstruction.  No CT evidence for colitis.  No right lower quadrant inflammation.  No free intraperitoneal air or fluid.  There is scattered atherosclerotic calcification of the aorta and its branches. No aneurysmal dilatation.  Lobular uterus with heterogeneous enhancement, nonspecific. Smaller of free fluid within the pelvis.  Nonspecific appearance to the right adnexa.  Circumferential bladder wall thickening.  Evidence of prior posterior eleventh rib fractures with callous formation.  Sclerotic lesion within the L1 vertebral body as well as within the sacrum and pelvis. Mild T12 compression is similar to prior. Multiple vertebral body lesions noted on recent MRI.  IMPRESSION: Interval disease progression with increase in size of a hepatic lesion as described above.  Osseous metastases.  Nonspecific appearance to the uterus and adnexa.   Original Report Authenticated By: Waneta Martins, M.D.     Scheduled Meds:   . feeding  supplement  1 Container Oral BID BM  . fluconazole  100 mg Oral Daily  . HYDROmorphone  1 mg Intravenous Once  .  HYDROmorphone (DILAUDID) injection  1 mg Intravenous Once  .  HYDROmorphone (DILAUDID) injection  1 mg Intravenous Once  . influenza  inactive virus vaccine  0.5 mL Intramuscular Tomorrow-1000  . levothyroxine  150 mcg Oral QAC breakfast  . LORazepam  0.5 mg Oral Daily  . ondansetron  4 mg Intravenous Once  . ondansetron  4 mg Intravenous Once  . sodium chloride  1,000 mL Intravenous Once  . tamoxifen  20 mg Oral Daily  . venlafaxine XR  150 mg  Oral Daily  . DISCONTD: sodium chloride   Intravenous STAT   Continuous Infusions:   . sodium chloride 150 mL/hr at 04/24/12 1017  . DISCONTD: sodium chloride 100 mL (04/23/12 1541)    Principal Problem:  *Pancreatitis, acute Active Problems:  Breast cancer metastasized to multiple sites  Anemia    Time spent: 40 minutes   [facility]  [facility] Pager [phone]. If 8PM-8AM, please contact night-coverage at [email or web address], password [facility] 04/24/2012, 12:03 PM  LOS: 1 day

## 2012-04-24 NOTE — Telephone Encounter (Signed)
THE ABOVE INFORMATION WAS GIVEN TO DR.MAGRINAT'S NURSE, VAL DODD,RN. DR.MAGRINAT IS AWARE PT. IS IN [facility]. HE WILL BE SEEING PT. NOTIFIED JOSEPHINE. SHE VOICES UNDERSTANDING.

## 2012-04-24 NOTE — Progress Notes (Signed)
Kristin [name]   DOB:May 31, 1966   WU#:[id number]   GNF#:[id number]  Subjective: Kristin [name] is currently comfortable as far as pain is concerned; she has no appetite, but seems to be tolerating clear liquids well. Denies nausea or vomiting currently; Left hip pain is relatively well controlled but "I walk with a limp now." Sister in room, very insistent on meeting with SW regarding obtaining help for the patient at home   Objective: middle aged [place] Amrican woman examined in bed Filed Vitals:   04/24/12 0630  BP: 140/83  Pulse: 76  Temp: 97.8 F (36.6 C)  Resp: 18    Body mass index is 22.24 kg/(m^2).  Intake/Output Summary (Last 24 hours) at 04/24/12 1334 Last data filed at 04/24/12 0738  Gross per 24 hour  Intake      0 ml  Output    800 ml  Net   -800 ml     Sclerae unicteric  Oropharynx clear  No peripheral adenopathy  Lungs clear -- no rales or rhonchi  Heart regular rate and rhythm  Abdomen surprisingly benign-- NT, no masses, positive though diminished BS  MSK no peripheral edema  Neuro nonfocal  Breast exam:deferred  CBG (last 3)   Basename 04/24/12 1147 04/24/12 0746 04/24/12 0437  GLUCAP 109* 73 72     Labs:  Lab Results  Component Value Date   WBC 5.4 04/24/2012   HGB 9.3* 04/24/2012   HCT 28.9* 04/24/2012   MCV 77.1* 04/24/2012   PLT 310 04/24/2012   NEUTROABS 3.9 04/24/2012    Urine Studies No results found for this basename: UACOL:2,UAPR:2,USPG:2,UPH:2,UTP:2,UGL:2,UKET:2,UBIL:2,UHGB:2,UNIT:2,UROB:2,ULEU:2,UEPI:2,UWBC:2,URBC:2,UBAC:2,CAST:2,CRYS:2,UCOM:2,BILUA:2 in the last 72 hours  Basic Metabolic Panel:  Lab 04/24/12 8469 04/23/12 1800 04/22/12 1952 04/18/12 0002  NA 137 135 136 139  K 3.6 3.9 -- --  CL 104 102 101 101  CO2 27 24 26 27   GLUCOSE 73 93 148* 169*  BUN 6 8 10 8   CREATININE 0.77 0.76 0.74 0.79  CALCIUM 9.0 9.0 9.6 9.9  MG -- -- -- --  PHOS -- -- -- --   GFR Estimated Creatinine Clearance: 91.8 ml/min (by C-G formula based on Cr  of 0.77). Liver Function Tests:  Lab 04/24/12 0400 04/23/12 1800 04/22/12 1952 04/18/12 0002  AST 15 17 18 17   ALT 11 12 12 13   ALKPHOS 71 74 87 84  BILITOT 0.3 0.2* 0.3 0.3  PROT 6.6 7.0 7.5 8.2  ALBUMIN 2.8* 3.1* 3.3* 3.6    Lab 04/23/12 1800 04/22/12 1952  LIPASE 61* 45  AMYLASE -- --   No results found for this basename: AMMONIA:5 in the last 168 hours Coagulation profile  Lab 04/23/12 1800  INR 1.11  PROTIME --    CBC:  Lab 04/24/12 0400 04/23/12 1800 04/22/12 1952 04/18/12 0002  WBC 5.4 6.5 6.8 7.7  NEUTROABS 3.9 5.3 -- 6.5  HGB 9.3* 10.2* 10.6* 11.3*  HCT 28.9* 31.1* 32.1* 34.1*  MCV 77.1* 76.2* 75.7* 77.5*  PLT 310 365 362 359   Cardiac Enzymes: No results found for this basename: CKTOTAL:5,CKMB:5,CKMBINDEX:5,TROPONINI:5 in the last 168 hours BNP: No components found with this basename: POCBNP:5 CBG:  Lab 04/24/12 1147 04/24/12 0746 04/24/12 0437 04/24/12 0005  GLUCAP 109* 73 72 86   D-Dimer No results found for this basename: DDIMER:2 in the last 72 hours Hgb A1c No results found for this basename: HGBA1C:2 in the last 72 hours Lipid Profile  Basename 04/24/12 0400  CHOL 130  HDL 52  LDLCALC 61  TRIG 86  CHOLHDL 2.5  LDLDIRECT --   Thyroid function studies  Basename 04/24/12 0400  TSH 22.225*  T4TOTAL --  T3FREE --  THYROIDAB --   Anemia work up No results found for this basename: VITAMINB12:2,FOLATE:2,FERRITIN:2,TIBC:2,IRON:2,RETICCTPCT:2 in the last 72 hours Microbiology Recent Results (from the past 240 hour(s))  URINE CULTURE     Status: Normal   Collection Time   04/18/12  2:49 AM      Component Value Range Status Comment   Specimen Description URINE, CATHETERIZED   Final    Special Requests NONE   Final    Culture  Setup Time 04/18/2012 08:00   Final    Colony Count 20,OOO COLONIES/ML   Final    Culture     Final    Value: Multiple bacterial morphotypes present, none predominant. Suggest appropriate recollection if clinically  indicated.   Report Status 04/19/2012 FINAL   Final       Studies:  Ct Abdomen Pelvis W Contrast  04/23/2012  *RADIOLOGY REPORT*  Clinical Data: Abdominal pain with vomiting.  Breast cancer.  CT ABDOMEN AND PELVIS WITH CONTRAST  Technique:  Multidetector CT imaging of the abdomen and pelvis was performed following the standard protocol during bolus administration of intravenous contrast.  Contrast: OMNIPAQUE IOHEXOL 300 MG/ML  SOLN  Comparison: Abdominal pelvic CT 04/07/2012.  Findings: Small bilateral pleural effusions have enlarged.  The lung bases are clear.  A metastasis centrally in the right hepatic lobe adjacent to the IVC is best seen on the delayed images and has slightly enlarged, measuring 3.5 cm in diameter.  The liver otherwise appears normal. The gallbladder and biliary system appear normal.  There is new ill- defined enlargement of the pancreatic tail with mild surrounding soft tissue swelling and ill-defined fluid.  In this patient with a mildly elevated lipase level, these findings are supportive of acute pancreatitis.  The splenic and portal veins are patent.  The spleen and adrenal glands appear normal.  There is no hydronephrosis.  Scattered prominent retroperitoneal and mesenteric lymph nodes are stable.  There is also stable mild peritoneal nodularity.  The patient has developed a small amount of pelvic ascites.  Central low density anteriorly in the uterus likely represents a fibroid. There is no discrete adnexal mass.  Widespread osseous metastatic disease is again demonstrated with pathologic rib fractures and several Schmorl's nodes.  There is a pathologic fracture through the right L4 lamina and the right L5 pars interarticularis.   No significant epidural tumor is identified. There is a sclerotic metastasis involving the left femoral head and neck.  There are lytic metastases involving the right acetabulum.  IMPRESSION:  1.  New enlargement of the pancreatic body and tail  with surrounding edema consistent with acute pancreatitis. 2.  Increased pelvic ascites. 3.  Widespread osseous metastatic disease with scattered pathologic fractures as described.  No significant epidural tumor identified. 4.  Hepatic metastasis appears slightly larger.   Original Report Authenticated By: Gerrianne Scale, M.D.     Assessment: 46 y.o. Kristin [name] woman with history of breast cancer stage IV at diagnosis February 2011, with metastases to bone, liver, peritoneum, now admitted with pancreatitis  (1) status post 30 Gy to T-3 through T-18 October 2009  (2) status post docetaxel, cyclophosphamide and doxorubicin x6, completed in June 2011.  (3) status post Left lumpectomy and sentinel lymph node biopsy August 2011 for a 2-mm focus of residual invasive ductal carcinoma, grade 2, negative sentinel lymph node  (4) tamoxifen started July  2011, with evidence of progression March 2013  (5) status post radiation therapy to Left breast completed November 2011  (6) s/p radiation to the lower spine, completed 11/15/2011  (7) fulvestrant/goserelin started 11/03/2011  (8) continuing on zolendronic acid monthly  Plan: I am not sure why Kristin [name] has developed pancreatic inflammation; an MRI of that area may give [place] more information. Symptomatically she is improved and as far as that goes she may be able to be discharged later this week.  The family feels strongly the patient needs help at home. Her son is there currently but is expected to leave with job-core sometime soon. We have placed a request with medicaid and I understand she is on a waiting list; however it will help to have SW meet with family to clarify options.  As far as her breast cancer is concerned, I will ask Dr Dayton Scrape to evaluate for possible palliative irradiation to the Left hip. She is progressing through the fulvestrant and we will discuss either exemestane/everolimus or chemotherapy. All treatments can be done on an outpatient  basis.  Will follow with you. Greatly appreciate your care to this patient!   Donnielle Addison C 04/24/2012

## 2012-04-24 NOTE — Clinical Documentation Improvement (Signed)
MALNUTRITION DOCUMENTATION CLARIFICATION  THIS DOCUMENT IS NOT A PERMANENT PART OF THE MEDICAL RECORD  TO RESPOND TO THE THIS QUERY, FOLLOW THE INSTRUCTIONS BELOW:  1. If needed, update documentation for the patient's encounter via the notes activity.  2. Access this query again and click edit on the In [employer].  3. After updating, or not, click F2 to complete all highlighted (required) fields concerning your review. Select "additional documentation in the medical record" OR "no additional documentation provided".  4. Click Sign note button.  5. The deficiency will fall out of your In Basket *Please let us know if you are not able to complete this workflow by phone or e-mail (listed below).  Please update your documentation within the medical record to reflect your response to this query.                                                                                        04/24/12   Dear [facility],  In a better effort to capture your patient's severity of illness, reflect appropriate length of stay and utilization of resources, a review of the patient medical record has revealed the following indicators.    Based on your clinical judgment, please clarify and document in a progress note and/or discharge summary the clinical condition associated with the following supporting information:  In responding to this query please exercise your independent judgment.  The fact that a query is asked, does not imply that any particular answer is desired or expected. 04/24/12 Nutr Eval with documentation criteria for..."-Severe malnutrition in the context of acute illness or injury".Marland KitchenMarland KitchenFor accurate Dx specificity & severity please help validate nutr dx for clinical cond being eval'd, mon'd & tx'd. Thank you  Possible Clinical Conditions? . Mild Malnutrition  . Moderate Malnutrition  . Severe Malnutrition  . Protein Calorie Malnutrition . Severe Protein Calorie  Malnutrition  . Emaciation  . Cachexia   Other Condition (please specify) Cannot Clinically Determine  Supporting Information: Risk Factors: see Nutrition Eval 04/24/12  Signs & Symptoms: see Nutrition Eval 04/24/12  -Diagnostics: see Nutrition Eval 04/24/12  Treatments: see Nutrition Eval 04/24/12  -Medications:  -Nutrition Consult: see Nutrition Eval 04/24/12  You may use possible, probable, or suspect with inpatient documentation. possible, probable, suspected diagnoses MUST be documented at the time of discharge  Reviewed: . Severe Malnutrition   Thank You,  Toribio Harbour, RN, BSN, CCDS Certified Clinical Documentation Specialist Pager: [phone]  Health Information Management [place]

## 2012-04-24 NOTE — Progress Notes (Signed)
INITIAL ADULT NUTRITION ASSESSMENT Date: 04/24/2012   Time: 10:55 AM Reason for Assessment: Nutrition risk   INTERVENTION: Diet advancement per MD. Resource Breeze BID. Will monitor.   Pt meets criteria for severe PCM of acute illness AEB <50% estimated energy intake for at least the past week with 11.7% weight loss in the past month and a half per pt report.   ASSESSMENT: Female 46 y.o.  Dx: Pancreatitis, acute  Food/Nutrition Related Hx: Pt with metastatic breast CA, last chemotherapy 2 weeks ago. Pt reports for the past month and half she has had uncontrolled nausea/vomiting and reports her intake has declined to only 1-2 meals/day. Pt reports her food choices have been bland foods and pt denies any alcohol use. Pt reports before this she was eating "around the clock". Pt reports 20 pound unintended weight loss in the past month. Pt reports she had started buying Boost nutrition supplement before all of this happened however she was unable to drink any r/t nausea/vomiting. Pt reports improvement in nausea today and was tolerating clear liquids.   Hx:  Past Medical History  Diagnosis Date  . Breast lump   . Thyroid disease   . Hypertension   . Hx of radiation therapy 06/2010, 10/2009, 11/2011    L breast, T3-T12 spine, L-S spine  . 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 Breast cancer 09/2009    L breast, ER+, PR-, Her 2 -   Related Meds:  Scheduled Meds:   . fluconazole  100 mg Oral Daily  . HYDROmorphone  1 mg Intravenous Once  .  HYDROmorphone (DILAUDID) injection  1 mg Intravenous Once  .  HYDROmorphone (DILAUDID) injection  1 mg Intravenous Once  . influenza  inactive virus vaccine  0.5 mL Intramuscular Tomorrow-1000  . levothyroxine  150 mcg Oral QAC breakfast  . LORazepam  0.5 mg Oral Daily  . ondansetron  4 mg Intravenous Once  . ondansetron  4 mg Intravenous Once  . sodium chloride  1,000 mL Intravenous Once  . tamoxifen  20 mg Oral Daily  . venlafaxine XR  150 mg Oral  Daily  . DISCONTD: sodium chloride   Intravenous STAT   Continuous Infusions:   . sodium chloride 150 mL/hr at 04/24/12 1017  . DISCONTD: sodium chloride 100 mL (04/23/12 1541)   PRN Meds:.acetaminophen, acetaminophen, diphenhydrAMINE, HYDROmorphone (DILAUDID) injection, iohexol, ondansetron (ZOFRAN) IV, ondansetron, DISCONTD: ondansetron (ZOFRAN) IV  Ht: 5\' 9"  (175.3 cm)  Wt: 150 lb 9.6 oz (68.312 kg)  Ideal Wt: 145 lb % Ideal Wt: 103  Usual Wt: 170 lb per pt report % Usual Wt: 88   Body mass index is 22.24 kg/(m^2).  Labs:  CMP     Component Value Date/Time   NA 137 04/24/2012 0400   NA 140 04/12/2012 1342   K 3.6 04/24/2012 0400   K 4.4 04/12/2012 1342   CL 104 04/24/2012 0400   CL 104 04/12/2012 1342   CO2 27 04/24/2012 0400   CO2 28 04/12/2012 1342   GLUCOSE 73 04/24/2012 0400   GLUCOSE 82 04/12/2012 1342   BUN 6 04/24/2012 0400   BUN 20.0 04/12/2012 1342   CREATININE 0.77 04/24/2012 0400   CREATININE 1.1 04/12/2012 1342   CALCIUM 9.0 04/24/2012 0400   PROT 6.6 04/24/2012 0400   PROT 6.8 04/12/2012 1342   ALBUMIN 2.8* 04/24/2012 0400   ALBUMIN 3.2* 04/12/2012 1342   AST 15 04/24/2012 0400   AST 15 04/12/2012 1342   ALT 11 04/24/2012  0400   ALT 16 04/12/2012 1342   ALKPHOS 71 04/24/2012 0400   ALKPHOS 79 04/12/2012 1342   BILITOT 0.3 04/24/2012 0400   BILITOT 0.20 04/12/2012 1342   GFRNONAA >90 04/24/2012 0400   GFRAA >90 04/24/2012 0400   Lipase  Date Value Range Status  04/23/2012 61* 11 - 59 U/L Final    Intake/Output Summary (Last 24 hours) at 04/24/12 1102 Last data filed at 04/24/12 5784  Gross per 24 hour  Intake      0 ml  Output    800 ml  Net   -800 ml   Last BM - 9/14    Diet Order: Clear Liquid   IVF:    sodium chloride Last Rate: 150 mL/hr at 04/24/12 1017  DISCONTD: sodium chloride Last Rate: 100 mL (04/23/12 1541)    Estimated Nutritional Needs:   Kcal:1700-2050 Protein:80-100g Fluid:1.7-2L  NUTRITION DIAGNOSIS: -Inadequate oral intake (NI-2.1).   Status: Ongoing  RELATED TO: nausea/vomiting, acute pancreatitis   AS EVIDENCE BY: pt statement, significant weight loss reported   MONITORING/EVALUATION(Goals): Advance diet as tolerated to low fat diet.   EDUCATION NEEDS: -Education needs addressed - discussed nutrition therapy for pancreatitis, nausea, and vomiting. Handouts of material provided, pt expressed understanding.    Dietitian #: [phone]  DOCUMENTATION CODES Per approved criteria  -Severe malnutrition in the context of acute illness or injury    Marshall Cork 04/24/2012, 10:55 AM

## 2012-04-25 ENCOUNTER — Ambulatory Visit: Payer: Self-pay

## 2012-04-25 LAB — COMPREHENSIVE METABOLIC PANEL
ALT: 11 U/L (ref 0–35)
AST: 15 U/L (ref 0–37)
Alkaline Phosphatase: 65 U/L (ref 39–117)
CO2: 27 mEq/L (ref 19–32)
Calcium: 8.5 mg/dL (ref 8.4–10.5)
Potassium: 3.3 mEq/L — ABNORMAL LOW (ref 3.5–5.1)
Sodium: 137 mEq/L (ref 135–145)
Total Protein: 5.9 g/dL — ABNORMAL LOW (ref 6.0–8.3)

## 2012-04-25 LAB — GLUCOSE, CAPILLARY
Glucose-Capillary: 91 mg/dL (ref 70–99)
Glucose-Capillary: 82 mg/dL (ref 70–99)
Glucose-Capillary: 90 mg/dL (ref 70–99)

## 2012-04-25 LAB — CBC
MCH: 25.5 pg — ABNORMAL LOW (ref 26.0–34.0)
MCHC: 33.1 g/dL (ref 30.0–36.0)
Platelets: 319 10*3/uL (ref 150–400)
RBC: 3.45 MIL/uL — ABNORMAL LOW (ref 3.87–5.11)

## 2012-04-25 LAB — RETICULOCYTES
RBC.: 3.45 MIL/uL — ABNORMAL LOW (ref 3.87–5.11)
Retic Count, Absolute: 38 10*3/uL (ref 19.0–186.0)
Retic Ct Pct: 1.1 % (ref 0.4–3.1)

## 2012-04-25 MED ORDER — POTASSIUM CHLORIDE IN NACL 20-0.9 MEQ/L-% IV SOLN
INTRAVENOUS | Status: DC
  Administered 2012-04-25 – 2012-04-26 (×4): via INTRAVENOUS
  Filled 2012-04-25 (×9): qty 1000

## 2012-04-25 NOTE — Progress Notes (Signed)
Spoke with pt, pt's mother and sisters at bedside concerning discharge plans, needs and disposition. Pt would like to go home with hospice and selected [facility]. Referral  Called to [name], RN  Palliative Medicine. mp

## 2012-04-25 NOTE — Progress Notes (Signed)
When the nurse tech entered the room the mother of the patient stated that the patient had just finished eating candy.  I advised the Pt. That this was not part of the ordered full liquids diet.  I then further explained the reasoning behind the need to slowly progress her diet.

## 2012-04-25 NOTE — Progress Notes (Signed)
Thanks to [name], RN  Palliative Care for explaining to the  pt and mother at bedside, Home Hospice Care. Pt understands that she is not a candidate at present time for Home Hospice. Pt states that she would like Home Health RN at discharge and selected [facility]. mp

## 2012-04-25 NOTE — Progress Notes (Signed)
Kristin [name]   DOB:01-Feb-1966   BJ#:[id number]   HYQ#:[id number]  Subjective: Kristin [name] is "OK" this morning--"I just got a shot." No BM past 24 h. No nausea or vomiting. Not walking in halls. No family in room   Objective: middle aged African Amrican woman examined in bed Filed Vitals:   04/25/12 0636  BP: 158/93  Pulse: 76  Temp: 98.3 F (36.8 C)  Resp: 16    Body mass index is 22.24 kg/(m^2).  Intake/Output Summary (Last 24 hours) at 04/25/12 0802 Last data filed at 04/25/12 0757  Gross per 24 hour  Intake   4014 ml  Output   3400 ml  Net    614 ml     Sclerae unicteric  Oropharynx clear  No peripheral adenopathy  Lungs clear -- no rales or rhonchi  Heart regular rate and rhythm  Abdomen soft, +BS, mild/moderate tenderness LUQ, no masses palpated  MSK no peripheral edema  Neuro nonfocal  Breast exam:deferred  CBG (last 3)   Basename 04/25/12 0734 04/24/12 1957 04/24/12 1550  GLUCAP 91 137* 92     Labs:  Lab Results  Component Value Date   WBC 5.5 04/25/2012   HGB 8.8* 04/25/2012   HCT 26.6* 04/25/2012   MCV 77.1* 04/25/2012   PLT 319 04/25/2012   NEUTROABS 3.9 04/24/2012    Urine Studies No results found for this basename: UACOL:2,UAPR:2,USPG:2,UPH:2,UTP:2,UGL:2,UKET:2,UBIL:2,UHGB:2,UNIT:2,UROB:2,ULEU:2,UEPI:2,UWBC:2,URBC:2,UBAC:2,CAST:2,CRYS:2,UCOM:2,BILUA:2 in the last 72 hours  Basic Metabolic Panel:  Lab 04/25/12 9528 04/24/12 0400 04/23/12 1800 04/22/12 1952  NA 137 137 135 136  K 3.3* 3.6 -- --  CL 104 104 102 101  CO2 27 27 24 26   GLUCOSE 122* 73 93 148*  BUN 4* 6 8 10   CREATININE 0.77 0.77 0.76 0.74  CALCIUM 8.5 9.0 9.0 9.6  MG -- -- -- --  PHOS -- -- -- --   GFR Estimated Creatinine Clearance: 91.8 ml/min (by C-G formula based on Cr of 0.77). Liver Function Tests:  Lab 04/25/12 0330 04/24/12 0400 04/23/12 1800 04/22/12 1952  AST 15 15 17 18   ALT 11 11 12 12   ALKPHOS 65 71 74 87  BILITOT 0.2* 0.3 0.2* 0.3  PROT 5.9* 6.6 7.0 7.5  ALBUMIN  2.6* 2.8* 3.1* 3.3*    Lab 04/25/12 0330 04/23/12 1800 04/22/12 1952  LIPASE 57 61* 45  AMYLASE 115* -- --   No results found for this basename: AMMONIA:5 in the last 168 hours Coagulation profile  Lab 04/23/12 1800  INR 1.11  PROTIME --    CBC:  Lab 04/25/12 0330 04/24/12 0400 04/23/12 1800 04/22/12 1952  WBC 5.5 5.4 6.5 6.8  NEUTROABS -- 3.9 5.3 --  HGB 8.8* 9.3* 10.2* 10.6*  HCT 26.6* 28.9* 31.1* 32.1*  MCV 77.1* 77.1* 76.2* 75.7*  PLT 319 310 365 362   Cardiac Enzymes: No results found for this basename: CKTOTAL:5,CKMB:5,CKMBINDEX:5,TROPONINI:5 in the last 168 hours BNP: No components found with this basename: POCBNP:5 CBG:  Lab 04/25/12 0734 04/24/12 1957 04/24/12 1550 04/24/12 1147 04/24/12 0746  GLUCAP 91 137* 92 109* 73   D-Dimer No results found for this basename: DDIMER:2 in the last 72 hours Hgb A1c No results found for this basename: HGBA1C:2 in the last 72 hours Lipid Profile  Basename 04/24/12 0400  CHOL 130  HDL 52  LDLCALC 61  TRIG 86  CHOLHDL 2.5  LDLDIRECT --   Thyroid function studies  Basename 04/24/12 0400  TSH 22.225*  T4TOTAL --  T3FREE --  THYROIDAB --  Anemia work up  [employer] 04/25/12 0330  VITAMINB12 --  FOLATE --  FERRITIN --  TIBC --  IRON --  RETICCTPCT 1.1   Microbiology Recent Results (from the past 240 hour(s))  URINE CULTURE     Status: Normal   Collection Time   04/18/12  2:49 AM      Component Value Range Status Comment   Specimen Description URINE, CATHETERIZED   Final    Special Requests NONE   Final    Culture  Setup Time 04/18/2012 08:00   Final    Colony Count 20,OOO COLONIES/ML   Final    Culture     Final    Value: Multiple bacterial morphotypes present, none predominant. Suggest appropriate recollection if clinically indicated.   Report Status 04/19/2012 FINAL   Final       Studies:  Ct Abdomen Pelvis W Contrast  04/23/2012  *RADIOLOGY REPORT*  Clinical Data: Abdominal pain with vomiting.   Breast cancer.  CT ABDOMEN AND PELVIS WITH CONTRAST  Technique:  Multidetector CT imaging of the abdomen and pelvis was performed following the standard protocol during bolus administration of intravenous contrast.  Contrast: OMNIPAQUE IOHEXOL 300 MG/ML  SOLN  Comparison: Abdominal pelvic CT 04/07/2012.  Findings: Small bilateral pleural effusions have enlarged.  The lung bases are clear.  A metastasis centrally in the right hepatic lobe adjacent to the IVC is best seen on the delayed images and has slightly enlarged, measuring 3.5 cm in diameter.  The liver otherwise appears normal. The gallbladder and biliary system appear normal.  There is new ill- defined enlargement of the pancreatic tail with mild surrounding soft tissue swelling and ill-defined fluid.  In this patient with a mildly elevated lipase level, these findings are supportive of acute pancreatitis.  The splenic and portal veins are patent.  The spleen and adrenal glands appear normal.  There is no hydronephrosis.  Scattered prominent retroperitoneal and mesenteric lymph nodes are stable.  There is also stable mild peritoneal nodularity.  The patient has developed a small amount of pelvic ascites.  Central low density anteriorly in the uterus likely represents a fibroid. There is no discrete adnexal mass.  Widespread osseous metastatic disease is again demonstrated with pathologic rib fractures and several Schmorl's nodes.  There is a pathologic fracture through the right L4 lamina and the right L5 pars interarticularis.   No significant epidural tumor is identified. There is a sclerotic metastasis involving the left femoral head and neck.  There are lytic metastases involving the right acetabulum.  IMPRESSION:  1.  New enlargement of the pancreatic body and tail with surrounding edema consistent with acute pancreatitis. 2.  Increased pelvic ascites. 3.  Widespread osseous metastatic disease with scattered pathologic fractures as described.  No  significant epidural tumor identified. 4.  Hepatic metastasis appears slightly larger.   Original Report Authenticated By: Gerrianne Scale, M.D.     Assessment: 46 y.o. [place] woman with history of breast cancer stage IV at diagnosis February 2011, with metastases to bone, liver, peritoneum, now admitted with pancreatitis  (1) status post 30 Gy to T-3 through T-18 October 2009  (2) status post docetaxel, cyclophosphamide and doxorubicin x6, completed in June 2011.  (3) status post Left lumpectomy and sentinel lymph node biopsy August 2011 for a 2-mm focus of residual invasive ductal carcinoma, grade 2, negative sentinel lymph node  (4) tamoxifen started July 2011, with evidence of progression March 2013  (5) status post radiation therapy to Left breast  completed November 2011  (6) s/p radiation to the lower spine, completed 11/15/2011  (7) fulvestrant/goserelin started 11/03/2011  (8) continuing on zolendronic acid monthly  Plan: MRI was done yesterday but not yet read.  I discussed family's insistence that patient go home with an aide or other daily support with our outpatient SW--she will not be in charge of d/c planning, but will contact with the floor SW to ensure continuity of care. Hopefully the SW can meet with the patient, patient's sister and mother today to discuss the patient's discharge needs. Note that the patient would only qualify for hospice if she decided against all life-prolonging therapy (including exemestane, etc). The patient has not agreed to this, instead she wants continuing treatment. The family seems to have a confused understanding of what Hospice can offer the patient though we have discussed this previously. It will be helpful for SW to reinforce this.  As far as her breast cancer is concerned, I will ask Dr Dayton Scrape to evaluate for possible palliative irradiation to the Left hip. She is progressing through the fulvestrant and we will discuss either  exemestane/everolimus or chemotherapy. All treatments can be done on an outpatient basis. I have stopped the patient's tamoxifen (she has already progressed through that treatment).  If you feel the patient is ready, would advance diet and change all meds to po to facilitate discharge later this week.  Will follow with you. Greatly appreciate your care to this patient!   MAGRINAT,GUSTAV C 04/25/2012

## 2012-04-25 NOTE — Progress Notes (Addendum)
[facility] PROGRESS NOTE  Kristin [name] JWJ:[id number] DOB: [birth year] DOA: 04/23/2012 PCP: [name], MD  Assessment/Plan: Principal Problem:  *Pancreatitis, acute Active Problems:  Breast cancer metastasized to multiple sites  Anemia     #1. Acute pancreatitis in a patient with known history of metastatic breast cancer stage IV - at this time the cause for her pancreatitis is not clear. May be cancer related. Patient does not drink alcohol and gallbladder does not show any gallstones. No new chemotherapy medication started the patient . The patient wants to start a clear liquid diet and is extremely insistent. In addition the CAT scan shows metastatic disease to the liver and spine. Patient at this time does not have any features of cord compression. [name], MD Has ordered MRI of the abdomen results still pending. Advancing diet slowly to full liquids   #2. Hypothyroidism - continue Synthroid.   #3. Chronic anemia - follow CBC. Hemoglobin trending down, we'll defer management to oncology  #4 hypokalemia replete  CODE STATUS - full code.   Disposition patient wants to discuss home hospice options with social worker , per oncology patient would only qualify for hospice if she decided against all life-prolonging therapy (including exemestane, etc. The family seems to have a confused understanding of what Hospice can offer the patient though we have discussed this previously. It will be helpful for SW to reinforce this Advancing diet slowly to full liquids     HPI/Subjective:  No nausea or vomiting. Not walking in halls. No family in room    Objective: Filed Vitals:   04/23/12 2233 04/24/12 0630 04/24/12 1300 04/25/12 0636  BP: 157/90 140/83 142/82 158/93  Pulse: 75 76 79 76  Temp: 97.7 F (36.5 C) 97.8 F (36.6 C) 97.6 F (36.4 C) 98.3 F (36.8 C)  TempSrc: Oral Oral  Oral  Resp: 17 18 16 16   Height:      Weight:  68.312 kg (150 lb 9.6 oz)     SpO2: 98% 95% 97% 96%    Intake/Output Summary (Last 24 hours) at 04/25/12 0904 Last data filed at 04/25/12 0757  Gross per 24 hour  Intake   4014 ml  Output   3400 ml  Net    614 ml    Exam:  HENT:  Head: Atraumatic.  Nose: Nose normal.  Mouth/Throat: Oropharynx is clear and moist.  Eyes: Conjunctivae are normal. Pupils are equal, round, and reactive to light. No scleral icterus.  Neck: Neck supple. No tracheal deviation present.  Cardiovascular: Normal rate, regular rhythm, normal heart sounds and intact distal pulses.  Pulmonary/Chest: Effort normal and breath sounds normal. No respiratory distress.  Abdominal: Soft. Normal appearance and bowel sounds are normal. She exhibits no distension. There is no tenderness.  Musculoskeletal: She exhibits no edema and no tenderness.  Neurological: She is alert. No cranial nerve deficit.    Data Reviewed: Basic Metabolic Panel:  Lab 04/25/12 6213 04/24/12 0400 04/23/12 1800 04/22/12 1952  NA 137 137 135 136  K 3.3* 3.6 3.9 3.6  CL 104 104 102 101  CO2 27 27 24 26   GLUCOSE 122* 73 93 148*  BUN 4* 6 8 10   CREATININE 0.77 0.77 0.76 0.74  CALCIUM 8.5 9.0 9.0 9.6  MG -- -- -- --  PHOS -- -- -- --    Liver Function Tests:  Lab 04/25/12 0330 04/24/12 0400 04/23/12 1800 04/22/12 1952  AST 15 15 17 18   ALT 11 11 12 12   ALKPHOS  65 71 74 87  BILITOT 0.2* 0.3 0.2* 0.3  PROT 5.9* 6.6 7.0 7.5  ALBUMIN 2.6* 2.8* 3.1* 3.3*    Lab 04/25/12 0330 04/23/12 1800 04/22/12 1952  LIPASE 57 61* 45  AMYLASE 115* -- --   No results found for this basename: AMMONIA:5 in the last 168 hours  CBC:  Lab 04/25/12 0330 04/24/12 0400 04/23/12 1800 04/22/12 1952  WBC 5.5 5.4 6.5 6.8  NEUTROABS -- 3.9 5.3 --  HGB 8.8* 9.3* 10.2* 10.6*  HCT 26.6* 28.9* 31.1* 32.1*  MCV 77.1* 77.1* 76.2* 75.7*  PLT 319 310 365 362    Cardiac Enzymes: No results found for this basename: CKTOTAL:5,CKMB:5,CKMBINDEX:5,TROPONINI:5 in the last 168 hours BNP  (last 3 results) No results found for this basename: PROBNP:3 in the last 8760 hours   CBG:  Lab 04/25/12 0734 04/24/12 1957 04/24/12 1550 04/24/12 1147 04/24/12 0746  GLUCAP 91 137* 92 109* 73    Recent Results (from the past 240 hour(s))  URINE CULTURE     Status: Normal   Collection Time   04/18/12  2:49 AM      Component Value Range Status Comment   Specimen Description URINE, CATHETERIZED   Final    Special Requests NONE   Final    Culture  Setup Time 04/18/2012 08:00   Final    Colony Count 20,OOO COLONIES/ML   Final    Culture     Final    Value: Multiple bacterial morphotypes present, none predominant. Suggest appropriate recollection if clinically indicated.   Report Status 04/19/2012 FINAL   Final      Studies: Dg Hip Complete Right  03/31/2012  *RADIOLOGY REPORT*  Clinical Data: Breast cancer.  Right hip pain.  RIGHT HIP - COMPLETE 2+ VIEW  Comparison: 10/19/2011 bone scan.  Findings: Sclerotic focus within the left femoral head/neck corresponding to an area of abnormal uptake on prior bone scan compatible with sclerotic metastasis.  The areas of abnormal uptake within the left sacroiliac region not visualized on today's radiographs.  No fracture, subluxation or dislocation.  Hip joints are maintained and unremarkable.  IMPRESSION: Sclerotic focus within the left femoral head/neck compatible with bone metastasis.  No additional visible bony abnormality.   Original Report Authenticated By: Cyndie Chime, M.D.    Ct Abdomen Pelvis W Contrast  04/23/2012  *RADIOLOGY REPORT*  Clinical Data: Abdominal pain with vomiting.  Breast cancer.  CT ABDOMEN AND PELVIS WITH CONTRAST  Technique:  Multidetector CT imaging of the abdomen and pelvis was performed following the standard protocol during bolus administration of intravenous contrast.  Contrast: OMNIPAQUE IOHEXOL 300 MG/ML  SOLN  Comparison: Abdominal pelvic CT 04/07/2012.  Findings: Small bilateral pleural effusions have  enlarged.  The lung bases are clear.  A metastasis centrally in the right hepatic lobe adjacent to the IVC is best seen on the delayed images and has slightly enlarged, measuring 3.5 cm in diameter.  The liver otherwise appears normal. The gallbladder and biliary system appear normal.  There is new ill- defined enlargement of the pancreatic tail with mild surrounding soft tissue swelling and ill-defined fluid.  In this patient with a mildly elevated lipase level, these findings are supportive of acute pancreatitis.  The splenic and portal veins are patent.  The spleen and adrenal glands appear normal.  There is no hydronephrosis.  Scattered prominent retroperitoneal and mesenteric lymph nodes are stable.  There is also stable mild peritoneal nodularity.  The patient has developed a small amount  of pelvic ascites.  Central low density anteriorly in the uterus likely represents a fibroid. There is no discrete adnexal mass.  Widespread osseous metastatic disease is again demonstrated with pathologic rib fractures and several Schmorl's nodes.  There is a pathologic fracture through the right L4 lamina and the right L5 pars interarticularis.   No significant epidural tumor is identified. There is a sclerotic metastasis involving the left femoral head and neck.  There are lytic metastases involving the right acetabulum.  IMPRESSION:  1.  New enlargement of the pancreatic body and tail with surrounding edema consistent with acute pancreatitis. 2.  Increased pelvic ascites. 3.  Widespread osseous metastatic disease with scattered pathologic fractures as described.  No significant epidural tumor identified. 4.  Hepatic metastasis appears slightly larger.   Original Report Authenticated By: Gerrianne Scale, M.D.    Ct Abdomen Pelvis W Contrast  04/07/2012  **ADDENDUM** CREATED: 04/07/2012 02:52:52  In addition to the findings described in the original report, there are peritoneal nodules that have increased in size or  developed in the interval, concerning for metastatic disease.  The largest is within the abdomen anteriorly measuring 1.8 cm on series 2 image 33.  Additional smaller lesions for example posterior to the right hepatic lobe on series 2 image 24.  There is mild fullness of the right renal collecting system and proximal ureter with abrupt transition just below the UPJ.  This is nonspecific with symmetric renal enhancement and excretion however a urothelial or occult extrinsic lesion versus stricture is not excluded. Recommend correlation with creatinine and urologic consultation or attention at follow-up.  Discussed via telephone with Dr. Hyacinth Meeker at 02:55 a.m. on 04/07/2012.  **END ADDENDUM** SIGNED BY: Waneta Martins, M.D.   04/07/2012  *RADIOLOGY REPORT*  Clinical Data: Lower abdominal pain, nausea.  Breast cancer with known metastatic disease.  CT ABDOMEN AND PELVIS WITH CONTRAST  Technique:  Multidetector CT imaging of the abdomen and pelvis was performed following the standard protocol during bolus administration of intravenous contrast.  Contrast: OMNIPAQUE IOHEXOL 300 MG/ML  SOLN  Comparison: 02/21/2012 chest CT  Findings: Limited images through the lung bases demonstrate relative hyperlucency of the left lung base, similar to prior. There is trace left pleural effusion.  Mild right lung base opacity.  Heart size within normal limits.  There is a hypodense lesion within the medial aspect of the right hepatic lobe, abutting the IVC, which has increased in size in the interval, measuring up to 3.2 cm.  Unremarkable spleen, pancreas, biliary system, adrenal glands, kidneys.  No hydronephrosis or hydroureter.  No bowel obstruction.  No CT evidence for colitis.  No right lower quadrant inflammation.  No free intraperitoneal air or fluid.  There is scattered atherosclerotic calcification of the aorta and its branches. No aneurysmal dilatation.  Lobular uterus with heterogeneous enhancement, nonspecific.  Smaller of free fluid within the pelvis.  Nonspecific appearance to the right adnexa.  Circumferential bladder wall thickening.  Evidence of prior posterior eleventh rib fractures with callous formation.  Sclerotic lesion within the L1 vertebral body as well as within the sacrum and pelvis. Mild T12 compression is similar to prior. Multiple vertebral body lesions noted on recent MRI.  IMPRESSION: Interval disease progression with increase in size of a hepatic lesion as described above.  Osseous metastases.  Nonspecific appearance to the uterus and adnexa.   Original Report Authenticated By: Waneta Martins, M.D.     Scheduled Meds:   . feeding supplement  1  Container Oral BID BM  . fluconazole  100 mg Oral Daily  . influenza  inactive virus vaccine  0.5 mL Intramuscular Tomorrow-1000  . levothyroxine  150 mcg Oral QAC breakfast  . LORazepam  0.5 mg Oral Daily  . venlafaxine XR  150 mg Oral Daily  . DISCONTD: tamoxifen  20 mg Oral Daily   Continuous Infusions:   . sodium chloride 150 mL/hr at 04/25/12 0554    Principal Problem:  *Pancreatitis, acute Active Problems:  Breast cancer metastasized to multiple sites  Anemia    Time spent: 40 minutes   [facility]  [facility] Pager [phone]. If 8PM-8AM, please contact night-coverage at [email or web address], password [facility] 04/25/2012, 9:04 AM  LOS: 2 days

## 2012-04-25 NOTE — Progress Notes (Signed)
Pt. Has been advised that to be able to progress towards discharge that it would be advisable to start taking PO dilaudid over the IV dilaudid.  Pt. States that she does not want to take the PO medication and would rather take the IV version.

## 2012-04-26 ENCOUNTER — Ambulatory Visit
Admit: 2012-04-26 | Discharge: 2012-04-26 | Disposition: A | Discharge status: Home / Self Care | Payer: Medicare Other | Attending: Radiation Oncology | Admitting: Radiation Oncology

## 2012-04-26 DIAGNOSIS — F329 Major depressive disorder, single episode, unspecified: Secondary | ICD-10-CM

## 2012-04-26 LAB — GLUCOSE, CAPILLARY
Glucose-Capillary: 82 mg/dL (ref 70–99)
Glucose-Capillary: 113 mg/dL — ABNORMAL HIGH (ref 70–99)

## 2012-04-26 MED ORDER — OXYCODONE HCL 5 MG PO TABS
5.0000 mg | ORAL_TABLET | Freq: Four times a day (QID) | ORAL | Status: DC | PRN
  Administered 2012-04-26 – 2012-04-27 (×3): 5 mg via ORAL
  Filled 2012-04-26 (×2): qty 1

## 2012-04-26 MED ORDER — POLYETHYLENE GLYCOL 3350 17 G PO PACK
17.0000 g | PACK | Freq: Every day | ORAL | Status: DC
  Administered 2012-04-26 – 2012-04-27 (×2): 17 g via ORAL
  Filled 2012-04-26 (×2): qty 1

## 2012-04-26 MED ORDER — POTASSIUM CHLORIDE 10 MEQ/100ML IV SOLN
10.0000 meq | INTRAVENOUS | Status: AC
  Administered 2012-04-26 (×3): 10 meq via INTRAVENOUS
  Filled 2012-04-26 (×3): qty 100

## 2012-04-26 MED ORDER — HYDROMORPHONE HCL PF 2 MG/ML IJ SOLN
2.0000 mg | INTRAMUSCULAR | Status: DC | PRN
  Administered 2012-04-26: 2 mg via INTRAVENOUS
  Filled 2012-04-26: qty 1

## 2012-04-26 MED ORDER — DOCUSATE SODIUM 100 MG PO CAPS
100.0000 mg | ORAL_CAPSULE | Freq: Two times a day (BID) | ORAL | Status: DC
  Administered 2012-04-26 – 2012-04-27 (×2): 100 mg via ORAL
  Filled 2012-04-26 (×4): qty 1

## 2012-04-26 NOTE — Progress Notes (Signed)
[facility] PROGRESS NOTE  Kristin [name] WUJ:[id number] DOB: April 07, 1966 DOA: 04/23/2012 PCP: [name], MD  Brief narrative: 46 year old female admitted with working diagnosis of acute pancreatitis.  Assessment/Plan:  Principal Problem:  *Pancreatitis, acute - per oncology d/ced dilaudid and intensified bowel regimen - lipase within normal limits today - advanced diet as tolerated  Active Problems:  Breast cancer metastasized to multiple sites - management per oncology - appointment at cancer center 10/1  Hypokalemia - repleted - follow up BMP in am   Anemia - of chronic disease secondary to malignancy - no signs of active bleed  Code Status: full code Family Communication: updated at bedside Disposition Plan: likely home in next 24 hours with [facility] RN, PT, aide  Manson Passey, MD  [facility] Pager [phone]  If 7PM-7AM, please contact night-coverage [email or web address] Password [email or web address] 04/26/2012, 12:37 PM   LOS: 3 days   Consultants:  Oncology  Procedures:  none  Antibiotics:  none  HPI/Subjective: Still with persistent pain somewhat managed with current analgesics.  Objective: Filed Vitals:   04/25/12 0636 04/25/12 1419 04/25/12 2208 04/26/12 0552  BP: 158/93 154/88 148/96 141/80  Pulse: 76 83 71 78  Temp: 98.3 F (36.8 C) 97.5 F (36.4 C) 98.3 F (36.8 C) 98.6 F (37 C)  TempSrc: Oral Oral Oral Oral  Resp: 16 20 18 16   Height:      Weight:    68.493 kg (151 lb)  SpO2: 96% 100% 100% 100%    Intake/Output Summary (Last 24 hours) at 04/26/12 1237 Last data filed at 04/26/12 0900  Gross per 24 hour  Intake    480 ml  Output   2350 ml  Net  -1870 ml    Exam:   General:  Pt is alert, follows commands appropriately, not in acute distress  Cardiovascular: Regular rate and rhythm, S1/S2, no murmurs, no rubs, no gallops  Respiratory: Clear to auscultation bilaterally, no wheezing, no crackles, no rhonchi  Abdomen: Soft, tender over mid  abdomen,, non distended, bowel sounds present, no guarding  Extremities: No edema, pulses DP and PT palpable bilaterally  Neuro: Grossly nonfocal  Data Reviewed: Basic Metabolic Panel:  Lab 04/25/12 8657 04/24/12 0400 04/23/12 1800 04/22/12 1952  NA 137 137 135 136  K 3.3* 3.6 3.9 3.6  CL 104 104 102 101  CO2 27 27 24 26   GLUCOSE 122* 73 93 148*  BUN 4* 6 8 10   CREATININE 0.77 0.77 0.76 0.74  CALCIUM 8.5 9.0 9.0 9.6  MG -- -- -- --  PHOS -- -- -- --   Liver Function Tests:  Lab 04/25/12 0330 04/24/12 0400 04/23/12 1800 04/22/12 1952  AST 15 15 17 18   ALT 11 11 12 12   ALKPHOS 65 71 74 87  BILITOT 0.2* 0.3 0.2* 0.3  PROT 5.9* 6.6 7.0 7.5  ALBUMIN 2.6* 2.8* 3.1* 3.3*    Lab 04/25/12 0330 04/23/12 1800 04/22/12 1952  LIPASE 57 61* 45  AMYLASE 115* -- --   CBC:  Lab 04/25/12 0330 04/24/12 0400 04/23/12 1800 04/22/12 1952  WBC 5.5 5.4 6.5 6.8  HGB 8.8* 9.3* 10.2* 10.6*  HCT 26.6* 28.9* 31.1* 32.1*  MCV 77.1* 77.1* 76.2* 75.7*  PLT 319 310 365 362   CBG:  Lab 04/26/12 1150 04/26/12 0733 04/25/12 2204 04/25/12 1649 04/25/12 1143  GLUCAP 113* 82 113* 90 82    URINE CULTURE     Status: Normal   Collection Time   04/18/12  2:49 AM  Component Value Range Status Comment   Specimen Description URINE, CATH  Final    Value: Multiple bacterial morphotypes present, none predominant. Suggest appropriate recollection if clinically indicated.   Report Status 04/19/2012 FINAL   Final      Studies: Mr Abdomen W Wo Contrast 04/25/2012   IMPRESSION: Severely motion degraded images.  Peripancreatic inflammatory changes/edema, reflecting known acute pancreatitis.  No underlying etiology is evident on the current study.  Hepatic and osseous metastases.    Scheduled Meds:   . docusate sodium  100 mg Oral BID  . feeding supplement  1 Container Oral BID BM  . fluconazole  100 mg Oral Daily  . levothyroxine  150 mcg Oral QAC breakfast  . LORazepam  0.5 mg Oral Daily  .  polyethylene glycol  17 g Oral Daily  . potassium chloride  10 mEq Intravenous Q1 Hr x 3  . venlafaxine XR  150 mg Oral Daily   Continuous Infusions:   . 0.9 % NaCl with KCl 20 mEq / L 125 mL/hr at 04/26/12 0335

## 2012-04-26 NOTE — Progress Notes (Signed)
Inpatient followup note:   CC: Dr. 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 Marikay Alar Magrinat  History: Ms. Kristin [name] is a pleasant female who is seen today for the courtesy of Dr. Marikay Alar Magrinat for consideration of palliative radiotherapy to her right hip in the management of her metastatic carcinoma breast to bone. She is currently hospitalized for acute pancreatitis. Breast MR from 04/24/2012 shows peripancreatic inflammatory changes consistent with acute pancreatitis. Hepatic and osseous metastases were seen. She has a history of right hip discomfort which occasionally radiates to her right thigh. She denies low back pain. Her CT scan the abdomen/pelvis on 04/23/2012 showed widespread osseous metastatic disease with a pathologic fracture to the right L4 lamina and the right L5 pars interarticularis sclerotic metastasis is seen involving the left femoral head/neck along with lytic disease involving the right acetabulum. She states that her pain is worse with sitting for long periods of time. She has a history of palliative radiotherapy delivered to the thoracic spine, left breast and regional lymph nodes, and LS spine.  Physical examination: Alert and oriented. Wt Readings from Last 3 Encounters:  04/26/12 151 lb (68.493 kg)  03/31/12 154 lb 12.8 oz (70.217 kg)  03/14/12 159 lb 1.6 oz (72.167 kg)   Temp Readings from Last 3 Encounters:  04/26/12 98.6 F (37 C)   04/22/12 97.7 F (36.5 C) Oral  04/17/12 97.8 F (36.6 C) Oral   BP Readings from Last 3 Encounters:  04/26/12 142/89  04/22/12 138/88  04/17/12 177/102   Pulse Readings from Last 3 Encounters:  04/26/12 63  04/22/12 99  04/17/12 100   Back: There is no palpable thoracic, lumbar, or sacral spine discomfort. There is pain described along the right acetabulum on deep palpation. Right hip range of motion not tested. She does have  hip pain with straight leg raising on the right. Neurologic examination grossly nonfocal.  Laboratory data:  Lab Results  Component Value Date   WBC 5.5 04/25/2012   HGB 8.8* 04/25/2012   HCT 26.6* 04/25/2012   MCV 77.1* 04/25/2012   PLT 319 04/25/2012    Impression: Metastatic carcinoma breast was without involvement of the right acetabulum. After that she would benefit from 3 fractions of radiation therapy. The treatment should be well tolerated. I will try to have her visit [place] tomorrow for simulation/treatment planning and begin her region therapy this Friday.

## 2012-04-26 NOTE — Progress Notes (Signed)
Kristin [name]   DOB:[birth year]   ZO#:[id number]   JWJ#:[id number]  Subjective: Kristin [name] is feeling better; pain is very inconstant; she denies nausea or vomiting with food; no BMs in several days; ambulated "a lot" yesterday; mother in room  Objective: middle aged African Amrican woman examined sitting up at edge of bed Filed Vitals:   04/26/12 0552  BP: 141/80  Pulse: 78  Temp: 98.6 F (37 C)  Resp: 16    Body mass index is 22.30 kg/(m^2).  Intake/Output Summary (Last 24 hours) at 04/26/12 0757 Last data filed at 04/26/12 0500  Gross per 24 hour  Intake    480 ml  Output   2950 ml  Net  -2470 ml     Sclerae unicteric  Oropharynx clear  No peripheral adenopathy  Lungs clear -- no rales or rhonchi  Heart regular rate and rhythm  Abdomen soft, +BS, no palpable tenderness LUQ, no masses palpated  MSK no peripheral edema  Neuro nonfocal  Breast exam:deferred  CBG (last 3)   Basename 04/25/12 1649 04/25/12 1143 04/25/12 0734  GLUCAP 90 82 91     Labs:  Lab Results  Component Value Date   WBC 5.5 04/25/2012   HGB 8.8* 04/25/2012   HCT 26.6* 04/25/2012   MCV 77.1* 04/25/2012   PLT 319 04/25/2012   NEUTROABS 3.9 04/24/2012    Urine Studies No results found for this basename: UACOL:2,UAPR:2,USPG:2,UPH:2,UTP:2,UGL:2,UKET:2,UBIL:2,UHGB:2,UNIT:2,UROB:2,ULEU:2,UEPI:2,UWBC:2,URBC:2,UBAC:2,CAST:2,CRYS:2,UCOM:2,BILUA:2 in the last 72 hours  Basic Metabolic Panel:  Lab 04/25/12 6213 04/24/12 0400 04/23/12 1800 04/22/12 1952  NA 137 137 135 136  K 3.3* 3.6 -- --  CL 104 104 102 101  CO2 27 27 24 26   GLUCOSE 122* 73 93 148*  BUN 4* 6 8 10   CREATININE 0.77 0.77 0.76 0.74  CALCIUM 8.5 9.0 9.0 9.6  MG -- -- -- --  PHOS -- -- -- --   GFR Estimated Creatinine Clearance: 91.8 ml/min (by C-G formula based on Cr of 0.77). Liver Function Tests:  Lab 04/25/12 0330 04/24/12 0400 04/23/12 1800 04/22/12 1952  AST 15 15 17 18   ALT 11 11 12 12   ALKPHOS 65 71 74 87  BILITOT 0.2* 0.3  0.2* 0.3  PROT 5.9* 6.6 7.0 7.5  ALBUMIN 2.6* 2.8* 3.1* 3.3*    Lab 04/25/12 0330 04/23/12 1800 04/22/12 1952  LIPASE 57 61* 45  AMYLASE 115* -- --   No results found for this basename: AMMONIA:5 in the last 168 hours Coagulation profile  Lab 04/23/12 1800  INR 1.11  PROTIME --    CBC:  Lab 04/25/12 0330 04/24/12 0400 04/23/12 1800 04/22/12 1952  WBC 5.5 5.4 6.5 6.8  NEUTROABS -- 3.9 5.3 --  HGB 8.8* 9.3* 10.2* 10.6*  HCT 26.6* 28.9* 31.1* 32.1*  MCV 77.1* 77.1* 76.2* 75.7*  PLT 319 310 365 362   Cardiac Enzymes: No results found for this basename: CKTOTAL:5,CKMB:5,CKMBINDEX:5,TROPONINI:5 in the last 168 hours BNP: No components found with this basename: POCBNP:5 CBG:  Lab 04/25/12 1649 04/25/12 1143 04/25/12 0734 04/24/12 1957 04/24/12 1550  GLUCAP 90 82 91 137* 92   D-Dimer No results found for this basename: DDIMER:2 in the last 72 hours Hgb A1c No results found for this basename: HGBA1C:2 in the last 72 hours Lipid Profile  Basename 04/24/12 0400  CHOL 130  HDL 52  LDLCALC 61  TRIG 86  CHOLHDL 2.5  LDLDIRECT --   Thyroid function studies  Basename 04/24/12 0400  TSH 22.225*  T4TOTAL --  T3FREE --  THYROIDAB --   Anemia work up  [employer] 04/25/12 0330  VITAMINB12 --  FOLATE --  FERRITIN 247  TIBC --  IRON --  RETICCTPCT 1.1   Microbiology Recent Results (from the past 240 hour(s))  URINE CULTURE     Status: Normal   Collection Time   04/18/12  2:49 AM      Component Value Range Status Comment   Specimen Description URINE, CATHETERIZED   Final    Special Requests NONE   Final    Culture  Setup Time 04/18/2012 08:00   Final    Colony Count 20,OOO COLONIES/ML   Final    Culture     Final    Value: Multiple bacterial morphotypes present, none predominant. Suggest appropriate recollection if clinically indicated.   Report Status 04/19/2012 FINAL   Final       Studies:  Mr Abdomen W Wo Contrast  04/25/2012  *RADIOLOGY REPORT*   Clinical Data: Pancreatitis, abdominal pain, history of metastatic breast cancer  MRI ABDOMEN WITH AND WITHOUT CONTRAST  Technique:  Multiplanar multisequence MR imaging of the abdomen was performed both before and after administration of intravenous contrast.  Contrast: 14mL MULTIHANCE GADOBENATE DIMEGLUMINE 529 MG/ML IV SOLN  Comparison: CT abdomen pelvis dated 04/23/2012  Findings: Severely motion degraded images.  3.3 x 2.0 cm enhancing metastasis in the right hepatic lobe, just posterior to the IVC (series 3/image 18), unchanged from recent CT. No hepatic steatosis.  Peripancreatic inflammatory changes/edema, most prominently adjacent to the pancreatic body/tail, reflecting known acute pancreatitis.  No associated pancreatic ductal dilatation.  Spleen and adrenal glands are within normal limits.  Gallbladder is unremarkable.  No intrahepatic or extrahepatic ductal dilatation.  No cholelithiasis or choledocholithiasis is seen.  Kidneys are unremarkable.  No hydronephrosis.  Known prominent upper abdominal/retroperitoneal lymph nodes are better demonstrated on recent CT.  Multifocal osseous metastases.  IMPRESSION: Severely motion degraded images.  Peripancreatic inflammatory changes/edema, reflecting known acute pancreatitis.  No underlying etiology is evident on the current study.  Hepatic and osseous metastases.   Original Report Authenticated By: Charline Bills, M.D.     Assessment: 46 y.o. [place] woman with history of breast cancer stage IV at diagnosis February 2011, with metastases to bone, liver, abdominal/retroperitoneal nodes, admitted with pancreatitis  (1) status post 30 Gy to T-3 through T-18 October 2009  (2) status post docetaxel, cyclophosphamide and doxorubicin x6, completed in June 2011.  (3) status post Left lumpectomy and sentinel lymph node biopsy August 2011 for a 2-mm focus of residual invasive ductal carcinoma, grade 2, negative sentinel lymph node  (4) tamoxifen started July  2011, with evidence of progression March 2013  (5) status post radiation therapy to Left breast completed November 2011  (6) s/p radiation to the lower spine, completed 11/15/2011  (7) fulvestrant/goserelin started 11/03/2011  (8) continuing on zolendronic acid monthly  Plan: MRI was done yesterday and confirms pancreatitis but does not suggest a specific reason; liver lesion at 3.3 cm an solitary; no hepatic steatosis  Patient and family now seem to understand better why she is not a candidate for hospice. They are interested in home health and an aide to help patient after discharge.  As far as her breast cancer is concerned, I have asked Dr Dayton Scrape to evaluate for possible palliative irradiation to the Left hip. She will see me OCT 1 to discuss changes to her systemic therapy.  I have d/c'd IV dilaudid, started PRN oxycodone, advanced diet and started her prophylactic bowel program.  If patient is able to eat a regular diet with good pain control and BMs on this regimen, and her home supports are in place, perhaps she will be able to be discharged tomorrow.  Will sign off at this point. Please let me know if I can be of further help at this point.   Kristin [name] C 04/26/2012

## 2012-04-27 ENCOUNTER — Ambulatory Visit
Admit: 2012-04-27 | Discharge: 2012-04-27 | Disposition: A | Discharge status: Home / Self Care | Payer: Medicare Other | Attending: Radiation Oncology | Admitting: Radiation Oncology

## 2012-04-27 ENCOUNTER — Telehealth: Payer: Self-pay | Admitting: *Deleted

## 2012-04-27 DIAGNOSIS — C7951 Secondary malignant neoplasm of bone: Secondary | ICD-10-CM | POA: Insufficient documentation

## 2012-04-27 DIAGNOSIS — C50919 Malignant neoplasm of unspecified site of unspecified female breast: Secondary | ICD-10-CM | POA: Insufficient documentation

## 2012-04-27 DIAGNOSIS — Z510 Encounter for antineoplastic radiation therapy: Secondary | ICD-10-CM | POA: Insufficient documentation

## 2012-04-27 DIAGNOSIS — C7952 Secondary malignant neoplasm of bone marrow: Secondary | ICD-10-CM | POA: Insufficient documentation

## 2012-04-27 LAB — BASIC METABOLIC PANEL
Calcium: 9.4 mg/dL (ref 8.4–10.5)
Chloride: 102 mEq/L (ref 96–112)
Creatinine, Ser: 0.78 mg/dL (ref 0.50–1.10)
GFR calc non Af Amer: >90 mL/min (ref >90)
Potassium: 4 mEq/L (ref 3.5–5.1)
Sodium: 135 mEq/L (ref 135–145)

## 2012-04-27 MED ORDER — OXYCODONE-ACETAMINOPHEN 5-325 MG PO TABS: 2.0000 | ORAL_TABLET | ORAL | Status: AC | PRN

## 2012-04-27 MED ORDER — HYDROMORPHONE HCL 2 MG PO TABS
2.0000 mg | ORAL_TABLET | ORAL | Status: DC | PRN
  Administered 2012-04-27 (×2): 2 mg via ORAL
  Filled 2012-04-27 (×2): qty 1

## 2012-04-27 MED ORDER — OXYCODONE HCL 5 MG PO TABS
ORAL_TABLET | ORAL | Status: AC
  Administered 2012-04-27: 02:00:00
  Filled 2012-04-27: qty 1

## 2012-04-27 MED ORDER — HYDROMORPHONE HCL 2 MG PO TABS: 2.0000 mg | ORAL_TABLET | ORAL | Status: DC | PRN

## 2012-04-27 NOTE — Progress Notes (Signed)
Pt refused 0800 POCT CBG.  IV DC'd d/t leaking; pt being DC'd later today.

## 2012-04-27 NOTE — Discharge Summary (Signed)
Physician Discharge Summary  Kristin [name] ZOX:[id number] DOB: 18-Apr-1966 DOA: 04/23/2012  PCP: [name], MD  Admit date: 04/23/2012 Discharge date: 04/27/2012  Recommendations for Outpatient Follow-up:  1. Oncology appt 05/09/2012  Discharge Diagnoses:  Principal Problem:  *Pancreatitis, acute Active Problems:  Breast cancer metastasized to multiple sites  Anemia  Discharge Condition: medically stable for discharge home today  Diet recommendation: regular  History of present illness:  46 year old female admitted with working diagnosis of acute pancreatitis.   Assessment/Plan:   Principal Problem:  *Pancreatitis, acute  - prescriptions for pain meds provided - lipase within normal limits  - advanced diet to regular and patient tolerated it well  Active Problems:  Breast cancer metastasized to multiple sites  - management per oncology  - appointment at cancer center 10/1  Hypokalemia  - repleted  - resolved Anemia  - of chronic disease secondary to malignancy  - no signs of active bleed   Code Status: full code  Family Communication: updated at bedside  Disposition Plan: home today  Manson Passey, MD  [facility]  Pager [phone]   Discharge Exam: Filed Vitals:   04/27/12 0501  BP: 147/90  Pulse: 90  Temp: 98.7 F (37.1 C)  Resp: 18   Filed Vitals:   04/26/12 0552 04/26/12 1407 04/26/12 2104 04/27/12 0501  BP: 141/80 142/89 156/86 147/90  Pulse: 78 63 98 90  Temp: 98.6 F (37 C) 98.6 F (37 C) 98.3 F (36.8 C) 98.7 F (37.1 C)  TempSrc: Oral  Oral Oral  Resp: 16 18 18 18   Height:      Weight: 68.493 kg (151 lb)     SpO2: 100% 100% 98% 96%    General: Pt is alert, follows commands appropriately, not in acute distress Cardiovascular: Regular rate and rhythm, S1/S2 +, no murmurs, no rubs, no gallops Respiratory: Clear to auscultation bilaterally, no wheezing, no crackles, no rhonchi Abdominal: Soft, non tender, non distended, bowel sounds +,  no guarding Extremities: no edema, no cyanosis, pulses palpable bilaterally DP and PT Neuro: Grossly nonfocal  Discharge Instructions  Discharge Orders    Future Appointments: Provider: Department: Dept Phone: Center:   04/28/2012 3:50 PM [facility] [id number] [facility] [phone] None   05/01/2012 3:50 PM [facility] [id number] [facility] [phone] None   05/02/2012 9:35 AM [facility] [id number] [facility] [phone] None   05/09/2012 1:30 PM Beverely Pace Shumate [facility] [phone] None   05/09/2012 1:45 PM Amy Allegra Grana, PA [facility] [phone] None   05/09/2012 2:30 PM [facility] A3 [facility] [phone] None   06/06/2012 1:30 PM Beverely Pace Shumate [facility] [phone] None   06/06/2012 1:45 PM Amy Allegra Grana, PA [facility] [phone] None   06/06/2012 2:30 PM [facility] D13 [facility] [phone] None   06/27/2012 11:30 AM Dava Najjar Idelle Jo [facility] [phone] None   06/27/2012 12:00 PM [name], MD [facility] [phone] None   07/04/2012 1:30 PM Beverely Pace Shumate [facility] [phone] None   07/04/2012 2:00 PM [facility] A2 [facility] [phone] None   07/25/2012 11:00 AM Windell Hummingbird [facility] [phone] None   08/01/2012 11:15 AM Dava Najjar Idelle Jo [facility] [phone] None   08/01/2012 11:45 AM [facility] A1 [facility] [phone] None   08/29/2012 11:00 AM Windell Hummingbird [facility] [phone] None   09/26/2012 11:00 AM Windell Hummingbird [facility] [phone] None       Medication List     As of 04/27/2012  1:00 PM  TAKE these medications         diphenhydrAMINE 25 MG tablet   Commonly known as: BENADRYL   Take 25 mg by mouth every 6 (six) hours as needed.      fluconazole 100 MG tablet   Commonly known as: DIFLUCAN   Take 100 mg by mouth daily.      HYDROmorphone 2 MG tablet   Commonly known as:  DILAUDID   Take 1 tablet (2 mg total) by mouth every 4 (four) hours as needed for pain. For pain      levothyroxine 150 MCG tablet   Commonly known as: SYNTHROID, LEVOTHROID   Take 150 mcg by mouth daily.      LORazepam 0.5 MG tablet   Commonly known as: ATIVAN   Take 0.5 mg by mouth daily. For anxiety      metoCLOPramide 10 MG tablet   Commonly known as: REGLAN   Take 10 mg by mouth every 6 (six) hours. Take with Benadryl at bedtime      ondansetron 8 MG tablet   Commonly known as: ZOFRAN   Take 8 mg by mouth every 8 (eight) hours as needed. Nausea      oxyCODONE-acetaminophen 5-325 MG per tablet   Commonly known as: PERCOCET/ROXICET   Take 2 tablets by mouth every 4 (four) hours as needed. Pain      promethazine 25 MG tablet   Commonly known as: PHENERGAN   Take 25 mg by mouth every 6 (six) hours as needed. Nausea      tamoxifen 20 MG tablet   Commonly known as: NOLVADEX   Take 20 mg by mouth daily.      traMADol 50 MG tablet   Commonly known as: ULTRAM   Take 1 tablet (50 mg total) by mouth every 6 (six) hours as needed. For pain      venlafaxine XR 75 MG 24 hr capsule   Commonly known as: EFFEXOR-XR   Take 2 capsules (150 mg total) by mouth daily.          The results of significant diagnostics from this hospitalization (including imaging, microbiology, ancillary and laboratory) are listed below for reference.    Significant Diagnostic Studies: Dg Hip Complete Right  03/31/2012  *RADIOLOGY REPORT*  Clinical Data: Breast cancer.  Right hip pain.  RIGHT HIP - COMPLETE 2+ VIEW  Comparison: 10/19/2011 bone scan.  Findings: Sclerotic focus within the left femoral head/neck corresponding to an area of abnormal uptake on prior bone scan compatible with sclerotic metastasis.  The areas of abnormal uptake within the left sacroiliac region not visualized on today's radiographs.  No fracture, subluxation or dislocation.  Hip joints are maintained and unremarkable.   IMPRESSION: Sclerotic focus within the left femoral head/neck compatible with bone metastasis.  No additional visible bony abnormality.   Original Report Authenticated By: Cyndie Chime, M.D.    Mr Abdomen W Wo Contrast  04/25/2012  *RADIOLOGY REPORT*  Clinical Data: Pancreatitis, abdominal pain, history of metastatic breast cancer  MRI ABDOMEN WITH AND WITHOUT CONTRAST  Technique:  Multiplanar multisequence MR imaging of the abdomen was performed both before and after administration of intravenous contrast.  Contrast: 14mL MULTIHANCE GADOBENATE DIMEGLUMINE 529 MG/ML IV SOLN  Comparison: CT abdomen pelvis dated 04/23/2012  Findings: Severely motion degraded images.  3.3 x 2.0 cm enhancing metastasis in the right hepatic lobe, just posterior to the IVC (series 3/image 18), unchanged from recent CT. No hepatic steatosis.  Peripancreatic inflammatory changes/edema, most prominently  adjacent to the pancreatic body/tail, reflecting known acute pancreatitis.  No associated pancreatic ductal dilatation.  Spleen and adrenal glands are within normal limits.  Gallbladder is unremarkable.  No intrahepatic or extrahepatic ductal dilatation.  No cholelithiasis or choledocholithiasis is seen.  Kidneys are unremarkable.  No hydronephrosis.  Known prominent upper abdominal/retroperitoneal lymph nodes are better demonstrated on recent CT.  Multifocal osseous metastases.  IMPRESSION: Severely motion degraded images.  Peripancreatic inflammatory changes/edema, reflecting known acute pancreatitis.  No underlying etiology is evident on the current study.  Hepatic and osseous metastases.   Original Report Authenticated By: Charline Bills, M.D.    Ct Abdomen Pelvis W Contrast  04/23/2012  *RADIOLOGY REPORT*  Clinical Data: Abdominal pain with vomiting.  Breast cancer.  CT ABDOMEN AND PELVIS WITH CONTRAST  Technique:  Multidetector CT imaging of the abdomen and pelvis was performed following the standard protocol during bolus  administration of intravenous contrast.  Contrast: OMNIPAQUE IOHEXOL 300 MG/ML  SOLN  Comparison: Abdominal pelvic CT 04/07/2012.  Findings: Small bilateral pleural effusions have enlarged.  The lung bases are clear.  A metastasis centrally in the right hepatic lobe adjacent to the IVC is best seen on the delayed images and has slightly enlarged, measuring 3.5 cm in diameter.  The liver otherwise appears normal. The gallbladder and biliary system appear normal.  There is new ill- defined enlargement of the pancreatic tail with mild surrounding soft tissue swelling and ill-defined fluid.  In this patient with a mildly elevated lipase level, these findings are supportive of acute pancreatitis.  The splenic and portal veins are patent.  The spleen and adrenal glands appear normal.  There is no hydronephrosis.  Scattered prominent retroperitoneal and mesenteric lymph nodes are stable.  There is also stable mild peritoneal nodularity.  The patient has developed a small amount of pelvic ascites.  Central low density anteriorly in the uterus likely represents a fibroid. There is no discrete adnexal mass.  Widespread osseous metastatic disease is again demonstrated with pathologic rib fractures and several Schmorl's nodes.  There is a pathologic fracture through the right L4 lamina and the right L5 pars interarticularis.   No significant epidural tumor is identified. There is a sclerotic metastasis involving the left femoral head and neck.  There are lytic metastases involving the right acetabulum.  IMPRESSION:  1.  New enlargement of the pancreatic body and tail with surrounding edema consistent with acute pancreatitis. 2.  Increased pelvic ascites. 3.  Widespread osseous metastatic disease with scattered pathologic fractures as described.  No significant epidural tumor identified. 4.  Hepatic metastasis appears slightly larger.   Original Report Authenticated By: Gerrianne Scale, M.D.    Ct Abdomen Pelvis W  Contrast  04/07/2012  **ADDENDUM** CREATED: 04/07/2012 02:52:52  In addition to the findings described in the original report, there are peritoneal nodules that have increased in size or developed in the interval, concerning for metastatic disease.  The largest is within the abdomen anteriorly measuring 1.8 cm on series 2 image 33.  Additional smaller lesions for example posterior to the right hepatic lobe on series 2 image 24.  There is mild fullness of the right renal collecting system and proximal ureter with abrupt transition just below the UPJ.  This is nonspecific with symmetric renal enhancement and excretion however a urothelial or occult extrinsic lesion versus stricture is not excluded. Recommend correlation with creatinine and urologic consultation or attention at follow-up.  Discussed via telephone with Dr. Hyacinth Meeker at 02:55 a.m. on 04/07/2012.  **  END ADDENDUM** SIGNED BY: Waneta Martins, M.D.   04/07/2012  *RADIOLOGY REPORT*  Clinical Data: Lower abdominal pain, nausea.  Breast cancer with known metastatic disease.  CT ABDOMEN AND PELVIS WITH CONTRAST  Technique:  Multidetector CT imaging of the abdomen and pelvis was performed following the standard protocol during bolus administration of intravenous contrast.  Contrast: OMNIPAQUE IOHEXOL 300 MG/ML  SOLN  Comparison: 02/21/2012 chest CT  Findings: Limited images through the lung bases demonstrate relative hyperlucency of the left lung base, similar to prior. There is trace left pleural effusion.  Mild right lung base opacity.  Heart size within normal limits.  There is a hypodense lesion within the medial aspect of the right hepatic lobe, abutting the IVC, which has increased in size in the interval, measuring up to 3.2 cm.  Unremarkable spleen, pancreas, biliary system, adrenal glands, kidneys.  No hydronephrosis or hydroureter.  No bowel obstruction.  No CT evidence for colitis.  No right lower quadrant inflammation.  No free intraperitoneal  air or fluid.  There is scattered atherosclerotic calcification of the aorta and its branches. No aneurysmal dilatation.  Lobular uterus with heterogeneous enhancement, nonspecific. Smaller of free fluid within the pelvis.  Nonspecific appearance to the right adnexa.  Circumferential bladder wall thickening.  Evidence of prior posterior eleventh rib fractures with callous formation.  Sclerotic lesion within the L1 vertebral body as well as within the sacrum and pelvis. Mild T12 compression is similar to prior. Multiple vertebral body lesions noted on recent MRI.  IMPRESSION: Interval disease progression with increase in size of a hepatic lesion as described above.  Osseous metastases.  Nonspecific appearance to the uterus and adnexa.   Original Report Authenticated By: Waneta Martins, M.D.     Microbiology: Recent Results (from the past 240 hour(s))  URINE CULTURE     Status: Normal   Collection Time   04/18/12  2:49 AM      Component Value Range Status Comment   Specimen Description URINE, CATHETERIZED   Final    Special Requests NONE   Final    Culture  Setup Time 04/18/2012 08:00   Final    Colony Count 20,OOO COLONIES/ML   Final    Culture     Final    Value: Multiple bacterial morphotypes present, none predominant. Suggest appropriate recollection if clinically indicated.   Report Status 04/19/2012 FINAL   Final      Labs: Basic Metabolic Panel:  Lab 04/27/12 1191 04/25/12 0330 04/24/12 0400 04/23/12 1800 04/22/12 1952  NA 135 137 137 135 136  K 4.0 3.3* 3.6 3.9 3.6  CL 102 104 104 102 101  CO2 26 27 27 24 26   GLUCOSE 105* 122* 73 93 148*  BUN <3* 4* 6 8 10   CREATININE 0.78 0.77 0.77 0.76 0.74  CALCIUM 9.4 8.5 9.0 9.0 9.6  MG -- -- -- -- --  PHOS -- -- -- -- --   Liver Function Tests:  Lab 04/25/12 0330 04/24/12 0400 04/23/12 1800 04/22/12 1952  AST 15 15 17 18   ALT 11 11 12 12   ALKPHOS 65 71 74 87  BILITOT 0.2* 0.3 0.2* 0.3  PROT 5.9* 6.6 7.0 7.5  ALBUMIN 2.6* 2.8*  3.1* 3.3*    Lab 04/25/12 0330 04/23/12 1800 04/22/12 1952  LIPASE 57 61* 45  AMYLASE 115* -- --   No results found for this basename: AMMONIA:5 in the last 168 hours CBC:  Lab 04/25/12 0330 04/24/12 0400 04/23/12 1800  04/22/12 1952  WBC 5.5 5.4 6.5 6.8  NEUTROABS -- 3.9 5.3 --  HGB 8.8* 9.3* 10.2* 10.6*  HCT 26.6* 28.9* 31.1* 32.1*  MCV 77.1* 77.1* 76.2* 75.7*  PLT 319 310 365 362   Cardiac Enzymes: No results found for this basename: CKTOTAL:5,CKMB:5,CKMBINDEX:5,TROPONINI:5 in the last 168 hours BNP: BNP (last 3 results) No results found for this basename: PROBNP:3 in the last 8760 hours CBG:  Lab 04/26/12 2103 04/26/12 1733 04/26/12 1150 04/26/12 0733 04/25/12 2204  GLUCAP 101* 126* 113* 82 113*    Time coordinating discharge: Over 30 minutes  Signed:  Manson Passey, MD  [facility] 04/27/2012, 1:00 PM Pager #: [phone]

## 2012-04-27 NOTE — Progress Notes (Signed)
Simulation/treatment planning note: The patient was taken to the CT simulator. A Vac lock immobilization device was constructed. Her pelvis was scanned. I chose and isocenter along the superior aspect of her acetabulum. She was set up to AP and PA fields in 2 separate multileaf collimators were designed. I prescribing 1800 cGy in 3 sessions utilizing 15 MV photons. Dosimetry and isodose plan were requested .

## 2012-04-27 NOTE — Telephone Encounter (Signed)
Called the floor, spoke with patients nurse, Rick,RN, he is aware of patient coming today for mark/start  Rad therapy, asked to medicate patient with her dilaudid prior to coming down , patient possible going home today, doing better , 9:12 AM

## 2012-04-28 ENCOUNTER — Encounter: Payer: Self-pay | Admitting: *Deleted

## 2012-04-28 ENCOUNTER — Ambulatory Visit
Admission: RE | Admit: 2012-04-28 | Discharge: 2012-04-28 | Disposition: A | Discharge status: Home / Self Care | Payer: Medicare Other | Source: Ambulatory Visit | Attending: Radiation Oncology | Admitting: Radiation Oncology

## 2012-04-28 NOTE — Progress Notes (Signed)
[facility]  Clinical Pharmacist, hospital contacted pt and pt's family at home to follow up after discharge from the hospital.  Pt's mother stated they were doing "ok", and home health and home aid services had been set up in the hospital.  Pt and family stated they have not yet been contacted by [facility].  CSW encouraged pt or family to call [facility] contact early next week if they have not been contacted.  CSW also informed pt's mother that per financial counselor pt's Medicaid was active.  Pt's mother did not express any additional concerns.  CSW encouraged pt and family to call as needs arise.    Tamala Julian, MSW, LCSW Clinical Social Worker [facility] [phone]

## 2012-04-29 ENCOUNTER — Encounter: Payer: Self-pay | Admitting: Radiation Oncology

## 2012-04-29 NOTE — Progress Notes (Signed)
Simulation verification note: The patient underwent simulation verification on 04-28-12 for treatment to her R hip. Her isocenter was in good position and the MLCs contoured the treatment volume appropriately.

## 2012-05-01 ENCOUNTER — Ambulatory Visit
Admission: RE | Admit: 2012-05-01 | Discharge: 2012-05-01 | Disposition: A | Discharge status: Home / Self Care | Payer: Medicare Other | Source: Ambulatory Visit | Attending: Radiation Oncology | Admitting: Radiation Oncology

## 2012-05-01 ENCOUNTER — Encounter: Payer: Self-pay | Admitting: Radiation Oncology

## 2012-05-01 VITALS — BP 161/98 | HR 105 | Temp 98.1°F | Resp 20 | Wt 149.3 lb

## 2012-05-01 DIAGNOSIS — C50919 Malignant neoplasm of unspecified site of unspecified female breast: Secondary | ICD-10-CM

## 2012-05-01 NOTE — Progress Notes (Signed)
Weekly Management Note:  Site:R hip Current Dose:  1200  cGy Projected Dose: 1800  cGy  Narrative: The patient is seen today for routine under treatment assessment. CBCT/MVCT images/port films were reviewed. The chart was reviewed.   Right hip pain is slightly improved.  Physical Examination:  Filed Vitals:   05/01/12 1625  BP: 161/98  Pulse: 105  Temp: 98.1 F (36.7 C)  Resp: 20  .  Weight: 149 lb 4.8 oz (67.722 kg). No change.  Impression: Tolerating radiation therapy well.  Plan: Continue radiation therapy as planned. She'll finish her radiation therapy tomorrow and see me for a one-month followup visit.

## 2012-05-01 NOTE — Progress Notes (Signed)
Patient here weekly rad tx right hip     ;2/3 completes tomorrow c/o little right hip, c/o stomach hurting this am had sausage and eggs, took prilosec and it did help,  4:27 PM

## 2012-05-02 ENCOUNTER — Encounter: Payer: Self-pay | Admitting: Radiation Oncology

## 2012-05-02 ENCOUNTER — Ambulatory Visit: Payer: Self-pay | Admitting: Physician Assistant

## 2012-05-02 ENCOUNTER — Ambulatory Visit: Payer: Self-pay

## 2012-05-02 ENCOUNTER — Other Ambulatory Visit: Payer: Self-pay | Admitting: Lab

## 2012-05-02 ENCOUNTER — Ambulatory Visit
Admission: RE | Admit: 2012-05-02 | Discharge: 2012-05-02 | Disposition: A | Discharge status: Home / Self Care | Payer: Medicare Other | Source: Ambulatory Visit | Attending: Radiation Oncology | Admitting: Radiation Oncology

## 2012-05-02 NOTE — Progress Notes (Signed)
[facility] Radiation Oncology End of Treatment Note  Name:Kristin [name]  Date: 05/02/2012 ZOX:[id number] DOB:10/14/1965   Status:outpatient    CC: [name], MD    REFERRING PHYSICIAN: Dr. 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 Searer tolerated treatment well with some improvement in her right hip pain by completion of therapy.                         PLAN: Routine followup in one month. Patient instructed to call if questions or worsening complaints in interim.

## 2012-05-09 ENCOUNTER — Ambulatory Visit (HOSPITAL_BASED_OUTPATIENT_CLINIC_OR_DEPARTMENT_OTHER): Payer: Medicare Other

## 2012-05-09 ENCOUNTER — Ambulatory Visit: Payer: Self-pay

## 2012-05-09 ENCOUNTER — Other Ambulatory Visit (HOSPITAL_BASED_OUTPATIENT_CLINIC_OR_DEPARTMENT_OTHER): Payer: Medicare Other | Admitting: Lab

## 2012-05-09 ENCOUNTER — Other Ambulatory Visit: Payer: Self-pay | Admitting: *Deleted

## 2012-05-09 ENCOUNTER — Other Ambulatory Visit: Payer: Self-pay | Admitting: Oncology

## 2012-05-09 ENCOUNTER — Ambulatory Visit (HOSPITAL_BASED_OUTPATIENT_CLINIC_OR_DEPARTMENT_OTHER): Payer: Medicare Other | Admitting: Physician Assistant

## 2012-05-09 ENCOUNTER — Encounter: Payer: Self-pay | Admitting: Physician Assistant

## 2012-05-09 VITALS — BP 112/74 | HR 84 | Temp 97.9°F | Resp 20 | Ht 69.0 in | Wt 152.2 lb

## 2012-05-09 DIAGNOSIS — C50419 Malignant neoplasm of upper-outer quadrant of unspecified female breast: Secondary | ICD-10-CM

## 2012-05-09 DIAGNOSIS — F411 Generalized anxiety disorder: Secondary | ICD-10-CM

## 2012-05-09 DIAGNOSIS — C787 Secondary malignant neoplasm of liver and intrahepatic bile duct: Secondary | ICD-10-CM

## 2012-05-09 DIAGNOSIS — F410 Panic disorder [episodic paroxysmal anxiety] without agoraphobia: Secondary | ICD-10-CM

## 2012-05-09 DIAGNOSIS — C7951 Secondary malignant neoplasm of bone: Secondary | ICD-10-CM

## 2012-05-09 DIAGNOSIS — C801 Malignant (primary) neoplasm, unspecified: Secondary | ICD-10-CM

## 2012-05-09 DIAGNOSIS — F419 Anxiety disorder, unspecified: Secondary | ICD-10-CM | POA: Insufficient documentation

## 2012-05-09 DIAGNOSIS — R630 Anorexia: Secondary | ICD-10-CM

## 2012-05-09 DIAGNOSIS — C50919 Malignant neoplasm of unspecified site of unspecified female breast: Secondary | ICD-10-CM

## 2012-05-09 DIAGNOSIS — F329 Major depressive disorder, single episode, unspecified: Secondary | ICD-10-CM

## 2012-05-09 DIAGNOSIS — R110 Nausea: Secondary | ICD-10-CM

## 2012-05-09 LAB — CBC WITH DIFFERENTIAL/PLATELET
Basophils Absolute: 0 10*3/uL (ref 0.0–0.1)
EOS%: 5 % (ref 0.0–7.0)
Eosinophils Absolute: 0.3 10*3/uL (ref 0.0–0.5)
HCT: 27.3 % — ABNORMAL LOW (ref 34.8–46.6)
HGB: 8.9 g/dL — ABNORMAL LOW (ref 11.6–15.9)
LYMPH%: 11.3 % — ABNORMAL LOW (ref 14.0–49.7)
MCH: 24.9 pg — ABNORMAL LOW (ref 25.1–34.0)
MCV: 76.3 fL — ABNORMAL LOW (ref 79.5–101.0)
MONO%: 9.8 % (ref 0.0–14.0)
NEUT#: 3.8 10*3/uL (ref 1.5–6.5)
NEUT%: 73.5 % (ref 38.4–76.8)
Platelets: 337 10*3/uL (ref 145–400)
RDW: 17.4 % — ABNORMAL HIGH (ref 11.2–14.5)

## 2012-05-09 LAB — COMPREHENSIVE METABOLIC PANEL (CC13)
BUN: 18 mg/dL (ref 7.0–26.0)
CO2: 26 mEq/L (ref 22–29)
Glucose: 89 mg/dl (ref 70–99)
Sodium: 136 mEq/L (ref 136–145)
Total Bilirubin: 0.3 mg/dL (ref 0.20–1.20)
Total Protein: 6.9 g/dL (ref 6.4–8.3)

## 2012-05-09 MED ORDER — OXYCODONE-ACETAMINOPHEN 5-325 MG PO TABS: ORAL_TABLET | ORAL | Status: DC

## 2012-05-09 MED ORDER — LORAZEPAM 2 MG/ML IJ SOLN
0.5000 mg | Freq: Once | INTRAMUSCULAR | Status: AC
  Administered 2012-05-09: 0.5 mg via INTRAVENOUS

## 2012-05-09 MED ORDER — HYDROMORPHONE HCL 2 MG PO TABS: 2.0000 mg | ORAL_TABLET | ORAL | Status: DC | PRN

## 2012-05-09 MED ORDER — ANASTROZOLE 1 MG PO TABS: 1.0000 mg | ORAL_TABLET | Freq: Every day | ORAL | Status: DC

## 2012-05-09 MED ORDER — LORAZEPAM 1 MG PO TABS: 0.5000 mg | ORAL_TABLET | Freq: Once | ORAL | Status: DC

## 2012-05-09 MED ORDER — SODIUM CHLORIDE 0.9 % IJ SOLN
3.0000 mL | Freq: Once | INTRAMUSCULAR | Status: DC | PRN
  Filled 2012-05-09: qty 10

## 2012-05-09 MED ORDER — ZOLEDRONIC ACID 4 MG/5ML IV CONC
4.0000 mg | Freq: Once | INTRAVENOUS | Status: AC
  Administered 2012-05-09: 4 mg via INTRAVENOUS
  Filled 2012-05-09: qty 5

## 2012-05-09 MED ORDER — MEGESTROL ACETATE 40 MG/ML PO SUSP: 200.0000 mg | Freq: Every day | ORAL | Status: DC

## 2012-05-09 NOTE — Progress Notes (Signed)
ID: Kristin [name]   DOB: July 07, 1966  MR#: [id number]  NGE#:[id number]  HISTORY OF PRESENT ILLNESS: Kristin [name] was originally seen in the hospital for an initial consult on September 17, 2009. At that time, she presented with back pain and was found to have a markedly abnormal left breast and axilla as well as lytic bone lesions. We obtained a biopsy of the left breast on February 8th which showed a high-grade tumor which was ER 100% receptor positive but Her-2 and progesterone receptor negative. The proliferation fraction was elevated at 33%. We then obtained a bone biopsy on February 11th and her T12 vertebral body indeed was found to harbor an estrogen receptor positive, progesterone receptor negative carcinoma consistent with her breast primary.  She had complete staging studies including a brain MRI with and without contrast which showed no evidence of metastatic disease to the brain, and a CT of the abdomen and pelvis which showed no liver lesions and no other lesions other than in the bones. There was a fibroid uterus incidentally noted. She had a CT of the chest previously (February 7th) which showed only some chronic appearing changes in the left upper lobe, consistent with her history of treated tuberculosis. Again, there were multiple lytic bone lesions.  Kristin [name] was treated in the neoadjuvant setting of docetaxel cyclophosphamide and doxorubicin x6, completed June 2011. She also received radiation therapy to T3 through T12.  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 Grade 2, negative sentinel lymph node. Patient was started on tamoxifen in July 2011, in addition to zoledronic acid which is been given monthly. Her subsequent treatment is as detailed below.  INTERVAL HISTORY: Kristin [name] returns today accompanied by her mother and sister for followup of her metastatic breast carcinoma.  Interval history is remarkable for recent hospitalization for pancreatitis. During hospitalization, Kristin [name] was found to have progressive disease affecting the liver, in addition to the bones.   She has since received palliative radiation therapy to the right hip, but continues to have significant pain in that area. In fact she has difficulty walking secondary to the pain which she says is a 9-10 on a scale of 1-10. She did get relief from hydromorphone and Percocet, both of which she was discharged with from the hospital, and both of which she needs a refill. She has a home health aide assisting her at home, and has just started physical therapy as well.   REVIEW OF SYSTEMS: [name] denies any fevers or chills. She does have hot flashes. Her appetite is reduced, and she occasionally has nausea for which she utilizes her nausea medications appropriately and affectively. She is having regular bowel movements. She has no cough or phlegm production but does have shortness of breath and fatigue with exertion. She feels weak and tired. No chest pain or palpitations. No abnormal headaches or dizziness. She continues to have some anxiety and depression, but no suicidal ideations. 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  A detailed review of systems is otherwise stable and noncontributory.   PAST MEDICAL HISTORY: Past Medical History  Diagnosis Date  . Breast lump   . Thyroid disease   . Hypertension   . Hx of radiation therapy 06/2010, 10/2009, 11/2011    L breast, T3-T12 spine, L-S spine  . 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 Breast cancer 09/2009    L breast, ER+,  PR-, Her 2 -    PAST SURGICAL HISTORY: Past Surgical History  Procedure Date  . Knee arthroscopy     left  . Breast surgery     left lumpectomy  . Knee cartilage surgery 2006    FAMILY HISTORY Patient's mother is alive and in her mid 9s. She has a history of treated tuberculosis. The patient's father died from a drug overdose, and the  patient does not know much about him. She has one brother with hypertension and one sister, Dois Davenport. There is no known history of breast or ovarian cancer in the family.   GYNECOLOGIC HISTORY: Gx P1, first pregnancy to term at age 70. Her son was premature.  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 She is currently unemployed. She has been living with her mother, Kristin [name], but now has her own place. The patient's son. Kristin [name], has graduated from high school and has applied for a job core position. He is still at home with the patient at least part-time. The patient is a Control and instrumentation engineer.    ADVANCED DIRECTIVES:  HEALTH MAINTENANCE: History  Substance Use Topics  . Smoking status: Former Smoker    Quit date: 08/25/2007  . Smokeless tobacco: Not on file  . Alcohol Use: No     Colonoscopy:  PAP:  Bone density:  Lipid panel:  No Known Allergies  Current Outpatient Prescriptions  Medication Sig Dispense Refill  . diphenhydrAMINE (BENADRYL) 25 MG tablet Take 25 mg by mouth every 6 (six) hours as needed.      Marland Kitchen HYDROmorphone (DILAUDID) 2 MG tablet Take 1 tablet (2 mg total) by mouth every 4 (four) hours as needed for pain. For pain  84 tablet  0  . levothyroxine (SYNTHROID, LEVOTHROID) 150 MCG tablet Take 150 mcg by mouth daily.      Marland Kitchen LORazepam (ATIVAN) 0.5 MG tablet Take 0.5 mg by mouth daily. For anxiety      . metoCLOPramide (REGLAN) 10 MG tablet       . omeprazole (PRILOSEC) 20 MG capsule       . ondansetron (ZOFRAN) 8 MG tablet Take 8 mg by mouth every 8 (eight) hours as needed. Nausea      . ondansetron (ZOFRAN-ODT) 4 MG disintegrating tablet       . oxyCODONE-acetaminophen (PERCOCET/ROXICET) 5-325 MG per tablet Take 1-2 tabs PO Q 8 Hrs PRN breakthrough pain.  Maximum of 6 tabs daily.  84 tablet  0  . promethazine (PHENERGAN) 25 MG tablet Take 25 mg by mouth every 6 (six) hours as needed. Nausea      . anastrozole (ARIMIDEX) 1  MG tablet Take 1 tablet (1 mg total) by mouth daily.  30 tablet  5  . fluconazole (DIFLUCAN) 100 MG tablet Take 100 mg by mouth daily.      . megestrol (MEGACE ORAL) 40 MG/ML suspension Take 5 mLs (200 mg total) by mouth daily.  240 mL  2  . traMADol (ULTRAM) 50 MG tablet Take 1 tablet (50 mg total) by mouth every 6 (six) hours as needed. For pain  120 tablet  0  . venlafaxine XR (EFFEXOR-XR) 75 MG 24 hr capsule Take 2 capsules (150 mg total) by mouth daily.  30 capsule  12   Current Facility-Administered Medications  Medication Dose Route Frequency Provider Last Rate Last Dose  . LORazepam (ATIVAN) tablet 0.5 mg  0.5 mg Oral Once Catalina Gravel, PA  Facility-Administered Medications Ordered in Other Visits  Medication Dose Route Frequency Provider Last Rate Last Dose  . LORazepam (ATIVAN) injection 0.5 mg  0.5 mg Intravenous Once Catalina Gravel, PA   0.5 mg at 05/09/12 1545  . sodium chloride 0.9 % injection 3 mL  3 mL Intravenous Once PRN [name], MD      . zolendronic acid (ZOMETA) 4 mg in sodium chloride 0.9 % 100 mL IVPB  4 mg Intravenous Once [name], MD        OBJECTIVE: Middle-aged [place] American woman who appears uncomfortable and is examined in a wheelchair Filed Vitals:   05/09/12 1418  BP: 112/74  Pulse: 84  Temp: 97.9 F (36.6 C)  Resp: 20     Body mass index is 22.48 kg/(m^2).    ECOG FS: 2 Filed Weights   05/09/12 1418  Weight: 152 lb 3.2 oz (69.037 kg)   Physical Exam: HEENT:  Sclerae anicteric.  Oropharynx clear.   Breast Exam:  Deferred Lungs:  Clear to auscultation bilaterally.  No crackles, rhonchi, or wheezes.   Heart:  Regular rate and rhythm.   Abdomen:  Soft, nontender.  Positive bowel sounds.   Extremities:  No peripheral edema.  Neuro:  Nonfocal, alert and oriented x3   LAB RESULTS: Lab Results  Component Value Date   WBC 5.2 05/09/2012   NEUTROABS 3.8 05/09/2012   HGB 8.9* 05/09/2012   HCT 27.3* 05/09/2012   MCV 76.3*  05/09/2012   PLT 337 05/09/2012      Chemistry      Component Value Date/Time   NA 135 04/27/2012 0330   NA 140 04/12/2012 1342   K 4.0 04/27/2012 0330   K 4.4 04/12/2012 1342   CL 102 04/27/2012 0330   CL 104 04/12/2012 1342   CO2 26 04/27/2012 0330   CO2 28 04/12/2012 1342   BUN <3* 04/27/2012 0330   BUN 20.0 04/12/2012 1342   CREATININE 0.78 04/27/2012 0330   CREATININE 1.1 04/12/2012 1342      Component Value Date/Time   CALCIUM 9.4 04/27/2012 0330   ALKPHOS 65 04/25/2012 0330   ALKPHOS 79 04/12/2012 1342   AST 15 04/25/2012 0330   AST 15 04/12/2012 1342   ALT 11 04/25/2012 0330   ALT 16 04/12/2012 1342   BILITOT 0.2* 04/25/2012 0330   BILITOT 0.20 04/12/2012 1342       Lab Results  Component Value Date   LABCA2 336* 04/12/2012     STUDIES:  Mr Abdomen W Wo Contrast  04/25/2012  *RADIOLOGY REPORT*  Clinical Data: Pancreatitis, abdominal pain, history of metastatic breast cancer  MRI ABDOMEN WITH AND WITHOUT CONTRAST  Technique:  Multiplanar multisequence MR imaging of the abdomen was performed both before and after administration of intravenous contrast.  Contrast: 14mL MULTIHANCE GADOBENATE DIMEGLUMINE 529 MG/ML IV SOLN  Comparison: CT abdomen pelvis dated 04/23/2012  Findings: Severely motion degraded images.  3.3 x 2.0 cm enhancing metastasis in the right hepatic lobe, just posterior to the IVC (series 3/image 18), unchanged from recent CT. No hepatic steatosis.  Peripancreatic inflammatory changes/edema, most prominently adjacent to the pancreatic body/tail, reflecting known acute pancreatitis.  No associated pancreatic ductal dilatation.  Spleen and adrenal glands are within normal limits.  Gallbladder is unremarkable.  No intrahepatic or extrahepatic ductal dilatation.  No cholelithiasis or choledocholithiasis is seen.  Kidneys are unremarkable.  No hydronephrosis.  Known prominent upper abdominal/retroperitoneal lymph nodes are better demonstrated on recent CT.  Multifocal  osseous metastases.   IMPRESSION: Severely motion degraded images.  Peripancreatic inflammatory changes/edema, reflecting known acute pancreatitis.  No underlying etiology is evident on the current study.  Hepatic and osseous metastases.   Original Report Authenticated By: Charline Bills, M.D.    Ct Abdomen Pelvis W Contrast  04/23/2012  *RADIOLOGY REPORT*  Clinical Data: Abdominal pain with vomiting.  Breast cancer.  CT ABDOMEN AND PELVIS WITH CONTRAST  Technique:  Multidetector CT imaging of the abdomen and pelvis was performed following the standard protocol during bolus administration of intravenous contrast.  Contrast: OMNIPAQUE IOHEXOL 300 MG/ML  SOLN  Comparison: Abdominal pelvic CT 04/07/2012.  Findings: Small bilateral pleural effusions have enlarged.  The lung bases are clear.  A metastasis centrally in the right hepatic lobe adjacent to the IVC is best seen on the delayed images and has slightly enlarged, measuring 3.5 cm in diameter.  The liver otherwise appears normal. The gallbladder and biliary system appear normal.  There is new ill- defined enlargement of the pancreatic tail with mild surrounding soft tissue swelling and ill-defined fluid.  In this patient with a mildly elevated lipase level, these findings are supportive of acute pancreatitis.  The splenic and portal veins are patent.  The spleen and adrenal glands appear normal.  There is no hydronephrosis.  Scattered prominent retroperitoneal and mesenteric lymph nodes are stable.  There is also stable mild peritoneal nodularity.  The patient has developed a small amount of pelvic ascites.  Central low density anteriorly in the uterus likely represents a fibroid. There is no discrete adnexal mass.  Widespread osseous metastatic disease is again demonstrated with pathologic rib fractures and several Schmorl's nodes.  There is a pathologic fracture through the right L4 lamina and the right L5 pars interarticularis.   No significant epidural tumor is  identified. There is a sclerotic metastasis involving the left femoral head and neck.  There are lytic metastases involving the right acetabulum.  IMPRESSION:  1.  New enlargement of the pancreatic body and tail with surrounding edema consistent with acute pancreatitis. 2.  Increased pelvic ascites. 3.  Widespread osseous metastatic disease with scattered pathologic fractures as described.  No significant epidural tumor identified. 4.  Hepatic metastasis appears slightly larger.   Original Report Authenticated By: Gerrianne Scale, M.D.      ASSESSMENT: A 46 year old [place] woman with history of breast cancer stage IV at diagnosis February 2011, with metastases to bone and possibly developing spread to lymph nodes, lungs and liver  (1) status post 30 Gy to T-3 through T-18 October 2009  (2) status post docetaxel, cyclophosphamide and doxorubicin x6, completed in June 2011.  (3) status post Left lumpectomy and sentinel lymph node biopsy August 2011 for a 2-mm focus of residual invasive ductal carcinoma, grade 2, negative sentinel lymph node  (4) tamoxifen started July 2011, with evidence of progression March 2013  (5) status post radiation to Left breast completed November 2011  (6) s/p radiation to the lower spine, completed 11/15/2011  (7) fulvestrant/goserelin started 11/03/2011, with evidence of progression in September 2013  (8)  status post palliative radiation to the right hip in September 2013  (9) Continuing goserelin and adding anastrazole, beginning 05/09/2012  (10)  continuing on zolendronic acid monthly   PLAN:  This case and treatment plan were reviewed with Dr. Darnelle Catalan today. His plan is to discontinue Faslodex, continue goserelin and add anastrozole, beginning today . If and when we see further progression, we would likely switch to exemestane with  everolimus.    Over half of our one-hour appointment today was spent reviewing this treatment plan, including possible  side effects, and counseling the patient regarding her prognosis. Janele asked me today, "so am dying  or what?" We discussed the fact that this cancer will eventually take her life although we do not see that this is eminent, and are very willing to try additional therapy. Shaneika and her family were tearful during this conversation, but did voice understanding.  We are continuing her current pain regimen, and I have given her prescriptions today for hydromorphone (2 mg by mouth every 4 hours as needed for pain) and oxycodone/APAP, 5/325 mg (1-2 tablets Q8 hours as needed for pain, with a maximum of 6 tablets daily).  She was given 84 tablets of each medication with no refills, and understands that we will not be able to refill those prescriptions under any circumstances in less than 2 weeks. We will continue to monitor her pain medications closely, and will continue to prescribe a 14 day supply of each.  She was given a prescription for anastrozole today, in addition to Megace which will hopefully help her appetite and improve her hot flashes. She was informed of the possibility of blood clots associated with the Megace. She has taken tamoxifen in the past with no complications.  Kristin [name] will continue to have home health with physical therapy. We will plan on seeing her here on a monthly basis, and she will continue to receive monthly zoledronic acid, with Zoladex given every 3 months as before.  Alyx and her family voice understanding and agreement with this plan. They were given details of this plan in writing today. They know to call with any changes or problems.  Burnie Hank    05/09/2012

## 2012-05-09 NOTE — Progress Notes (Signed)
This RN was confronted by pt's sister in the hall. She stated she went to pick up prescriptions and was told they would have a $10 copay ( 4 prescriptions ) and they do not have any money at this time.  This RN informed sister social work dept would need to be notified and this RN would Optician, dispensing. Message left on VM for social worker. This RN inquired with Tami above situation who stated at this time pt has exhausted funds per [employer].  Call received from Lauren stating sister is in her office stating above and inquired about need. This RN informed her of situation as well as inquiry with Tami as well.

## 2012-05-09 NOTE — Patient Instructions (Signed)
Instructions were handwritten for patient according to treatment plan as dictated.

## 2012-05-09 NOTE — Patient Instructions (Signed)
[facility] Discharge Instructions for Patients Receiving Chemotherapy  Today you received the following chemotherapy agents Zometa To help prevent nausea and vomiting after your treatment, we encourage you to take your nausea medication as prescribed. If you develop nausea and vomiting that is not controlled by your nausea medication, call the clinic. If it is after clinic hours your family physician or the after hours number for the clinic or go to the Emergency Department.   BELOW ARE SYMPTOMS THAT SHOULD BE REPORTED IMMEDIATELY:  *FEVER GREATER THAN 100.5 F  *CHILLS WITH OR WITHOUT FEVER  NAUSEA AND VOMITING THAT IS NOT CONTROLLED WITH YOUR NAUSEA MEDICATION  *UNUSUAL SHORTNESS OF BREATH  *UNUSUAL BRUISING OR BLEEDING  TENDERNESS IN MOUTH AND THROAT WITH OR WITHOUT PRESENCE OF ULCERS  *URINARY PROBLEMS  *BOWEL PROBLEMS  UNUSUAL RASH Items with * indicate a potential emergency and should be followed up as soon as possible.  One of the nurses will contact you 24 hours after your treatment. Please let the nurse know about any problems that you may have experienced. Feel free to call the clinic you have any questions or concerns. The clinic phone number is [phone].   I have been informed and understand all the instructions given to me. I know to contact the clinic, my physician, or go to the Emergency Department if any problems should occur. I do not have any questions at this time, but understand that I may call the clinic during office hours   should I have any questions or need assistance in obtaining follow up care.    __________________________________________  _____________  __________ Signature of Patient or Authorized Representative            Date                   Time    __________________________________________ Nurse's Signature    

## 2012-05-11 ENCOUNTER — Encounter (HOSPITAL_COMMUNITY): Payer: Self-pay

## 2012-05-11 ENCOUNTER — Emergency Department (HOSPITAL_COMMUNITY)
Admission: EM | Admit: 2012-05-11 | Discharge: 2012-05-12 | Disposition: A | Discharge status: Home / Self Care | Payer: Medicare Other | Attending: Emergency Medicine | Admitting: Emergency Medicine

## 2012-05-11 DIAGNOSIS — G8929 Other chronic pain: Secondary | ICD-10-CM | POA: Insufficient documentation

## 2012-05-11 DIAGNOSIS — C801 Malignant (primary) neoplasm, unspecified: Secondary | ICD-10-CM | POA: Insufficient documentation

## 2012-05-11 DIAGNOSIS — I10 Essential (primary) hypertension: Secondary | ICD-10-CM | POA: Insufficient documentation

## 2012-05-11 DIAGNOSIS — Z923 Personal history of irradiation: Secondary | ICD-10-CM | POA: Insufficient documentation

## 2012-05-11 DIAGNOSIS — R109 Unspecified abdominal pain: Secondary | ICD-10-CM

## 2012-05-11 DIAGNOSIS — K859 Acute pancreatitis without necrosis or infection, unspecified: Secondary | ICD-10-CM

## 2012-05-11 DIAGNOSIS — C119 Malignant neoplasm of nasopharynx, unspecified: Secondary | ICD-10-CM | POA: Insufficient documentation

## 2012-05-11 DIAGNOSIS — R112 Nausea with vomiting, unspecified: Secondary | ICD-10-CM | POA: Insufficient documentation

## 2012-05-11 DIAGNOSIS — Z79899 Other long term (current) drug therapy: Secondary | ICD-10-CM | POA: Insufficient documentation

## 2012-05-11 LAB — URINALYSIS, ROUTINE W REFLEX MICROSCOPIC
Bilirubin Urine: NEGATIVE
Glucose, UA: NEGATIVE mg/dL
Hgb urine dipstick: NEGATIVE
Leukocytes, UA: NEGATIVE
pH: 8 (ref 5.0–8.0)

## 2012-05-11 LAB — CBC WITH DIFFERENTIAL/PLATELET
Basophils Absolute: 0 10*3/uL (ref 0.0–0.1)
Basophils Relative: 0 % (ref 0–1)
Lymphocytes Relative: 9 % — ABNORMAL LOW (ref 12–46)
MCHC: 32.7 g/dL (ref 30.0–36.0)
Neutro Abs: 5.2 10*3/uL (ref 1.7–7.7)
Neutrophils Relative %: 82 % — ABNORMAL HIGH (ref 43–77)
RDW: 17.6 % — ABNORMAL HIGH (ref 11.5–15.5)
WBC: 6.3 10*3/uL (ref 4.0–10.5)

## 2012-05-11 LAB — COMPREHENSIVE METABOLIC PANEL
ALT: 9 U/L (ref 0–35)
AST: 17 U/L (ref 0–37)
Albumin: 3.3 g/dL — ABNORMAL LOW (ref 3.5–5.2)
Alkaline Phosphatase: 75 U/L (ref 39–117)
CO2: 26 mEq/L (ref 19–32)
Chloride: 100 mEq/L (ref 96–112)
Potassium: 3.8 mEq/L (ref 3.5–5.1)
Sodium: 135 mEq/L (ref 135–145)
Total Bilirubin: 0.2 mg/dL — ABNORMAL LOW (ref 0.3–1.2)

## 2012-05-11 LAB — POCT I-STAT, CHEM 8
BUN: 13 mg/dL (ref 6–23)
Calcium, Ion: 1.28 mmol/L — ABNORMAL HIGH (ref 1.12–1.23)
Chloride: 105 mEq/L (ref 96–112)
Creatinine, Ser: 1 mg/dL (ref 0.50–1.10)

## 2012-05-11 MED ORDER — SODIUM CHLORIDE 0.9 % IV SOLN
INTRAVENOUS | Status: DC
  Administered 2012-05-11: 125 mL/h via INTRAVENOUS

## 2012-05-11 MED ORDER — ONDANSETRON HCL 4 MG/2ML IJ SOLN
4.0000 mg | Freq: Once | INTRAMUSCULAR | Status: AC
  Administered 2012-05-11: 4 mg via INTRAVENOUS
  Filled 2012-05-11: qty 2

## 2012-05-11 MED ORDER — HYDROMORPHONE HCL PF 1 MG/ML IJ SOLN
1.0000 mg | Freq: Once | INTRAMUSCULAR | Status: AC
  Administered 2012-05-11: 1 mg via INTRAVENOUS
  Filled 2012-05-11: qty 1

## 2012-05-11 MED ORDER — MORPHINE SULFATE 4 MG/ML IJ SOLN
8.0000 mg | Freq: Once | INTRAMUSCULAR | Status: AC
  Administered 2012-05-11: 8 mg via INTRAVENOUS
  Filled 2012-05-11: qty 2

## 2012-05-11 NOTE — ED Provider Notes (Signed)
History     CSN: [id number]  Arrival date & time 05/11/12  4098   First MD Initiated Contact with Patient 05/11/12 2000      Chief Complaint  Patient presents with  . Abdominal Pain  . Nausea  . Emesis    (Consider location/radiation/quality/duration/timing/severity/associated sxs/prior treatment) HPI  46 year old female with advance breast cancer and mult metastasis presents c/o abd pain.  Patient reports she is having abdominal pain felt similar to the pain she had when she was last admitted earlier this month. Pain is gradual onset, sharp, persistent, radiating throughout abdomen.  After eating. She has had more than 5 bouts of nonbloody nonbilious vomit fever, chills, chest pain, shortness of breath, diarrhea, urinary symptoms.  She reports she is unable to take her home medication due to nausea and abdominal pain.   patient requests for admission for pain control.   Last abdominal and pelvis CT scan on 9/16 and abd MRI which shows evidence of multiple metastatic disease, and acute pancreatitis.  Pt currently being treated with radiation per her oncologist, Dr. Dayton Scrape.    Past Medical History  Diagnosis Date  . Breast lump   . Thyroid disease   . Hypertension   . Hx of radiation therapy 06/2010, 10/2009, 11/2011    L breast, T3-T12 spine, L-S spine  . 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 Breast cancer 09/2009    L breast, ER+, PR-, Her 2 -    Past Surgical History  Procedure Date  . Knee arthroscopy     left  . Breast surgery     left lumpectomy  . Knee cartilage surgery 2006    History reviewed. No pertinent family history.  History  Substance Use Topics  . Smoking status: Former Smoker    Quit date: 08/25/2007  . Smokeless tobacco: Not on file  . Alcohol Use: No    OB History    Grav Para Term Preterm Abortions TAB SAB Ect Mult Living                  Review of Systems  Constitutional: Negative for fever.  Respiratory: Negative for shortness of breath.    Cardiovascular: Negative for chest pain.  Gastrointestinal: Positive for abdominal pain.  Skin: Negative for rash.  Neurological: Negative for numbness.  All other systems reviewed and are negative.    Allergies  Review of patient's allergies indicates no known allergies.  Home Medications   Current Outpatient Rx  Name Route Sig Dispense Refill  . ANASTROZOLE 1 MG PO TABS Oral Take 1 tablet (1 mg total) by mouth daily. 30 tablet 5  . DIPHENHYDRAMINE HCL 25 MG PO TABS Oral Take 25 mg by mouth every 6 (six) hours as needed. For anxiety/sleep.    Marland Kitchen FLUCONAZOLE 100 MG PO TABS Oral Take 100 mg by mouth daily.    Marland Kitchen HYDROMORPHONE HCL 2 MG PO TABS Oral Take 1 tablet (2 mg total) by mouth every 4 (four) hours as needed for pain. For pain 84 tablet 0  . LEVOTHYROXINE SODIUM 150 MCG PO TABS Oral Take 150 mcg by mouth daily.    Marland Kitchen LORAZEPAM 0.5 MG PO TABS Oral Take 0.5 mg by mouth daily. For anxiety    . MEGESTROL ACETATE 40 MG/ML PO SUSP Oral Take 5 mLs (200 mg total) by mouth daily. 240 mL 2  . METOCLOPRAMIDE HCL 10 MG PO TABS Oral Take 10 mg by mouth 4 (four) times daily.     Marland Kitchen  OMEPRAZOLE 20 MG PO CPDR Oral Take 20 mg by mouth daily.     Marland Kitchen ONDANSETRON 4 MG PO TBDP Oral Take 4 mg by mouth every 8 (eight) hours as needed. For nausea.    . OXYCODONE-ACETAMINOPHEN 5-325 MG PO TABS Oral Take 1-2 tablets by mouth every 8 (eight) hours as needed. For pain.    Marland Kitchen PRESCRIPTION MEDICATION Intravenous Inject into the vein every 28 (twenty-eight) days. Zometa once monthly.    Marland Kitchen PROMETHAZINE HCL 25 MG PO TABS Oral Take 25 mg by mouth every 6 (six) hours as needed. Nausea    . TRAMADOL HCL 50 MG PO TABS Oral Take 1 tablet (50 mg total) by mouth every 6 (six) hours as needed. For pain 120 tablet 0  . VENLAFAXINE HCL ER 75 MG PO CP24 Oral Take 2 capsules (150 mg total) by mouth daily. 30 capsule 12    BP 184/93  Pulse 93  Temp 98.6 F (37 C) (Oral)  Resp 20  SpO2 96%  LMP 08/25/2007  Physical Exam    Nursing note and vitals reviewed. Constitutional: She appears well-developed and well-nourished.       Uncomfortable appearing  HENT:  Head: Normocephalic and atraumatic.  Eyes: Conjunctivae normal are normal.  Neck: Neck supple.  Cardiovascular: Normal rate and regular rhythm.   Pulmonary/Chest: Effort normal and breath sounds normal. No respiratory distress.  Abdominal: Soft. There is tenderness in the periumbilical area and left upper quadrant. There is guarding. There is no rigidity and no rebound.  Musculoskeletal: Normal range of motion.  Neurological: She is alert.  Skin: Skin is warm.  Psychiatric: She has a normal mood and affect.    ED Course  Procedures (including critical care time)   Labs Reviewed  CBC WITH DIFFERENTIAL  COMPREHENSIVE METABOLIC PANEL  LIPASE, BLOOD  URINALYSIS, ROUTINE W REFLEX MICROSCOPIC   Results for orders placed during the hospital encounter of 05/11/12  CBC WITH DIFFERENTIAL      Component Value Range   WBC 6.3  4.0 - 10.5 K/uL   RBC 4.03  3.87 - 5.11 MIL/uL   Hemoglobin 9.9 (*) 12.0 - 15.0 g/dL   HCT 40.9 (*) 81.1 - 91.4 %   MCV 75.2 (*) 78.0 - 100.0 fL   MCH 24.6 (*) 26.0 - 34.0 pg   MCHC 32.7  30.0 - 36.0 g/dL   RDW 78.2 (*) 95.6 - 21.3 %   Platelets 415 (*) 150 - 400 K/uL   Neutrophils Relative 82 (*) 43 - 77 %   Neutro Abs 5.2  1.7 - 7.7 K/uL   Lymphocytes Relative 9 (*) 12 - 46 %   Lymphs Abs 0.6 (*) 0.7 - 4.0 K/uL   Monocytes Relative 7  3 - 12 %   Monocytes Absolute 0.4  0.1 - 1.0 K/uL   Eosinophils Relative 1  0 - 5 %   Eosinophils Absolute 0.1  0.0 - 0.7 K/uL   Basophils Relative 0  0 - 1 %   Basophils Absolute 0.0  0.0 - 0.1 K/uL  COMPREHENSIVE METABOLIC PANEL      Component Value Range   Sodium 135  135 - 145 mEq/L   Potassium 3.8  3.5 - 5.1 mEq/L   Chloride 100  96 - 112 mEq/L   CO2 26  19 - 32 mEq/L   Glucose, Bld 120 (*) 70 - 99 mg/dL   BUN 12  6 - 23 mg/dL   Creatinine, Ser 0.86  0.50 - 1.10 mg/dL  Calcium  10.3  8.4 - 10.5 mg/dL   Total Protein 7.8  6.0 - 8.3 g/dL   Albumin 3.3 (*) 3.5 - 5.2 g/dL   AST 17  0 - 37 U/L   ALT 9  0 - 35 U/L   Alkaline Phosphatase 75  39 - 117 U/L   Total Bilirubin 0.2 (*) 0.3 - 1.2 mg/dL   GFR calc non Af Amer >90  >90 mL/min   GFR calc Af Amer >90  >90 mL/min  LIPASE, BLOOD      Component Value Range   Lipase 94 (*) 11 - 59 U/L  URINALYSIS, ROUTINE W REFLEX MICROSCOPIC      Component Value Range   Color, Urine YELLOW  YELLOW   APPearance CLEAR  CLEAR   Specific Gravity, Urine 1.019  1.005 - 1.030   pH 8.0  5.0 - 8.0   Glucose, UA NEGATIVE  NEGATIVE mg/dL   Hgb urine dipstick NEGATIVE  NEGATIVE   Bilirubin Urine NEGATIVE  NEGATIVE   Ketones, ur TRACE (*) NEGATIVE mg/dL   Protein, ur NEGATIVE  NEGATIVE mg/dL   Urobilinogen, UA 1.0  0.0 - 1.0 mg/dL   Nitrite NEGATIVE  NEGATIVE   Leukocytes, UA NEGATIVE  NEGATIVE  POCT I-STAT, CHEM 8      Component Value Range   Sodium 139  135 - 145 mEq/L   Potassium 3.9  3.5 - 5.1 mEq/L   Chloride 105  96 - 112 mEq/L   BUN 13  6 - 23 mg/dL   Creatinine, Ser 1.61  0.50 - 1.10 mg/dL   Glucose, Bld 096 (*) 70 - 99 mg/dL   Calcium, Ion 0.45 (*) 1.12 - 1.23 mmol/L   TCO2 24  0 - 100 mmol/L   Hemoglobin 11.9 (*) 12.0 - 15.0 g/dL   HCT 40.9 (*) 81.1 - 91.4 %   Mr Abdomen W Wo Contrast  04/25/2012  *RADIOLOGY REPORT*  Clinical Data: Pancreatitis, abdominal pain, history of metastatic breast cancer  MRI ABDOMEN WITH AND WITHOUT CONTRAST  Technique:  Multiplanar multisequence MR imaging of the abdomen was performed both before and after administration of intravenous contrast.  Contrast: 14mL MULTIHANCE GADOBENATE DIMEGLUMINE 529 MG/ML IV SOLN  Comparison: CT abdomen pelvis dated 04/23/2012  Findings: Severely motion degraded images.  3.3 x 2.0 cm enhancing metastasis in the right hepatic lobe, just posterior to the IVC (series 3/image 18), unchanged from recent CT. No hepatic steatosis.  Peripancreatic inflammatory  changes/edema, most prominently adjacent to the pancreatic body/tail, reflecting known acute pancreatitis.  No associated pancreatic ductal dilatation.  Spleen and adrenal glands are within normal limits.  Gallbladder is unremarkable.  No intrahepatic or extrahepatic ductal dilatation.  No cholelithiasis or choledocholithiasis is seen.  Kidneys are unremarkable.  No hydronephrosis.  Known prominent upper abdominal/retroperitoneal lymph nodes are better demonstrated on recent CT.  Multifocal osseous metastases.  IMPRESSION: Severely motion degraded images.  Peripancreatic inflammatory changes/edema, reflecting known acute pancreatitis.  No underlying etiology is evident on the current study.  Hepatic and osseous metastases.   Original Report Authenticated By: Charline Bills, M.D.    Ct Abdomen Pelvis W Contrast  04/23/2012  *RADIOLOGY REPORT*  Clinical Data: Abdominal pain with vomiting.  Breast cancer.  CT ABDOMEN AND PELVIS WITH CONTRAST  Technique:  Multidetector CT imaging of the abdomen and pelvis was performed following the standard protocol during bolus administration of intravenous contrast.  Contrast: OMNIPAQUE IOHEXOL 300 MG/ML  SOLN  Comparison: Abdominal pelvic CT 04/07/2012.  Findings:  Small bilateral pleural effusions have enlarged.  The lung bases are clear.  A metastasis centrally in the right hepatic lobe adjacent to the IVC is best seen on the delayed images and has slightly enlarged, measuring 3.5 cm in diameter.  The liver otherwise appears normal. The gallbladder and biliary system appear normal.  There is new ill- defined enlargement of the pancreatic tail with mild surrounding soft tissue swelling and ill-defined fluid.  In this patient with a mildly elevated lipase level, these findings are supportive of acute pancreatitis.  The splenic and portal veins are patent.  The spleen and adrenal glands appear normal.  There is no hydronephrosis.  Scattered prominent retroperitoneal and  mesenteric lymph nodes are stable.  There is also stable mild peritoneal nodularity.  The patient has developed a small amount of pelvic ascites.  Central low density anteriorly in the uterus likely represents a fibroid. There is no discrete adnexal mass.  Widespread osseous metastatic disease is again demonstrated with pathologic rib fractures and several Schmorl's nodes.  There is a pathologic fracture through the right L4 lamina and the right L5 pars interarticularis.   No significant epidural tumor is identified. There is a sclerotic metastasis involving the left femoral head and neck.  There are lytic metastases involving the right acetabulum.  IMPRESSION:  1.  New enlargement of the pancreatic body and tail with surrounding edema consistent with acute pancreatitis. 2.  Increased pelvic ascites. 3.  Widespread osseous metastatic disease with scattered pathologic fractures as described.  No significant epidural tumor identified. 4.  Hepatic metastasis appears slightly larger.   Original Report Authenticated By: Gerrianne Scale, M.D.     1. Abdominal pain 2. Hx of metastatic breast cancer  MDM  Acute on chronic abd pain with nausea and vomiting.  Was hospitalized for pancreatitis earlier this month.    10:05 PM Pt's pain minimally relieved with pain meds, will continue pain management.  Discussed care with my attending.   10:15 PM i have consulted with oncology radiologist Dr. Mitzi Hansen, who covers for Dr. Dayton Scrape.  He recommend medical management for pain control for pt at this time.      11:26 PM I have consulted with [facility] hospitalist, who agrees to admit pt for pain control.  However, after hospitalist evaluate pt, pt sts her pain is better and she is willing to go home.  She has pain medications at home.  Recommend clear liquid diet.  Pt to f/u with her oncologist and also with her PCP.  Dr. Conley Rolls, who evaluate pt felt she is stable to be discharge.  Pt voice understanding and agrees with plan.   Pt will be d/c.  Strict return precaution discussed.  Pt able to tolerates PO at this time.    BP 168/91  Pulse 94  Temp 98.6 F (37 C) (Oral)  Resp 22  SpO2 96%  LMP 08/25/2007  Nursing notes reviewed and considered in documentation  Previous records reviewed and considered  All labs/vitals reviewed and considered  xrays reviewed and considered   Fayrene Helper, PA-C 05/11/12 2346

## 2012-05-11 NOTE — ED Notes (Signed)
Per EMS, pt from home.  Pt has breast/liver cancer.  C/O abdominal pain starting today.  Started new medications recently.  Meds make her sensitive to sunlight.  She was outside today and she feels it is making her more sick.  Vitals  160/104, hr 90, 18.  Hx Thyroid, Cancer

## 2012-05-11 NOTE — Consult Note (Signed)
[facility] History and Physical  Kristin [name] YNW:[id number] DOB: 1966-02-19    PCP:   [name], MD   Chief Complaint: abdominal pain.  Requesting MD:  Dr Ranae Palms.  Reason:   Asked to evaluate for admission to control pain.  HPI: Kristin [name] is an 46 y.o. female with hx of metastatic breast cancer to her liver, recently admitted for pancreatitis by CT, Hx of HTN, thyroid surgery, discharged on 04/27/12, returned to ER complaining of abdominal pain.  Her pain has been inconsistent.  She also has nausea and vomiting.  She was given dilaudid and that made her felt better.  She has several bottles of narcotics including Hydromorphine and Hydrocodone.  Evaluation in the ER showed Lipase of 94, with normal WBC, and normal renal fx tests. Hospitalist was asked to admit her for pain control.  Rewiew of Systems:  Constitutional: Negative for malaise, fever and chills. No significant weight loss or weight gain Eyes: Negative for eye pain, redness and discharge, diplopia, visual changes, or flashes of light. ENMT: Negative for ear pain, hoarseness, nasal congestion, sinus pressure and sore throat. No headaches; tinnitus, drooling, or problem swallowing. Cardiovascular: Negative for chest pain, palpitations, diaphoresis, dyspnea and peripheral edema. ; No orthopnea, PND Respiratory: Negative for cough, hemoptysis, wheezing and stridor. No pleuritic chestpain. Gastrointestinal: Negative, diarrhea, constipation, melena, blood in stool, hematemesis, jaundice and rectal bleeding.    Genitourinary: Negative for frequency, dysuria, incontinence,flank pain and hematuria; Musculoskeletal: Negative for back pain and neck pain. Negative for swelling and trauma.;  Skin: . Negative for pruritus, rash, abrasions, bruising and skin lesion.; ulcerations Neuro: Negative for headache, lightheadedness and neck stiffness. Negative for weakness, altered level of consciousness , altered mental  status, extremity weakness, burning feet, involuntary movement, seizure and syncope.  Psych: negative for anxiety, depression, insomnia, tearfulness, panic attacks, hallucinations, paranoia, suicidal or homicidal ideation    Past Medical History  Diagnosis Date  . Breast lump   . Thyroid disease   . Hypertension   . Hx of radiation therapy 06/2010, 10/2009, 11/2011    L breast, T3-T12 spine, L-S spine  . 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 Breast cancer 09/2009    L breast, ER+, PR-, Her 2 -    Past Surgical History  Procedure Date  . Knee arthroscopy     left  . Breast surgery     left lumpectomy  . Knee cartilage surgery 2006    Medications:  HOME MEDS: Prior to Admission medications   Medication Sig Start Date End Date Taking? Authorizing Provider  anastrozole (ARIMIDEX) 1 MG tablet Take 1 tablet (1 mg total) by mouth daily. 05/09/12  Yes Amy Allegra Grana, PA  diphenhydrAMINE (BENADRYL) 25 MG tablet Take 25 mg by mouth every 6 (six) hours as needed. For anxiety/sleep. 04/22/12 05/22/12 Yes Tia Oliveri, PA-C  fluconazole (DIFLUCAN) 100 MG tablet Take 100 mg by mouth daily.   Yes Historical Provider, MD  HYDROmorphone (DILAUDID) 2 MG tablet Take 1 tablet (2 mg total) by mouth every 4 (four) hours as needed for pain. For pain 05/09/12  Yes Amy Allegra Grana, PA  levothyroxine (SYNTHROID, LEVOTHROID) 150 MCG tablet Take 150 mcg by mouth daily.   Yes Historical Provider, MD  LORazepam (ATIVAN) 0.5 MG tablet Take 0.5 mg by mouth daily. For anxiety 03/16/12  Yes [name], MD  megestrol (MEGACE ORAL) 40 MG/ML suspension Take 5 mLs (200 mg total) by mouth daily. 05/09/12  Yes Amy Allegra Grana, PA  metoCLOPramide (REGLAN) 10 MG tablet Take 10 mg by mouth 4 (four) times daily.  04/27/12  Yes Historical Provider, MD  omeprazole (PRILOSEC) 20 MG capsule Take 20 mg by mouth daily.  04/19/12  Yes Historical Provider, MD  ondansetron (ZOFRAN-ODT) 4 MG disintegrating tablet Take 4 mg by mouth every 8 (eight)  hours as needed. For nausea. 04/19/12  Yes Historical Provider, MD  oxyCODONE-acetaminophen (PERCOCET/ROXICET) 5-325 MG per tablet Take 1-2 tablets by mouth every 8 (eight) hours as needed. For pain.   Yes Historical Provider, MD  PRESCRIPTION MEDICATION Inject into the vein every 28 (twenty-eight) days. Zometa once monthly.   Yes Historical Provider, MD  promethazine (PHENERGAN) 25 MG tablet Take 25 mg by mouth every 6 (six) hours as needed. Nausea   Yes Historical Provider, MD  traMADol (ULTRAM) 50 MG tablet Take 1 tablet (50 mg total) by mouth every 6 (six) hours as needed. For pain 03/31/12  Yes [name], MD  venlafaxine XR (EFFEXOR-XR) 75 MG 24 hr capsule Take 2 capsules (150 mg total) by mouth daily. 03/31/12  Yes [name], MD     Allergies:  No Known Allergies  Social History:   reports that she quit smoking about 4 years ago. She does not have any smokeless tobacco history on file. She reports that she does not drink alcohol or use illicit drugs.  Family History: History reviewed. No pertinent family history.   Physical Exam: Filed Vitals:   05/11/12 1938 05/11/12 2023  BP: 184/93 168/91  Pulse: 93 94  Temp: 98.6 F (37 C)   TempSrc: Oral   Resp: 20 22  SpO2: 96% 96%   Blood pressure 168/91, pulse 94, temperature 98.6 F (37 C), temperature source Oral, resp. rate 22, last menstrual period 08/25/2007, SpO2 96.00%.  GEN:  Pleasant  patient lying in the stretcher in no acute distress; cooperative with exam. PSYCH:  alert and oriented x4; does not appear anxious or depressed; affect is appropriate. HEENT: Mucous membranes pink and anicteric; PERRLA; EOM intact; no cervical lymphadenopathy nor thyromegaly or carotid bruit; no JVD; There were no stridor. Neck is very supple. Breasts:: Not examined CHEST WALL: No tenderness CHEST: Normal respiration, clear to auscultation bilaterally.  HEART: Regular rate and rhythm.  There are no murmur, rub, or gallops.     BACK: No kyphosis or scoliosis; no CVA tenderness ABDOMEN: soft slightly tender over the epigastrium; no masses, no organomegaly, normal abdominal bowel sounds; no pannus; no intertriginous candida. There is no rebound and no distention. Rectal Exam: Not done EXTREMITIES: No bone or joint deformity; age-appropriate arthropathy of the hands and knees; no edema; no ulcerations.  There is no calf tenderness. Genitalia: not examined PULSES: 2+ and symmetric SKIN: Normal hydration no rash or ulceration CNS: Cranial nerves 2-12 grossly intact no focal lateralizing neurologic deficit.  Speech is fluent; uvula elevated with phonation, facial symmetry and tongue midline. DTR are normal bilaterally, cerebella exam is intact, barbinski is negative and strengths are equaled bilaterally.  No sensory loss.   Labs on Admission:  Basic Metabolic Panel:  Lab 05/11/12 7829 05/11/12 2015 05/09/12 1402  NA 139 135 136  K 3.9 3.8 4.5  CL 105 100 102  CO2 -- 26 26  GLUCOSE 118* 120* 89  BUN 13 12 18.0  CREATININE 1.00 0.77 0.9  CALCIUM -- 10.3 11.1*  MG -- -- --  PHOS -- -- --   Liver Function Tests:  Lab 05/11/12 2015 05/09/12 1402  AST 17  15  ALT 9 9  ALKPHOS 75 73  BILITOT 0.2* 0.30  PROT 7.8 6.9  ALBUMIN 3.3* 2.9*    Lab 05/11/12 2015  LIPASE 94*  AMYLASE --   No results found for this basename: AMMONIA:5 in the last 168 hours CBC:  Lab 05/11/12 2024 05/11/12 2015 05/09/12 1402  WBC -- 6.3 5.2  NEUTROABS -- 5.2 3.8  HGB 11.9* 9.9* 8.9*  HCT 35.0* 30.3* 27.3*  MCV -- 75.2* 76.3*  PLT -- 415* 337   Cardiac Enzymes: No results found for this basename: CKTOTAL:5,CKMB:5,CKMBINDEX:5,TROPONINI:5 in the last 168 hours  CBG: No results found for this basename: GLUCAP:5 in the last 168 hours   Radiological Exams on Admission: No results found.    Assessment/Plan Pancreatitis Metastatic breast cancer. Nausea and vomiting.  PLAN:  I discussed that I would be glad to admit her  for pain medicine and antiemetics, but she ended up wanting to go home and try PO meds.  Since she has steadily improved, I think it would be reasonable to be discharged home.  I recommended to her to take the least amount of narcotics, just enough to alleviate her pain, as they can cause nausea.  She is to stay on clear liquid and if she gets worse, to return to the ER or follow up with her oncologist.  I recommended that she hold off on the Megace in fear that it may be thrombogenic.  Other plans as per orders.  Code Status: FULL Unk Lightning, MD. [facility] Pager [phone] 7pm to 7am.  05/11/2012, 10:59 PM

## 2012-05-12 ENCOUNTER — Inpatient Hospital Stay (HOSPITAL_COMMUNITY)
Admission: EM | Admit: 2012-05-12 | Discharge: 2012-05-15 | DRG: 391 | Disposition: A | Discharge status: Home / Self Care | Payer: Medicare Other | Attending: Internal Medicine | Admitting: Internal Medicine

## 2012-05-12 ENCOUNTER — Emergency Department (HOSPITAL_COMMUNITY): Payer: Medicare Other

## 2012-05-12 ENCOUNTER — Encounter (HOSPITAL_COMMUNITY): Payer: Self-pay | Admitting: Emergency Medicine

## 2012-05-12 DIAGNOSIS — C7800 Secondary malignant neoplasm of unspecified lung: Secondary | ICD-10-CM | POA: Diagnosis present

## 2012-05-12 DIAGNOSIS — M8448XA Pathological fracture, other site, initial encounter for fracture: Secondary | ICD-10-CM | POA: Diagnosis present

## 2012-05-12 DIAGNOSIS — F419 Anxiety disorder, unspecified: Secondary | ICD-10-CM | POA: Diagnosis present

## 2012-05-12 DIAGNOSIS — C7952 Secondary malignant neoplasm of bone marrow: Secondary | ICD-10-CM | POA: Diagnosis present

## 2012-05-12 DIAGNOSIS — Z923 Personal history of irradiation: Secondary | ICD-10-CM

## 2012-05-12 DIAGNOSIS — C787 Secondary malignant neoplasm of liver and intrahepatic bile duct: Secondary | ICD-10-CM | POA: Diagnosis present

## 2012-05-12 DIAGNOSIS — C50919 Malignant neoplasm of unspecified site of unspecified female breast: Secondary | ICD-10-CM | POA: Diagnosis present

## 2012-05-12 DIAGNOSIS — R52 Pain, unspecified: Secondary | ICD-10-CM

## 2012-05-12 DIAGNOSIS — I10 Essential (primary) hypertension: Secondary | ICD-10-CM | POA: Diagnosis present

## 2012-05-12 DIAGNOSIS — R109 Unspecified abdominal pain: Secondary | ICD-10-CM

## 2012-05-12 DIAGNOSIS — E236 Other disorders of pituitary gland: Secondary | ICD-10-CM | POA: Diagnosis present

## 2012-05-12 DIAGNOSIS — D630 Anemia in neoplastic disease: Secondary | ICD-10-CM | POA: Diagnosis present

## 2012-05-12 DIAGNOSIS — C799 Secondary malignant neoplasm of unspecified site: Secondary | ICD-10-CM

## 2012-05-12 DIAGNOSIS — F411 Generalized anxiety disorder: Secondary | ICD-10-CM | POA: Diagnosis present

## 2012-05-12 DIAGNOSIS — K920 Hematemesis: Secondary | ICD-10-CM | POA: Diagnosis present

## 2012-05-12 DIAGNOSIS — E871 Hypo-osmolality and hyponatremia: Secondary | ICD-10-CM | POA: Diagnosis present

## 2012-05-12 DIAGNOSIS — R110 Nausea: Secondary | ICD-10-CM

## 2012-05-12 DIAGNOSIS — Z87891 Personal history of nicotine dependence: Secondary | ICD-10-CM

## 2012-05-12 DIAGNOSIS — Z79899 Other long term (current) drug therapy: Secondary | ICD-10-CM

## 2012-05-12 DIAGNOSIS — C7951 Secondary malignant neoplasm of bone: Secondary | ICD-10-CM | POA: Diagnosis present

## 2012-05-12 DIAGNOSIS — K859 Acute pancreatitis without necrosis or infection, unspecified: Secondary | ICD-10-CM | POA: Diagnosis present

## 2012-05-12 DIAGNOSIS — D649 Anemia, unspecified: Secondary | ICD-10-CM | POA: Diagnosis present

## 2012-05-12 DIAGNOSIS — C779 Secondary and unspecified malignant neoplasm of lymph node, unspecified: Secondary | ICD-10-CM | POA: Diagnosis present

## 2012-05-12 LAB — CBC WITH DIFFERENTIAL/PLATELET
Eosinophils Relative: 0 % (ref 0–5)
HCT: 30.3 % — ABNORMAL LOW (ref 36.0–46.0)
Lymphocytes Relative: 11 % — ABNORMAL LOW (ref 12–46)
Lymphs Abs: 0.5 10*3/uL — ABNORMAL LOW (ref 0.7–4.0)
MCV: 75.4 fL — ABNORMAL LOW (ref 78.0–100.0)
Monocytes Absolute: 0.3 10*3/uL (ref 0.1–1.0)
RDW: 17.8 % — ABNORMAL HIGH (ref 11.5–15.5)
WBC: 5 10*3/uL (ref 4.0–10.5)

## 2012-05-12 LAB — COMPREHENSIVE METABOLIC PANEL
CO2: 22 mEq/L (ref 19–32)
Calcium: 9.5 mg/dL (ref 8.4–10.5)
Creatinine, Ser: 0.62 mg/dL (ref 0.50–1.10)
GFR calc Af Amer: >90 mL/min (ref >90)
GFR calc non Af Amer: >90 mL/min (ref >90)
Glucose, Bld: 92 mg/dL (ref 70–99)

## 2012-05-12 LAB — URINALYSIS, ROUTINE W REFLEX MICROSCOPIC
Leukocytes, UA: NEGATIVE
Protein, ur: NEGATIVE mg/dL
Urobilinogen, UA: 1 mg/dL (ref 0.0–1.0)

## 2012-05-12 LAB — LIPASE, BLOOD: Lipase: 38 U/L (ref 11–59)

## 2012-05-12 MED ORDER — LORAZEPAM 0.5 MG PO TABS
0.5000 mg | ORAL_TABLET | Freq: Every day | ORAL | Status: DC
  Administered 2012-05-13 – 2012-05-15 (×3): 0.5 mg via ORAL
  Filled 2012-05-12 (×3): qty 1

## 2012-05-12 MED ORDER — SENNA 8.6 MG PO TABS
2.0000 | ORAL_TABLET | Freq: Every day | ORAL | Status: DC
  Administered 2012-05-13 – 2012-05-14 (×3): 17.2 mg via ORAL
  Filled 2012-05-12 (×3): qty 2

## 2012-05-12 MED ORDER — ACETAMINOPHEN 650 MG RE SUPP: 650.0000 mg | Freq: Four times a day (QID) | RECTAL | Status: DC | PRN

## 2012-05-12 MED ORDER — SODIUM CHLORIDE 0.9 % IV BOLUS (SEPSIS)
1000.0000 mL | Freq: Once | INTRAVENOUS | Status: AC
  Administered 2012-05-12: 1000 mL via INTRAVENOUS

## 2012-05-12 MED ORDER — ACETAMINOPHEN 325 MG PO TABS: 650.0000 mg | ORAL_TABLET | Freq: Four times a day (QID) | ORAL | Status: DC | PRN

## 2012-05-12 MED ORDER — MORPHINE SULFATE ER 15 MG PO TBCR
15.0000 mg | EXTENDED_RELEASE_TABLET | Freq: Two times a day (BID) | ORAL | Status: DC
  Administered 2012-05-13 – 2012-05-15 (×6): 15 mg via ORAL
  Filled 2012-05-12 (×6): qty 1

## 2012-05-12 MED ORDER — OXYCODONE-ACETAMINOPHEN 5-325 MG PO TABS
1.0000 | ORAL_TABLET | Freq: Four times a day (QID) | ORAL | Status: DC | PRN
  Administered 2012-05-13 – 2012-05-15 (×3): 1 via ORAL
  Filled 2012-05-12 (×4): qty 1

## 2012-05-12 MED ORDER — PROMETHAZINE HCL 25 MG PO TABS: 25.0000 mg | ORAL_TABLET | Freq: Four times a day (QID) | ORAL | Status: DC | PRN

## 2012-05-12 MED ORDER — PANTOPRAZOLE SODIUM 40 MG PO TBEC
40.0000 mg | DELAYED_RELEASE_TABLET | Freq: Every day | ORAL | Status: DC
  Administered 2012-05-13 – 2012-05-15 (×3): 40 mg via ORAL
  Filled 2012-05-12 (×3): qty 1

## 2012-05-12 MED ORDER — ANASTROZOLE 1 MG PO TABS
1.0000 mg | ORAL_TABLET | Freq: Every day | ORAL | Status: DC
  Administered 2012-05-13 – 2012-05-15 (×3): 1 mg via ORAL
  Filled 2012-05-12 (×4): qty 1

## 2012-05-12 MED ORDER — HYDROMORPHONE HCL PF 1 MG/ML IJ SOLN
1.0000 mg | Freq: Once | INTRAMUSCULAR | Status: AC
  Administered 2012-05-12: 1 mg via INTRAVENOUS
  Filled 2012-05-12: qty 1

## 2012-05-12 MED ORDER — ONDANSETRON HCL 4 MG PO TABS
4.0000 mg | ORAL_TABLET | Freq: Four times a day (QID) | ORAL | Status: DC | PRN
  Administered 2012-05-13: 4 mg via ORAL
  Filled 2012-05-12: qty 1

## 2012-05-12 MED ORDER — LEVOTHYROXINE SODIUM 150 MCG PO TABS
150.0000 ug | ORAL_TABLET | Freq: Every day | ORAL | Status: DC
  Administered 2012-05-13 – 2012-05-15 (×3): 150 ug via ORAL
  Filled 2012-05-12 (×3): qty 1

## 2012-05-12 MED ORDER — ONDANSETRON HCL 4 MG/2ML IJ SOLN
4.0000 mg | Freq: Once | INTRAMUSCULAR | Status: AC
  Administered 2012-05-12: 4 mg via INTRAVENOUS
  Filled 2012-05-12: qty 2

## 2012-05-12 MED ORDER — ENOXAPARIN SODIUM 40 MG/0.4ML ~~LOC~~ SOLN
40.0000 mg | Freq: Every day | SUBCUTANEOUS | Status: DC
  Administered 2012-05-13 – 2012-05-14 (×2): 40 mg via SUBCUTANEOUS
  Filled 2012-05-12 (×5): qty 0.4

## 2012-05-12 MED ORDER — DOCUSATE SODIUM 100 MG PO CAPS
100.0000 mg | ORAL_CAPSULE | Freq: Two times a day (BID) | ORAL | Status: DC
  Administered 2012-05-13 – 2012-05-15 (×5): 100 mg via ORAL
  Filled 2012-05-12 (×9): qty 1

## 2012-05-12 MED ORDER — SODIUM CHLORIDE 0.9 % IV SOLN
INTRAVENOUS | Status: DC
  Administered 2012-05-13 – 2012-05-14 (×3): via INTRAVENOUS
  Administered 2012-05-15: 1000 mL via INTRAVENOUS

## 2012-05-12 MED ORDER — VENLAFAXINE HCL ER 150 MG PO CP24
150.0000 mg | ORAL_CAPSULE | Freq: Every day | ORAL | Status: DC
  Administered 2012-05-13 – 2012-05-15 (×3): 150 mg via ORAL
  Filled 2012-05-12 (×3): qty 1

## 2012-05-12 MED ORDER — METOCLOPRAMIDE HCL 10 MG PO TABS
10.0000 mg | ORAL_TABLET | Freq: Four times a day (QID) | ORAL | Status: DC
  Administered 2012-05-13 – 2012-05-15 (×7): 10 mg via ORAL
  Filled 2012-05-12 (×13): qty 1

## 2012-05-12 MED ORDER — ONDANSETRON HCL 4 MG/2ML IJ SOLN
INTRAMUSCULAR | Status: AC
  Filled 2012-05-12: qty 2

## 2012-05-12 MED ORDER — FENTANYL CITRATE 0.05 MG/ML IJ SOLN
50.0000 ug | Freq: Once | INTRAMUSCULAR | Status: AC
  Administered 2012-05-12: 50 ug via INTRAVENOUS
  Filled 2012-05-12: qty 2

## 2012-05-12 MED ORDER — ONDANSETRON HCL 4 MG/2ML IJ SOLN: 4.0000 mg | Freq: Four times a day (QID) | INTRAMUSCULAR | Status: DC | PRN

## 2012-05-12 MED ORDER — HYDROMORPHONE HCL PF 1 MG/ML IJ SOLN
1.0000 mg | INTRAMUSCULAR | Status: DC | PRN
  Administered 2012-05-13 – 2012-05-14 (×5): 1 mg via INTRAVENOUS
  Filled 2012-05-12 (×5): qty 1

## 2012-05-12 NOTE — ED Notes (Signed)
Pt c/o severe abd pain since leaving here this am. Sts "they wanted to admit me, but I went home, I should have stayed." Hx metastatic breast and bone Ca. Dx with pancreatitis last night. D/c after pain got under control. Requesting admission at this time.

## 2012-05-12 NOTE — ED Notes (Signed)
Went to collect labs -  Pt would like to see Dr first.  RN aware.

## 2012-05-12 NOTE — ED Provider Notes (Signed)
History     CSN: [id number]  Arrival date & time 05/12/12  1410   First MD Initiated Contact with Patient 05/12/12 1702      Chief Complaint  Patient presents with  . Emesis    (Consider location/radiation/quality/duration/timing/severity/associated sxs/prior treatment) HPI Comments: Patient with a history of metastatic breast cancer comes in today with a chief complaint of abdominal pain.  Abdominal pain is diffuse and does not radiate.  She was seen in the ED yesterday for the same.  At that time the hospitalist had been consulted for admission.  The patient then told the hospitalist that her pain had resolved and that she wanted to be discharged.  Today the pain returned.  She reports that the pain is the same pain that she has had in the past.  She tried taking oral Dilaudid at home for the pain, but does not feel that it helped.  She also reports that she had 5-6 episodes of vomiting today.  She took Zofran at home for this also, but does not feel that it helped.  She was admitted on 04/23/12 for similar symptoms.  At that time a CT was done of her abdomen, which showed metastatic disease of her liver and also acute pancreatitis.  Patient is a 46 y.o. female presenting with abdominal pain. The history is provided by the patient.  Abdominal Pain The primary symptoms of the illness include abdominal pain and nausea. The primary symptoms of the illness do not include shortness of breath.  The patient has not had a change in bowel habit. Symptoms associated with the illness do not include chills, constipation, urgency, hematuria or frequency.    Past Medical History  Diagnosis Date  . Breast lump   . Thyroid disease   . Hypertension   . Hx of radiation therapy 06/2010, 10/2009, 11/2011    L breast, T3-T12 spine, L-S spine  . 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 Breast cancer 09/2009    L breast, ER+, PR-, Her 2 -    Past Surgical History  Procedure Date  . Knee arthroscopy     left   . Breast surgery     left lumpectomy  . Knee cartilage surgery 2006    No family history on file.  History  Substance Use Topics  . Smoking status: Former Smoker    Quit date: 08/25/2007  . Smokeless tobacco: Not on file  . Alcohol Use: No    OB History    Grav Para Term Preterm Abortions TAB SAB Ect Mult Living                  Review of Systems  Constitutional: Negative for chills.  Respiratory: Negative for shortness of breath.   Cardiovascular: Negative for chest pain.  Gastrointestinal: Positive for nausea and abdominal pain. Negative for constipation, blood in stool and abdominal distention.  Genitourinary: Negative for urgency, frequency and hematuria.    Allergies  Review of patient's allergies indicates no known allergies.  Home Medications   Current Outpatient Rx  Name Route Sig Dispense Refill  . ANASTROZOLE 1 MG PO TABS Oral Take 1 tablet (1 mg total) by mouth daily. 30 tablet 5  . DIPHENHYDRAMINE HCL 25 MG PO TABS Oral Take 25 mg by mouth every 6 (six) hours as needed. For anxiety/sleep.    Marland Kitchen FLUCONAZOLE 100 MG PO TABS Oral Take 100 mg by mouth daily.    Marland Kitchen HYDROMORPHONE HCL 2 MG PO TABS Oral  Take 1 tablet (2 mg total) by mouth every 4 (four) hours as needed for pain. For pain 84 tablet 0  . LEVOTHYROXINE SODIUM 150 MCG PO TABS Oral Take 150 mcg by mouth daily.    Marland Kitchen LORAZEPAM 0.5 MG PO TABS Oral Take 0.5 mg by mouth daily. For anxiety    . MEGESTROL ACETATE 40 MG/ML PO SUSP Oral Take 5 mLs (200 mg total) by mouth daily. 240 mL 2  . METOCLOPRAMIDE HCL 10 MG PO TABS Oral Take 10 mg by mouth 4 (four) times daily.     Marland Kitchen OMEPRAZOLE 20 MG PO CPDR Oral Take 20 mg by mouth daily.     Marland Kitchen ONDANSETRON 4 MG PO TBDP Oral Take 4 mg by mouth every 8 (eight) hours as needed. For nausea.    . OXYCODONE-ACETAMINOPHEN 5-325 MG PO TABS Oral Take 1-2 tablets by mouth every 8 (eight) hours as needed. For pain.    Marland Kitchen PRESCRIPTION MEDICATION Intravenous Inject into the vein every  28 (twenty-eight) days. Zometa once monthly.    Marland Kitchen PROMETHAZINE HCL 25 MG PO TABS Oral Take 25 mg by mouth every 6 (six) hours as needed. Nausea    . TRAMADOL HCL 50 MG PO TABS Oral Take 1 tablet (50 mg total) by mouth every 6 (six) hours as needed. For pain 120 tablet 0  . VENLAFAXINE HCL ER 75 MG PO CP24 Oral Take 2 capsules (150 mg total) by mouth daily. 30 capsule 12    BP 173/93  Pulse 75  Temp 98.4 F (36.9 C) (Oral)  Resp 20  SpO2 99%  LMP 08/25/2007  Physical Exam  Nursing note and vitals reviewed. Constitutional: She appears well-developed and well-nourished.       Uncomfortable appearing  HENT:  Head: Normocephalic and atraumatic.  Mouth/Throat: Oropharynx is clear and moist.  Neck: Normal range of motion. Neck supple.  Cardiovascular: Normal rate, regular rhythm and normal heart sounds.   Pulmonary/Chest: Effort normal and breath sounds normal.  Abdominal: Soft. Bowel sounds are normal. She exhibits no distension and no mass. There is generalized tenderness. There is no rigidity, no rebound and no guarding.  Musculoskeletal: Normal range of motion.  Neurological: She is alert.  Skin: Skin is warm and dry.  Psychiatric: She has a normal mood and affect.    ED Course  Procedures (including critical care time)   Labs Reviewed  CBC WITH DIFFERENTIAL  COMPREHENSIVE METABOLIC PANEL  LIPASE, BLOOD  URINALYSIS, ROUTINE W REFLEX MICROSCOPIC   No results found.   No diagnosis found.  6:32 PM Reassessed patient.  She reports that her pain has improved at this time.  Nausea has also improved. 8:49 PM Discussed with [facility].  They report that they will come down and see the patient in 10 minutes and admit.  MDM  Patient with a history of metastatic breast cancer presents today with abdominal pain.  Patient was seen in the ED yesterday for the same and discharged home after her pain improved.  She states that she was taking Dilaudid at home and also Zofran,  but did not feel that it was helping her symptoms.  Labs today stable.  Patient admitted for pain management.          Pascal Lux Dana, PA-C 05/13/12 [phone]

## 2012-05-12 NOTE — H&P (Signed)
[facility] History and Physical  Kristin [name] ZOX:[id number] DOB: November 06, 1965 DOA: 05/12/2012   PCP: [name], MD   Chief Complaint: Uncontrolled abdominal pain  HPI:  46 year old female with a history of metastatic breast cancer to bone, liver, and lymph nodes. The patient presents with three-day history of uncontrolled abdominal pain with nausea and vomiting. The patient presented to the emergency department yesterday with similar symptoms. She was seen by Dr. Houston Siren, but by the time his assessment the patient significantly improved and the patient was discharged home from the emergency department.  The patient presents again today with similar symptoms of abdominal pain, nausea and vomiting. She states that she took 2 doses of her oral Dilaudid at home in addition to tramadol x2 doses, and this did not help her pain whatsoever. In the emergency department, the patient was given 2 doses of Dilaudid as well as 50 mcg of fentanyl. The patient states that this brought her pain down from 10/10 to 4/10. The patient states that she is "comfortable" at this time. However she still is very apprehensive about going home at this time for fear of recurrence of her pain. She states that she last had a bowel movement yesterday. The patient also complains of some hematemesis that she noted today when she states that she vomited 7 times. However, the patient has not had any emesis during her entire stay in the emergency department. Acute abdominal series showed nonspecific bowel gas pattern.  Patient has been complaining of subjective fevers and chills since yesterday without take her temperature at home. She denies any dysuria, hematuria, hematochezia, melena. She denies any headaches, visual changes, chest pain, shortness of breath, palpitations. Patient is able to ambulate without significant worsening in her hip pain or leg pain. Assessment/Plan:  Intractable abdominal pain -This appears  to be quite intermittent; well controlled at the time of my examination -In fact, patient states that she wants to eat some [place] food at a buffet -Physical exam was benign; pain out of proportion with physical findings -04/23/2012 CT abdomen showed signs of pancreatitis, the patient currently very stable without vomiting wanting to eat -Certainly, the patient declines clinically with worsening pain repeat CT of the abdomen will need to be entertained -Start patient on long-acting opioids which may help with the waxing and waning episodes of severe abdominal pain--MS Contin 15mg  bid -Continue IV Dilaudid for severe breakthrough pain -place patient in observation status -Continue IV fluids -Continue cathartics -Check UA and urine culture -Check ionized calcium Pathologic fracture pubic bone-new -new finding on abdominal series -Clinically, the patient does not have any pain over the site. Patient able to ambulate without much difficulty -May need to reconsult oncology for continued need of palliative XRT -She had recent palliative XRT to the right hip September 2013, last treatment 9/24 Hematemesis -Monitor hemoglobin with the understanding that there maybe dilutional effect due to fluids -No emesis during entire stay in the emergency department -Check Hemoccult Metastatic breast cancer -Oncologist is Dr. Darnelle Catalan -Had recent followup on 05/09/2012--continue goserelin, added anastrozole -Status post XRT to right hip mets, sept 2013 Microcytic anemia -Check iron studies Hyponatremia--mild -Suspect that this may be due to hypovolemia vs. paraneoplastic -Continue normal saline Pleural parenchymal densities on chest x-ray -Suspect may be related to metastasis -No signs or symptoms of pneumonia at this time, will not start abx       Past Medical History  Diagnosis Date  . Breast lump   . Thyroid disease   .  Hypertension   . Hx of radiation therapy 06/2010, 10/2009, 11/2011    L  breast, T3-T12 spine, L-S spine  . 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 Breast cancer 09/2009    L breast, ER+, PR-, Her 2 -   Past Surgical History  Procedure Date  . Knee arthroscopy     left  . Breast surgery     left lumpectomy  . Knee cartilage surgery 2006   Social History:  reports that she quit smoking about 4 years ago. She does not have any smokeless tobacco history on file. She reports that she does not drink alcohol or use illicit drugs.  No Known Allergies  History reviewed. No pertinent family history.  Prior to Admission medications   Medication Sig Start Date End Date Taking? Authorizing Provider  anastrozole (ARIMIDEX) 1 MG tablet Take 1 tablet (1 mg total) by mouth daily. 05/09/12  Yes Amy Allegra Grana, PA  diphenhydrAMINE (BENADRYL) 25 MG tablet Take 25 mg by mouth every 6 (six) hours as needed. For anxiety/sleep. 04/22/12 05/22/12 Yes Tia Oliveri, PA-C  fluconazole (DIFLUCAN) 100 MG tablet Take 100 mg by mouth daily.   Yes Historical Provider, MD  HYDROmorphone (DILAUDID) 2 MG tablet Take 1 tablet (2 mg total) by mouth every 4 (four) hours as needed for pain. For pain 05/09/12  Yes Amy Allegra Grana, PA  levothyroxine (SYNTHROID, LEVOTHROID) 150 MCG tablet Take 150 mcg by mouth daily.   Yes Historical Provider, MD  LORazepam (ATIVAN) 0.5 MG tablet Take 0.5 mg by mouth daily. For anxiety 03/16/12  Yes [name], MD  megestrol (MEGACE ORAL) 40 MG/ML suspension Take 5 mLs (200 mg total) by mouth daily. 05/09/12  Yes Amy Allegra Grana, PA  metoCLOPramide (REGLAN) 10 MG tablet Take 10 mg by mouth 4 (four) times daily.  04/27/12  Yes Historical Provider, MD  omeprazole (PRILOSEC) 20 MG capsule Take 20 mg by mouth daily.  04/19/12  Yes Historical Provider, MD  ondansetron (ZOFRAN-ODT) 4 MG disintegrating tablet Take 4 mg by mouth every 8 (eight) hours as needed. For nausea. 04/19/12  Yes Historical Provider, MD  oxyCODONE-acetaminophen (PERCOCET/ROXICET) 5-325 MG per tablet Take 1-2 tablets  by mouth every 8 (eight) hours as needed. For pain.   Yes Historical Provider, MD  PRESCRIPTION MEDICATION Inject into the vein every 28 (twenty-eight) days. Zometa once monthly.   Yes Historical Provider, MD  promethazine (PHENERGAN) 25 MG tablet Take 25 mg by mouth every 6 (six) hours as needed. Nausea   Yes Historical Provider, MD  traMADol (ULTRAM) 50 MG tablet Take 1 tablet (50 mg total) by mouth every 6 (six) hours as needed. For pain 03/31/12  Yes [name], MD  venlafaxine XR (EFFEXOR-XR) 75 MG 24 hr capsule Take 2 capsules (150 mg total) by mouth daily. 03/31/12  Yes [name], MD    Review of Systems:  Constitutional:  No weight loss, night sweats, Fevers, chills, fatigue.  Head&Eyes: No headache.  No vision loss.  No eye pain or scotoma ENT:  No Difficulty swallowing,Tooth/dental problems,Sore throat,  No ear ache, post nasal drip,  Cardio-vascular:  No chest pain, Orthopnea, PND, swelling in lower extremities,  dizziness, palpitations  GI:  No  , diarrhea, loss of appetite, hematochezia, melena, heartburn, indigestion, Resp:  No shortness of breath with exertion or at rest. No cough. No coughing up of blood .No wheezing.No chest wall deformity  Skin:  no rash or lesions.  GU:  no  dysuria, change in color of urine, no urgency or frequency. No flank pain.  Musculoskeletal:  No decreased range of motion. No back pain.  Psych:  No change in mood or affect. No depression or anxiety. Neurologic: No headache, no dysesthesia, no focal weakness, no vision loss. No syncope  Physical Exam: Filed Vitals:   05/12/12 1422 05/12/12 1854  BP: 173/93 162/81  Pulse: 75 71  Temp: 98.4 F (36.9 C) 97.9 F (36.6 C)  TempSrc: Oral Oral  Resp: 20 24  SpO2: 99% 96%   General:  A&O x 3, NAD, nontoxic, pleasant/cooperative Head/Eye: No conjunctival hemorrhage, no icterus, [place]/AT, No nystagmus ENT:  No icterus,  No thrush, good dentition, no pharyngeal exudate; partial  dentures present upper and lower Neck:  No masses, no lymphadenpathy, no bruits CV:  RRR, no rub, no gallop, no S3 Lung:  CTAB, good air movement, no wheeze, no rhonchi Abdomen: soft/, +BS, nondistended, no peritoneal signs; mild epigastric tenderness to palpation. Minimal pain to palpation when distracted. No pain over her suprapubic area. Ext: No cyanosis, No rashes, No petechiae, No lymphangitis, trace lower extremity edema   Labs on Admission:  Basic Metabolic Panel:  Lab 05/12/12 1610 05/11/12 2024 05/11/12 2015 05/09/12 1402  NA 131* 139 135 136  K 3.8 3.9 -- --  CL 98 105 100 102  CO2 22 -- 26 26  GLUCOSE 92 118* 120* 89  BUN 8 13 12  18.0  CREATININE 0.62 1.00 0.77 0.9  CALCIUM 9.5 -- 10.3 11.1*  MG -- -- -- --  PHOS -- -- -- --   Liver Function Tests:  Lab 05/12/12 1826 05/11/12 2015 05/09/12 1402  AST 21 17 15   ALT 11 9 9   ALKPHOS 70 75 73  BILITOT 0.3 0.2* 0.30  PROT 7.5 7.8 6.9  ALBUMIN 3.0* 3.3* 2.9*    Lab 05/12/12 1826 05/11/12 2015  LIPASE 38 94*  AMYLASE -- --   No results found for this basename: AMMONIA:5 in the last 168 hours CBC:  Lab 05/12/12 1826 05/11/12 2024 05/11/12 2015 05/09/12 1402  WBC 5.0 -- 6.3 5.2  NEUTROABS 4.1 -- 5.2 3.8  HGB 10.1* 11.9* 9.9* 8.9*  HCT 30.3* 35.0* 30.3* 27.3*  MCV 75.4* -- 75.2* 76.3*  PLT 359 -- 415* 337   Cardiac Enzymes: No results found for this basename: CKTOTAL:5,CKMB:5,CKMBINDEX:5,TROPONINI:5 in the last 168 hours BNP: No components found with this basename: POCBNP:5 CBG: No results found for this basename: GLUCAP:5 in the last 168 hours  Radiological Exams on Admission: Dg Abd Acute W/chest  05/12/2012  *RADIOLOGY REPORT*  Clinical Data: Abdominal pain, breast carcinoma  ACUTE ABDOMEN SERIES (ABDOMEN 2 VIEW & CHEST 1 VIEW)  Comparison: 04/23/2012 and earlier studies  Findings: Asymmetric pleuroparenchymal thickening at the left lung apex.  Coarse perihilar interstitial opacities.  Linear  scarring/atelectasis at the left lung base as before.  Vascular clips in the left breast.  Heart size normal.  No effusion.  No free air.  Small bowel decompressed.  Normal distribution of gas and stool throughout colon.  Left pelvic phleboliths.  Sclerotic osseous lesions in the lower lumbar spine, pelvis, and left femoral neck are again noted.  There is a right pubic fracture which was not clearly evident on the prior scan.  IMPRESSION:  1.  New pathologic fracture of the right pubic bone. 2.  Normal bowel gas pattern. 3.  No free air. 4.  Persistent   left apical pleuroparenchymal disease with worsening perihilar interstitial opacities.  Original Report Authenticated By: Osa Craver, M.D.         Time spent:70 minutes     Geriann Lafont, DO  [facility] Pager [phone]  If 7PM-7AM, please contact night-coverage [email or web address] Password [email or web address] 05/12/2012, 9:53 PM

## 2012-05-12 NOTE — ED Notes (Signed)
Per EMS, vomiting since yesterday-was treated in ER yesterday

## 2012-05-13 DIAGNOSIS — E871 Hypo-osmolality and hyponatremia: Secondary | ICD-10-CM

## 2012-05-13 DIAGNOSIS — D649 Anemia, unspecified: Secondary | ICD-10-CM

## 2012-05-13 DIAGNOSIS — C800 Disseminated malignant neoplasm, unspecified: Secondary | ICD-10-CM

## 2012-05-13 DIAGNOSIS — C50919 Malignant neoplasm of unspecified site of unspecified female breast: Secondary | ICD-10-CM

## 2012-05-13 DIAGNOSIS — R109 Unspecified abdominal pain: Principal | ICD-10-CM

## 2012-05-13 LAB — URINALYSIS, MICROSCOPIC ONLY
Glucose, UA: NEGATIVE mg/dL
Hgb urine dipstick: NEGATIVE
Leukocytes, UA: NEGATIVE
Protein, ur: NEGATIVE mg/dL
Urobilinogen, UA: 1 mg/dL (ref 0.0–1.0)

## 2012-05-13 LAB — CBC
Hemoglobin: 8.6 g/dL — ABNORMAL LOW (ref 12.0–15.0)
MCH: 25.7 pg — ABNORMAL LOW (ref 26.0–34.0)
MCV: 73.1 fL — ABNORMAL LOW (ref 78.0–100.0)
RBC: 3.34 MIL/uL — ABNORMAL LOW (ref 3.87–5.11)

## 2012-05-13 LAB — BASIC METABOLIC PANEL
CO2: 21 mEq/L (ref 19–32)
Glucose, Bld: 80 mg/dL (ref 70–99)
Potassium: 4.1 mEq/L (ref 3.5–5.1)
Sodium: 132 mEq/L — ABNORMAL LOW (ref 135–145)

## 2012-05-13 NOTE — Progress Notes (Signed)
[facility] PROGRESS NOTE  Jonnell Hentges YNW:[id number] DOB: [birth year] DOA: 05/12/2012 PCP: [name], MD  Brief narrative: 46 year old female with a history of metastatic breast cancer to bone, liver, and lymph nodes. The patient presents with three-day history of uncontrolled abdominal pain with nausea and vomiting.    Assessment/Plan:   Principal Problem: *Intractable abdominal pain  - continue supportive care with IV fluids - continue antiemetics PRN - patient reports feeling better - continue current pain regimen  Active Problems: Pathologic fracture pubic bone-new  - new finding on abdominal series  - Clinically, the patient does not have any pain over the site. Patient able to ambulate without much difficulty  - May need to reconsult oncology for continued need of palliative XRT  - She had recent palliative XRT to the right hip September 2013, last treatment 9/24   Hematemesis  - resolved  Metastatic breast cancer  - Oncologist is Dr. 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  Objective: Filed Vitals:   05/12/12 1422 05/12/12 1854 05/12/12 2320 05/13/12 0615  BP: 173/93 162/81 135/85 140/76  Pulse: 75 71 76 70  Temp: 98.4 F (36.9 C) 97.9 F (36.6 C)  98.1 F (36.7 C) 98.3 F (36.8 C)  TempSrc: Oral Oral Oral Oral  Resp: 20 24 20 18   Height:   5' 9.5" (1.765 m)   Weight:   67.8 kg (149 lb 7.6 oz)   SpO2: 99% 96% 97% 98%    Intake/Output Summary (Last 24 hours) at 05/13/12 0731 Last data filed at 05/13/12 0156  Gross per 24 hour  Intake    125 ml  Output    600 ml  Net   -475 ml    Exam:   General:  Pt is alert, follows commands appropriately, not in acute distress  Cardiovascular: Regular rate and rhythm, S1/S2, no murmurs, no rubs, no gallops  Respiratory: Clear to auscultation bilaterally, no wheezing, no crackles, no rhonchi  Abdomen: Soft,tender across mid abdomen, non distended, bowel sounds present, no guarding  Extremities: No edema, pulses DP and PT palpable bilaterally  Neuro: Grossly nonfocal  Data Reviewed: Basic Metabolic Panel:  Lab 05/12/12 6295 05/11/12 2024 05/11/12 2015 05/09/12 1402  NA 131* 139 135 136  K 3.8 3.9 3.8 4.5  CL 98 105 100 102  CO2 22 -- 26 26  GLUCOSE 92 118* 120* 89  BUN 8 13 12  18.0  CREATININE 0.62 1.00 0.77 0.9  CALCIUM 9.5 -- 10.3 11.1*   Liver Function Tests:  Lab 05/12/12 1826 05/11/12 2015 05/09/12 1402  AST 21 17 15   ALT 11 9 9   ALKPHOS 70 75 73  BILITOT 0.3 0.2* 0.30  PROT 7.5 7.8 6.9  ALBUMIN 3.0* 3.3* 2.9*    Lab 05/12/12 1826 05/11/12 2015  LIPASE 38 94*  AMYLASE -- --  CBC:  Lab 05/12/12 1826 05/11/12 2024 05/11/12 2015 05/09/12 1402  WBC 5.0 -- 6.3 5.2  HGB 10.1* 11.9* 9.9* 8.9*  HCT 30.3* 35.0* 30.3* 27.3*  MCV 75.4* -- 75.2* 76.3*  PLT 359 -- 415* 337    Studies: Dg Abd Acute W/chest 05/12/2012  * IMPRESSION:  1.  New pathologic fracture of the right pubic bone. 2.  Normal bowel gas pattern. 3.  No free air. 4.  Persistent   left apical pleuroparenchymal disease with worsening perihilar interstitial opacities.     Scheduled Meds:   . anastrozole  1 mg Oral Daily  . docusate sodium  100 mg Oral BID  . enoxaparin (LOVENOX)   40 mg  Subcutaneous QHS  . fentaNYL  50 mcg Intravenous Once  . levothyroxine  150 mcg Oral Daily  . LORazepam  0.5 mg Oral Daily  . metoCLOPramide  10 mg Oral QID  . morphine  15 mg Oral Q12H  . pantoprazole  40 mg Oral Q1200  . senna  2 tablet Oral QHS  . venlafaxine XR  150 mg Oral Daily   Continuous Infusions:   . sodium chloride 75 mL/hr at 05/13/12 0024

## 2012-05-13 NOTE — ED Provider Notes (Signed)
Medical screening examination/treatment/procedure(s) were conducted as a shared visit with non-physician practitioner(s) and myself.  I personally evaluated the patient during the encounter  Kristin [name] is a 46 y.o. female hx of metastatic breast Ca here with abdominal pain and vomiting. She was seen yesterday and wanted to go home on zofran and dilaudid. But the meds didn't help her and she is back with similar symptoms. She is admitted for dehydration and inability to tolerate PO. No signs of SBO and abdomen nontender.    Richardean Canal, MD 05/13/12 [phone]

## 2012-05-13 NOTE — Progress Notes (Signed)
Observation review is complete. 

## 2012-05-14 DIAGNOSIS — F411 Generalized anxiety disorder: Secondary | ICD-10-CM

## 2012-05-14 LAB — URINE CULTURE

## 2012-05-14 MED ORDER — HYDROMORPHONE HCL PF 2 MG/ML IJ SOLN
2.0000 mg | INTRAMUSCULAR | Status: DC | PRN
  Administered 2012-05-14 – 2012-05-15 (×6): 2 mg via INTRAVENOUS
  Filled 2012-05-14 (×6): qty 1

## 2012-05-14 NOTE — Consult Note (Addendum)
Reason for Consult: Pathologic fx pubic ramus Referring Physician: medicine  Kristin [name] is an 46 y.o. female.  HPI: Metastatic breast CA admitted with abdominal pain nausea. Incidental pathologic pubic ramus fracture right. Widespread metastatic disease  Past Medical History  Diagnosis Date  . Breast lump   . Thyroid disease   . Hypertension   . Hx of radiation therapy 06/2010, 10/2009, 11/2011    L breast, T3-T12 spine, L-S spine  . 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 Breast cancer 09/2009    L breast, ER+, PR-, Her 2 -    Past Surgical History  Procedure Date  . Knee arthroscopy     left  . Breast surgery     left lumpectomy  . Knee cartilage surgery 2006    History reviewed. No pertinent family history.  Social History:  reports that she quit smoking about 4 years ago. She does not have any smokeless tobacco history on file. She reports that she does not drink alcohol or use illicit drugs.  Allergies: No Known Allergies  Medications: reviewed  Results for orders placed during the hospital encounter of 05/12/12 (from the past 48 hour(s))  URINALYSIS, ROUTINE W REFLEX MICROSCOPIC     Status: Abnormal   Collection Time   05/12/12  5:28 PM      Component Value Range Comment   Color, Urine YELLOW  YELLOW    APPearance CLOUDY (*) CLEAR    Specific Gravity, Urine 1.014  1.005 - 1.030    pH 7.5  5.0 - 8.0    Glucose, UA NEGATIVE  NEGATIVE mg/dL    Hgb urine dipstick NEGATIVE  NEGATIVE    Bilirubin Urine NEGATIVE  NEGATIVE    Ketones, ur 15 (*) NEGATIVE mg/dL    Protein, ur NEGATIVE  NEGATIVE mg/dL    Urobilinogen, UA 1.0  0.0 - 1.0 mg/dL    Nitrite NEGATIVE  NEGATIVE    Leukocytes, UA NEGATIVE  NEGATIVE MICROSCOPIC NOT DONE ON URINES WITH NEGATIVE PROTEIN, BLOOD, LEUKOCYTES, NITRITE, OR GLUCOSE <1000 mg/dL.  CBC WITH DIFFERENTIAL     Status: Abnormal   Collection Time   05/12/12  6:26 PM      Component Value Range Comment   WBC 5.0  4.0 - 10.5 K/uL    RBC 4.02  3.87  - 5.11 MIL/uL    Hemoglobin 10.1 (*) 12.0 - 15.0 g/dL    HCT 16.1 (*) 09.6 - 46.0 %    MCV 75.4 (*) 78.0 - 100.0 fL    MCH 25.1 (*) 26.0 - 34.0 pg    MCHC 33.3  30.0 - 36.0 g/dL    RDW 04.5 (*) 40.9 - 15.5 %    Platelets 359  150 - 400 K/uL    Neutrophils Relative 83 (*) 43 - 77 %    Neutro Abs 4.1  1.7 - 7.7 K/uL    Lymphocytes Relative 11 (*) 12 - 46 %    Lymphs Abs 0.5 (*) 0.7 - 4.0 K/uL    Monocytes Relative 6  3 - 12 %    Monocytes Absolute 0.3  0.1 - 1.0 K/uL    Eosinophils Relative 0  0 - 5 %    Eosinophils Absolute 0.0  0.0 - 0.7 K/uL    Basophils Relative 0  0 - 1 %    Basophils Absolute 0.0  0.0 - 0.1 K/uL   COMPREHENSIVE METABOLIC PANEL     Status: Abnormal   Collection Time   05/12/12  6:26 PM      Component Value Range Comment   Sodium 131 (*) 135 - 145 mEq/L DELTA CHECK NOTED   Potassium 3.8  3.5 - 5.1 mEq/L    Chloride 98  96 - 112 mEq/L    CO2 22  19 - 32 mEq/L    Glucose, Bld 92  70 - 99 mg/dL    BUN 8  6 - 23 mg/dL    Creatinine, Ser 8.41  0.50 - 1.10 mg/dL DELTA CHECK NOTED   Calcium 9.5  8.4 - 10.5 mg/dL    Total Protein 7.5  6.0 - 8.3 g/dL    Albumin 3.0 (*) 3.5 - 5.2 g/dL    AST 21  0 - 37 U/L    ALT 11  0 - 35 U/L    Alkaline Phosphatase 70  39 - 117 U/L    Total Bilirubin 0.3  0.3 - 1.2 mg/dL    GFR calc non Af Amer >90  >90 mL/min    GFR calc Af Amer >90  >90 mL/min   LIPASE, BLOOD     Status: Normal   Collection Time   05/12/12  6:26 PM      Component Value Range Comment   Lipase 38  11 - 59 U/L   URINE CULTURE     Status: Normal   Collection Time   05/13/12 12:40 AM      Component Value Range Comment   Specimen Description URINE, CLEAN CATCH      Special Requests NONE      Culture  Setup Time 05/13/2012 05:08      Colony Count 20,OOO COLONIES/ML      Culture        Value: Multiple bacterial morphotypes present, none predominant. Suggest appropriate recollection if clinically indicated.   Report Status 05/14/2012 FINAL     URINALYSIS,  MICROSCOPIC ONLY     Status: Abnormal   Collection Time   05/13/12 12:40 AM      Component Value Range Comment   Color, Urine YELLOW  YELLOW    APPearance CLOUDY (*) CLEAR    Specific Gravity, Urine 1.018  1.005 - 1.030    pH 6.5  5.0 - 8.0    Glucose, UA NEGATIVE  NEGATIVE mg/dL    Hgb urine dipstick NEGATIVE  NEGATIVE    Bilirubin Urine NEGATIVE  NEGATIVE    Ketones, ur TRACE (*) NEGATIVE mg/dL    Protein, ur NEGATIVE  NEGATIVE mg/dL    Urobilinogen, UA 1.0  0.0 - 1.0 mg/dL    Nitrite NEGATIVE  NEGATIVE    Leukocytes, UA NEGATIVE  NEGATIVE    WBC, UA 0-2  <3 WBC/hpf    Squamous Epithelial / LPF FEW (*) RARE    Urine-Other MUCOUS PRESENT     BASIC METABOLIC PANEL     Status: Abnormal   Collection Time   05/13/12  9:18 AM      Component Value Range Comment   Sodium 132 (*) 135 - 145 mEq/L    Potassium 4.1  3.5 - 5.1 mEq/L    Chloride 101  96 - 112 mEq/L    CO2 21  19 - 32 mEq/L    Glucose, Bld 80  70 - 99 mg/dL    BUN 8  6 - 23 mg/dL    Creatinine, Ser 3.24  0.50 - 1.10 mg/dL    Calcium 9.1  8.4 - 40.1 mg/dL    GFR calc non Af Amer >90  >90  mL/min    GFR calc Af Amer >90  >90 mL/min   CBC     Status: Abnormal   Collection Time   05/13/12  9:18 AM      Component Value Range Comment   WBC 5.8  4.0 - 10.5 K/uL    RBC 3.34 (*) 3.87 - 5.11 MIL/uL    Hemoglobin 8.6 (*) 12.0 - 15.0 g/dL    HCT 16.1 (*) 09.6 - 46.0 %    MCV 73.1 (*) 78.0 - 100.0 fL    MCH 25.7 (*) 26.0 - 34.0 pg    MCHC 35.2  30.0 - 36.0 g/dL    RDW 04.5 (*) 40.9 - 15.5 %    Platelets 363  150 - 400 K/uL     Dg Abd Acute W/chest  05/12/2012  *RADIOLOGY REPORT*  Clinical Data: Abdominal pain, breast carcinoma  ACUTE ABDOMEN SERIES (ABDOMEN 2 VIEW & CHEST 1 VIEW)  Comparison: 04/23/2012 and earlier studies  Findings: Asymmetric pleuroparenchymal thickening at the left lung apex.  Coarse perihilar interstitial opacities.  Linear scarring/atelectasis at the left lung base as before.  Vascular clips in the left  breast.  Heart size normal.  No effusion.  No free air.  Small bowel decompressed.  Normal distribution of gas and stool throughout colon.  Left pelvic phleboliths.  Sclerotic osseous lesions in the lower lumbar spine, pelvis, and left femoral neck are again noted.  There is a right pubic fracture which was not clearly evident on the prior scan.  IMPRESSION:  1.  New pathologic fracture of the right pubic bone. 2.  Normal bowel gas pattern. 3.  No free air. 4.  Persistent   left apical pleuroparenchymal disease with worsening perihilar interstitial opacities.   Original Report Authenticated By: Osa Craver, M.D.     Review of Systems  Constitutional: Positive for weight loss and malaise/fatigue.  Gastrointestinal: Positive for nausea and abdominal pain.  All other systems reviewed and are negative.   Blood pressure 132/89, pulse 89, temperature 98.1 F (36.7 C), temperature source Oral, resp. rate 18, height 5' 9.5" (1.765 m), weight 67.8 kg (149 lb 7.6 oz), last menstrual period 08/25/2007, SpO2 96.00%. Physical Exam  Constitutional: She is oriented to person, place, and time.  HENT:  Head: Normocephalic.  Eyes: Pupils are equal, round, and reactive to light.  Neck: Normal range of motion.  Cardiovascular: Normal rate.   Respiratory: Breath sounds normal.  GI: Soft. There is no rebound and no guarding.  Musculoskeletal: She exhibits no tenderness.  Neurological: She is alert and oriented to person, place, and time.  Skin: Skin is warm and dry.  Psychiatric: She has a normal mood and affect.  Per Dr. Elisabeth Pigeon Does have pain to palpation right groin.  Assessment/Plan: Symptomatic pathologic pubic ramus fracture right. More of a concern is lytic involvement of right acetabulum. Also widespread metastatic disease. Would recommend protective weight bearing on right with cane or walker as tolerated and follow up. Consult XRT Consult PT. Discussed with primary  service.  Heleena Miceli C   [phone]. Beeper [phone] 05/14/2012, 2:50 PM

## 2012-05-14 NOTE — Progress Notes (Signed)
Nutrition Brief Note  Patient identified on the Malnutrition Screening Tool (MST) report for Unintentional weight loss, generating a score of 2.   Body mass index is 21.76 kg/(m^2). Pt meets criteria for Normal weight based on current BMI.  6.2% weight loss in 2 months not classified as significant % weight loss. Wt Readings from Last 10 Encounters:  05/12/12 149 lb 7.6 oz (67.8 kg)  05/09/12 152 lb 3.2 oz (69.037 kg)  05/01/12 149 lb 4.8 oz (67.722 kg)  04/26/12 151 lb (68.493 kg)  03/31/12 154 lb 12.8 oz (70.217 kg)  03/14/12 159 lb 1.6 oz (72.167 kg)  12/29/11 160 lb 6.4 oz (72.757 kg)  12/14/11 161 lb 12.8 oz (73.392 kg)  12/01/11 160 lb 9.6 oz (72.848 kg)  11/15/11 160 lb 14.4 oz (72.984 kg)   Current diet order is Regular, patient is consuming approximately 80% of meals at this time. Labs and medications reviewed.   No nutrition interventions warranted at this time. If nutrition issues arise, please consult RD.   Leonette Most RD, LDN

## 2012-05-14 NOTE — ED Provider Notes (Signed)
Medical screening examination/treatment/procedure(s) were conducted as a shared visit with non-physician practitioner(s) and myself.  I personally evaluated the patient during the encounter   Loren Racer, MD 05/14/12 [phone]

## 2012-05-14 NOTE — Progress Notes (Signed)
[facility] PROGRESS NOTE  Emmy Charbonneau YQM:[id number] DOB: 07-Jan-1966 DOA: 05/12/2012 PCP: [name], MD  Brief narrative: 46 year old female with a history of metastatic breast cancer to bone, liver, and lymph nodes. The patient presents with three-day history of uncontrolled abdominal pain with nausea and vomiting.   Assessment/Plan:   Principal Problem:  *Intractable abdominal pain  - continue supportive care with IV fluids  - continue antiemetics PRN  - increased dilaudid to 2 mg Q 4 hours PRN IV severe pain  Active Problems:  Pathologic fracture pubic bone-new  - new finding on abdominal series  - appreciate ortho consult - Clinically, the patient does not have any pain over the site. 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  Objective: Filed Vitals:   05/13/12 0615 05/13/12 1513 05/13/12 2037 05/14/12 0445  BP: 140/76 152/91 151/89 125/78  Pulse: 70 86 82 81  Temp: 98.3 F (36.8 C) 98.8 F (37.1 C) 98.3 F (36.8 C) 98.2 F (36.8 C)  TempSrc: Oral Oral Oral Oral  Resp: 18 18 17 20   Height:      Weight:      SpO2: 98% 99% 98% 96%    Intake/Output Summary  (Last 24 hours) at 05/14/12 1022 Last data filed at 05/14/12 0900  Gross per 24 hour  Intake   3150 ml  Output    900 ml  Net   2250 ml    Exam:   General:  Pt is alert, follows commands appropriately, not in acute distress  Cardiovascular: Regular rate and rhythm, S1/S2, no murmurs, no rubs, no gallops  Respiratory: Clear to auscultation bilaterally, no wheezing, no crackles, no rhonchi  Abdomen: Soft, non tender, non distended, bowel sounds present, no guarding  Extremities: No edema, pulses DP and PT palpable bilaterally  Neuro: Grossly nonfocal  Data Reviewed: Basic Metabolic Panel:  Lab 05/13/12 4401 05/12/12 1826 05/11/12 2024 05/11/12 2015 05/09/12 1402  NA 132* 131* 139 135 136  K 4.1 3.8 3.9 3.8 4.5  CL 101 98 105 100 102  CO2 21 22 -- 26 26  GLUCOSE 80 92 118* 120* 89  BUN 8 8 13 12  18.0  CREATININE 0.76 0.62 1.00 0.77 0.9  CALCIUM 9.1 9.5 -- 10.3 11.1*   Liver Function Tests:  Lab 05/12/12 1826 05/11/12 2015 05/09/12 1402  AST 21 17 15   ALT 11 9 9   ALKPHOS 70 75 73  BILITOT 0.3 0.2* 0.30  PROT 7.5 7.8 6.9  ALBUMIN 3.0* 3.3* 2.9*    Lab 05/12/12 1826 05/11/12 2015  LIPASE 38 94*  AMYLASE -- --   CBC:  Lab 05/13/12 0918 05/12/12 1826 05/11/12 2024 05/11/12 2015 05/09/12 1402  WBC 5.8 5.0 --  6.3 5.2  NEUTROABS -- 4.1 -- 5.2 3.8  HGB 8.6* 10.1* 11.9* 9.9* 8.9*  HCT 24.4* 30.3* 35.0* 30.3* 27.3*  MCV 73.1* 75.4* -- 75.2* 76.3*  PLT 363 359 -- 415* 337     Studies: Dg Abd Acute W/chest 05/12/2012  * IMPRESSION:  1.  New pathologic fracture of the right pubic bone. 2.  Normal bowel gas pattern. 3.  No free air. 4.  Persistent   left apical pleuroparenchymal disease with worsening perihilar interstitial opacities.        Scheduled Meds:   . anastrozole  1 mg Oral Daily  . docusate sodium  100 mg Oral BID  . enoxaparin (LOVENOX) injection  40 mg Subcutaneous QHS  . levothyroxine  150 mcg Oral Daily  . LORazepam  0.5 mg Oral Daily  .  metoCLOPramide  10 mg Oral QID  . morphine  15 mg Oral Q12H  . pantoprazole  40 mg Oral Q1200  . senna  2 tablet Oral QHS  . venlafaxine XR  150 mg Oral Daily   Continuous Infusions:   . sodium chloride 75 mL/hr at 05/13/12 1410

## 2012-05-15 MED ORDER — HYDROMORPHONE HCL 2 MG PO TABS: 4.0000 mg | ORAL_TABLET | ORAL | Status: DC | PRN

## 2012-05-15 MED ORDER — ONDANSETRON HCL 4 MG PO TABS: 4.0000 mg | ORAL_TABLET | Freq: Four times a day (QID) | ORAL | Status: DC | PRN

## 2012-05-15 MED ORDER — DIPHENHYDRAMINE HCL 25 MG PO TABS: 25.0000 mg | ORAL_TABLET | Freq: Four times a day (QID) | ORAL | Status: DC | PRN

## 2012-05-15 MED ORDER — LORAZEPAM 0.5 MG PO TABS: 0.5000 mg | ORAL_TABLET | Freq: Every day | ORAL | Status: DC

## 2012-05-15 MED ORDER — DSS 100 MG PO CAPS: 100.0000 mg | ORAL_CAPSULE | Freq: Two times a day (BID) | ORAL | Status: DC

## 2012-05-15 MED ORDER — MORPHINE SULFATE ER 15 MG PO TBCR: 15.0000 mg | EXTENDED_RELEASE_TABLET | Freq: Two times a day (BID) | ORAL | Status: DC

## 2012-05-15 MED ORDER — OXYCODONE-ACETAMINOPHEN 5-325 MG PO TABS: 1.0000 | ORAL_TABLET | Freq: Three times a day (TID) | ORAL | Status: DC | PRN

## 2012-05-15 NOTE — Progress Notes (Signed)
Pt D/C  Home.  D/C instructions done, medication administration  Done . Pt verbalizes understanding. Pt is stable, vitals within normal range for pt.

## 2012-05-15 NOTE — Discharge Summary (Signed)
Physician Discharge Summary  Kristin [name] ZOX:[id number] DOB: Jan 07, 1966 DOA: 05/12/2012  PCP: [name], MD  Admit date: 05/12/2012 Discharge date: 05/15/2012  Recommendations for Outpatient Follow-up:  1. 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 The patient presents with three-day history of uncontrolled abdominal pain with nausea and vomiting.  Patient found to incidental new pathological pubic fracture. Ortho consulted and recommendation was to use cane to provide more stability on ambulation but since patient did not have any pain or difficulty ambulating no intervention was recommended at this time. This was communicated to the patient and her mother and I have encouraged her to use cane and if any new pain at the site to call PCP immediately as it may indicate a fracture.  Assessment/Plan:   Principal Problem:  *Intractable abdominal pain  - controlled with current analgesia  Active Problems:  Pathologic fracture pubic bone-new  - new finding on abdominal series  - please refer to my note above in regards to this finding - Clinically, the patient does not have any pain over the site. Patient able to ambulate without much difficulty  - She had recent palliative XRT to the right hip September 2013, last treatment 9/24  - I left a message to Dr. 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   Pleural parenchymal densities on chest x-ray  - Due to metastasis   Code Status: full code  Family Communication: family not at bedside  Disposition Plan: home today  Manson Passey, MD  [facility]  Pager [phone]  Discharge Exam: Filed Vitals:   05/15/12 0635  BP: 128/78  Pulse: 82  Temp: 98.1 F (36.7 C)  Resp: 19   Filed Vitals:   05/14/12 0445 05/14/12 1438 05/14/12 2139 05/15/12 0635  BP: 125/78 132/89 125/80 128/78  Pulse: 81 89 95 82  Temp: 98.2 F (36.8 C) 98.1 F (36.7 C) 97.9 F (36.6 C) 98.1 F (36.7 C)  TempSrc: Oral Oral Oral Oral  Resp: 20 18 18 19   Height:      Weight:      SpO2: 96% 96% 96% 96%    General: Pt is alert, follows commands appropriately, not in acute distress Cardiovascular: Regular rate and rhythm, S1/S2 +, no murmurs, no rubs, no gallops Respiratory: Clear to auscultation bilaterally, no wheezing, no crackles, no rhonchi Abdominal: Soft, non tender, non distended, bowel sounds +, no guarding Extremities: no edema, no cyanosis, pulses palpable bilaterally DP and PT Neuro: Grossly nonfocal  Discharge Instructions  Discharge Orders    Future Appointments: Provider: Department: Dept Phone: Center:   05/30/2012 3:40 PM Maryln Gottron, MD [facility] [phone] None   06/06/2012 1:30 PM Beverely Pace Shumate [facility] [phone] None   06/06/2012 1:45 PM  Catalina Gravel, PA [facility] [phone] None   06/06/2012 2:30 PM [facility] [phone] None   06/27/2012 11:30 AM Sherrie Mustache [facility] [phone] None   06/27/2012 12:00 PM [name], MD [facility] [phone] None   07/04/2012 1:30 PM Delcie Roch [facility]  [phone] None   07/04/2012 2:00 PM [facility] [phone] None   07/25/2012 11:00 AM Windell Hummingbird [facility] [phone] None   08/01/2012 11:15 AM Dava Najjar Idelle Jo [facility] [phone] None   08/01/2012 11:45 AM [facility] [phone] None   08/29/2012 11:00 AM Windell Hummingbird [facility] [phone] None   09/26/2012 11:00 AM Windell Hummingbird [facility] [phone] None     Future Orders Please Complete By Expires   Diet - low sodium heart healthy      Increase activity slowly      Call MD for:  persistant nausea and vomiting      Call MD for:  severe uncontrolled pain      Call MD for:  difficulty breathing, headache or visual disturbances      Call MD for:  persistant dizziness or light-headedness      Discharge instructions      Comments:   Please use cane to help with ambulation and if you start experiencing any pelvic pain please call PCP and let them know as this may be a sign of a pelvic fracture.       Medication List     As of 05/15/2012 11:52 AM    TAKE these medications         anastrozole 1 MG tablet   Commonly known as: ARIMIDEX   Take 1 tablet (1 mg total) by mouth daily.      diphenhydrAMINE 25 MG tablet   Commonly known as: BENADRYL   Take 1 tablet (25 mg total) by mouth every 6 (six) hours as needed for itching, allergies or sleep. For anxiety/sleep.      DSS 100 MG Caps   Take 100 mg by mouth 2 (two) times daily.      fluconazole 100 MG tablet   Commonly known as: DIFLUCAN   Take 100 mg by mouth daily.      HYDROmorphone 2 MG tablet   Commonly known as: DILAUDID   Take 2 tablets (4 mg total) by mouth every 4 (four) hours as needed for pain. For pain      levothyroxine 150 MCG tablet   Commonly known as: SYNTHROID, LEVOTHROID   Take 150 mcg by mouth daily.      LORazepam 0.5 MG tablet   Commonly known as: ATIVAN   Take 1 tablet (0.5 mg total) by mouth daily.  For anxiety      megestrol 40 MG/ML suspension   Commonly known as: MEGACE   Take 5 mLs (200 mg total) by mouth daily.      metoCLOPramide 10 MG tablet   Commonly known as: REGLAN   Take 10 mg by mouth 4 (four) times daily.      morphine 15 MG 12 hr tablet   Commonly known as: MS CONTIN   Take 1 tablet (15 mg total) by mouth every 12 (twelve) hours.      omeprazole 20 MG capsule   Commonly known as: PRILOSEC   Take 20 mg by mouth daily.      ondansetron 4 MG disintegrating tablet   Commonly known as: ZOFRAN-ODT   Take  4 mg by mouth every 8 (eight) hours as needed. For nausea.      ondansetron 4 MG tablet   Commonly known as: ZOFRAN   Take 1 tablet (4 mg total) by mouth every 6 (six) hours as needed for nausea.      oxyCODONE-acetaminophen 5-325 MG per tablet   Commonly known as: PERCOCET/ROXICET   Take 1-2 tablets by mouth every 8 (eight) hours as needed for pain. For pain.      PRESCRIPTION MEDICATION   Inject into the vein every 28 (twenty-eight) days. Zometa once monthly.      promethazine 25 MG tablet   Commonly known as: PHENERGAN   Take 25 mg by mouth every 6 (six) hours as needed. Nausea      traMADol 50 MG tablet   Commonly known as: ULTRAM   Take 1 tablet (50 mg total) by mouth every 6 (six) hours as needed. For pain      venlafaxine XR 75 MG 24 hr capsule   Commonly known as: EFFEXOR-XR   Take 2 capsules (150 mg total) by mouth daily.           Follow-up Information    In 2 weeks to follow up. (If symptoms worsen)           The results of significant diagnostics from this hospitalization (including imaging, microbiology, ancillary and laboratory) are listed below for reference.    Significant Diagnostic Studies: Mr Abdomen W Wo Contrast  04/25/2012  *RADIOLOGY REPORT*  Clinical Data: Pancreatitis, abdominal pain, history of metastatic breast cancer  MRI ABDOMEN WITH AND WITHOUT CONTRAST  Technique:  Multiplanar multisequence MR imaging of the  abdomen was performed both before and after administration of intravenous contrast.  Contrast: 14mL MULTIHANCE GADOBENATE DIMEGLUMINE 529 MG/ML IV SOLN  Comparison: CT abdomen pelvis dated 04/23/2012  Findings: Severely motion degraded images.  3.3 x 2.0 cm enhancing metastasis in the right hepatic lobe, just posterior to the IVC (series 3/image 18), unchanged from recent CT. No hepatic steatosis.  Peripancreatic inflammatory changes/edema, most prominently adjacent to the pancreatic body/tail, reflecting known acute pancreatitis.  No associated pancreatic ductal dilatation.  Spleen and adrenal glands are within normal limits.  Gallbladder is unremarkable.  No intrahepatic or extrahepatic ductal dilatation.  No cholelithiasis or choledocholithiasis is seen.  Kidneys are unremarkable.  No hydronephrosis.  Known prominent upper abdominal/retroperitoneal lymph nodes are better demonstrated on recent CT.  Multifocal osseous metastases.  IMPRESSION: Severely motion degraded images.  Peripancreatic inflammatory changes/edema, reflecting known acute pancreatitis.  No underlying etiology is evident on the current study.  Hepatic and osseous metastases.   Original Report Authenticated By: Charline Bills, M.D.    Ct Abdomen Pelvis W Contrast  04/23/2012  *RADIOLOGY REPORT*  Clinical Data: Abdominal pain with vomiting.  Breast cancer.  CT ABDOMEN AND PELVIS WITH CONTRAST  Technique:  Multidetector CT imaging of the abdomen and pelvis was performed following the standard protocol during bolus administration of intravenous contrast.  Contrast: OMNIPAQUE IOHEXOL 300 MG/ML  SOLN  Comparison: Abdominal pelvic CT 04/07/2012.  Findings: Small bilateral pleural effusions have enlarged.  The lung bases are clear.  A metastasis centrally in the right hepatic lobe adjacent to the IVC is best seen on the delayed images and has slightly enlarged, measuring 3.5 cm in diameter.  The liver otherwise appears normal. The gallbladder  and biliary system appear normal.  There is new ill- defined enlargement of the pancreatic tail with mild surrounding soft tissue swelling and  ill-defined fluid.  In this patient with a mildly elevated lipase level, these findings are supportive of acute pancreatitis.  The splenic and portal veins are patent.  The spleen and adrenal glands appear normal.  There is no hydronephrosis.  Scattered prominent retroperitoneal and mesenteric lymph nodes are stable.  There is also stable mild peritoneal nodularity.  The patient has developed a small amount of pelvic ascites.  Central low density anteriorly in the uterus likely represents a fibroid. There is no discrete adnexal mass.  Widespread osseous metastatic disease is again demonstrated with pathologic rib fractures and several Schmorl's nodes.  There is a pathologic fracture through the right L4 lamina and the right L5 pars interarticularis.   No significant epidural tumor is identified. There is a sclerotic metastasis involving the left femoral head and neck.  There are lytic metastases involving the right acetabulum.  IMPRESSION:  1.  New enlargement of the pancreatic body and tail with surrounding edema consistent with acute pancreatitis. 2.  Increased pelvic ascites. 3.  Widespread osseous metastatic disease with scattered pathologic fractures as described.  No significant epidural tumor identified. 4.  Hepatic metastasis appears slightly larger.   Original Report Authenticated By: Gerrianne Scale, M.D.    Dg Abd Acute W/chest  05/12/2012  *RADIOLOGY REPORT*  Clinical Data: Abdominal pain, breast carcinoma  ACUTE ABDOMEN SERIES (ABDOMEN 2 VIEW & CHEST 1 VIEW)  Comparison: 04/23/2012 and earlier studies  Findings: Asymmetric pleuroparenchymal thickening at the left lung apex.  Coarse perihilar interstitial opacities.  Linear scarring/atelectasis at the left lung base as before.  Vascular clips in the left breast.  Heart size normal.  No effusion.  No free air.   Small bowel decompressed.  Normal distribution of gas and stool throughout colon.  Left pelvic phleboliths.  Sclerotic osseous lesions in the lower lumbar spine, pelvis, and left femoral neck are again noted.  There is a right pubic fracture which was not clearly evident on the prior scan.  IMPRESSION:  1.  New pathologic fracture of the right pubic bone. 2.  Normal bowel gas pattern. 3.  No free air. 4.  Persistent   left apical pleuroparenchymal disease with worsening perihilar interstitial opacities.   Original Report Authenticated By: Osa Craver, M.D.     Microbiology: Recent Results (from the past 240 hour(s))  URINE CULTURE     Status: Normal   Collection Time   05/13/12 12:40 AM      Component Value Range Status Comment   Specimen Description URINE, CLEAN CATCH   Final    Special Requests NONE   Final    Culture  Setup Time 05/13/2012 05:08   Final    Colony Count 20,OOO COLONIES/ML   Final    Culture     Final    Value: Multiple bacterial morphotypes present, none predominant. Suggest appropriate recollection if clinically indicated.   Report Status 05/14/2012 FINAL   Final      Labs: Basic Metabolic Panel:  Lab 05/13/12 1610 05/12/12 1826 05/11/12 2024 05/11/12 2015 05/09/12 1402  NA 132* 131* 139 135 136  K 4.1 3.8 3.9 3.8 4.5  CL 101 98 105 100 102  CO2 21 22 -- 26 26  GLUCOSE 80 92 118* 120* 89  BUN 8 8 13 12  18.0  CREATININE 0.76 0.62 1.00 0.77 0.9  CALCIUM 9.1 9.5 -- 10.3 11.1*  MG -- -- -- -- --  PHOS -- -- -- -- --   Liver Function Tests:  Lab 05/12/12 1826 05/11/12 2015 05/09/12 1402  AST 21 17 15   ALT 11 9 9   ALKPHOS 70 75 73  BILITOT 0.3 0.2* 0.30  PROT 7.5 7.8 6.9  ALBUMIN 3.0* 3.3* 2.9*    Lab 05/12/12 1826 05/11/12 2015  LIPASE 38 94*  AMYLASE -- --   No results found for this basename: AMMONIA:5 in the last 168 hours CBC:  Lab 05/13/12 0918 05/12/12 1826 05/11/12 2024 05/11/12 2015 05/09/12 1402  WBC 5.8 5.0 -- 6.3 5.2  NEUTROABS  -- 4.1 -- 5.2 3.8  HGB 8.6* 10.1* 11.9* 9.9* 8.9*  HCT 24.4* 30.3* 35.0* 30.3* 27.3*  MCV 73.1* 75.4* -- 75.2* 76.3*  PLT 363 359 -- 415* 337   Cardiac Enzymes: No results found for this basename: CKTOTAL:5,CKMB:5,CKMBINDEX:5,TROPONINI:5 in the last 168 hours BNP: BNP (last 3 results) No results found for this basename: PROBNP:3 in the last 8760 hours CBG: No results found for this basename: GLUCAP:5 in the last 168 hours  Time coordinating discharge: Over 30 minutes  Signed:  Manson Passey, MD  [facility] 05/15/2012, 11:52 AM  Pager #: [phone]

## 2012-05-16 ENCOUNTER — Ambulatory Visit: Payer: Self-pay

## 2012-05-17 ENCOUNTER — Ambulatory Visit: Payer: Self-pay

## 2012-05-20 ENCOUNTER — Other Ambulatory Visit: Payer: Self-pay | Admitting: Oncology

## 2012-05-22 ENCOUNTER — Other Ambulatory Visit: Payer: Self-pay | Admitting: *Deleted

## 2012-05-22 DIAGNOSIS — C50919 Malignant neoplasm of unspecified site of unspecified female breast: Secondary | ICD-10-CM

## 2012-05-22 MED ORDER — OXYCODONE-ACETAMINOPHEN 5-325 MG PO TABS: 1.0000 | ORAL_TABLET | Freq: Three times a day (TID) | ORAL | Status: DC | PRN

## 2012-05-22 MED ORDER — HYDROMORPHONE HCL 2 MG PO TABS: 4.0000 mg | ORAL_TABLET | ORAL | Status: DC | PRN

## 2012-05-22 MED ORDER — ONDANSETRON HCL 4 MG PO TABS: 4.0000 mg | ORAL_TABLET | Freq: Four times a day (QID) | ORAL | Status: DC | PRN

## 2012-05-23 ENCOUNTER — Ambulatory Visit: Payer: Self-pay

## 2012-05-23 ENCOUNTER — Inpatient Hospital Stay (HOSPITAL_COMMUNITY)
Admission: EM | Admit: 2012-05-23 | Discharge: 2012-05-27 | DRG: 542 | Disposition: A | Discharge status: Hospice | Payer: Medicare Other | Attending: Family Medicine | Admitting: Family Medicine

## 2012-05-23 ENCOUNTER — Telehealth: Payer: Self-pay | Admitting: Internal Medicine

## 2012-05-23 ENCOUNTER — Emergency Department (HOSPITAL_COMMUNITY): Payer: Medicare Other

## 2012-05-23 ENCOUNTER — Encounter (HOSPITAL_COMMUNITY): Payer: Self-pay | Admitting: *Deleted

## 2012-05-23 DIAGNOSIS — F3289 Other specified depressive episodes: Secondary | ICD-10-CM | POA: Diagnosis present

## 2012-05-23 DIAGNOSIS — K859 Acute pancreatitis without necrosis or infection, unspecified: Secondary | ICD-10-CM | POA: Diagnosis present

## 2012-05-23 DIAGNOSIS — F419 Anxiety disorder, unspecified: Secondary | ICD-10-CM

## 2012-05-23 DIAGNOSIS — C7800 Secondary malignant neoplasm of unspecified lung: Secondary | ICD-10-CM | POA: Diagnosis present

## 2012-05-23 DIAGNOSIS — C7951 Secondary malignant neoplasm of bone: Principal | ICD-10-CM | POA: Diagnosis present

## 2012-05-23 DIAGNOSIS — M25519 Pain in unspecified shoulder: Secondary | ICD-10-CM | POA: Diagnosis present

## 2012-05-23 DIAGNOSIS — E039 Hypothyroidism, unspecified: Secondary | ICD-10-CM | POA: Diagnosis present

## 2012-05-23 DIAGNOSIS — G893 Neoplasm related pain (acute) (chronic): Secondary | ICD-10-CM | POA: Diagnosis present

## 2012-05-23 DIAGNOSIS — C787 Secondary malignant neoplasm of liver and intrahepatic bile duct: Secondary | ICD-10-CM | POA: Diagnosis present

## 2012-05-23 DIAGNOSIS — C779 Secondary and unspecified malignant neoplasm of lymph node, unspecified: Secondary | ICD-10-CM | POA: Diagnosis present

## 2012-05-23 DIAGNOSIS — E871 Hypo-osmolality and hyponatremia: Secondary | ICD-10-CM | POA: Diagnosis present

## 2012-05-23 DIAGNOSIS — R52 Pain, unspecified: Secondary | ICD-10-CM | POA: Diagnosis present

## 2012-05-23 DIAGNOSIS — M25511 Pain in right shoulder: Secondary | ICD-10-CM

## 2012-05-23 DIAGNOSIS — K5900 Constipation, unspecified: Secondary | ICD-10-CM | POA: Diagnosis present

## 2012-05-23 DIAGNOSIS — J189 Pneumonia, unspecified organism: Secondary | ICD-10-CM | POA: Diagnosis present

## 2012-05-23 DIAGNOSIS — M25551 Pain in right hip: Secondary | ICD-10-CM

## 2012-05-23 DIAGNOSIS — C7952 Secondary malignant neoplasm of bone marrow: Principal | ICD-10-CM | POA: Diagnosis present

## 2012-05-23 DIAGNOSIS — S329XXA Fracture of unspecified parts of lumbosacral spine and pelvis, initial encounter for closed fracture: Secondary | ICD-10-CM | POA: Diagnosis present

## 2012-05-23 DIAGNOSIS — F329 Major depressive disorder, single episode, unspecified: Secondary | ICD-10-CM | POA: Diagnosis present

## 2012-05-23 DIAGNOSIS — R110 Nausea: Secondary | ICD-10-CM

## 2012-05-23 DIAGNOSIS — C50919 Malignant neoplasm of unspecified site of unspecified female breast: Secondary | ICD-10-CM | POA: Diagnosis present

## 2012-05-23 DIAGNOSIS — C800 Disseminated malignant neoplasm, unspecified: Secondary | ICD-10-CM

## 2012-05-23 DIAGNOSIS — M25559 Pain in unspecified hip: Secondary | ICD-10-CM | POA: Diagnosis present

## 2012-05-23 DIAGNOSIS — R630 Anorexia: Secondary | ICD-10-CM

## 2012-05-23 DIAGNOSIS — M8448XA Pathological fracture, other site, initial encounter for fracture: Secondary | ICD-10-CM | POA: Diagnosis present

## 2012-05-23 LAB — URINALYSIS, ROUTINE W REFLEX MICROSCOPIC
Bilirubin Urine: NEGATIVE
Hgb urine dipstick: NEGATIVE
Nitrite: NEGATIVE
Specific Gravity, Urine: 1.012 (ref 1.005–1.030)
pH: 7.5 (ref 5.0–8.0)

## 2012-05-23 LAB — PREGNANCY, URINE: Preg Test, Ur: NEGATIVE

## 2012-05-23 LAB — COMPREHENSIVE METABOLIC PANEL
ALT: 6 U/L (ref 0–35)
Albumin: 3 g/dL — ABNORMAL LOW (ref 3.5–5.2)
Alkaline Phosphatase: 83 U/L (ref 39–117)
Potassium: 3.7 mEq/L (ref 3.5–5.1)
Sodium: 137 mEq/L (ref 135–145)
Total Protein: 7.5 g/dL (ref 6.0–8.3)

## 2012-05-23 LAB — CBC WITH DIFFERENTIAL/PLATELET
Basophils Absolute: 0 10*3/uL (ref 0.0–0.1)
Basophils Relative: 0 % (ref 0–1)
Eosinophils Absolute: 0 10*3/uL (ref 0.0–0.7)
MCH: 23.8 pg — ABNORMAL LOW (ref 26.0–34.0)
MCHC: 32.2 g/dL (ref 30.0–36.0)
Neutrophils Relative %: 90 % — ABNORMAL HIGH (ref 43–77)
Platelets: 428 10*3/uL — ABNORMAL HIGH (ref 150–400)
RBC: 3.87 MIL/uL (ref 3.87–5.11)
RDW: 18.2 % — ABNORMAL HIGH (ref 11.5–15.5)

## 2012-05-23 MED ORDER — HYDROMORPHONE HCL PF 1 MG/ML IJ SOLN
1.0000 mg | Freq: Once | INTRAMUSCULAR | Status: AC
  Administered 2012-05-23: 1 mg via INTRAVENOUS
  Filled 2012-05-23: qty 1

## 2012-05-23 MED ORDER — FENTANYL CITRATE 0.05 MG/ML IJ SOLN
100.0000 ug | Freq: Once | INTRAMUSCULAR | Status: AC
  Administered 2012-05-23: 100 ug via INTRAVENOUS
  Filled 2012-05-23: qty 2

## 2012-05-23 MED ORDER — ONDANSETRON HCL 4 MG/2ML IJ SOLN
4.0000 mg | Freq: Once | INTRAMUSCULAR | Status: AC
  Administered 2012-05-23: 4 mg via INTRAVENOUS
  Filled 2012-05-23: qty 2

## 2012-05-23 MED ORDER — ACETAMINOPHEN 325 MG PO TABS
650.0000 mg | ORAL_TABLET | Freq: Once | ORAL | Status: DC
Start: 2012-05-23 — End: 2012-05-27
  Filled 2012-05-23: qty 2

## 2012-05-23 MED ORDER — SODIUM CHLORIDE 0.9 % IV SOLN: Freq: Once | INTRAVENOUS | Status: DC

## 2012-05-23 MED ORDER — PIPERACILLIN-TAZOBACTAM 3.375 G IVPB
3.3750 g | Freq: Once | INTRAVENOUS | Status: AC
  Administered 2012-05-23: 3.375 g via INTRAVENOUS
  Filled 2012-05-23: qty 50

## 2012-05-23 MED ORDER — VANCOMYCIN HCL IN DEXTROSE 1-5 GM/200ML-% IV SOLN
1000.0000 mg | Freq: Once | INTRAVENOUS | Status: AC
  Administered 2012-05-24: 1000 mg via INTRAVENOUS
  Filled 2012-05-23: qty 200

## 2012-05-23 NOTE — Plan of Care (Signed)
Notified by ED that patient presented with difficulty ambulating secondary to pain. Imaging with concern for acute Fx of Rt. Inominate bone.  Patient to be admitted to hospitalist service.  Agree with Palliative Care consult for assistance with pain mgmt.

## 2012-05-23 NOTE — ED Provider Notes (Signed)
History     CSN: [id number]  Arrival date & time 05/23/12  1738   First MD Initiated Contact with Patient 05/23/12 2002      Chief Complaint  Patient presents with  . Fever  . Nausea  . Emesis    (Consider location/radiation/quality/duration/timing/severity/associated sxs/prior treatment) HPI History provided by pt.   Pt has breast cancer that has metastasized to liver and bone.  She has had diffuse, severe body aches as well as her typical migraine headache all day today.  Associated w/ 2 episodes of vomiting.  Denies fever, cough, SOB, abd pain, diarrhea, urinary sx.   Is not currently receiving radiation or chemotherapy.  Her son has a viral URI but otherwise no sick contacts.  Per prior chart, pt admitted 10/4-10/7/13 for uncontrolled abd pain and N/V w/ acute pancreatitis and new pathologic pelvic fractures.    Past Medical History  Diagnosis Date  . Breast lump   . Thyroid disease   . Hypertension   . Hx of radiation therapy 06/2010, 10/2009, 11/2011    L breast, T3-T12 spine, L-S spine  . 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 Breast cancer 09/2009    L breast, ER+, PR-, Her 2 -    Past Surgical History  Procedure Date  . Knee arthroscopy     left  . Breast surgery     left lumpectomy  . Knee cartilage surgery 2006    No family history on file.  History  Substance Use Topics  . Smoking status: Former Smoker    Quit date: 08/25/2007  . Smokeless tobacco: Not on file  . Alcohol Use: No    OB History    Grav Para Term Preterm Abortions TAB SAB Ect Mult Living                  Review of Systems  All other systems reviewed and are negative.    Allergies  Review of patient's allergies indicates no known allergies.  Home Medications   Current Outpatient Rx  Name Route Sig Dispense Refill  . ANASTROZOLE 1 MG PO TABS Oral Take 1 tablet (1 mg total) by mouth daily. 30 tablet 5  . DIPHENHYDRAMINE HCL 25 MG PO TABS Oral Take 1 tablet (25 mg total) by mouth  every 6 (six) hours as needed for itching, allergies or sleep. For anxiety/sleep. 60 tablet 0  . DSS 100 MG PO CAPS Oral Take 100 mg by mouth 2 (two) times daily. 45 capsule 0  . FLUCONAZOLE 100 MG PO TABS Oral Take 100 mg by mouth daily.    Marland Kitchen HYDROMORPHONE HCL 2 MG PO TABS Oral Take 2 tablets (4 mg total) by mouth every 4 (four) hours as needed for pain. For pain 84 tablet 0  . LEVOTHYROXINE SODIUM 150 MCG PO TABS Oral Take 150 mcg by mouth daily.    Marland Kitchen LORAZEPAM 0.5 MG PO TABS Oral Take 1 tablet (0.5 mg total) by mouth daily. For anxiety 60 tablet 0  . MEGESTROL ACETATE 40 MG/ML PO SUSP Oral Take 5 mLs (200 mg total) by mouth daily. 240 mL 2  . METOCLOPRAMIDE HCL 10 MG PO TABS Oral Take 10 mg by mouth 4 (four) times daily.     . MORPHINE SULFATE ER 15 MG PO TBCR Oral Take 1 tablet (15 mg total) by mouth every 12 (twelve) hours. 60 tablet 0  . OMEPRAZOLE 20 MG PO CPDR Oral Take 20 mg by mouth daily.     Marland Kitchen  ONDANSETRON HCL 4 MG PO TABS Oral Take 1 tablet (4 mg total) by mouth every 6 (six) hours as needed for nausea. 45 tablet 0  . OXYCODONE-ACETAMINOPHEN 5-325 MG PO TABS Oral Take 1-2 tablets by mouth every 8 (eight) hours as needed for pain. For pain. 45 tablet 0  . PRESCRIPTION MEDICATION Intravenous Inject into the vein every 28 (twenty-eight) days. Zometa once monthly.    Marland Kitchen PROMETHAZINE HCL 25 MG PO TABS Oral Take 25 mg by mouth every 6 (six) hours as needed. Nausea    . TRAMADOL HCL 50 MG PO TABS Oral Take 1 tablet (50 mg total) by mouth every 6 (six) hours as needed. For pain 120 tablet 0  . VENLAFAXINE HCL ER 75 MG PO CP24 Oral Take 2 capsules (150 mg total) by mouth daily. 30 capsule 12    BP 180/91  Pulse 89  Temp 100.6 F (38.1 C) (Rectal)  Resp 20  SpO2 95%  Physical Exam  Nursing note and vitals reviewed. Constitutional: She is oriented to person, place, and time. She appears well-developed and well-nourished. No distress.       Uncomfortable appearing; moaning  HENT:  Head:  Normocephalic and atraumatic.  Eyes:       Normal appearance  Neck: Normal range of motion.       No meningeal signs  Cardiovascular: Normal rate and regular rhythm.   Pulmonary/Chest: Effort normal and breath sounds normal. No respiratory distress.       Diffuse congestion and expiratory wheezing.  No cough.  O2 89-92% room air.   Abdominal: Soft. Bowel sounds are normal. She exhibits no distension.       Diffuse ttp  Musculoskeletal: Normal range of motion.       Symmetric non-pitting edema bilateral feet and ankles.  Non-tender.  Neurological: She is alert and oriented to person, place, and time.       CN 3-12 intact.  No sensory deficits.  5/5 and equal upper and lower extremity strength.  No past pointing.   Skin: Skin is warm and dry. No rash noted.  Psychiatric: She has a normal mood and affect. Her behavior is normal.    ED Course  Procedures (including critical care time)  Labs Reviewed  CBC WITH DIFFERENTIAL - Abnormal; Notable for the following:    Hemoglobin 9.2 (*)     HCT 28.6 (*)     MCV 73.9 (*)     MCH 23.8 (*)     RDW 18.2 (*)     Platelets 428 (*)     Neutrophils Relative 90 (*)     Lymphocytes Relative 6 (*)     Lymphs Abs 0.4 (*)     All other components within normal limits  COMPREHENSIVE METABOLIC PANEL - Abnormal; Notable for the following:    Glucose, Bld 114 (*)     Albumin 3.0 (*)     All other components within normal limits  URINALYSIS, ROUTINE W REFLEX MICROSCOPIC - Abnormal; Notable for the following:    APPearance CLOUDY (*)     Ketones, ur 15 (*)     All other components within normal limits  LIPASE, BLOOD  PREGNANCY, URINE  INFLUENZA PANEL BY PCR  CULTURE, EXPECTORATED SPUTUM-ASSESSMENT  BASIC METABOLIC PANEL  CBC   Dg Chest 2 View  05/23/2012  *RADIOLOGY REPORT*  Clinical Data: Altered mental status.  History of metastatic breast cancer.  CHEST - 2 VIEW  Comparison: Chest x-ray 10 of 11/2011.  Findings: The  cardiac silhouette,  mediastinal and hilar contours are stable.  Severe chronic lung changes are noted. Possible superimposed right upper lobe infiltrate.  No obvious destructive bone lesions.  IMPRESSION:  1.  Severe chronic lung changes. 2.  Possible superimposed right upper lobe infiltrate.   Original Report Authenticated By: P. Loralie Champagne, M.D.    Dg Pelvis Portable  05/23/2012  *RADIOLOGY REPORT*  Clinical Data: Bilateral hip pain.  No injury.  PORTABLE PELVIS  Comparison: Abdomen 05/12/2012  Findings: Heterogeneous areas of lucency and sclerosis in the right superior acetabular region and in the symphysis pubis bilaterally. Stable appearance of pathologic fractures of the right symphysis pubis region since the previous abdominal film.  There is new cortical lucency extending along the right innominate bone to the base of the superior right pubic ramus suggesting new pathologic fracture.  Heterogeneous sclerosis in the left femoral head consistent with metastasis.  SI joints and symphysis pubis are not displaced.  IMPRESSION: Changes consistent with known bony metastasis.  Apparent acute nondisplaced pathologic fracture of the right innominate bone. Stable appearance of previously demonstrated pathologic fractures in the symphysis pubis.   Original Report Authenticated By: Marlon Pel, M.D.      1. HCAP (healthcare-associated pneumonia)   2. 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 Anorexia   4. Nausea   5. Anxiety   6. Pelvic fracture   7. Uncontrolled pain       MDM  45yo F w/ metastatic breast cancer presents w/ new onset severe, diffuse body aches, abdominal pain and N/V as well as typical migraine headache this morning.  On exam, pt has a temp of 100.8 rectally, uncomfortable appearing, mildly hypoxic at 89-92% on rm air, lungs congested, abd diffusely tender, no focal neuro deficits or meningeal signs.  U/A neg for infection and CXR shows chronic lung changes w/ possible superimposed  right upper lobe infiltrate.  Labs otherwise unremarkable.  I am suspicious that patient has HCAP (recent admission for pancreatitis/abdominal pain control).  Vanc and Zosyn ordered.  She has received IV dilaudid and zofran w/ some relief of sx.  [facility] consulted for admission.  9:12 PM       Otilio Miu, [place] 05/24/12 [phone]

## 2012-05-23 NOTE — ED Provider Notes (Signed)
Complains of productive cough onset today with diffuse body aches and fever. Patient suffers from chronic pain from metastatic cancer patient alert appears uncomfortable speaks in paragraphs. Chest x-ray reviewed by me plan admission pain control IV antibiotics treat for HCAP in light of immunocompromise state  Doug Sou, MD 05/23/12 2223

## 2012-05-23 NOTE — ED Notes (Signed)
Pt from home, has cancer, mets to the liver. Pt reports nausea/vomiting since 0800. Pt reports generalized pain. States that she is out of her Hydromorphone. Pt received zofran 4mg  iv en route to ER. Pt has a 22g oiv in right hand. Pt alert and oriented x 4. Reports pain 8/10 at this time. Mom at the bedside.

## 2012-05-23 NOTE — Telephone Encounter (Signed)
Entered in error

## 2012-05-23 NOTE — ED Notes (Signed)
ZOX:[id number]<AV> Expected date:05/23/12<BR> Expected time: 5:30 PM<BR> Means of arrival:Ambulance<BR> Comments:<BR> Ca pt, fever/achy

## 2012-05-23 NOTE — ED Notes (Signed)
Pt refusing blood draw for labs. RN Eden Emms made aware

## 2012-05-23 NOTE — ED Notes (Signed)
Pt had yellow emesis x 1 bag in room. Pt warm and diaphoretic with blankets on her. Attempted to remove blanket, but pt kept crying that she was cold. Pt placed on monitor due to pulse in 120s but pt was vomiting at the time. Pt placed on bedpan to urinate.

## 2012-05-24 ENCOUNTER — Inpatient Hospital Stay (HOSPITAL_COMMUNITY): Payer: Medicare Other

## 2012-05-24 DIAGNOSIS — M7989 Other specified soft tissue disorders: Secondary | ICD-10-CM

## 2012-05-24 DIAGNOSIS — G893 Neoplasm related pain (acute) (chronic): Secondary | ICD-10-CM

## 2012-05-24 DIAGNOSIS — C7951 Secondary malignant neoplasm of bone: Principal | ICD-10-CM

## 2012-05-24 DIAGNOSIS — J189 Pneumonia, unspecified organism: Secondary | ICD-10-CM

## 2012-05-24 DIAGNOSIS — M25519 Pain in unspecified shoulder: Secondary | ICD-10-CM

## 2012-05-24 DIAGNOSIS — S329XXA Fracture of unspecified parts of lumbosacral spine and pelvis, initial encounter for closed fracture: Secondary | ICD-10-CM | POA: Diagnosis present

## 2012-05-24 DIAGNOSIS — M25511 Pain in right shoulder: Secondary | ICD-10-CM

## 2012-05-24 DIAGNOSIS — M25559 Pain in unspecified hip: Secondary | ICD-10-CM

## 2012-05-24 DIAGNOSIS — R110 Nausea: Secondary | ICD-10-CM

## 2012-05-24 DIAGNOSIS — C50919 Malignant neoplasm of unspecified site of unspecified female breast: Secondary | ICD-10-CM

## 2012-05-24 DIAGNOSIS — M25551 Pain in right hip: Secondary | ICD-10-CM

## 2012-05-24 DIAGNOSIS — D649 Anemia, unspecified: Secondary | ICD-10-CM

## 2012-05-24 DIAGNOSIS — F411 Generalized anxiety disorder: Secondary | ICD-10-CM

## 2012-05-24 LAB — BASIC METABOLIC PANEL
Chloride: 98 mEq/L (ref 96–112)
Creatinine, Ser: 0.75 mg/dL (ref 0.50–1.10)
GFR calc Af Amer: >90 mL/min (ref >90)
Sodium: 134 mEq/L — ABNORMAL LOW (ref 135–145)

## 2012-05-24 LAB — CBC
HCT: 26.3 % — ABNORMAL LOW (ref 36.0–46.0)
Platelets: 364 10*3/uL (ref 150–400)
RBC: 3.57 MIL/uL — ABNORMAL LOW (ref 3.87–5.11)
RDW: 18.1 % — ABNORMAL HIGH (ref 11.5–15.5)
WBC: 6.5 10*3/uL (ref 4.0–10.5)

## 2012-05-24 LAB — INFLUENZA PANEL BY PCR (TYPE A & B): Influenza A By PCR: NEGATIVE

## 2012-05-24 MED ORDER — IOHEXOL 300 MG/ML  SOLN
80.0000 mL | Freq: Once | INTRAMUSCULAR | Status: AC | PRN
  Administered 2012-05-24: 80 mL via INTRAVENOUS

## 2012-05-24 MED ORDER — HEPARIN SODIUM (PORCINE) 5000 UNIT/ML IJ SOLN
5000.0000 [IU] | Freq: Three times a day (TID) | INTRAMUSCULAR | Status: DC
  Administered 2012-05-24 – 2012-05-27 (×10): 5000 [IU] via SUBCUTANEOUS
  Filled 2012-05-24 (×13): qty 1

## 2012-05-24 MED ORDER — FLUCONAZOLE 100 MG PO TABS
100.0000 mg | ORAL_TABLET | Freq: Every day | ORAL | Status: DC
  Administered 2012-05-24 – 2012-05-25 (×2): 100 mg via ORAL
  Filled 2012-05-24 (×2): qty 1

## 2012-05-24 MED ORDER — DOCUSATE SODIUM 100 MG PO CAPS
100.0000 mg | ORAL_CAPSULE | Freq: Two times a day (BID) | ORAL | Status: DC
  Administered 2012-05-24 – 2012-05-26 (×6): 100 mg via ORAL
  Filled 2012-05-24 (×8): qty 1

## 2012-05-24 MED ORDER — VITAMINS A & D EX OINT
TOPICAL_OINTMENT | CUTANEOUS | Status: AC
  Administered 2012-05-24: 22:00:00
  Filled 2012-05-24: qty 5

## 2012-05-24 MED ORDER — SODIUM CHLORIDE 0.9 % IV SOLN
INTRAVENOUS | Status: DC
  Administered 2012-05-24: 02:00:00 via INTRAVENOUS
  Administered 2012-05-24: 100 mL/h via INTRAVENOUS
  Administered 2012-05-26 – 2012-05-27 (×3): via INTRAVENOUS

## 2012-05-24 MED ORDER — VANCOMYCIN HCL IN DEXTROSE 1-5 GM/200ML-% IV SOLN
1000.0000 mg | Freq: Three times a day (TID) | INTRAVENOUS | Status: DC
  Administered 2012-05-24 – 2012-05-25 (×4): 1000 mg via INTRAVENOUS
  Filled 2012-05-24 (×4): qty 200

## 2012-05-24 MED ORDER — DIPHENHYDRAMINE HCL 25 MG PO TABS
25.0000 mg | ORAL_TABLET | Freq: Four times a day (QID) | ORAL | Status: DC | PRN
  Administered 2012-05-24: 25 mg via ORAL
  Filled 2012-05-24 (×2): qty 1

## 2012-05-24 MED ORDER — VENLAFAXINE HCL ER 150 MG PO CP24
150.0000 mg | ORAL_CAPSULE | Freq: Every day | ORAL | Status: DC
  Administered 2012-05-24 – 2012-05-27 (×4): 150 mg via ORAL
  Filled 2012-05-24 (×4): qty 1

## 2012-05-24 MED ORDER — SODIUM CHLORIDE 0.9 % IJ SOLN
3.0000 mL | Freq: Two times a day (BID) | INTRAMUSCULAR | Status: DC
  Administered 2012-05-24 – 2012-05-26 (×3): 3 mL via INTRAVENOUS

## 2012-05-24 MED ORDER — LEVOTHYROXINE SODIUM 150 MCG PO TABS
150.0000 ug | ORAL_TABLET | Freq: Every day | ORAL | Status: DC
  Administered 2012-05-24 – 2012-05-27 (×4): 150 ug via ORAL
  Filled 2012-05-24 (×5): qty 1

## 2012-05-24 MED ORDER — ONDANSETRON HCL 4 MG PO TABS
4.0000 mg | ORAL_TABLET | Freq: Four times a day (QID) | ORAL | Status: DC | PRN
  Administered 2012-05-24 (×2): 4 mg via ORAL
  Filled 2012-05-24 (×2): qty 1

## 2012-05-24 MED ORDER — LORAZEPAM 0.5 MG PO TABS
0.5000 mg | ORAL_TABLET | Freq: Every day | ORAL | Status: DC
  Administered 2012-05-24 – 2012-05-27 (×4): 0.5 mg via ORAL
  Filled 2012-05-24 (×4): qty 1

## 2012-05-24 MED ORDER — HYDROMORPHONE HCL PF 1 MG/ML IJ SOLN
0.5000 mg | INTRAMUSCULAR | Status: DC | PRN
  Administered 2012-05-24 – 2012-05-26 (×17): 1 mg via INTRAVENOUS
  Filled 2012-05-24 (×17): qty 1

## 2012-05-24 MED ORDER — MORPHINE SULFATE ER 15 MG PO TBCR
15.0000 mg | EXTENDED_RELEASE_TABLET | Freq: Two times a day (BID) | ORAL | Status: DC
  Administered 2012-05-24 – 2012-05-26 (×6): 15 mg via ORAL
  Filled 2012-05-24 (×6): qty 1

## 2012-05-24 MED ORDER — MEGESTROL ACETATE 40 MG/ML PO SUSP
200.0000 mg | Freq: Every day | ORAL | Status: DC
  Administered 2012-05-24 – 2012-05-27 (×4): 200 mg via ORAL
  Filled 2012-05-24 (×4): qty 5

## 2012-05-24 MED ORDER — PIPERACILLIN-TAZOBACTAM 3.375 G IVPB
3.3750 g | Freq: Three times a day (TID) | INTRAVENOUS | Status: DC
  Administered 2012-05-24 – 2012-05-25 (×5): 3.375 g via INTRAVENOUS
  Filled 2012-05-24 (×6): qty 50

## 2012-05-24 MED ORDER — PANTOPRAZOLE SODIUM 40 MG PO TBEC
40.0000 mg | DELAYED_RELEASE_TABLET | Freq: Every day | ORAL | Status: DC
  Administered 2012-05-24 – 2012-05-27 (×4): 40 mg via ORAL
  Filled 2012-05-24 (×4): qty 1

## 2012-05-24 MED ORDER — ALBUTEROL SULFATE (5 MG/ML) 0.5% IN NEBU
2.5000 mg | INHALATION_SOLUTION | RESPIRATORY_TRACT | Status: DC | PRN
  Administered 2012-05-26 – 2012-05-27 (×3): 2.5 mg via RESPIRATORY_TRACT
  Filled 2012-05-24 (×3): qty 0.5

## 2012-05-24 MED ORDER — METOCLOPRAMIDE HCL 10 MG PO TABS
10.0000 mg | ORAL_TABLET | Freq: Four times a day (QID) | ORAL | Status: DC
  Administered 2012-05-24 – 2012-05-27 (×14): 10 mg via ORAL
  Filled 2012-05-24 (×18): qty 1

## 2012-05-24 MED ORDER — PROMETHAZINE HCL 25 MG PO TABS
25.0000 mg | ORAL_TABLET | Freq: Four times a day (QID) | ORAL | Status: DC | PRN
  Administered 2012-05-24 (×2): 25 mg via ORAL
  Filled 2012-05-24 (×2): qty 1

## 2012-05-24 NOTE — Progress Notes (Signed)
Patient's family member at bedside, her sister Derrill Memo would like to know, what caused the fracture in her pelvis, and if she will heal and be able to walk again? RN told [name] that she would leave a note for oncologist to address.

## 2012-05-24 NOTE — H&P (Signed)
[facility] History and Physical  Kristin [name] NFA:[id number] DOB: [birth year] DOA: 05/23/2012  Referring physician: ED PCP: [name], MD  Specialists: Oncology (called and spoke with Dr. Maryclare Labrador will see patient in AM), Palliative care (called and left message)  Chief Complaint: Pain with ambulation, HCAP  HPI: Kristin [name] is a 46 y.o. 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  Last chemo session was about 2 weeks ago, currently per mother the plan is for next session in a couple of weeks.  Unfortunately the patient presents with 2 major problems this evening, the first is that she has been having increasing pain with attempted ambulation at home, the second is new onset fever, chills, and malaise, which on eval in the ED is concerning for a new infectious PNA.  Hospitalist service has been asked to admit.  Review of Systems: 12 systems reviewed and otherwise negative.  Past Medical History  Diagnosis Date  . Breast lump   . Thyroid disease   . Hypertension   . Hx of radiation therapy 06/2010, 10/2009, 11/2011    L breast, T3-T12 spine, L-S spine  . 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 Breast cancer 09/2009    L breast, ER+, PR-, Her 2 -   Past Surgical History  Procedure Date  . Knee arthroscopy     left  . Breast surgery     left lumpectomy  . Knee cartilage surgery 2006   Social History:  reports that she quit smoking about 4 years ago. She does not have any smokeless tobacco history on file. She reports that she does not drink alcohol or use illicit  drugs. Patient lives at home.  No Known Allergies  No family history on file. No family history of Cancer, mother is alive and in good health.  Prior to Admission medications   Medication Sig Start Date End Date Taking? Authorizing Provider  anastrozole (ARIMIDEX) 1 MG tablet Take 1 tablet (1 mg total) by mouth daily. 05/09/12  Yes Amy Allegra Grana, PA  diphenhydrAMINE (BENADRYL) 25 MG tablet Take 1 tablet (25 mg total) by mouth every 6 (six) hours as needed for itching, allergies or sleep. For anxiety/sleep. 05/15/12 06/14/12 Yes Alma Concepcion Elk, MD  docusate sodium 100 MG CAPS Take 100 mg by mouth 2 (two) times daily. 05/15/12  Yes Alison Murray, MD  fluconazole (DIFLUCAN) 100 MG tablet Take 100 mg by mouth daily.   Yes Historical Provider, MD  HYDROmorphone (DILAUDID) 2 MG tablet Take 2 tablets (4 mg total) by mouth every 4 (four) hours as needed for pain. For pain 05/22/12  Yes [name], MD  levothyroxine (SYNTHROID, LEVOTHROID) 150 MCG tablet Take 150 mcg by mouth daily.   Yes Historical Provider, MD  LORazepam (ATIVAN) 0.5 MG tablet Take 1 tablet (0.5 mg total) by mouth daily. For anxiety 05/15/12  Yes Alison Murray, MD  megestrol (MEGACE ORAL) 40 MG/ML suspension Take 5 mLs (200 mg total) by mouth daily. 05/09/12  Yes Amy Allegra Grana, PA  metoCLOPramide (REGLAN) 10 MG tablet Take 10 mg by  mouth 4 (four) times daily.  04/27/12  Yes Historical Provider, MD  morphine (MS CONTIN) 15 MG 12 hr tablet Take 1 tablet (15 mg total) by mouth every 12 (twelve) hours. 05/15/12  Yes Alison Murray, MD  omeprazole (PRILOSEC) 20 MG capsule Take 20 mg by mouth daily.  04/19/12  Yes Historical Provider, MD  ondansetron (ZOFRAN) 4 MG tablet Take 1 tablet (4 mg total) by mouth every 6 (six) hours as needed for nausea. 05/22/12  Yes [name], MD  oxyCODONE-acetaminophen (PERCOCET/ROXICET) 5-325 MG per tablet Take 1-2 tablets by mouth every 8 (eight) hours as needed for pain. For pain. 05/22/12  Yes [name], MD  PRESCRIPTION MEDICATION Inject into the vein every 28 (twenty-eight) days. Zometa once monthly.   Yes Historical Provider, MD  promethazine (PHENERGAN) 25 MG tablet Take 25 mg by mouth every 6 (six) hours as needed. Nausea   Yes Historical Provider, MD  traMADol (ULTRAM) 50 MG tablet Take 1 tablet (50 mg total) by mouth every 6 (six) hours as needed. For pain 03/31/12  Yes [name], MD  venlafaxine XR (EFFEXOR-XR) 75 MG 24 hr capsule Take 2 capsules (150 mg total) by mouth daily. 03/31/12  Yes [name], MD   Physical Exam: Filed Vitals:   05/23/12 1743 05/23/12 1802 05/23/12 2139 05/23/12 2335  BP: 180/91  156/72   Pulse: 89  72   Temp: 98.9 F (37.2 C) 100.6 F (38.1 C) 99.7 F (37.6 C) 98.3 F (36.8 C)  TempSrc: Oral Rectal Rectal Oral  Resp: 20  18   SpO2: 95%  96%      General:  NAD, resting in hospital bed, uncomfortably at times  Eyes: PEERLA EOMI  ENT: Moist mucous membranes  Neck: supple w/o JVD  Cardiovascular: RRR w/o MRG  Respiratory: crackles in RUL  Abdomen: soft, nt, nd, bs+  Skin: w/o lesion or rash  Musculoskeletal: pain on the R side of pelvis, did not attempt to ambulate patient  Psychiatric: somewhat sedated given pain meds  Neurologic: AAOx3 NAD  Labs on Admission:  Basic Metabolic Panel:  Lab 05/23/12 4098  NA 137  K 3.7  CL 100  CO2 27  GLUCOSE 114*  BUN 8  CREATININE 0.79  CALCIUM 9.7  MG --  PHOS --   Liver Function Tests:  Lab 05/23/12 2045  AST 14  ALT 6  ALKPHOS 83  BILITOT 0.3  PROT 7.5  ALBUMIN 3.0*    Lab 05/23/12 2045  LIPASE 26  AMYLASE --   No results found for this basename: AMMONIA:5 in the last 168 hours CBC:  Lab 05/23/12 2045  WBC 6.7  NEUTROABS 6.0  HGB 9.2*  HCT 28.6*  MCV 73.9*  PLT 428*   Cardiac Enzymes: No results found for this basename: CKTOTAL:5,CKMB:5,CKMBINDEX:5,TROPONINI:5 in the last 168 hours  BNP (last 3 results) No results found for this  basename: PROBNP:3 in the last 8760 hours CBG: No results found for this basename: GLUCAP:5 in the last 168 hours  Radiological Exams on Admission: Dg Chest 2 View  05/23/2012  *RADIOLOGY REPORT*  Clinical Data: Altered mental status.  History of metastatic breast cancer.  CHEST - 2 VIEW  Comparison: Chest x-ray 10 of 11/2011.  Findings: The cardiac silhouette, mediastinal and hilar contours are stable.  Severe chronic lung changes are noted. Possible superimposed right upper lobe infiltrate.  No obvious destructive bone lesions.  IMPRESSION:  1.  Severe chronic lung changes. 2.  Possible superimposed right upper lobe infiltrate.   Original Report Authenticated By: P. Loralie Champagne, M.D.    Dg Pelvis Portable  05/23/2012  *RADIOLOGY REPORT*  Clinical Data: Bilateral hip pain.  No injury.  PORTABLE PELVIS  Comparison: Abdomen 05/12/2012  Findings: Heterogeneous areas of lucency and sclerosis in the right superior acetabular region and in the symphysis pubis bilaterally. Stable appearance of pathologic fractures of the right symphysis pubis region since the previous abdominal film.  There is new cortical lucency extending along the right innominate bone to the base of the superior right pubic ramus suggesting new pathologic fracture.  Heterogeneous sclerosis in the left femoral head consistent with metastasis.  SI joints and symphysis pubis are not displaced.  IMPRESSION: Changes consistent with known bony metastasis.  Apparent acute nondisplaced pathologic fracture of the right innominate bone. Stable appearance of previously demonstrated pathologic fractures in the symphysis pubis.   Original Report Authenticated By: Marlon Pel, M.D.     EKG: Independently reviewed.  Assessment/Plan Active Problems:  Breast cancer metastasized to multiple sites  Uncontrolled pain  HCAP (healthcare-associated pneumonia)  Pelvic fracture   1. Pelvic fracture - new non-displaced fracture of pelvis, will  call orthopaedics, patient is strict bed rest for the moment.  Pain control with PO ms contin from home + IV dilaudid PRN.  Ortho said patient actually can "partially weight bear" on that leg but will keep patient strict bed rest for now, they said that they could be called for formal consult in AM if day doc wanted. 2. Uncontrolled pain - likely to be an ongoing issue with new bony fractures occuring frequently due to metastatic disease, palliative care has been consulted for help with pain management. 3. HCAP - Must treat PNA process as HCAP given recent admit, Zosyn and Vanc for now, patient fairly stable, but is now on O2.  DDx includes lymphangitic spread of CA, TB is felt to be very unlikely given the appearance on CXR and acute onset of process even given patient history of TB in distant past. 4. Metastatic breast cancer - heme/onc called and made aware / consulted as per our standing instructions, they agreed with palliative care getting on board for pain management.  Code Status: Full Code, discussed with family and they agree to leaving it at this for the current time, especially given the acute PNA, does sound like they would be open to code status change Family Communication: Spoke at length with patient and mother who was at bedside Disposition Plan: Admit to inpatient  Time spent: 70 mins  GARDNER, JARED M. [facility] Pager [phone]  If 7PM-7AM, please contact night-coverage [email or web address] Password [email or web address] 05/24/2012, 12:41 AM

## 2012-05-24 NOTE — Progress Notes (Signed)
2:58 PM I agree with HPI/GPe and A/P per Dr. Eber Jones CHEST CARDIAC ABDOMEN NEURO SKIN/MUSCULAR  Chart Review:  Admission 09/15/2009-Found to have breast cancer on this admission,February 8th 2012  L breast biopsy showed a high-grade tumor which was ER 100% receptor positive but Her-2 and progesterone receptor negative. The proliferation fraction was elevated at 33%.  bone biopsy on February 11th and her T12 vertebral body indeed was found to harbor an estrogen receptor positive, progesterone receptor negative carcinoma consistent with her breast primary.   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 Grade 2, negative sentinel lymph node.  Admission 11/04/2009-nausea and vomiting, Pathological fractures  Admission 04/23/12 for Acute pancreatitis with unknown etiology  Dr. Darrall Dears synopssi of patient's cancer h/o 46 y.o. [place] woman with history of breast cancer stage IV at diagnosis February 2011, with metastases to bone and possibly developing spread to lymph nodes, lungs and liver  (1) status post 30 Gy to T-3 through T-18 October 2009  (2) status post docetaxel, cyclophosphamide and doxorubicin x6, completed in June 2011.  (3) status post Left lumpectomy and sentinel lymph node biopsy August 2011 for a 2-mm focus of residual invasive ductal carcinoma, grade 2, estrogen receptor 100% positive, progesterone receptor and HER-2 negative, with an Mib-1 of 33%, negative sentinel lymph node  (4) tamoxifen started July 2011, with evidence of progression March 2013  (5) status post radiation to Left breast completed November 2011  (6) s/p radiation to the lower spine, completed 11/15/2011  (7) fulvestrant/goserelin started 11/03/2011, with evidence of progression in September 2013  (8) status post palliative radiation to the right hip in September 2013  (9) Continuing goserelin and  adding anastrazole, beginning 05/09/2012  (10) continuing on zolendronic acid monthly     Patient Active Problem List  Diagnosis  . Breast cancer metastasized to multiple sites  . Pancreatitis, acute  . Anemia  . Anxiety  . Uncontrolled pain  . Hyponatremia  . HCAP (healthcare-associated pneumonia)  . Pelvic fracture   A/p Ct chest ordered showed metastatic spread-unlikely Pneumonic process as CT doesn't confirm/concur with CXR findings Have discussed the spread of what I believe is cancer to the patient and informed her that I will have a discussion with Dr. Darnelle Catalan (who has graciously seen her today) regarding whether further therapeutic options are available-I spoke with him today and he recommends to continue antibiotics until she is seen in am when he will address some of the Oncological processes I have attempted to determine who the Orthopedist on-call is, and will ask for a formal consult, given her 2nd Pathological # noted in the 2 months  Pleas Koch, MD [facility] [phone]

## 2012-05-24 NOTE — Progress Notes (Signed)
Patient complains of abdominal pain and medicated as needed.  Also medicated for nausea as needed.  RN gave patient an incentive spirometer and instructed patient how to use it.  Patient demonstrates use without difficulty. Patient does not complain of any shortness of breath or chest pain.  Will monitor.

## 2012-05-24 NOTE — Progress Notes (Signed)
INITIAL ADULT NUTRITION ASSESSMENT Date: 05/24/2012   Time: 10:17 AM Reason for Assessment: Nutrition risk    INTERVENTION: Diet advancement per MD. Will monitor.  ASSESSMENT: Female 46 y.o.  Dx: Pain with ambulation, pneumonia   Food/Nutrition Related Hx: Pt with metastatic breast CA, last chemotherapy 2 weeks ago. Pt recently discharged from this hospital on 05/15/12, was admitted for uncontrolled pain. Pt has been admitted 3 times in the past 6 months. Pt had reported to RD during admission last month that for the past month and half she has had uncontrolled nausea/vomiting and reports her intake has declined to only 1-2 meals/day, before then she was eating "around the clock". Pt reported her intake has improved to 4 meals/day in the past month and snacks in between. Pt denies being on any nutritional supplements at home. Pt's weight has been relatively stable in the past 2 months between 149-155 pounds. Pt still c/o nausea, however no vomiting today. Pt requested apple juice, which was provided, which pt consumed 100%.   Hx:  Past Medical History  Diagnosis Date  . Breast lump   . Thyroid disease   . Hypertension   . Hx of radiation therapy 06/2010, 10/2009, 11/2011    L breast, T3-T12 spine, L-S spine  . 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 Breast cancer 09/2009    L breast, ER+, PR-, Her 2 -   Related Meds:  Scheduled Meds:   . sodium chloride   Intravenous Once  . acetaminophen  650 mg Oral Once  . docusate sodium  100 mg Oral BID  . fentaNYL  100 mcg Intravenous Once  . fluconazole  100 mg Oral Daily  . heparin  5,000 Units Subcutaneous Q8H  .  HYDROmorphone (DILAUDID) injection  1 mg Intravenous Once  .  HYDROmorphone (DILAUDID) injection  1 mg Intravenous Once  . levothyroxine  150 mcg Oral Daily  . LORazepam  0.5 mg Oral Daily  . megestrol  200 mg Oral Daily  . metoCLOPramide  10 mg Oral QID  . morphine  15 mg Oral Q12H  . ondansetron (ZOFRAN) IV  4 mg Intravenous  Once  . ondansetron (ZOFRAN) IV  4 mg Intravenous Once  . ondansetron (ZOFRAN) IV  4 mg Intravenous Once  . pantoprazole  40 mg Oral Daily  . piperacillin-tazobactam (ZOSYN)  IV  3.375 g Intravenous Once  . piperacillin-tazobactam (ZOSYN)  IV  3.375 g Intravenous Q8H  . sodium chloride  3 mL Intravenous Q12H  . vancomycin  1,000 mg Intravenous Once  . vancomycin  1,000 mg Intravenous Q8H  . venlafaxine XR  150 mg Oral Daily   Continuous Infusions:   . sodium chloride 100 mL/hr at 05/24/12 0142   PRN Meds:.albuterol, diphenhydrAMINE, HYDROmorphone (DILAUDID) injection, ondansetron, promethazine  Ht: 5\' 9"  (175.3 cm)  Wt: 155 lb (70.308 kg)  Ideal Wt: 145 lb % Ideal Wt: 107  Usual Wt: 170 lb % Usual Wt: 91  Wt Readings from Last 10 Encounters:  05/24/12 155 lb (70.308 kg)  05/12/12 149 lb 7.6 oz (67.8 kg)  05/09/12 152 lb 3.2 oz (69.037 kg)  05/01/12 149 lb 4.8 oz (67.722 kg)  04/26/12 151 lb (68.493 kg)  03/31/12 154 lb 12.8 oz (70.217 kg)  03/14/12 159 lb 1.6 oz (72.167 kg)  12/29/11 160 lb 6.4 oz (72.757 kg)  12/14/11 161 lb 12.8 oz (73.392 kg)  12/01/11 160 lb 9.6 oz (72.848 kg)     Body mass index is 22.89  kg/(m^2).    Labs:  CMP     Component Value Date/Time   NA 134* 05/24/2012 0455   NA 136 05/09/2012 1402   K 3.6 05/24/2012 0455   K 4.5 05/09/2012 1402   CL 98 05/24/2012 0455   CL 102 05/09/2012 1402   CO2 27 05/24/2012 0455   CO2 26 05/09/2012 1402   GLUCOSE 102* 05/24/2012 0455   GLUCOSE 89 05/09/2012 1402   BUN 7 05/24/2012 0455   BUN 18.0 05/09/2012 1402   CREATININE 0.75 05/24/2012 0455   CREATININE 0.9 05/09/2012 1402   CALCIUM 9.4 05/24/2012 0455   CALCIUM 11.1* 05/09/2012 1402   PROT 7.5 05/23/2012 2045   PROT 6.9 05/09/2012 1402   ALBUMIN 3.0* 05/23/2012 2045   ALBUMIN 2.9* 05/09/2012 1402   AST 14 05/23/2012 2045   AST 15 05/09/2012 1402   ALT 6 05/23/2012 2045   ALT 9 05/09/2012 1402   ALKPHOS 83 05/23/2012 2045   ALKPHOS 73 05/09/2012  1402   BILITOT 0.3 05/23/2012 2045   BILITOT 0.30 05/09/2012 1402   GFRNONAA >90 05/24/2012 0455   GFRAA >90 05/24/2012 0455    Intake/Output Summary (Last 24 hours) at 05/24/12 1143 Last data filed at 05/24/12 0709  Gross per 24 hour  Intake    545 ml  Output    250 ml  Net    295 ml   Last BM - 10/15  Diet Order: Clear Liquid   IVF:    sodium chloride Last Rate: 100 mL/hr at 05/24/12 0142    Estimated Nutritional Needs:   Kcal: 1700-2050 Protein: 80-100g Fluid: 1.7-2L  NUTRITION DIAGNOSIS: -Inadequate oral intake (NI-2.1).  Status: Ongoing  RELATED TO: vomiting on admission  AS EVIDENCE BY: clear liquid diet  MONITORING/EVALUATION(Goals): Advance diet as tolerated to regular diet.   EDUCATION NEEDS: -No education needs identified at this time   Dietitian #: [phone]  DOCUMENTATION CODES Per approved criteria  -Not Applicable    Marshall Cork 05/24/2012, 10:17 AM

## 2012-05-24 NOTE — Progress Notes (Signed)
ANTIBIOTIC CONSULT NOTE - INITIAL  Pharmacy Consult for Vancomycin/Zosyn Indication: pneumonia /HCAP  No Known Allergies  Patient Measurements:  wt=67.8 kg   Vital Signs: Temp: 98.3 F (36.8 C) (10/16 0153) Temp src: Oral (10/16 0153) BP: 163/107 mmHg (10/16 0153) Pulse Rate: 75  (10/16 0153) Intake/Output from previous day:   Intake/Output from this shift:    Labs:  [facility] 05/23/12 2045  WBC 6.7  HGB 9.2*  PLT 428*  LABCREA --  CREATININE 0.79   The CrCl is unknown because both a height and weight (above a minimum accepted value) are required for this calculation. No results found for this basename: VANCOTROUGH:2,VANCOPEAK:2,VANCORANDOM:2,GENTTROUGH:2,GENTPEAK:2,GENTRANDOM:2,TOBRATROUGH:2,TOBRAPEAK:2,TOBRARND:2,AMIKACINPEAK:2,AMIKACINTROU:2,AMIKACIN:2, in the last 72 hours   Microbiology: Recent Results (from the past 720 hour(s))  URINE CULTURE     Status: Normal   Collection Time   05/13/12 12:40 AM      Component Value Range Status Comment   Specimen Description URINE, CLEAN CATCH   Final    Special Requests NONE   Final    Culture  Setup Time 05/13/2012 05:08   Final    Colony Count 20,OOO COLONIES/ML   Final    Culture     Final    Value: Multiple bacterial morphotypes present, none predominant. Suggest appropriate recollection if clinically indicated.   Report Status 05/14/2012 FINAL   Final     Medical History: Past Medical History  Diagnosis Date  . Breast lump   . Thyroid disease   . Hypertension   . Hx of radiation therapy 06/2010, 10/2009, 11/2011    L breast, T3-T12 spine, L-S spine  . 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 Breast cancer 09/2009    L breast, ER+, PR-, Her 2 -    Medications:  Scheduled:    . sodium chloride   Intravenous Once  . acetaminophen  650 mg Oral Once  . docusate sodium  100 mg Oral BID  . fentaNYL  100 mcg Intravenous Once  . fluconazole  100 mg Oral Daily  . heparin  5,000 Units Subcutaneous Q8H  .   HYDROmorphone (DILAUDID) injection  1 mg Intravenous Once  .  HYDROmorphone (DILAUDID) injection  1 mg Intravenous Once  . levothyroxine  150 mcg Oral Daily  . LORazepam  0.5 mg Oral Daily  . megestrol  200 mg Oral Daily  . metoCLOPramide  10 mg Oral QID  . morphine  15 mg Oral Q12H  . ondansetron (ZOFRAN) IV  4 mg Intravenous Once  . ondansetron (ZOFRAN) IV  4 mg Intravenous Once  . ondansetron (ZOFRAN) IV  4 mg Intravenous Once  . pantoprazole  40 mg Oral Daily  . piperacillin-tazobactam (ZOSYN)  IV  3.375 g Intravenous Once  . piperacillin-tazobactam (ZOSYN)  IV  3.375 g Intravenous Q8H  . sodium chloride  3 mL Intravenous Q12H  . vancomycin  1,000 mg Intravenous Once  . venlafaxine XR  150 mg Oral Daily   Infusions:    . sodium chloride 100 mL/hr at 05/24/12 0142   Assessment: 46 yo female with metastatic breast ca- admitted with PNA.  MD ordering Vancomycin/Zosyn for HCAP.  Goal of Therapy:  Vancomycin trough level 15-20 mcg/ml  Plan:   Zosyn 3.375 Gm IV q8h.    Vancomycin 1Gm IV q8h.  CrCl~101 (N)  F/U SCr/levels as needed.  Susanne Greenhouse R 05/24/2012,2:26 AM

## 2012-05-24 NOTE — Progress Notes (Signed)
Kristin [name]   DOB:[birth year]   ZO#:[id number]   JWJ#:[id number]  Subjective: readmitted with uncontrolled pain, possible PNA; this AM feels "better," but still "hurts all over." Had significant cough, yellow phlegm, low-grade fever; denies pleurisy or SOB; headache only with cough, no visual changes, dizzyness , nausea or vomiting; normal BMs, good bladder function; no family in room   Objective: middle aged African American woman examined in bed Filed Vitals:   05/24/12 0624  BP: 145/92  Pulse: 86  Temp: 98.7 F (37.1 C)  Resp: 18    Body mass index is 22.89 kg/(m^2).  Intake/Output Summary (Last 24 hours) at 05/24/12 0827 Last data filed at 05/24/12 0709  Gross per 24 hour  Intake    545 ml  Output    250 ml  Net    295 ml    Lungs auscultated anteriorly, rales bilaterally L > R  Heart rapid rate, regularrhythm  Abdomen benign  MSK no focal spinal tenderness, grade 1 bilateral ankle edema  Neuro nonfocal  Breast exam: deferred  CBG (last 3)   Basename 05/24/12 0751  GLUCAP 82     Labs:  Lab Results  Component Value Date   WBC 6.5 05/24/2012   HGB 8.8* 05/24/2012   HCT 26.3* 05/24/2012   MCV 73.7* 05/24/2012   PLT 364 05/24/2012   NEUTROABS 6.0 05/23/2012    @LASTCHEMISTRY @  Urine Studies No results found for this basename: UACOL:2,UAPR:2,USPG:2,UPH:2,UTP:2,UGL:2,UKET:2,UBIL:2,UHGB:2,UNIT:2,UROB:2,ULEU:2,UEPI:2,UWBC:2,URBC:2,UBAC:2,CAST:2,CRYS:2,UCOM:2,BILUA:2 in the last 72 hours  Basic Metabolic Panel:  Lab 05/24/12 6213 05/23/12 2045  NA 134* 137  K 3.6 3.7  CL 98 100  CO2 27 27  GLUCOSE 102* 114*  BUN 7 8  CREATININE 0.75 0.79  CALCIUM 9.4 9.7  MG -- --  PHOS -- --   GFR Estimated Creatinine Clearance: 91.8 ml/min (by C-G formula based on Cr of 0.75). Liver Function Tests:  Lab 05/23/12 2045  AST 14  ALT 6  ALKPHOS 83  BILITOT 0.3  PROT 7.5  ALBUMIN 3.0*    Lab 05/23/12 2045  LIPASE 26  AMYLASE --   No results found for this  basename: AMMONIA:5 in the last 168 hours Coagulation profile No results found for this basename: INR:5,PROTIME:5 in the last 168 hours  CBC:  Lab 05/24/12 0455 05/23/12 2045  WBC 6.5 6.7  NEUTROABS -- 6.0  HGB 8.8* 9.2*  HCT 26.3* 28.6*  MCV 73.7* 73.9*  PLT 364 428*   Cardiac Enzymes: No results found for this basename: CKTOTAL:5,CKMB:5,CKMBINDEX:5,TROPONINI:5 in the last 168 hours BNP: No components found with this basename: POCBNP:5 CBG:  Lab 05/24/12 0751  GLUCAP 82   D-Dimer No results found for this basename: DDIMER:2 in the last 72 hours Hgb A1c No results found for this basename: HGBA1C:2 in the last 72 hours Lipid Profile No results found for this basename: CHOL:2,HDL:2,LDLCALC:2,TRIG:2,CHOLHDL:2,LDLDIRECT:2 in the last 72 hours Thyroid function studies No results found for this basename: TSH,T4TOTAL,FREET3,T3FREE,THYROIDAB in the last 72 hours Anemia work up No results found for this basename: VITAMINB12:2,FOLATE:2,FERRITIN:2,TIBC:2,IRON:2,RETICCTPCT:2 in the last 72 hours Microbiology No results found for this or any previous visit (from the past 240 hour(s)).    Studies:  Dg Chest 2 View  05/23/2012  *RADIOLOGY REPORT*  Clinical Data: Altered mental status.  History of metastatic breast cancer.  CHEST - 2 VIEW  Comparison: Chest x-ray 10 of 11/2011.  Findings: The cardiac silhouette, mediastinal and hilar contours are stable.  Severe chronic lung changes are noted. Possible superimposed right upper lobe infiltrate.  No  obvious destructive bone lesions.  IMPRESSION:  1.  Severe chronic lung changes. 2.  Possible superimposed right upper lobe infiltrate.   Original Report Authenticated By: P. Loralie Champagne, M.D.    Dg Pelvis Portable  05/23/2012  *RADIOLOGY REPORT*  Clinical Data: Bilateral hip pain.  No injury.  PORTABLE PELVIS  Comparison: Abdomen 05/12/2012  Findings: Heterogeneous areas of lucency and sclerosis in the right superior acetabular region and  in the symphysis pubis bilaterally. Stable appearance of pathologic fractures of the right symphysis pubis region since the previous abdominal film.  There is new cortical lucency extending along the right innominate bone to the base of the superior right pubic ramus suggesting new pathologic fracture.  Heterogeneous sclerosis in the left femoral head consistent with metastasis.  SI joints and symphysis pubis are not displaced.  IMPRESSION: Changes consistent with known bony metastasis.  Apparent acute nondisplaced pathologic fracture of the right innominate bone. Stable appearance of previously demonstrated pathologic fractures in the symphysis pubis.   Original Report Authenticated By: Marlon Pel, M.D.       Assessment: 46 y.o. [place] woman with history of breast cancer stage IV at diagnosis February 2011, with metastases to bone and possibly developing spread to lymph nodes, lungs and liver  (1) status post 30 Gy to T-3 through T-18 October 2009  (2) status post docetaxel, cyclophosphamide and doxorubicin x6, completed in June 2011.  (3) status post Left lumpectomy and sentinel lymph node biopsy August 2011 for a 2-mm focus of residual invasive ductal carcinoma, grade 2, estrogen receptor 100% positive, progesterone receptor and HER-2 negative, with an Mib-1 of 33%, negative sentinel lymph node  (4) tamoxifen started July 2011, with evidence of progression March 2013  (5) status post radiation to Left breast completed November 2011  (6) s/p radiation to the lower spine, completed 11/15/2011  (7) fulvestrant/goserelin started 11/03/2011, with evidence of progression in September 2013  (8) status post palliative radiation to the right hip in September 2013  (9) Continuing goserelin and adding anastrazole, beginning 05/09/2012  (10) continuing on zolendronic acid monthly   (11) uncontrolled pain: worse with ambulation;  (12) anemia, chronic   Plan: agree with plan for ortho consult to  clarify patient's activity limitations (wheelchair? crutches? cane?)--PT/OT eval with possible HHN referral for home PT at discharge; also agree with involving palliative care re pain management. --  I am concerned we may be dealing with lymphangitic spread of her breast cancer, in which case advanced directives would need to be reviewed. Order placed for chest CT w contrast and I will review results. Otherwise little to add to hospitalist's excellent management of patient's likely pna. Will follow with you    Noelia Lenart C 05/24/2012

## 2012-05-24 NOTE — Progress Notes (Signed)
*  Preliminary Results* Bilateral lower extremity venous duplex completed. Bilateral lower extremities are negative for deep vein thrombosis.  05/24/2012 10:07 AM Gertie Fey, RDMS, RDCS

## 2012-05-24 NOTE — Progress Notes (Signed)
[facility] follow-up progress note (same day)  Spoke to Dr. Carola Frost (orthopedics) who graciously reviewed the films of the hip-He recommends touch down weight bearing and potentially to have a musculoskeletal orthopedist look at her for cementing and fusion of the area-if prognosis is poor recommends bed-chair weight bearing partial for transfers only as tolerated.  Pleas Koch, MD [facility] [phone]
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
## 2012-05-25 DIAGNOSIS — C7800 Secondary malignant neoplasm of unspecified lung: Secondary | ICD-10-CM

## 2012-05-25 DIAGNOSIS — C787 Secondary malignant neoplasm of liver and intrahepatic bile duct: Secondary | ICD-10-CM

## 2012-05-25 LAB — FERRITIN: Ferritin: 412 ng/mL — ABNORMAL HIGH (ref 10–291)

## 2012-05-25 LAB — GLUCOSE, CAPILLARY

## 2012-05-25 LAB — IRON AND TIBC
Saturation Ratios: 11 % — ABNORMAL LOW (ref 20–55)
TIBC: 168 ug/dL — ABNORMAL LOW (ref 250–470)
UIBC: 149 ug/dL (ref 125–400)

## 2012-05-25 LAB — RETICULOCYTES: Retic Ct Pct: 1.7 % (ref 0.4–3.1)

## 2012-05-25 MED ORDER — VANCOMYCIN HCL IN DEXTROSE 1-5 GM/200ML-% IV SOLN
1000.0000 mg | Freq: Two times a day (BID) | INTRAVENOUS | Status: DC
  Filled 2012-05-25: qty 200

## 2012-05-25 NOTE — Progress Notes (Addendum)
Patient ZO:[id number] [name]      DOB: [birth year]      WUJ:[id number]   Palliative Medicine Team at [facility] Progress Note    Subjective: patient lying in bed, pain in R hip and shoulder 2-3/10, which is tolerable for patient. Family Goals of care meeting with patient mother, Kristin [name] and patients sister Kristin [name] present. Discussed information that Dr. Darnelle Catalan disclosed with patient this morning regarding progression of breast cancer in liver and lungs, and that initiating continuing therapy would have little if any benefit, and would cause more untoward symptoms. Attempted to discuss code status with patient/family, informed them about possible risks and poor outcomes associated with resuscitation in patients with progressive life-limiting illnesses. Patient adamant at this time about remaining Full Code in the event of cardiac/pulmonary arrest.  Patient and family very distraught about new information pertinent to progression of cancer. Patient and family want to take her back home and stated they needed more time to process current information and consider planning upon discharge. Dr Mahala Menghini also talked with family and patient regarding limits of treatment continuation in presence of disease progression.    Filed Vitals:   05/25/12 1345  BP: 163/105  Pulse: 70  Temp: 97.7 F (36.5 C)  Resp: 17   Physical exam: General: Alert, appears comfortable, sitting up in bed eating  HEENT: buccal mucosa mois CHEST: coarse rhonchi, diminished at bases CVS: RRR, no audible MGR EXT: 1-2+ pedal edema ABD: soft, non-tender, BS audible NEURO: oriented x 3    Assessment and plan: Patient is 46 yo with Stg IV metastatic breast cancer, no with progressive disease and pathologic involvement of pelvic bone.  1) Pain: Recommend transitioning to oral pain regime as follows:   Total IV Dilaudid dose in past 24 hrs/8 mg IV = to 160 mg of oral Morphine, also on total of 30 Morphine oral SR  daily- adjustment by 33% for cross tolerance would be oral morphine dose of 127 mg/day.   Discontinue IV Dilaudid as needed dose  Increase Morphine SR to 60 mg every 12 hours scheduled with Dilaudid oral 4 mg every 3-4 hours for breakthrough pain    Dilaudid oral 4 mg every  3-4 hours for breakthrough pain   Consider using one time doses of Dilaudid IV 0.5 mg ONLY if oral regime not effective. Have staff call physician for order as needed to prevent over sedation  Renal function WNL consider adjuvant dosing with Naprosyn as needed or low dose scheduled steroid for bone pain  Dr Mahala Menghini called with above recommendations stated he would prefer to begin tomorrow morning and stated he will place orders. Would prefer to begin tomorrow morning when he is back on service.  2) Nausea: denies any at present continues to receive antiemetics as needed  3) Disposition: plans pending until re-meet with patient and family 05/26/12 at 2 pm, need time to process information. Will continue conversation tomorrow. Consult placed to social work for hospice choice to be offered.  Time In Time Out Total Time Spent with Patient Total Overall Time  2:00p 4:30p 60 min 90 min    Kristin [name], CNS-C Palliative Medicine Team [facility] Team Phone: [phone] Pager: [phone]

## 2012-05-25 NOTE — ED Provider Notes (Signed)
Medical screening examination/treatment/procedure(s) were conducted as a shared visit with non-physician practitioner(s) and myself.  I personally evaluated the patient during the encounter  Doug Sou, MD 05/25/12 [phone]

## 2012-05-25 NOTE — Progress Notes (Signed)
Chaplain consulted with pt on referral by palliative care.   Chaplain introduced spiritual care as resource, built initial alliance with pt and mother, and provided support around illness.  Pt lying in bed with mother Jamesetta So) at bedside.   Pt was not interested in speaking of diagnosis and rather expressed "determination" and "strength" when conversing about illness.  Expressed being from a strong faith tradition and strong family background and is determined to stay positive and fight.  Related that MD had explained that she "was already on the best medicine," and "there isn't anything more they can do," But expressed that she does not believe this and feels God will step in where medicine is unable.     Pt related being in "shock" after learning of mets, and she is uncertain how metastases were not detected on previous hospital visits.    Pt requested prayer with chaplain.  Chaplain shared prayers with pt at bedside.    Chaplain will continue to follow for support as admission progresses.   Belva Crome  MDiv, Chaplain

## 2012-05-25 NOTE — Progress Notes (Signed)
Kristin [name]   DOB:[birth year]   ZO#:[id number]   JWJ#:[id number]  Subjective: patient had an "OK" day yesterday; some cough, yellow phlegm, but no pleurisy or SOB at rest; no BM past 24-48 hrs; no pain while in bed; pain occurs w ambulation or on transfer to w/c; no family in room  Objective: middle aged African American woman examined in bed Filed Vitals:   05/25/12 0600  BP: 144/79  Pulse: 80  Temp: 98.1 F (36.7 C)  Resp: 18    Body mass index is 22.89 kg/(m^2).  Intake/Output Summary (Last 24 hours) at 05/25/12 0907 Last data filed at 05/25/12 0600  Gross per 24 hour  Intake   2835 ml  Output    500 ml  Net   2335 ml    Lungs auscultated anteriorly, rales bilaterally L > R  Heart rapid rate, regular rhythm  Abdomen soft, +BS  MSK no focal spinal tenderness, grade 1 bilateral ankle edema  Neuro nonfocal  Breast exam: deferred  CBG (last 3)   Basename 05/25/12 0734 05/24/12 0751  GLUCAP 75 82     Labs:  Lab Results  Component Value Date   WBC 6.5 05/24/2012   HGB 8.8* 05/24/2012   HCT 26.3* 05/24/2012   MCV 73.7* 05/24/2012   PLT 364 05/24/2012   NEUTROABS 6.0 05/23/2012    @LASTCHEMISTRY @  Urine Studies No results found for this basename: UACOL:2,UAPR:2,USPG:2,UPH:2,UTP:2,UGL:2,UKET:2,UBIL:2,UHGB:2,UNIT:2,UROB:2,ULEU:2,UEPI:2,UWBC:2,URBC:2,UBAC:2,CAST:2,CRYS:2,UCOM:2,BILUA:2 in the last 72 hours  Basic Metabolic Panel:  Lab 05/24/12 6213 05/23/12 2045  NA 134* 137  K 3.6 3.7  CL 98 100  CO2 27 27  GLUCOSE 102* 114*  BUN 7 8  CREATININE 0.75 0.79  CALCIUM 9.4 9.7  MG -- --  PHOS -- --   GFR Estimated Creatinine Clearance: 91.8 ml/min (by C-G formula based on Cr of 0.75). Liver Function Tests:  Lab 05/23/12 2045  AST 14  ALT 6  ALKPHOS 83  BILITOT 0.3  PROT 7.5  ALBUMIN 3.0*    Lab 05/23/12 2045  LIPASE 26  AMYLASE --   No results found for this basename: AMMONIA:5 in the last 168 hours Coagulation profile No results found for this  basename: INR:5,PROTIME:5 in the last 168 hours  CBC:  Lab 05/24/12 0455 05/23/12 2045  WBC 6.5 6.7  NEUTROABS -- 6.0  HGB 8.8* 9.2*  HCT 26.3* 28.6*  MCV 73.7* 73.9*  PLT 364 428*   Cardiac Enzymes: No results found for this basename: CKTOTAL:5,CKMB:5,CKMBINDEX:5,TROPONINI:5 in the last 168 hours BNP: No components found with this basename: POCBNP:5 CBG:  Lab 05/25/12 0734 05/24/12 0751  GLUCAP 75 82   D-Dimer No results found for this basename: DDIMER:2 in the last 72 hours Hgb A1c No results found for this basename: HGBA1C:2 in the last 72 hours Lipid Profile No results found for this basename: CHOL:2,HDL:2,LDLCALC:2,TRIG:2,CHOLHDL:2,LDLDIRECT:2 in the last 72 hours Thyroid function studies No results found for this basename: TSH,T4TOTAL,FREET3,T3FREE,THYROIDAB in the last 72 hours Anemia work up  [employer] 05/25/12 0530  VITAMINB12 --  FOLATE --  FERRITIN --  TIBC --  IRON --  RETICCTPCT 1.7   Microbiology No results found for this or any previous visit (from the past 240 hour(s)).    Studies:  Dg Chest 2 View  05/23/2012  *RADIOLOGY REPORT*  Clinical Data: Altered mental status.  History of metastatic breast cancer.  CHEST - 2 VIEW  Comparison: Chest x-ray 10 of 11/2011.  Findings: The cardiac silhouette, mediastinal and hilar contours are stable.  Severe chronic lung changes  are noted. Possible superimposed right upper lobe infiltrate.  No obvious destructive bone lesions.  IMPRESSION:  1.  Severe chronic lung changes. 2.  Possible superimposed right upper lobe infiltrate.   Original Report Authenticated By: P. Loralie Champagne, M.D.    Ct Chest W Contrast  05/24/2012  *RADIOLOGY REPORT*  Clinical Data: Carcinoma.  Evaluate for pneumonia versus lymphangitic spread of tumor.  CT CHEST WITH CONTRAST  Technique:  Multidetector CT imaging of the chest was performed following the standard protocol during bolus administration of intravenous contrast.  Contrast: 80mL  OMNIPAQUE IOHEXOL 300 MG/ML  SOLN .  Comparison: chest radiograph, 05/23/2012.  Chest CT, 02/21/2012  Findings: At the thoracic inlet, there are supraclavicular, left apical left superior mediastinal lymph nodes.  Reference measurement of a right supraclavicular lymph node is 1 cm in short axis.  This is larger than the 7.7 mm measurement previously.  A left apical/superior mediastinal node has increased from 12 mm to 15 mm.  A left superior mediastinal lymph node below this is similar measuring just over 12 mm.  A prevascular node along the left margin of the aortic arch currently measures 11 mm where  it had measured 10 mm.  Soft tissues surrounds the right hilar and upper lobe bronchi and pulmonary vessels.  This has increased when compared the prior exam.  Its margins are regular making a measurements somewhat problematic, but on the image number 23 of series 5, it measures approximately 4 cm x 6.8 cm in size where had measured 3.1 cm x 4.4 cm in size.  Soft tissue component of this just above the right pulmonary artery currently measures 24 mm x 30 mm where had measured 25 mm x 22 mm.  There are now small bilateral pleural effusions.  There are areas of coarse reticular opacity with associated pleural thickening in the left apex most of which is stable.  A few small areas represent new coarse reticular opacity.  Coarse reticular nodular opacity in the right upper lobe has increased, extending along the interstitium.  There is some mild dependent subsegmental atelectasis in the lower lobes.  In the upper abdomen, a mixed density lesion arises from the far lateral margin of the lateral segment of the left lobe measuring 6.1 cm x 2.8 cm in size.  On the prior study this same area measured just over 3.3 cm in long axis.  There is a low density lesion arising from the medial margin of the posterior segment of the right lobe.  This was only a sub centimeter lesion previously, now measuring 3.1 cm in size.  There is  an irregular ill-defined nodular soft tissue above the pancreatic tail between it and the stomach that is likely metastatic adenopathy.  This has increased from prior study.  There is a destructive bone lesion of the right scapula involving the scapular neck and glenoid.  This was not completely imaged on the prior study, but where images are compatible, this has advanced.  Other areas of sclerotic bone metastatic disease are essentially stable.  IMPRESSION: Worsening metastatic disease from breast carcinoma.  Findings include mediastinal and supraclavicular adenopathy, abnormal soft tissues surrounding hila, vertically on the right where this has significantly increased, as well as reticular nodular interstitial densities in the right upper lobe suggesting lymphangitic spread of carcinoma.  Bilateral pleural effusions have developed since the prior exam.  There are two liver lesions that have increased from the prior exam and there is increased presumed metastatic adenopathy in the left  upper quadrant.  Bony metastatic disease is mostly stable although there is a new destructive lesion in the right scapula.   Original Report Authenticated By: Domenic Moras, M.D.    Dg Pelvis Portable  05/23/2012  *RADIOLOGY REPORT*  Clinical Data: Bilateral hip pain.  No injury.  PORTABLE PELVIS  Comparison: Abdomen 05/12/2012  Findings: Heterogeneous areas of lucency and sclerosis in the right superior acetabular region and in the symphysis pubis bilaterally. Stable appearance of pathologic fractures of the right symphysis pubis region since the previous abdominal film.  There is new cortical lucency extending along the right innominate bone to the base of the superior right pubic ramus suggesting new pathologic fracture.  Heterogeneous sclerosis in the left femoral head consistent with metastasis.  SI joints and symphysis pubis are not displaced.  IMPRESSION: Changes consistent with known bony metastasis.  Apparent acute  nondisplaced pathologic fracture of the right innominate bone. Stable appearance of previously demonstrated pathologic fractures in the symphysis pubis.   Original Report Authenticated By: Marlon Pel, M.D.       Assessment: 46 y.o. [place] woman with history of breast cancer stage IV at diagnosis February 2011, with metastases to bone, lymph nodes, lungs and liver  (1) status post 30 Gy to T-3 through T-18 October 2009  (2) status post docetaxel, cyclophosphamide and doxorubicin x6, completed in June 2011.  (3) status post Left lumpectomy and sentinel lymph node biopsy August 2011 for a 2-mm focus of residual invasive ductal carcinoma, grade 2, estrogen receptor 100% positive, progesterone receptor and HER-2 negative, with an Mib-1 of 33%, negative sentinel lymph node  (4) tamoxifen started July 2011, with evidence of progression March 2013  (5) status post radiation to Left breast completed November 2011  (6) s/p radiation to the lower spine, completed 11/15/2011  (7) fulvestrant/goserelin started 11/03/2011, with evidence of progression in September 2013  (8) status post palliative radiation to the right hip in September 2013  (9) Continuing goserelin and adding anastrazole, beginning 05/09/2012  (10) continuing on zolendronic acid monthly   (11) uncontrolled pain: worse with ambulation;  (12) anemia, chronic   Plan: the chest CT is quite alarming--there is clear disease progression in the lungs and liver. I discussed this with Kristin [name]. We can do exemestane/everolimus but that can cause mucositis and more importantly pneumonitis, and in her current situation it would be hard for her to tolerate that.She understands we can give her more chemo, but she has already had the most active agents; chemo at this point would be using third-line drugs and they are much more likely to cause her nausea, hair loss, fatigue, low blood counts and neuropathy than to control her cancer or prolong her  life.  For that reason my suggestion to Kristin [name] is that we establish a DNR, ask Hospice to become involved, and consider transfer to [facility], where she would be safe and comfortable. (I visit there every Friday so could see her weekly.) Of course her family could take her out to eat, etc. If they wished, so long as she could be transported in a w/c.  Otherwise the patient will likely need 24/7 support at home, with the family bearing the brunt of her care though with some help from [facility] and possibly an aide.  I will be out of town until Monday; please let me know if I can be of further help at this point.  Accordingly   Kristin [name] C 05/25/2012

## 2012-05-25 NOTE — Progress Notes (Signed)
[facility]  Patient Status: Active (receiving services up to time of hospitalization)  [facility] is providing the following services: RN, PT, OT and MSW.  Noted potential for Hospice.   Will follow for final d/c plan.  If patient discharges after hours, please call [phone].   Norberta Keens, RN, BSN [phone] 05/25/2012, 12:50 PM

## 2012-05-25 NOTE — Progress Notes (Signed)
PROGRESS NOTE  Giah Fickett ZOX:[id number] DOB: 06-09-1966 DOA: 05/23/2012 PCP: [name], MD  Brief narrative: 46 yr old with Stage 4 lung cancer admitted to [facility] hospital with Pneumonic type symptoms, new acute pelvic fracture.   Past medical history-As per Problem list Chart Review:  Admission 09/15/2009-Found to have breast cancer on this admission,February 8th 2012 L breast biopsy showed a high-grade tumor which was ER 100% receptor positive but Her-2 and progesterone receptor negative. The proliferation fraction was elevated at 33%. bone biopsy on February 11th and her T12 vertebral body indeed was found to harbor an estrogen receptor positive, progesterone receptor negative carcinoma consistent with her breast primary.  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 Grade 2, negative sentinel lymph node. Admission 11/04/2009-nausea and vomiting, Pathological fractures  Admission 04/23/12 for Acute pancreatitis with unknown etiology Dr. Darrall Dears synopssi of patient's cancer h/o  46 y.o. [place] woman with history of breast cancer stage IV at diagnosis February 2011, with metastases to bone and possibly developing spread to lymph nodes, lungs and liver  (1) status post 30 Gy to T-3 through T-18 October 2009  (2) status post docetaxel, cyclophosphamide and doxorubicin x6, completed in June 2011.  (3) status post Left lumpectomy and sentinel lymph node biopsy August 2011 for a 2-mm focus of residual invasive ductal carcinoma, grade 2, estrogen receptor 100% positive, progesterone receptor and HER-2 negative, with an Mib-1 of 33%, negative sentinel lymph node  (4) tamoxifen started July 2011, with evidence of progression March 2013  (5) status post radiation to Left breast completed November 2011  (6) s/p radiation to the lower spine, completed 11/15/2011  (7) fulvestrant/goserelin started  11/03/2011, with evidence of progression in September 2013  (8) status post palliative radiation to the right hip in September 2013  (9) Continuing goserelin and adding anastrazole, beginning 05/09/2012  (10) continuing on zolendronic acid monthly    Consultants:  Palliative medicine  Oncology  Procedures:  CT chest showing widespread metastatic disease  CXR=?ONa  Antibiotics:  Vancomycin/zosyn/fluconazole 10/16-->10/17   Subjective  Awake, still coughing, has sputum, no SOB/CP blurred vision double vision Wishing to have a shower but realizes she has a fracture   Objective    Interim History: Review chart, reviewed palliative and oncologist note  Telemetry:   Objective: Filed Vitals:   05/25/12 0600 05/25/12 0930 05/25/12 1100 05/25/12 1345  BP: 144/79 130/75 142/80 163/105  Pulse: 80 81 79 70  Temp: 98.1 F (36.7 C) 98.3 F (36.8 C) 98.9 F (37.2 C) 97.7 F (36.5 C)  TempSrc: Oral Oral Oral Oral  Resp: 18 16 15 17   Height:      Weight:      SpO2: 92% 95% 94% 95%    Intake/Output Summary (Last 24 hours) at 05/25/12 1557 Last data filed at 05/25/12 1432  Gross per 24 hour  Intake 3913.33 ml  Output   1000 ml  Net 2913.33 ml    Exam:  General: Alert oriented African American female looking younger than stated age Cardiovascular: S1-S2 no murmur rub or gallop, no added sound Respiratory: Clinically clear Abdomen: Soft nontender nondistended no rebound Skin no rash Neuro motor grossly intact  Data Reviewed: Basic Metabolic Panel:  Lab 05/24/12 8119 05/23/12 2045  NA 134* 137  K 3.6 3.7  CL 98 100  CO2 27 27  GLUCOSE 102* 114*  BUN 7 8  CREATININE 0.75 0.79  CALCIUM 9.4 9.7  MG -- --  PHOS -- --   Liver Function Tests:  Lab 05/23/12 2045  AST 14  ALT 6  ALKPHOS 83  BILITOT 0.3  PROT 7.5  ALBUMIN 3.0*    Lab 05/23/12 2045  LIPASE 26  AMYLASE --   No results found for this basename: AMMONIA:5 in the last 168  hours CBC:  Lab 05/24/12 0455 05/23/12 2045  WBC 6.5 6.7  NEUTROABS -- 6.0  HGB 8.8* 9.2*  HCT 26.3* 28.6*  MCV 73.7* 73.9*  PLT 364 428*   Cardiac Enzymes: No results found for this basename: CKTOTAL:5,CKMB:5,CKMBINDEX:5,TROPONINI:5 in the last 168 hours BNP: No components found with this basename: POCBNP:5 CBG:  Lab 05/25/12 0734 05/24/12 0751  GLUCAP 75 82    No results found for this or any previous visit (from the past 240 hour(s)).   Studies:              All Imaging reviewed and is as per above notation   Scheduled Meds:    . sodium chloride   Intravenous Once  . acetaminophen  650 mg Oral Once  . docusate sodium  100 mg Oral BID  . heparin  5,000 Units Subcutaneous Q8H  . levothyroxine  150 mcg Oral Daily  . LORazepam  0.5 mg Oral Daily  . megestrol  200 mg Oral Daily  . metoCLOPramide  10 mg Oral QID  . morphine  15 mg Oral Q12H  . pantoprazole  40 mg Oral Daily  . sodium chloride  3 mL Intravenous Q12H  . venlafaxine XR  150 mg Oral Daily  . vitamin A & D      . DISCONTD: fluconazole  100 mg Oral Daily  . DISCONTD: piperacillin-tazobactam (ZOSYN)  IV  3.375 g Intravenous Q8H  . DISCONTD: vancomycin  1,000 mg Intravenous Q8H  . DISCONTD: vancomycin  1,000 mg Intravenous Q12H   Continuous Infusions:   . sodium chloride 100 mL/hr (05/24/12 1632)     Assessment/Plan: 1. Stage IV breast cancer with metastasis-patient poor candidate for third line chemotherapy-oncologist has requested palliative medicine to offer choice of hospice. Discussions ongoing-pain being managed by palliative medicine 2. New acute nondisplaced pathological fracture of right innominate bone-discussed with Dr. Carola Frost of orthopedics 05/24/2012 options and he recommended partial weight bearing with bed to chair transfers-she is a poor candidate for any acute intervention 3. Unlikely pneumonia-patient was on broad-spectrum antibiotics on admission-CT scan confirms lymphangitic spread of  the cancer-she does not need antibiotics 4. Thyroid disease-continue Synthroid 150 mcg daily [known risk for osteoporosis]\ 5. Depression-continue Effexor 150 mg every 24 hourly, less than 2.5 mg daily 6. Acute pain from multiple fractures-continue morphine long-acting 50 mg every 12 hour, Dilantin at 0.5-1 mg every 2 hours when necessary-we will transition to by mouth medications in the morning 7. Constipation continue Colace 100 mg twice a day  Code Status: Full Family Communication: Discussed in detail with family and reiterated Dr. Darrall Dears impression of her case with palliative medicine-discussions ongoing regarding best possible options for her Disposition Plan: Pending   Pleas Koch, MD  [facility] Pager [phone] 05/25/2012, 3:57 PM    LOS: 2 days

## 2012-05-25 NOTE — Progress Notes (Signed)
ANTIBIOTIC CONSULT NOTE - FOLLOW UP  Pharmacy Consult for vancomycin Indication: pneumonia  No Known Allergies  Patient Measurements: Height: 5\' 9"  (175.3 cm) Weight: 155 lb (70.308 kg) IBW/kg (Calculated) : 66.2  Adjusted Body Weight:   Vital Signs: Temp: 98.3 F (36.8 C) (10/17 0930) Temp src: Oral (10/17 0930) BP: 130/75 mmHg (10/17 0930) Pulse Rate: 81  (10/17 0930) Intake/Output from previous day: 10/16 0701 - 10/17 0700 In: 3380 [I.V.:2830; IV Piggyback:550] Out: 500 [Urine:500] Intake/Output from this shift:    Labs:  [facility] 05/24/12 0455 05/23/12 2045  WBC 6.5 6.7  HGB 8.8* 9.2*  PLT 364 428*  LABCREA -- --  CREATININE 0.75 0.79   Estimated Creatinine Clearance: 91.8 ml/min (by C-G formula based on Cr of 0.75).  Basename 05/25/12 0953  VANCOTROUGH 24.8*  VANCOPEAK --  Drue Dun --  GENTTROUGH --  GENTPEAK --  GENTRANDOM --  TOBRATROUGH --  Nolen Mu --  TOBRARND --  AMIKACINPEAK --  AMIKACINTROU --  AMIKACIN --     Microbiology: Recent Results (from the past 720 hour(s))  URINE CULTURE     Status: Normal   Collection Time   05/13/12 12:40 AM      Component Value Range Status Comment   Specimen Description URINE, CLEAN CATCH   Final    Special Requests NONE   Final    Culture  Setup Time 05/13/2012 05:08   Final    Colony Count 20,OOO COLONIES/ML   Final    Culture     Final    Value: Multiple bacterial morphotypes present, none predominant. Suggest appropriate recollection if clinically indicated.   Report Status 05/14/2012 FINAL   Final     Anti-infectives     Start     Dose/Rate Route Frequency Ordered Stop   05/24/12 1000   fluconazole (DIFLUCAN) tablet 100 mg        100 mg Oral Daily 05/24/12 0040     05/24/12 1000   vancomycin (VANCOCIN) IVPB 1000 mg/200 mL premix        1,000 mg 200 mL/hr over 60 Minutes Intravenous Every 8 hours 05/24/12 0231     05/24/12 0800  piperacillin-tazobactam (ZOSYN) IVPB 3.375 g       3.375  g 12.5 mL/hr over 240 Minutes Intravenous Every 8 hours 05/24/12 0050     05/23/12 2200   vancomycin (VANCOCIN) IVPB 1000 mg/200 mL premix        1,000 mg 200 mL/hr over 60 Minutes Intravenous  Once 05/23/12 2132 05/24/12 0310   05/23/12 2200  piperacillin-tazobactam (ZOSYN) IVPB 3.375 g       3.375 g 12.5 mL/hr over 240 Minutes Intravenous  Once 05/23/12 2132 05/24/12 0252          Assessment: 46 yo female admitted with history of breast cancer, now with probable PNA. CrCl~101 (N). Here for uncontrolled pain - Found to have pelvic fracture adn CT reveals disease progression.   10/16 >> zosyn >> 10/16 >> vanco >>   Tmax:Afeb WBCs:WNL Renal: stable, SCr =0.75   10/16 sputum: to be collected  10/17: vancomycin trough = 24.8 on vancomyin 1gm IV q8h (level drawn prior to 5th dose)  Goal of Therapy:  Vancomycin trough level 15-20 mcg/ml  Plan:  - vancomycin SUPRAtherapeutic this am, will change dose to 1gm IV q12h for est new trough of 14.83mcg/ml - Continue zosyn 3.375gm IV q8h with 4h infusion  Dannielle Huh 05/25/2012,10:36 AM

## 2012-05-26 ENCOUNTER — Encounter: Payer: Self-pay | Admitting: Radiation Oncology

## 2012-05-26 ENCOUNTER — Telehealth: Payer: Self-pay | Admitting: *Deleted

## 2012-05-26 DIAGNOSIS — K5900 Constipation, unspecified: Secondary | ICD-10-CM

## 2012-05-26 LAB — CBC
HCT: 23.7 % — ABNORMAL LOW (ref 36.0–46.0)
HCT: 25 % — ABNORMAL LOW (ref 36.0–46.0)
Hemoglobin: 7.8 g/dL — ABNORMAL LOW (ref 12.0–15.0)
Hemoglobin: 8.4 g/dL — ABNORMAL LOW (ref 12.0–15.0)
MCH: 24.5 pg — ABNORMAL LOW (ref 26.0–34.0)
MCHC: 33.6 g/dL (ref 30.0–36.0)
MCV: 74.3 fL — ABNORMAL LOW (ref 78.0–100.0)
RBC: 3.19 MIL/uL — ABNORMAL LOW (ref 3.87–5.11)

## 2012-05-26 LAB — SAMPLE TO BLOOD BANK

## 2012-05-26 LAB — ABO/RH: ABO/RH(D): O POS

## 2012-05-26 LAB — GLUCOSE, CAPILLARY: Glucose-Capillary: 126 mg/dL — ABNORMAL HIGH (ref 70–99)

## 2012-05-26 LAB — PREPARE RBC (CROSSMATCH)

## 2012-05-26 MED ORDER — MORPHINE SULFATE ER 30 MG PO TBCR
60.0000 mg | EXTENDED_RELEASE_TABLET | Freq: Two times a day (BID) | ORAL | Status: DC
  Administered 2012-05-26 – 2012-05-27 (×2): 60 mg via ORAL
  Filled 2012-05-26 (×2): qty 2

## 2012-05-26 MED ORDER — HYDROMORPHONE HCL 2 MG PO TABS
1.0000 mg | ORAL_TABLET | ORAL | Status: DC | PRN
  Administered 2012-05-26: 2 mg via ORAL
  Administered 2012-05-26: 1 mg via ORAL
  Administered 2012-05-27 (×3): 2 mg via ORAL
  Filled 2012-05-26 (×5): qty 1

## 2012-05-26 MED ORDER — BISACODYL 10 MG RE SUPP: 10.0000 mg | Freq: Once | RECTAL | Status: DC

## 2012-05-26 MED ORDER — BISACODYL 10 MG RE SUPP: 10.0000 mg | Freq: Every day | RECTAL | Status: DC | PRN

## 2012-05-26 MED ORDER — SENNOSIDES-DOCUSATE SODIUM 8.6-50 MG PO TABS
1.0000 | ORAL_TABLET | Freq: Two times a day (BID) | ORAL | Status: DC
  Administered 2012-05-26 – 2012-05-27 (×3): 1 via ORAL
  Filled 2012-05-26 (×4): qty 1

## 2012-05-26 MED ORDER — HYDROMORPHONE HCL 2 MG PO TABS
1.0000 mg | ORAL_TABLET | ORAL | Status: DC | PRN
  Administered 2012-05-26 (×2): 1 mg via ORAL
  Filled 2012-05-26 (×2): qty 1

## 2012-05-26 NOTE — Progress Notes (Signed)
Palliative Medicine Team SW Pt discussed in PMT rounds, referred for psychosocial support. From speaking with Chaplain Burnis Kingfisher and chart review, made aware of [facility] support staff's involvement and [facility] Liaison's provision of education. Needs at this time appear to be disposition related, to be managed by unit CSW. Will be available as needed for emotional support, but want to give family time to process and make decisions with appropriate support staff.   Kristin [name], [place] Pager [phone]

## 2012-05-26 NOTE — Telephone Encounter (Signed)
WILL DR.MAGRINAT BE PT.'S ATTENDING PHYSICIAN? MAY HOSPICE PHYSICIAN PRESCRIBE SYMPTOM MANAGEMENT? SPOKE TO DR.MAGRINAT'S NURSE, VAL DODD,RN. DR.MAGRINAT WILL BE PT.'S ATTENDING PHYSICIAN AND HOSPICE PHYSICIAN MAY PRESCRIBE SYMPTOM MANAGEMENT. NOTIFIED YVETTE. SHE VOICES UNDERSTANDING.

## 2012-05-26 NOTE — Progress Notes (Addendum)
Notified by Kristin [name], patient and family request services of [facility]) after discharge. Patient information reviewed with  Dr Elliot Gurney, [facility] Medical Director hospice eligible with dx: metastatic breast cancer.  Spoke with patient and mother Kristin [name] at bedside to initiate education related to hospice services, philosophy and team approach to care with good understanding voiced by both patient and mother.  Per discussion with patient  plan is to d/c home tomorrow, Saturday 05/27/12; discussed transportation needs as patient has increased pain with minimal movement and very limited mobility due to acute pelvic fracture they request ambulance transport to home -  Kristin [name] was under the impression that [facility] would transport patient home; this writer explained to Kristin [name] and her mother, that Kristin [name] is not a hospice patient until she is admitted to services which will happen tomorrow after her discharge home and the admission RN from [facility] sees them at [place] home- discussed that an ambulance could be arranged by the hospital CSW to take patient home and this would be a charge to the patient - Kristin [name] and her mother voiced understanding and did repeat that they want patient transported home- a voice message was left for CSW Toma Copier and a call placed to [facility] Kristin [name] to inform - the charge RN was also notified of request.  Discussed DME needs - patient at this time is declining a hospital bed-  delivery of Complete hospice Package C: walker with wheels; 3n1BSC;Tub seat with back; Transfer bench; wheelchair was requested and Kristin [name] with [name] and Kristin [name] [facility] were aware - Kristin [name], patient's mother, is the contact to arrange delivery @ c: [phone] *NOTE this RN was notified by patient's mother & [facility] that the address listed on EPIC facesheet for [place] is incorrect patient's address is: [address] [name], [address] This RN contacted [facility] and  spoke with Kristin [name] to correct address  Dr Darnelle Catalan will be attending physician working with [facility]' pharmacy [facility] on [address] [facility] [address] Initial paperwork faxed to [facility].  **Completed d/c summary will need to be faxed to [facility] @ [phone] when final  Please notify [facility] when patient is ready to leave unit at d/c call [phone] if after;  [facility] information and contact numbers also given to Kristin [name] patient's mother during visit.   Above information shared with Kristin [name] [facility] Please call with any questions or concerns   [name], RN 05/26/2012, 5:01 PM [facility] Palliative Medicine Team RN Liaison [phone]

## 2012-05-26 NOTE — Progress Notes (Addendum)
Patient Kristin [name]      DOB: 10/28/1965      WGN:[id number]   Palliative Medicine Team at [facility] Progress Note    Subjective:Patent OOB in w/c, working well with PT on transfer techniques (TDW bearing R side), multiple family members at bedside.   Filed Vitals:   05/26/12 1515  BP: 150/96  Pulse: 105  Temp: 98.9 F (37.2 C)  Resp: 18   Physical exam:  General: Alert, appears comfortable, sitting up bedside HEENT: buccal mucosa moist  CHEST fine scattered rhonchi upper lobes CVS: RRR, no audible MGR  EXT: 1-2+ pedal edema  ABD: soft, non-tender, BS audible  NEURO: oriented x 3    Assessment and plan: Patient is 46 yo with Stg IV metastatic breast cancer, no with progressive disease and pathologic involvement of pelvic bone. Current scans indicate progressive disease in lungs and liver.  1) Code Status: wants to remain full code, explanation of burdens and risks (see note 10/17) 2) Pain: transitioned to oral pain med regime based on prior IV use. Educated family on current regime as well as side effects 3) Constipation: last BM 4 days ago, will order Dulcolax, also will change Colace to Senakot-S twice daily 4) Nausea: denies any today 5) Disposition: plan for discharge home with hospice support, case manager Algernon Huxley aware, choice to be offered.  Time In Time Out Total Time Spent with Patient Total Overall Time  2:00p 3:00p 30 min 60 min   Greater than 50%  of this time was spent counseling and coordinating care related to the above assessment and plan. Freddie Breech, CNS-C Palliative Medicine Team [facility] Team Phone: [phone] Pager: [phone]

## 2012-05-26 NOTE — Progress Notes (Signed)
Patient NG:[id number] Blethen      DOB: 02-19-1966      LKG:[id number]  Spoke with patients mother this morning Lanay Zinda [phone]), she is with patient in room. She was agreeable to have Forrestine Him from [facility] come to give them information about hospice services and support this morning. Forrestine Him notified.  Freddie Breech, CNS-C Palliative Medicine Team [facility] Team Phone: [phone] Pager: [phone]

## 2012-05-26 NOTE — Progress Notes (Signed)
Spoke with pts mother about needed equipment, she stated pt needs a wheelchair, rolling walker with wheels, tub bench, bed side commode and wheel chair cushion. Communicated this to [employer] with [facility]. She asked me to call [facility] about the needed equipment, which  I did.

## 2012-05-26 NOTE — Progress Notes (Signed)
PROGRESS NOTE  Kristin [name] JYN:[id number] DOB: [birth year] DOA: 05/23/2012 PCP: [name], MD  Brief narrative: 46 yr old with Stage 4 lung cancer admitted to [facility] hospital with Pneumonic type symptoms, new acute pelvic fracture. Patient had a CT scan of the chest 05/24/2012 which did not concur with pneumonia diagnosis but instead suggested lymphangitic spread of her primary breast cancer. Discussions were undertaken by oncologist, hospitalists and palliative medicine regarding best course of care and ultimately it was decided by patient that she would like to explore options for third line chemotherapy as an outpatient with Dr. Angela Burke.  Orthopedics was informally consulted and recommended partial weightbearing on the fracture with pain management- recommended if her prognosis was not so poor to have a musculoskeletal orthopedist review her however given her likely poor outcomes, and this was not pursued-physical therapy occupational therapy worked with the patient and recommended home health PT for family education in home environment-they noted that she was 2+ assist for safety and noted that she was able to perform school pivot transfer and stand to transfer with rolling walker wheelchair Palliative medicine was involved in delineating the goals of care and pain management-hospice was explained to the patient by hospice nurse and coordinator. It was noted on 10/18 that she was slightly anemic, IV fluids were discontinued and that will be rechecked this evening. If her hemoglobin remains above 7, patient can potentially be discharged on the morning of 10/19  Past medical history-As per Problem list Chart Review:  Admission 09/15/2009-Found to have breast cancer on this admission,February 8th 2012 L breast biopsy showed a high-grade tumor which was ER 100% receptor positive but Her-2 and progesterone receptor negative. The proliferation fraction was elevated at 33%. bone biopsy on February  11th and her T12 vertebral body indeed was found to harbor an estrogen receptor positive, progesterone receptor negative carcinoma consistent with her breast primary.  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 Grade 2, negative sentinel lymph node. Admission 11/04/2009-nausea and vomiting, Pathological fractures  Admission 04/23/12 for Acute pancreatitis with unknown etiology Dr. Darrall Dears synopssi of patient's cancer h/o  46 y.o. [place] woman with history of breast cancer stage IV at diagnosis February 2011, with metastases to bone and possibly developing spread to lymph nodes, lungs and liver  (1) status post 30 Gy to T-3 through T-18 October 2009  (2) status post docetaxel, cyclophosphamide and doxorubicin x6, completed in June 2011.  (3) status post Left lumpectomy and sentinel lymph node biopsy August 2011 for a 2-mm focus of residual invasive ductal carcinoma, grade 2, estrogen receptor 100% positive, progesterone receptor and HER-2 negative, with an Mib-1 of 33%, negative sentinel lymph node  (4) tamoxifen started July 2011, with evidence of progression March 2013  (5) status post radiation to Left breast completed November 2011  (6) s/p radiation to the lower spine, completed 11/15/2011  (7) fulvestrant/goserelin started 11/03/2011, with evidence of progression in September 2013  (8) status post palliative radiation to the right hip in September 2013  (9) Continuing goserelin and adding anastrazole, beginning 05/09/2012  (10) continuing on zolendronic acid monthly    Consultants:  Palliative medicine  Oncology  Procedures:  CT chest showing widespread metastatic disease  CXR=?PNa  Hip x-rays  Antibiotics:  Vancomycin/zosyn/fluconazole 10/16-->10/17   Subjective  Awake, still coughing, has sputum, no SOB/CP blurred vision double vision-states that she's and 7 on 10 pain  "all  over" When I ask her specifically what is causing most of her pain, she states that it is her upper right hip and her stomach. She believes that she has pneumonia   Objective    Interim History: Review chart, reviewed palliative and oncologist note  Telemetry:    Objective: Filed Vitals:   05/25/12 1100 05/25/12 1345 05/25/12 2200 05/26/12 0623  BP: 142/80 163/105 153/94 148/78  Pulse: 79 70 82 81  Temp: 98.9 F (37.2 C) 97.7 F (36.5 C) 97.8 F (36.6 C) 98.3 F (36.8 C)  TempSrc: Oral Oral Oral Oral  Resp: 15 17 18 18   Height:      Weight:      SpO2: 94% 95% 98% 95%    Intake/Output Summary (Last 24 hours) at 05/26/12 1010 Last data filed at 05/26/12 0535  Gross per 24 hour  Intake 2408.33 ml  Output   1250 ml  Net 1158.33 ml    Exam:  General: Alert oriented African American female looking younger than stated age Cardiovascular: S1-S2 no murmur rub or gallop, no added sound Respiratory: Clinically clear Abdomen: Soft nontender nondistended no rebound Skin no rash Neuro motor grossly intact  Data Reviewed: Basic Metabolic Panel:  Lab 05/24/12 7829 05/23/12 2045  NA 134* 137  K 3.6 3.7  CL 98 100  CO2 27 27  GLUCOSE 102* 114*  BUN 7 8  CREATININE 0.75 0.79  CALCIUM 9.4 9.7  MG -- --  PHOS -- --   Liver Function Tests:  Lab 05/23/12 2045  AST 14  ALT 6  ALKPHOS 83  BILITOT 0.3  PROT 7.5  ALBUMIN 3.0*    Lab 05/23/12 2045  LIPASE 26  AMYLASE --   No results found for this basename: AMMONIA:5 in the last 168 hours CBC:  Lab 05/26/12 0445 05/24/12 0455 05/23/12 2045  WBC 5.9 6.5 6.7  NEUTROABS -- -- 6.0  HGB 7.8* 8.8* 9.2*  HCT 23.7* 26.3* 28.6*  MCV 74.3* 73.7* 73.9*  PLT 333 364 428*   Cardiac Enzymes: No results found for this basename: CKTOTAL:5,CKMB:5,CKMBINDEX:5,TROPONINI:5 in the last 168 hours BNP: No components found with this basename: POCBNP:5 CBG:  Lab 05/25/12 0734 05/24/12 0751  GLUCAP 75 82    No  results found for this or any previous visit (from the past 240 hour(s)).   Studies:              All Imaging reviewed and is as per above notation   Scheduled Meds:    . sodium chloride   Intravenous Once  . acetaminophen  650 mg Oral Once  . docusate sodium  100 mg Oral BID  . heparin  5,000 Units Subcutaneous Q8H  . levothyroxine  150 mcg Oral Daily  . LORazepam  0.5 mg Oral Daily  . megestrol  200 mg Oral Daily  . metoCLOPramide  10 mg Oral QID  . morphine  60 mg Oral Q12H  . pantoprazole  40 mg Oral Daily  . sodium chloride  3 mL Intravenous Q12H  . venlafaxine XR  150 mg Oral Daily  . DISCONTD: fluconazole  100 mg Oral Daily  . DISCONTD: morphine  15 mg Oral Q12H  . DISCONTD: piperacillin-tazobactam (ZOSYN)  IV  3.375 g Intravenous Q8H  . DISCONTD: vancomycin  1,000 mg Intravenous Q8H  . DISCONTD: vancomycin  1,000 mg Intravenous Q12H   Continuous Infusions:    . sodium chloride 100 mL/hr at 05/26/12 0458     Assessment/Plan: 1. Stage IV  breast cancer with metastasis-patient poor candidate for third line chemotherapy-oncologist has requested palliative medicine to offer choice of hospice. Discussions ongoing--I once again discussed with the patient and her mother in the room that per my discussion and per discussion with Dr. Darnelle Catalan 10/17, patient potentially can be on third line chemotherapy and this can be pursued as an outpatient-hospice has seen the patient and explained their role in her care. She states categorically she would like to be a full code and would also like to discuss further options, even though this might not be in keeping with that makes her "comfortable"-mother iterates that it is ultimately Kristin [name]'s opinion regarding whether she wants to pursue further chemotherapy. She will likely be able to be discharged tomorrow. 2. New acute nondisplaced pathological fracture of right innominate bone-discussed with Dr. Carola Frost of orthopedics 05/24/2012 options  and he recommended partial weight bearing with bed to chair transfers-she is a poor candidate for any acute intervention from a holistic standpoint-physical therapy and occupational therapy has seen the patient and determined that she will need the following-delivery of Complete hospice Package C: walker with wheels; 3n1BSC;Tub seat with back; Transfer bench; wheelchair was requested and Kristin [name] with [facility] and Kristin [name] [facility] were aware - Kristin [name], patient's mother, is the contact to arrange delivery @ c: [phone] 3. Unlikely pneumonia-patient was on broad-spectrum antibiotics on admission-CT scan confirms lymphangitic spread of the cancer-she does not need antibiotics 4. Microcytic anemia-repeat CBC 10/18 p.m.-she is on IV fluids and this may be dilutional however if her hemoglobin drops below 7, she should be transfused 2 units packed red blood cells prior to discharge which I will arrange. 5. Thyroid disease-continue Synthroid 150 mcg daily [known risk for osteoporosis] 6. Depression-continue Effexor 150 mg every 24 hourly, lorazepam ly 7. Acute pain from multiple fractures-continue morphine long-acting 60 mg every 12 hour, Dilaudid increased 1-2 mg every 2 hours when necessary orally-further titration may occur tomorrow morning 8. Constipation continue Colace 100 mg twice a day-received enema today and may need further escalation of the same given increasing pain medications  Code Status: Full Family Communication: Discussed in detail with family  Disposition Plan: Potential discharge in the morning   Pleas Koch, MD  [facility] Pager [phone] 05/26/2012, 10:10 AM    LOS: 3 days

## 2012-05-26 NOTE — Progress Notes (Signed)
Home Hospice choice offered to pt and family. They chose [facility].

## 2012-05-26 NOTE — Progress Notes (Signed)
[facility] Liaison: Received request from PMT NP Jannette Fogo for follow up with patient and her mother for EDUCATION ONLY. Chart reviewed, met with Kristin [name] and mother at bedside. Answered questions,  provided explanation of services and offered support. Both expressed appreciation for visit and information. They see [employer] as potential option if family unable to manage needs at home. Encouraged family to tour [facility] as it is two blocks from their home. They have my contact information specific to [facility]. CSW aware. Thank You. Forrestine Him, LCSW [phone]

## 2012-05-26 NOTE — Progress Notes (Signed)
CSW met with patient's mother. Discussed disposition. They will be taking patient home. Mother requested a letter stating that patient could not get to and from the bank and that patient has cancer. CSW explained that CSW can write a letter stating that patient is in the hospital but can not write a diagnosis and physical capabilities. CSW provided same to patient's mother. Patient's sister then became irate because she wanted a copy to. CSW stated that patient's mother has one and she can make a copy as she see's fit but that CSW will not give these letters to other members of the family. Sister became very agitated and started stomping down the hall and getting angry. CSW attempted to calm sister down and direct her back to patient's room.  Grover [place] C. Leora Platt MSW, LCSW [phone]

## 2012-05-26 NOTE — Evaluation (Signed)
Physical Therapy Evaluation Patient Details Name: Kristin [name] MRN: [id number] DOB: 03/02/1966 Today's Date: 05/26/2012 Time: 1308-6578 PT Time Calculation (min): 30 min  PT Assessment / Plan / Recommendation Clinical Impression  46 yo female admitted with acute pathologic fracture of R pelvis. Hx of metastatic breast cancer. Plan is for pt to return home with family and hospice assistance. On eval, pt was +2 assist for safety. Pt was able to perform scoot pivot transfer and stand pivot transfer (with RW) to wheelchair. Pt did require assistance to keep R LE supported to limit WBing. Recommend HHPT for family education in home environment and 24 hour supervision/assist. Family present at end of eval. Instructed family to have nursing assist pt back to bed when ready.     PT Assessment  Patient needs continued PT services    Follow Up Recommendations  Home health PT;Supervision/Assistance - 24 hour    Does the patient have the potential to tolerate intense rehabilitation      Barriers to Discharge        Equipment Recommendations  Rolling walker with 5" wheels;3 in 1 bedside comode;Wheelchair cushion (measurements);Wheelchair (measurements);Hospital bed    Recommendations for Other Services OT consult   Frequency Min 3X/week    Precautions / Restrictions Precautions Precautions: Fall Restrictions Weight Bearing Restrictions: Yes RLE Weight Bearing: Touchdown weight bearing Other Position/Activity Restrictions: mets to R scapula   Pertinent Vitals/Pain R> L LE-unrated. Pt stated pain was tolerable.       Mobility  Bed Mobility Bed Mobility: Supine to Sit Supine to Sit: 6: Modified independent (Device/Increase time) Details for Bed Mobility Assistance: Increased time. Pt preferred to perform task unassisted. Used UEs to move LEs off EOB.  Transfers Transfers: Sit to Stand;Stand to [employer];Lateral/Scoot Transfers Sit to Stand: 1: +2 Total assist Sit to  Stand: Patient Percentage: 80% Stand to Sit: 1: +2 Total assist Stand to Sit: Patient Percentage: 80% Stand Pivot Transfers: 1: +2 Total assist Stand Pivot Transfers: Patient Percentage: 80% Lateral/Scoot Transfers: 4: Min assist;With armrests removed Details for Transfer Assistance: Performed scoot transfer (towards L side) bed>WC with assist only to support/monitor WBing R LE. Increased time. Sit<>stand x 2; stand-pivot x 2 with RW (BSC<>WC). Multimodal cues for safety, technique, hand placement, adherence to WB status. Assist to rise, stabilize, control descent, support/monitor WB R LE Ambulation/Gait Ambulation/Gait Assistance: Not tested (comment) Ambulation/Gait Assistance Details: Bed<>chair only per ortho    Shoulder Instructions     Exercises     PT Diagnosis: Difficulty walking;Acute pain;Generalized weakness  PT Problem List: Decreased strength;Decreased balance;Decreased mobility;Decreased knowledge of use of DME;Decreased knowledge of precautions;Decreased activity tolerance;Pain PT Treatment Interventions: DME instruction;Functional mobility training;Therapeutic activities;Therapeutic exercise;Balance training;Patient/family education;Wheelchair mobility training   PT Goals Acute Rehab PT Goals PT Goal Formulation: With patient Time For Goal Achievement: 06/02/12 Potential to Achieve Goals: Fair Pt will go Sit to Stand: with min assist PT Goal: Sit to Stand - Progress: Goal set today Pt will go Stand to Sit: with min assist PT Goal: Stand to Sit - Progress: Goal set today Pt will Transfer Bed to Chair/Chair to Bed: with min assist PT Transfer Goal: Bed to Chair/Chair to Bed - Progress: Goal set today  Visit Information  Last PT Received On: 05/26/12 Assistance Needed: +2 PT/OT Co-Evaluation/Treatment: Yes    Subjective Data  Subjective: "That was easy" Patient Stated Goal: Home   Prior Functioning  Home Living Lives With: Family Available Help at Discharge:  Family Type of Home: House Home  Access: Stairs to enter [employer] of Steps: 2 Home Layout: One level Home Adaptive Equipment: Straight cane Communication Communication: No difficulties Dominant Hand: Right    Cognition  Overall Cognitive Status: Impaired Area of Impairment: Problem solving;Safety/judgement Arousal/Alertness: Awake/alert Orientation Level: Appears intact for tasks assessed Behavior During Session: [facility] for tasks performed Safety/Judgement - Other Comments: Pt made comment that she was going to join gym. Poor insight into condition at times Cognition - Other Comments: Some difficulty processing weightbearing restrictions    Extremity/Trunk Assessment Right Upper Extremity Assessment RUE ROM/Strength/Tone: Deficits RUE ROM/Strength/Tone Deficits: mets in scapula and painful.  Able to feed self and assist with ADLs Left Upper Extremity Assessment LUE ROM/Strength/Tone: Within functional levels Right Lower Extremity Assessment RLE ROM/Strength/Tone: Deficits;Unable to fully assess;Due to precautions RLE ROM/Strength/Tone Deficits: Did not manually resist due to mets, hx of pathologic fractrues. Pt unable to extend lower leg against gravity. Hip flex 2/5, knee ext 2/5.  Left Lower Extremity Assessment LLE ROM/Strength/Tone: Deficits LLE ROM/Strength/Tone Deficits: Strength at least 3+/5. Trunk Assessment Trunk Assessment: Normal   Balance Balance Balance Assessed: Yes Static Sitting Balance Static Sitting - Balance Support: Feet supported Static Sitting - Level of Assistance: 6: Modified independent (Device/Increase time) Static Standing Balance Static Standing - Balance Support: Bilateral upper extremity supported Static Standing - Level of Assistance: 4: Min assist  End of Session PT - End of Session Activity Tolerance: Patient tolerated treatment well Patient left: in chair;with call bell/phone within reach;with family/visitor present Nurse  Communication: Mobility status;Precautions;Weight bearing status  GP     [name] [facility] 05/26/2012, 3:17 PM [phone]

## 2012-05-26 NOTE — Evaluation (Signed)
Occupational Therapy Evaluation Patient Details Name: Kristin [name] MRN: [id number] DOB: 1965-10-28 Today's Date: 05/26/2012 Time: 4098-1191 OT Time Calculation (min): 30 min  OT Assessment / Plan / Recommendation Clinical Impression  This 46 year old female was admitted with breast cancer that has metastasized to several sites.  She is TDWB on RLE.  She would benefit from continued OT to increase safety with transfer to 3:1 and ADLs.  We will follow her in acute care.    OT Assessment  Patient needs continued OT Services    Follow Up Recommendations  No OT follow up    Barriers to Discharge      Equipment Recommendations  3 in 1 bedside comode;Hospital bed;Rolling walker with 5" wheels;Wheelchair (measurements)    Recommendations for Other Services    Frequency  Min 2X/week    Precautions / Restrictions Restrictions Weight Bearing Restrictions: Yes RLE Weight Bearing: Touchdown weight bearing Other Position/Activity Restrictions: pt also has mets to R scapula   Pertinent Vitals/Pain Pain in R hip and R shoulder--moderate.  Premedicated    ADL  Grooming: Performed;Wash/dry hands;Set up Where Assessed - Grooming: Supported sitting Upper Body Bathing: Simulated;Set up Where Assessed - Upper Body Bathing: Unsupported sitting Upper Body Dressing: Minimal assistance;Performed (line) Where Assessed - Upper Body Dressing: Supported sitting Toilet Transfer: Performed;+2 Total assistance Toilet Transfer: Patient Percentage: 80% Toilet Transfer Method: Stand pivot (with RW to 3:1 commode) Acupuncturist: Materials engineer and Hygiene: Performed;+2 Total assistance Toileting - Architect and Hygiene: Patient Percentage: 90% Where Assessed - Engineer, mining and Hygiene: Standing Equipment Used: Rolling walker Transfers/Ambulation Related to ADLs: Performed lateral scoot to w/c:  total A x 2 pt 90% then SPT with  RW to 3:1 and back to w/c.  +2 to ensure TDWB on R ADL Comments: Family present and available to help pt.  Did not review AE as MD came during this session.  Will ask about this next visit    OT Diagnosis: Generalized weakness  OT Problem List: Decreased activity tolerance;Decreased knowledge of use of DME or AE;Decreased knowledge of precautions;Pain OT Treatment Interventions: Self-care/ADL training;DME and/or AE instruction;Patient/family education   OT Goals Acute Rehab OT Goals OT Goal Formulation: With patient Time For Goal Achievement: 06/09/12 Potential to Achieve Goals: Good ADL Goals Pt Will Transfer to Toilet: Stand pivot transfer;with min assist;3-in-1 (min guard) ADL Goal: Toilet Transfer - Progress: Goal set today Pt Will Perform Toileting - Clothing Manipulation: with min assist;Standing (min guard) ADL Goal: Toileting - Clothing Manipulation - Progress: Goal set today Miscellaneous OT Goals Miscellaneous OT Goal #1: Pt will verbalize use of vs. demonstrate use of reacher for adls  OT Goal: Miscellaneous Goal #1 - Progress: Goal set today  Visit Information  Last OT Received On: 05/26/12 Assistance Needed: +2 (due to tdwb on R, for safety)    Subjective Data  Subjective: I used a bedpan earlier Patient Stated Goal: get R LE better   Prior Functioning     Home Living Lives With: Family Available Help at Discharge: Family Communication Communication: No difficulties Dominant Hand: Right         Vision/Perception     Cognition  Overall Cognitive Status: Appears within functional limits for tasks assessed/performed Arousal/Alertness: Awake/alert Orientation Level: Appears intact for tasks assessed Behavior During Session: [facility] for tasks performed Cognition - Other Comments: cues for tdwb--new for pt.  Family member did ask pt if she would remember everything.  We reviewed TDWB with  her.      Extremity/Trunk Assessment Right Upper Extremity  Assessment RUE ROM/Strength/Tone: Deficits RUE ROM/Strength/Tone Deficits: mets in scapula and painful.  Able to feed self and assist with ADLs Left Upper Extremity Assessment LUE ROM/Strength/Tone: Within functional levels     Mobility Bed Mobility Bed Mobility: Supine to Sit Supine to Sit: 6: Modified independent (Device/Increase time);With rails;HOB elevated Transfers Transfers: Sit to Stand Sit to Stand: 1: +2 Total assist Sit to Stand: Patient Percentage: 80%     Shoulder Instructions     Exercise     Balance     End of Session OT - End of Session Activity Tolerance: Patient tolerated treatment well Patient left: with call bell/phone within reach;with family/visitor present (w/c) Nurse Communication: Mobility status  GO     Jorma Tassinari 05/26/2012, 2:49 PM Marica Otter, OTR/L [phone] 05/26/2012

## 2012-05-27 LAB — CBC
Hemoglobin: 7.9 g/dL — ABNORMAL LOW (ref 12.0–15.0)
MCH: 24.5 pg — ABNORMAL LOW (ref 26.0–34.0)
MCV: 73.3 fL — ABNORMAL LOW (ref 78.0–100.0)
Platelets: 340 10*3/uL (ref 150–400)
RBC: 3.22 MIL/uL — ABNORMAL LOW (ref 3.87–5.11)

## 2012-05-27 MED ORDER — OXYCODONE-ACETAMINOPHEN 5-325 MG PO TABS: 1.0000 | ORAL_TABLET | Freq: Three times a day (TID) | ORAL | Status: DC | PRN

## 2012-05-27 MED ORDER — LORAZEPAM 0.5 MG PO TABS: 0.5000 mg | ORAL_TABLET | Freq: Every day | ORAL | Status: DC

## 2012-05-27 MED ORDER — PROMETHAZINE HCL 25 MG PO TABS: 25.0000 mg | ORAL_TABLET | Freq: Four times a day (QID) | ORAL | Status: DC | PRN

## 2012-05-27 MED ORDER — BISACODYL 10 MG RE SUPP: 10.0000 mg | Freq: Every day | RECTAL | Status: DC | PRN

## 2012-05-27 MED ORDER — HYDROMORPHONE HCL 2 MG PO TABS: 4.0000 mg | ORAL_TABLET | ORAL | Status: DC | PRN

## 2012-05-27 MED ORDER — MORPHINE SULFATE ER 15 MG PO TBCR: 15.0000 mg | EXTENDED_RELEASE_TABLET | Freq: Two times a day (BID) | ORAL | Status: DC

## 2012-05-27 NOTE — Discharge Summary (Signed)
Physician Discharge Summary  Deundra Furber MRN: [id number] DOB/AGE: 1966-02-18 46 y.o.  PCP: [name], MD   Admit date: 05/23/2012 Discharge date: 05/27/2012  Discharge Diagnoses:     Breast cancer metastasized to multiple sites  Uncontrolled pain  HCAP (healthcare-associated pneumonia)  Pelvic fracture  Pain, joint, hip, right  Pain in right shoulder  Nausea  Constipation     Medication List     As of 05/27/2012 11:48 AM    STOP taking these medications         fluconazole 100 MG tablet   Commonly known as: DIFLUCAN      TAKE these medications         anastrozole 1 MG tablet   Commonly known as: ARIMIDEX   Take 1 tablet (1 mg total) by mouth daily.      bisacodyl 10 MG suppository   Commonly known as: DULCOLAX   Place 1 suppository (10 mg total) rectally daily as needed.      diphenhydrAMINE 25 MG tablet   Commonly known as: BENADRYL   Take 1 tablet (25 mg total) by mouth every 6 (six) hours as needed for itching, allergies or sleep. For anxiety/sleep.      DSS 100 MG Caps   Take 100 mg by mouth 2 (two) times daily.      HYDROmorphone 2 MG tablet   Commonly known as: DILAUDID   Take 2 tablets (4 mg total) by mouth every 4 (four) hours as needed for pain. For pain      levothyroxine 150 MCG tablet   Commonly known as: SYNTHROID, LEVOTHROID   Take 150 mcg by mouth daily.      LORazepam 0.5 MG tablet   Commonly known as: ATIVAN   Take 1 tablet (0.5 mg total) by mouth daily. For anxiety      megestrol 40 MG/ML suspension   Commonly known as: MEGACE   Take 5 mLs (200 mg total) by mouth daily.      metoCLOPramide 10 MG tablet   Commonly known as: REGLAN   Take 10 mg by mouth 4 (four) times daily.      morphine 15 MG 12 hr tablet   Commonly known as: MS CONTIN   Take 1 tablet (15 mg total) by mouth every 12 (twelve) hours.      omeprazole 20 MG capsule   Commonly known as: PRILOSEC   Take 20 mg by mouth daily.      ondansetron  4 MG tablet   Commonly known as: ZOFRAN   Take 1 tablet (4 mg total) by mouth every 6 (six) hours as needed for nausea.      oxyCODONE-acetaminophen 5-325 MG per tablet   Commonly known as: PERCOCET/ROXICET   Take 1-2 tablets by mouth every 8 (eight) hours as needed for pain. For pain.      PRESCRIPTION MEDICATION   Inject into the vein every 28 (twenty-eight) days. Zometa once monthly.      promethazine 25 MG tablet   Commonly known as: PHENERGAN   Take 1 tablet (25 mg total) by mouth every 6 (six) hours as needed. Nausea      traMADol 50 MG tablet   Commonly known as: ULTRAM   Take 1 tablet (50 mg total) by mouth every 6 (six) hours as needed. For pain      venlafaxine XR 75 MG 24 hr capsule   Commonly known as: EFFEXOR-XR   Take 2 capsules (150 mg total) by mouth  daily.        Discharge Condition: Chronically ill  Disposition: 01-Home or Self Care   Consults: * #1 oncology #2 palliative care   Significant Diagnostic Studies: Dg Chest 2 View  05/23/2012  *RADIOLOGY REPORT*  Clinical Data: Altered mental status.  History of metastatic breast cancer.  CHEST - 2 VIEW  Comparison: Chest x-ray 10 of 11/2011.  Findings: The cardiac silhouette, mediastinal and hilar contours are stable.  Severe chronic lung changes are noted. Possible superimposed right upper lobe infiltrate.  No obvious destructive bone lesions.  IMPRESSION:  1.  Severe chronic lung changes. 2.  Possible superimposed right upper lobe infiltrate.   Original Report Authenticated By: P. Loralie Champagne, M.D.    Ct Chest W Contrast  05/24/2012  *RADIOLOGY REPORT*  Clinical Data: Carcinoma.  Evaluate for pneumonia versus lymphangitic spread of tumor.  CT CHEST WITH CONTRAST  Technique:  Multidetector CT imaging of the chest was performed following the standard protocol during bolus administration of intravenous contrast.  Contrast: 80mL OMNIPAQUE IOHEXOL 300 MG/ML  SOLN .  Comparison: chest radiograph, 05/23/2012.   Chest CT, 02/21/2012  Findings: At the thoracic inlet, there are supraclavicular, left apical left superior mediastinal lymph nodes.  Reference measurement of a right supraclavicular lymph node is 1 cm in short axis.  This is larger than the 7.7 mm measurement previously.  A left apical/superior mediastinal node has increased from 12 mm to 15 mm.  A left superior mediastinal lymph node below this is similar measuring just over 12 mm.  A prevascular node along the left margin of the aortic arch currently measures 11 mm where  it had measured 10 mm.  Soft tissues surrounds the right hilar and upper lobe bronchi and pulmonary vessels.  This has increased when compared the prior exam.  Its margins are regular making a measurements somewhat problematic, but on the image number 23 of series 5, it measures approximately 4 cm x 6.8 cm in size where had measured 3.1 cm x 4.4 cm in size.  Soft tissue component of this just above the right pulmonary artery currently measures 24 mm x 30 mm where had measured 25 mm x 22 mm.  There are now small bilateral pleural effusions.  There are areas of coarse reticular opacity with associated pleural thickening in the left apex most of which is stable.  A few small areas represent new coarse reticular opacity.  Coarse reticular nodular opacity in the right upper lobe has increased, extending along the interstitium.  There is some mild dependent subsegmental atelectasis in the lower lobes.  In the upper abdomen, a mixed density lesion arises from the far lateral margin of the lateral segment of the left lobe measuring 6.1 cm x 2.8 cm in size.  On the prior study this same area measured just over 3.3 cm in long axis.  There is a low density lesion arising from the medial margin of the posterior segment of the right lobe.  This was only a sub centimeter lesion previously, now measuring 3.1 cm in size.  There is an irregular ill-defined nodular soft tissue above the pancreatic tail between it  and the stomach that is likely metastatic adenopathy.  This has increased from prior study.  There is a destructive bone lesion of the right scapula involving the scapular neck and glenoid.  This was not completely imaged on the prior study, but where images are compatible, this has advanced.  Other areas of sclerotic bone metastatic disease are  essentially stable.  IMPRESSION: Worsening metastatic disease from breast carcinoma.  Findings include mediastinal and supraclavicular adenopathy, abnormal soft tissues surrounding hila, vertically on the right where this has significantly increased, as well as reticular nodular interstitial densities in the right upper lobe suggesting lymphangitic spread of carcinoma.  Bilateral pleural effusions have developed since the prior exam.  There are two liver lesions that have increased from the prior exam and there is increased presumed metastatic adenopathy in the left upper quadrant.  Bony metastatic disease is mostly stable although there is a new destructive lesion in the right scapula.   Original Report Authenticated By: Domenic Moras, M.D.    Dg Pelvis Portable  05/23/2012  *RADIOLOGY REPORT*  Clinical Data: Bilateral hip pain.  No injury.  PORTABLE PELVIS  Comparison: Abdomen 05/12/2012  Findings: Heterogeneous areas of lucency and sclerosis in the right superior acetabular region and in the symphysis pubis bilaterally. Stable appearance of pathologic fractures of the right symphysis pubis region since the previous abdominal film.  There is new cortical lucency extending along the right innominate bone to the base of the superior right pubic ramus suggesting new pathologic fracture.  Heterogeneous sclerosis in the left femoral head consistent with metastasis.  SI joints and symphysis pubis are not displaced.  IMPRESSION: Changes consistent with known bony metastasis.  Apparent acute nondisplaced pathologic fracture of the right innominate bone. Stable appearance of  previously demonstrated pathologic fractures in the symphysis pubis.   Original Report Authenticated By: Marlon Pel, M.D.    Dg Abd Acute W/chest  05/12/2012  *RADIOLOGY REPORT*  Clinical Data: Abdominal pain, breast carcinoma  ACUTE ABDOMEN SERIES (ABDOMEN 2 VIEW & CHEST 1 VIEW)  Comparison: 04/23/2012 and earlier studies  Findings: Asymmetric pleuroparenchymal thickening at the left lung apex.  Coarse perihilar interstitial opacities.  Linear scarring/atelectasis at the left lung base as before.  Vascular clips in the left breast.  Heart size normal.  No effusion.  No free air.  Small bowel decompressed.  Normal distribution of gas and stool throughout colon.  Left pelvic phleboliths.  Sclerotic osseous lesions in the lower lumbar spine, pelvis, and left femoral neck are again noted.  There is a right pubic fracture which was not clearly evident on the prior scan.  IMPRESSION:  1.  New pathologic fracture of the right pubic bone. 2.  Normal bowel gas pattern. 3.  No free air. 4.  Persistent   left apical pleuroparenchymal disease with worsening perihilar interstitial opacities.   Original Report Authenticated By: Osa Craver, M.D.       Microbiology: No results found for this or any previous visit (from the past 240 hour(s)).   Labs: Results for orders placed during the hospital encounter of 05/23/12 (from the past 48 hour(s))  CBC     Status: Abnormal   Collection Time   05/26/12  4:45 AM      Component Value Range Comment   WBC 5.9  4.0 - 10.5 K/uL    RBC 3.19 (*) 3.87 - 5.11 MIL/uL    Hemoglobin 7.8 (*) 12.0 - 15.0 g/dL    HCT 78.4 (*) 69.6 - 46.0 %    MCV 74.3 (*) 78.0 - 100.0 fL    MCH 24.5 (*) 26.0 - 34.0 pg    MCHC 32.9  30.0 - 36.0 g/dL    RDW 29.5 (*) 28.4 - 15.5 %    Platelets 333  150 - 400 K/uL   GLUCOSE, CAPILLARY  Status: Abnormal   Collection Time   05/26/12 11:45 AM      Component Value Range Comment   Glucose-Capillary 126 (*) 70 - 99 mg/dL   CBC      Status: Abnormal   Collection Time   05/26/12  7:50 PM      Component Value Range Comment   WBC 6.3  4.0 - 10.5 K/uL    RBC 3.42 (*) 3.87 - 5.11 MIL/uL    Hemoglobin 8.4 (*) 12.0 - 15.0 g/dL    HCT 40.9 (*) 81.1 - 46.0 %    MCV 73.1 (*) 78.0 - 100.0 fL    MCH 24.6 (*) 26.0 - 34.0 pg    MCHC 33.6  30.0 - 36.0 g/dL    RDW 91.4 (*) 78.2 - 15.5 %    Platelets 335  150 - 400 K/uL   TYPE AND SCREEN     Status: Normal (Preliminary result)   Collection Time   05/26/12  7:50 PM      Component Value Range Comment   ABO/RH(D) O POS      Antibody Screen NEG      Sample Expiration 05/29/2012      Unit Number [id number]      Blood Component Type RED CELLS,LR      Unit division 00      Status of Unit ALLOCATED      Transfusion Status OK TO TRANSFUSE      Crossmatch Result Compatible      Unit Number [id number]      Blood Component Type RED CELLS,LR      Unit division 00      Status of Unit ALLOCATED      Transfusion Status OK TO TRANSFUSE      Crossmatch Result Compatible     PREPARE RBC (CROSSMATCH)     Status: Normal   Collection Time   05/26/12  7:51 PM      Component Value Range Comment   Order Confirmation ORDER PROCESSED BY BLOOD BANK     SAMPLE TO BLOOD BANK     Status: Normal   Collection Time   05/26/12  8:00 PM      Component Value Range Comment   Blood Bank Specimen SAMPLE AVAILABLE FOR TESTING      Sample Expiration 05/29/2012     ABO/RH     Status: Normal   Collection Time   05/26/12  9:00 PM      Component Value Range Comment   ABO/RH(D) O POS     CBC     Status: Abnormal   Collection Time   05/27/12  5:27 AM      Component Value Range Comment   WBC 5.8  4.0 - 10.5 K/uL    RBC 3.22 (*) 3.87 - 5.11 MIL/uL    Hemoglobin 7.9 (*) 12.0 - 15.0 g/dL    HCT 40.1 (*) 02.7 - 46.0 %    MCV 73.3 (*) 78.0 - 100.0 fL    MCH 24.5 (*) 26.0 - 34.0 pg    MCHC 33.5  30.0 - 36.0 g/dL    RDW 25.3 (*) 66.4 - 15.5 %    Platelets 340  150 - 400 K/uL   GLUCOSE, CAPILLARY      Status: Normal   Collection Time   05/27/12  7:41 AM      Component Value Range Comment   Glucose-Capillary 81  70 - 99 mg/dL    Comment 1 Notify RN  Brief narrative:  46 yr old with Stage 4 lung cancer admitted to [facility] hospital with Pneumonic type symptoms, new acute pelvic fracture. Patient had a CT scan of the chest 05/24/2012 which did not concur with pneumonia diagnosis but instead suggested lymphangitic spread of her primary breast cancer. Discussions were undertaken by oncologist, hospitalists and palliative medicine regarding best course of care and ultimately it was decided by patient that she would like to explore options for third line chemotherapy as an outpatient with Dr. Angela Burke.  Orthopedics was informally consulted and recommended partial weightbearing on the fracture with pain management- recommended if her prognosis was not so poor to have a musculoskeletal orthopedist review her however given her likely poor outcomes, and this was not pursued-physical therapy occupational therapy worked with the patient and recommended home health PT for family education in home environment-they noted that she was 2+ assist for safety and noted that she was able to perform school pivot transfer and stand to transfer with rolling walker wheelchair  Palliative medicine was involved in delineating the goals of care and pain management-hospice was explained to the patient by hospice nurse and coordinator.  It was noted on 10/18 that she was slightly anemic, IV fluids were discontinued and that will be rechecked this evening. If her hemoglobin remains above 7, patient can potentially be discharged on the morning of 10/19    Past medical history-As per Problem list  Chart Review:  Admission 09/15/2009-Found to have breast cancer on this admission,February 8th 2012 L breast biopsy showed a high-grade tumor which was ER 100% receptor positive but Her-2 and progesterone receptor negative. The  proliferation fraction was elevated at 33%. bone biopsy on February 11th and her T12 vertebral body indeed was found to harbor an estrogen receptor positive, progesterone receptor negative carcinoma consistent with her breast primary.  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 Grade 2, negative sentinel lymph node.  Admission 11/04/2009-nausea and vomiting, Pathological fractures  Admission 04/23/12 for Acute pancreatitis with unknown etiology Dr. Darrall Dears synopssi of patient's cancer h/o  46 y.o. [place] woman with history of breast cancer stage IV at diagnosis February 2011, with metastases to bone and possibly developing spread to lymph nodes, lungs and liver  (1) status post 30 Gy to T-3 through T-18 October 2009  (2) status post docetaxel, cyclophosphamide and doxorubicin x6, completed in June 2011.  (3) status post Left lumpectomy and sentinel lymph node biopsy August 2011 for a 2-mm focus of residual invasive ductal carcinoma, grade 2, estrogen receptor 100% positive, progesterone receptor and HER-2 negative, with an Mib-1 of 33%, negative sentinel lymph node  (4) tamoxifen started July 2011, with evidence of progression March 2013  (5) status post radiation to Left breast completed November 2011  (6) s/p radiation to the lower spine, completed 11/15/2011  (7) fulvestrant/goserelin started 11/03/2011, with evidence of progression in September 2013  (8) status post palliative radiation to the right hip in September 2013  (9) Continuing goserelin and adding anastrazole, beginning 05/09/2012  (10) continuing on zolendronic acid monthly    Consultants:  Palliative medicine  Oncology Procedures:  CT chest showing widespread metastatic disease  CXR=?PNa  Hip x-rays Antibiotics:  Vancomycin/zosyn/fluconazole 10/16-->10/17 Subjective   Awake, still coughing, has sputum, no SOB/CP blurred  vision double vision-states that she's and 7 on 10 pain "all over"  When I ask her specifically what is causing most  of her pain, she states that it is her upper right hip and her stomach.  She believes that she has pneumonia      HOSPITAL COURSE:  Assessment/Plan:  1. Stage IV breast cancer with metastasis-patient poor candidate for third line chemotherapy-oncologist has requested palliative medicine to offer choice of hospice. Discussions ongoing--extensively discussed with the patient and her mother in the room that per my discussion and per discussion with Dr. Darnelle Catalan 10/17, patient potentially can be on third line chemotherapy and this can be pursued as an outpatient-hospice has seen the patient and explained their role in her care. She states categorically she would like to be a full code and would also like to discuss further options, even though this might not be in keeping with that makes her "comfortable"-mother iterates that it is ultimately Ms. Vankuren's opinion regarding whether she wants to pursue further chemotherapy. She will likely be able to be discharged tomorrow. 2. New acute nondisplaced pathological fracture of right innominate bone-discussed with Dr. Carola Frost of orthopedics 05/24/2012 options and he recommended partial weight bearing with bed to chair transfers-she is a poor candidate for any acute intervention from a holistic standpoint-physical therapy and occupational therapy has seen the patient and determined that she will need the following-delivery of Complete hospice Package C: walker with wheels; [id number];Tub seat with back; Transfer bench; wheelchair was requested and Lecretia with [facility] and [facility] were aware - Jolena Heeney, patient's mother, is the contact to arrange delivery @ c: [phone] 3. Unlikely pneumonia-patient was on broad-spectrum antibiotics on admission-CT scan confirms lymphangitic spread of the cancer-she does not need antibiotics 4. Microcytic  anemia-repeat CBC 7.9 no transfusions given,.-she is on IV fluids and this may be dilutional , oncology to transfuse as needed outpatient 5. Thyroid disease-continue Synthroid 150 mcg daily [known risk for osteoporosis] 6. Depression-continue Effexor 150 mg every 24 hourly, lorazepam ly 7. Acute pain from multiple fractures-continue morphine long-acting 60 mg every 12 hour, Dilaudid increased 1-2 mg every 2 hours when necessary orally 8. Constipation continue Colace 100 mg twice a day-received enema today and may need further escalation of the same given increasing pain medications         Discharge Exam:  Blood pressure 155/76, pulse 100, temperature 97.8 F (36.6 C), temperature source Oral, resp. rate 20, height 5\' 9"  (1.753 m), weight 70.308 kg (155 lb), SpO2 95.00%.  General: Alert oriented African American female looking younger than stated age  Cardiovascular: S1-S2 no murmur rub or gallop, no added sound  Respiratory: Clinically clear  Abdomen: Soft nontender nondistended no rebound  Skin no rash  Neuro motor grossly intact        Discharge Orders    Future Appointments: Provider: Department: Dept Phone: Center:   05/30/2012 11:40 AM Maryln Gottron, MD [facility] [phone] None   06/06/2012 1:30 PM Beverely Pace Shumate [facility] [phone] None   06/06/2012 1:45 PM Amy Allegra Grana, PA [facility] [phone] None   06/06/2012 2:30 PM [facility] [phone] None   06/27/2012 11:30 AM Dava Najjar Idelle Jo [facility] [phone] None   06/27/2012 12:00 PM [name], MD [facility] [phone] None   07/04/2012 1:30 PM Beverely Pace Shumate [facility] [phone] None   07/04/2012 2:00 PM [facility] [phone] None   07/25/2012 11:00 AM [facility] Hummingbird [facility] [phone] None   08/01/2012 11:15 AM Dava Najjar Idelle Jo [facility] [phone] None   08/01/2012 11:45  AM [facility] [phone] None   08/29/2012 11:00 AM Windell Hummingbird [facility] [phone] None   09/26/2012 11:00 AM Windell Hummingbird [facility] [phone] None      Follow-up Information    Follow up with [facility]. ([facility] to follow aftr d/c - Staff RN Pls FAX completed d/c summary to [phone]; pls notify whn ambulance transport on unit & pt ready to leave call [phone] )    Contact information:   [facility] [address] [place], [place] [phone]         Signed: Richarda Overlie 05/27/2012, 11:48 AM

## 2012-05-27 NOTE — Progress Notes (Signed)
CSW assited with EMS transportation arrangements per Unit request.  CSW provided completed transport form for nurse and gathered transport packet for EMS.   Nurse to call for transport when Pt is ready for d/c.   No further CSW needs identified.  Leron Croak, LCSWA [employer] [phone]

## 2012-05-27 NOTE — Progress Notes (Signed)
Occupational Therapy Treatment Patient Details Name: Kristin [name] MRN: [id number] DOB: [birth year] Today's Date: 05/27/2012 Time: 7829-5621 OT Time Calculation (min): 15 min  OT Assessment / Plan / Recommendation Comments on Treatment Session Pt discharging home via ambulance today. Pt will have all necessary equip including BSC, w/c,  tub transfer bench.    Follow Up Recommendations  No OT follow up    Barriers to Discharge       Equipment Recommendations  Rolling walker with 5" wheels;Wheelchair (measurements);Wheelchair cushion (measurements);3 in 1 bedside comode;Hospital bed    Recommendations for Other Services    Frequency Min 2X/week   Plan Discharge plan remains appropriate    Precautions / Restrictions Precautions Precautions: Fall Restrictions Weight Bearing Restrictions: Yes RLE Weight Bearing: Touchdown weight bearing Other Position/Activity Restrictions: mets to R scapula   Pertinent Vitals/Pain Pt reported discomfort in R hip with mobility. Repositioned in bed and RN made aware.    ADL  Lower Body Dressing: Performed;Minimal assistance Where Assessed - Lower Body Dressing: Supported sit to stand Toilet Transfer: Performed;Minimal assistance Toilet Transfer Method: Surveyor, minerals: Materials engineer and Hygiene: Performed;Minimal assistance Where Assessed - Engineer, mining and Hygiene: Sit to stand from 3-in-1 or toilet Transfers/Ambulation Related to ADLs: Pt completed SPT to [facility]. Did well maintaining PWB staus. ADL Comments: Pt declined ed on AE stating family would be available to help her. Pt required A to thread LEs into pant legs but was able to pull up around hips. Pt stated that her w/c would fit into the bathroom. Verbally educated pt on tub transfer bench.    OT Diagnosis:    OT Problem List:   OT Treatment Interventions:     OT Goals ADL Goals ADL Goal: Toilet Transfer -  Progress: Progressing toward goals ADL Goal: Toileting - Clothing Manipulation - Progress: Progressing toward goals Miscellaneous OT Goals OT Goal: Miscellaneous Goal #1 - Progress: Discontinued (comment) (Pt stated family would assist.)  Visit Information  Last OT Received On: 05/27/12 Assistance Needed: +1    Subjective Data  Subjective: I'm leaving in a few minutes.   Prior Functioning       Cognition  Overall Cognitive Status: Appears within functional limits for tasks assessed/performed Arousal/Alertness: Awake/alert Orientation Level: Appears intact for tasks assessed Behavior During Session: [facility] for tasks performed Cognition - Other Comments: Pt was slow to process instructions regarding WB status.    Mobility  Shoulder Instructions Bed Mobility Bed Mobility: Sit to Supine Supine to Sit: 6: Modified independent (Device/Increase time);With rails Sit to Supine: 5: Supervision;HOB flat;With rail Details for Bed Mobility Assistance: Increased time Transfers Sit to Stand: 4: Min assist;With upper extremity assist;From bed;From chair/3-in-1 Stand to Sit: 4: Min assist;With upper extremity assist;To chair/3-in-1;To bed;With armrests Details for Transfer Assistance: Min A to rise an stabilize. Pt was able to  maintain PWB status to RLE throughout. Min VCs for hand placement and technique when manipulating RW.       Exercises      Balance Static Standing Balance Static Standing - Balance Support: No upper extremity supported;During functional activity Static Standing - Level of Assistance: 4: Min assist   End of Session OT - End of Session Activity Tolerance: Patient tolerated treatment well Patient left: in bed;with call bell/phone within reach;with family/visitor present  GO     Kristin [name] A OTR/L [phone] 05/27/2012, 2:23 PM

## 2012-05-29 ENCOUNTER — Telehealth: Payer: Self-pay | Admitting: *Deleted

## 2012-05-29 NOTE — Telephone Encounter (Signed)
Received a message from Hospice regarding the patient appts for this week. I hve forwarded the message to the desk RN.  JMW

## 2012-05-30 ENCOUNTER — Telehealth: Payer: Self-pay | Admitting: *Deleted

## 2012-05-30 ENCOUNTER — Other Ambulatory Visit: Payer: Self-pay | Admitting: Lab

## 2012-05-30 ENCOUNTER — Ambulatory Visit: Payer: Self-pay

## 2012-05-30 ENCOUNTER — Ambulatory Visit: Payer: Self-pay | Admitting: Physician Assistant

## 2012-05-30 ENCOUNTER — Ambulatory Visit: Admission: RE | Admit: 2012-05-30 | Payer: Medicare Other | Source: Ambulatory Visit | Admitting: Radiation Oncology

## 2012-05-30 LAB — TYPE AND SCREEN

## 2012-05-31 ENCOUNTER — Telehealth: Payer: Self-pay | Admitting: *Deleted

## 2012-05-31 NOTE — Telephone Encounter (Signed)
Patient's mother called asking if Kristin [name] may smoke.  Report she has turned off the tank and has four extra tanks in the home.  Kristin [name] is in the bathroom.  Informed her that Kristin [name] should not smoke in the home with oxygen.  Asked what she has tried to stop smoking and mom doesn't know.  Continued to ask how the oxygen can catch fire and explained it's flammable and the sparks from cigarettes and the chemical can cause a fire, facial burns and possibly tanks can explode.  Suggested Kristin [name] go outside if she can't not smoke and instructed not to increase oxygen flow rate too excessively when oxygen is resumed.  Family notably yelling, trying to get her to stop smoking and ended the call.

## 2012-06-03 ENCOUNTER — Encounter: Payer: Self-pay | Admitting: Oncology

## 2012-06-05 ENCOUNTER — Other Ambulatory Visit: Payer: Self-pay | Admitting: *Deleted

## 2012-06-05 DIAGNOSIS — R197 Diarrhea, unspecified: Secondary | ICD-10-CM

## 2012-06-05 DIAGNOSIS — C50919 Malignant neoplasm of unspecified site of unspecified female breast: Secondary | ICD-10-CM

## 2012-06-06 ENCOUNTER — Telehealth: Payer: Self-pay | Admitting: *Deleted

## 2012-06-06 ENCOUNTER — Encounter: Payer: Self-pay | Admitting: Physician Assistant

## 2012-06-06 ENCOUNTER — Ambulatory Visit: Payer: Medicare Other

## 2012-06-06 ENCOUNTER — Other Ambulatory Visit (HOSPITAL_BASED_OUTPATIENT_CLINIC_OR_DEPARTMENT_OTHER): Payer: Medicare Other | Admitting: Lab

## 2012-06-06 ENCOUNTER — Telehealth: Payer: Self-pay | Admitting: Oncology

## 2012-06-06 ENCOUNTER — Ambulatory Visit (HOSPITAL_BASED_OUTPATIENT_CLINIC_OR_DEPARTMENT_OTHER): Payer: Medicare Other | Admitting: Physician Assistant

## 2012-06-06 ENCOUNTER — Ambulatory Visit: Payer: Self-pay

## 2012-06-06 VITALS — BP 117/80 | HR 91 | Temp 97.8°F | Resp 20 | Ht 69.0 in | Wt 141.3 lb

## 2012-06-06 DIAGNOSIS — C50919 Malignant neoplasm of unspecified site of unspecified female breast: Secondary | ICD-10-CM

## 2012-06-06 DIAGNOSIS — C50419 Malignant neoplasm of upper-outer quadrant of unspecified female breast: Secondary | ICD-10-CM

## 2012-06-06 DIAGNOSIS — R197 Diarrhea, unspecified: Secondary | ICD-10-CM

## 2012-06-06 DIAGNOSIS — C787 Secondary malignant neoplasm of liver and intrahepatic bile duct: Secondary | ICD-10-CM

## 2012-06-06 DIAGNOSIS — C7951 Secondary malignant neoplasm of bone: Secondary | ICD-10-CM

## 2012-06-06 LAB — CBC WITH DIFFERENTIAL/PLATELET
BASO%: 0.1 % (ref 0.0–2.0)
Basophils Absolute: 0 10*3/uL (ref 0.0–0.1)
EOS%: 2.4 % (ref 0.0–7.0)
HCT: 29.3 % — ABNORMAL LOW (ref 34.8–46.6)
HGB: 9.5 g/dL — ABNORMAL LOW (ref 11.6–15.9)
LYMPH%: 11.4 % — ABNORMAL LOW (ref 14.0–49.7)
MCH: 23.7 pg — ABNORMAL LOW (ref 25.1–34.0)
MCHC: 32.4 g/dL (ref 31.5–36.0)
MCV: 73.1 fL — ABNORMAL LOW (ref 79.5–101.0)
MONO%: 12.7 % (ref 0.0–14.0)
NEUT%: 73.4 % (ref 38.4–76.8)
Platelets: 354 10*3/uL (ref 145–400)

## 2012-06-06 MED ORDER — CHOLESTYRAMINE 4 G PO PACK: PACK | ORAL | Status: DC

## 2012-06-06 MED ORDER — METRONIDAZOLE 500 MG PO TABS: 500.0000 mg | ORAL_TABLET | Freq: Three times a day (TID) | ORAL | Status: DC

## 2012-06-06 NOTE — Progress Notes (Signed)
ID: Kristin [name]   DOB: 12-Nov-1965  MR#: [id number]  WUJ#:[id number]  HISTORY OF PRESENT ILLNESS: Kristin [name] was originally seen in the hospital for an initial consult on September 17, 2009. At that time, she presented with back pain and was found to have a markedly abnormal left breast and axilla as well as lytic bone lesions. We obtained a biopsy of the left breast on February 8th which showed a high-grade tumor which was ER 100% receptor positive but Her-2 and progesterone receptor negative. The proliferation fraction was elevated at 33%. We then obtained a bone biopsy on February 11th and her T12 vertebral body indeed was found to harbor an estrogen receptor positive, progesterone receptor negative carcinoma consistent with her breast primary.  She had complete staging studies including a brain MRI with and without contrast which showed no evidence of metastatic disease to the brain, and a CT of the abdomen and pelvis which showed no liver lesions and no other lesions other than in the bones. There was a fibroid uterus incidentally noted. She had a CT of the chest previously (February 7th) which showed only some chronic appearing changes in the left upper lobe, consistent with her history of treated tuberculosis. Again, there were multiple lytic bone lesions.  Kristin [name] was treated in the neoadjuvant setting of docetaxel cyclophosphamide and doxorubicin x6, completed June 2011. She also received radiation therapy to T3 through T12.  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 Grade 2, negative sentinel lymph node. Patient was started on tamoxifen in July 2011, in addition to zoledronic acid which is been given monthly. Her subsequent treatment is as detailed below.  INTERVAL HISTORY: Kristin [name] returns today accompanied by her mother and sister for followup of her metastatic breast carcinoma.  Interval history is remarkable for recent hospitalizations.  Restaging scans during hospitalization showed evidence of further progression of Kristin [name]'s disease. She is currently being followed by hospice and palliative care [facility]. She has continued on anastrozole as prescribed in early October. She is tolerating the medication well. She continues to have hot flashes. Of course she has chronic pain, but this is not attributed to the aromatase inhibitor. She continues to receive Zoladex every 3 months, last given 04/04/2012.  She's been receiving zoledronic acid monthly, last given 05/09/2012.  Interval history, however, is also notable for Kristin [name] developing significant diarrhea over the last several days, with multiple episodes per day. She's had some occasional nausea with emesis. She's had some abdominal discomfort. She denies any blood in the stool. She's had no fevers, chills, or night sweats.  REVIEW OF SYSTEMS: Kristin [name] denies any fevers or chills.  Her appetite is reduced. She has no cough or phlegm production but does have shortness of breath and fatigue with exertion. She feels weak and tired. No chest pain or palpitations. No abnormal headaches or dizziness. No peripheral swelling.  Her pain is currently well controlled on her current pain regimen.  A detailed review of systems is otherwise stable and noncontributory.   PAST MEDICAL HISTORY: Past Medical History  Diagnosis Date  . Breast lump   . Thyroid disease   . Hypertension   . Hx of radiation therapy 06/2010, 10/2009, 11/2011    L breast, T3-T12 spine, L-S spine  . 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 Breast cancer 09/2009    L breast, ER+, PR-, Her 2 -  .  History of radiation therapy 10/27/11-11/15/11    lumbar/sacral spine/18MV photons    PAST SURGICAL HISTORY: Past Surgical History  Procedure Date  . Knee arthroscopy     left  . Breast surgery     left lumpectomy  . Knee cartilage surgery 2006    FAMILY  HISTORY Patient's mother is alive and in her mid 35s. She has a history of treated tuberculosis. The patient's father died from a drug overdose, and the patient does not know much about him. She has one brother with hypertension and one sister, Kristin [name]. There is no known history of breast or ovarian cancer in the family.   GYNECOLOGIC HISTORY: Gx P1, first pregnancy to term at age 31. Her son was premature.  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 She is currently unemployed. She has been living with her mother, Kristin [name], but now has her own place. The patient's son. Kristin [name], has graduated from high school and has applied for a job core position. He is still at home with the patient at least part-time. The patient is a Control and instrumentation engineer.    ADVANCED DIRECTIVES:  HEALTH MAINTENANCE: History  Substance Use Topics  . Smoking status: Former Smoker    Quit date: 08/25/2007  . Smokeless tobacco: Not on file  . Alcohol Use: No     Colonoscopy:  PAP:  Bone density:  Lipid panel:  No Known Allergies  Current Outpatient Prescriptions  Medication Sig Dispense Refill  . anastrozole (ARIMIDEX) 1 MG tablet Take 1 tablet (1 mg total) by mouth daily.  30 tablet  5  . bisacodyl (DULCOLAX) 10 MG suppository Place 1 suppository (10 mg total) rectally daily as needed.  30 suppository  0  . cholestyramine (QUESTRAN) 4 G packet 1 packet by mouth with food twice daily as needed for diarrhea  60 each  12  . diphenhydrAMINE (BENADRYL) 25 MG tablet Take 1 tablet (25 mg total) by mouth every 6 (six) hours as needed for itching, allergies or sleep. For anxiety/sleep.  60 tablet  0  . docusate sodium 100 MG CAPS Take 100 mg by mouth 2 (two) times daily.  45 capsule  0  . HYDROmorphone (DILAUDID) 2 MG tablet Take 2 tablets (4 mg total) by mouth every 4 (four) hours as needed for pain. For pain  45 tablet  0  . levothyroxine (SYNTHROID, LEVOTHROID) 150 MCG  tablet Take 150 mcg by mouth daily.      Marland Kitchen LORazepam (ATIVAN) 0.5 MG tablet Take 1 tablet (0.5 mg total) by mouth daily. For anxiety  45 tablet  0  . megestrol (MEGACE ORAL) 40 MG/ML suspension Take 5 mLs (200 mg total) by mouth daily.  240 mL  2  . metoCLOPramide (REGLAN) 10 MG tablet Take 10 mg by mouth 4 (four) times daily.       . metroNIDAZOLE (FLAGYL) 500 MG tablet Take 1 tablet (500 mg total) by mouth 3 (three) times daily.  30 tablet  0  . morphine (MS CONTIN) 15 MG 12 hr tablet Take 1 tablet (15 mg total) by mouth every 12 (twelve) hours.  45 tablet  0  . omeprazole (PRILOSEC) 20 MG capsule Take 20 mg by mouth daily.       . ondansetron (ZOFRAN) 4 MG tablet Take 1 tablet (4 mg total) by mouth every 6 (six) hours as needed for nausea.  45 tablet  0  . ondansetron (ZOFRAN-ODT) 4 MG disintegrating tablet       .  oxyCODONE-acetaminophen (PERCOCET/ROXICET) 5-325 MG per tablet Take 1-2 tablets by mouth every 8 (eight) hours as needed for pain. For pain.  30 tablet  0  . PRESCRIPTION MEDICATION Inject into the vein every 28 (twenty-eight) days. Zometa once monthly.      . promethazine (PHENERGAN) 25 MG tablet Take 1 tablet (25 mg total) by mouth every 6 (six) hours as needed. Nausea  30 tablet  0  . traMADol (ULTRAM) 50 MG tablet Take 1 tablet (50 mg total) by mouth every 6 (six) hours as needed. For pain  120 tablet  0  . venlafaxine XR (EFFEXOR-XR) 75 MG 24 hr capsule Take 2 capsules (150 mg total) by mouth daily.  30 capsule  12    OBJECTIVE: Middle-aged [place] American woman who appears uncomfortable and is examined in a wheelchair Filed Vitals:   06/06/12 1400  BP: 117/80  Pulse: 91  Temp: 97.8 F (36.6 C)  Resp: 20     Body mass index is 20.87 kg/(m^2).    ECOG FS: 2 Filed Weights   06/06/12 1400  Weight: 141 lb 4.8 oz (64.093 kg)   Physical Exam: HEENT:  Sclerae anicteric.  Oropharynx clear.   Breast Exam:  Deferred Lungs:  Clear to auscultation bilaterally.  No crackles,  rhonchi, or wheezes.   Heart:  Regular rate and rhythm.   Abdomen:  Soft, nontender.  Positive bowel sounds.   Extremities:  No peripheral edema.  Neuro:  Nonfocal, alert and oriented x3   LAB RESULTS: Lab Results  Component Value Date   WBC 7.6 06/06/2012   NEUTROABS 5.5 06/06/2012   HGB 9.5* 06/06/2012   HCT 29.3* 06/06/2012   MCV 73.1* 06/06/2012   PLT 354 06/06/2012      Chemistry      Component Value Date/Time   NA 134* 05/24/2012 0455   NA 136 05/09/2012 1402   K 3.6 05/24/2012 0455   K 4.5 05/09/2012 1402   CL 98 05/24/2012 0455   CL 102 05/09/2012 1402   CO2 27 05/24/2012 0455   CO2 26 05/09/2012 1402   BUN 7 05/24/2012 0455   BUN 18.0 05/09/2012 1402   CREATININE 0.75 05/24/2012 0455   CREATININE 0.9 05/09/2012 1402      Component Value Date/Time   CALCIUM 9.4 05/24/2012 0455   CALCIUM 11.1* 05/09/2012 1402   ALKPHOS 83 05/23/2012 2045   ALKPHOS 73 05/09/2012 1402   AST 14 05/23/2012 2045   AST 15 05/09/2012 1402   ALT 6 05/23/2012 2045   ALT 9 05/09/2012 1402   BILITOT 0.3 05/23/2012 2045   BILITOT 0.30 05/09/2012 1402       Lab Results  Component Value Date   LABCA2 336* 04/12/2012     STUDIES: Dg Chest 2 View  05/23/2012  *RADIOLOGY REPORT*  Clinical Data: Altered mental status.  History of metastatic breast cancer.  CHEST - 2 VIEW  Comparison: Chest x-ray 10 of 11/2011.  Findings: The cardiac silhouette, mediastinal and hilar contours are stable.  Severe chronic lung changes are noted. Possible superimposed right upper lobe infiltrate.  No obvious destructive bone lesions.  IMPRESSION:  1.  Severe chronic lung changes. 2.  Possible superimposed right upper lobe infiltrate.   Original Report Authenticated By: P. Loralie Champagne, M.D.    Ct Chest W Contrast  05/24/2012  *RADIOLOGY REPORT*  Clinical Data: Carcinoma.  Evaluate for pneumonia versus lymphangitic spread of tumor.  CT CHEST WITH CONTRAST  Technique:  Multidetector CT imaging of the chest  was  performed following the standard protocol during bolus administration of intravenous contrast.  Contrast: 80mL OMNIPAQUE IOHEXOL 300 MG/ML  SOLN .  Comparison: chest radiograph, 05/23/2012.  Chest CT, 02/21/2012  Findings: At the thoracic inlet, there are supraclavicular, left apical left superior mediastinal lymph nodes.  Reference measurement of a right supraclavicular lymph node is 1 cm in short axis.  This is larger than the 7.7 mm measurement previously.  A left apical/superior mediastinal node has increased from 12 mm to 15 mm.  A left superior mediastinal lymph node below this is similar measuring just over 12 mm.  A prevascular node along the left margin of the aortic arch currently measures 11 mm where  it had measured 10 mm.  Soft tissues surrounds the right hilar and upper lobe bronchi and pulmonary vessels.  This has increased when compared the prior exam.  Its margins are regular making a measurements somewhat problematic, but on the image number 23 of series 5, it measures approximately 4 cm x 6.8 cm in size where had measured 3.1 cm x 4.4 cm in size.  Soft tissue component of this just above the right pulmonary artery currently measures 24 mm x 30 mm where had measured 25 mm x 22 mm.  There are now small bilateral pleural effusions.  There are areas of coarse reticular opacity with associated pleural thickening in the left apex most of which is stable.  A few small areas represent new coarse reticular opacity.  Coarse reticular nodular opacity in the right upper lobe has increased, extending along the interstitium.  There is some mild dependent subsegmental atelectasis in the lower lobes.  In the upper abdomen, a mixed density lesion arises from the far lateral margin of the lateral segment of the left lobe measuring 6.1 cm x 2.8 cm in size.  On the prior study this same area measured just over 3.3 cm in long axis.  There is a low density lesion arising from the medial margin of the posterior segment  of the right lobe.  This was only a sub centimeter lesion previously, now measuring 3.1 cm in size.  There is an irregular ill-defined nodular soft tissue above the pancreatic tail between it and the stomach that is likely metastatic adenopathy.  This has increased from prior study.  There is a destructive bone lesion of the right scapula involving the scapular neck and glenoid.  This was not completely imaged on the prior study, but where images are compatible, this has advanced.  Other areas of sclerotic bone metastatic disease are essentially stable.  IMPRESSION: Worsening metastatic disease from breast carcinoma.  Findings include mediastinal and supraclavicular adenopathy, abnormal soft tissues surrounding hila, vertically on the right where this has significantly increased, as well as reticular nodular interstitial densities in the right upper lobe suggesting lymphangitic spread of carcinoma.  Bilateral pleural effusions have developed since the prior exam.  There are two liver lesions that have increased from the prior exam and there is increased presumed metastatic adenopathy in the left upper quadrant.  Bony metastatic disease is mostly stable although there is a new destructive lesion in the right scapula.   Original Report Authenticated By: Domenic Moras, M.D.    Dg Pelvis Portable  05/23/2012  *RADIOLOGY REPORT*  Clinical Data: Bilateral hip pain.  No injury.  PORTABLE PELVIS  Comparison: Abdomen 05/12/2012  Findings: Heterogeneous areas of lucency and sclerosis in the right superior acetabular region and in the symphysis pubis bilaterally. Stable appearance of  pathologic fractures of the right symphysis pubis region since the previous abdominal film.  There is new cortical lucency extending along the right innominate bone to the base of the superior right pubic ramus suggesting new pathologic fracture.  Heterogeneous sclerosis in the left femoral head consistent with metastasis.  SI joints and  symphysis pubis are not displaced.  IMPRESSION: Changes consistent with known bony metastasis.  Apparent acute nondisplaced pathologic fracture of the right innominate bone. Stable appearance of previously demonstrated pathologic fractures in the symphysis pubis.   Original Report Authenticated By: Marlon Pel, M.D.    Dg Abd Acute W/chest  05/12/2012  *RADIOLOGY REPORT*  Clinical Data: Abdominal pain, breast carcinoma  ACUTE ABDOMEN SERIES (ABDOMEN 2 VIEW & CHEST 1 VIEW)  Comparison: 04/23/2012 and earlier studies  Findings: Asymmetric pleuroparenchymal thickening at the left lung apex.  Coarse perihilar interstitial opacities.  Linear scarring/atelectasis at the left lung base as before.  Vascular clips in the left breast.  Heart size normal.  No effusion.  No free air.  Small bowel decompressed.  Normal distribution of gas and stool throughout colon.  Left pelvic phleboliths.  Sclerotic osseous lesions in the lower lumbar spine, pelvis, and left femoral neck are again noted.  There is a right pubic fracture which was not clearly evident on the prior scan.  IMPRESSION:  1.  New pathologic fracture of the right pubic bone. 2.  Normal bowel gas pattern. 3.  No free air. 4.  Persistent   left apical pleuroparenchymal disease with worsening perihilar interstitial opacities.   Original Report Authenticated By: Osa Craver, M.D.     ASSESSMENT: A 46 year old [place] woman with history of breast cancer stage IV at diagnosis February 2011, with metastases to bone and possibly developing spread to lymph nodes, lungs and liver  (1) status post 30 Gy to T-3 through T-18 October 2009  (2) status post docetaxel, cyclophosphamide and doxorubicin x6, completed in June 2011.  (3) status post Left lumpectomy and sentinel lymph node biopsy August 2011 for a 2-mm focus of residual invasive ductal carcinoma, grade 2, negative sentinel lymph node  (4) tamoxifen started July 2011, with evidence of  progression March 2013  (5) status post radiation to Left breast completed November 2011  (6) s/p radiation to the lower spine, completed 11/15/2011  (7) fulvestrant/goserelin started 11/03/2011, with evidence of progression in September 2013  (8)  status post palliative radiation to the right hip in September 2013  (9) Continuing goserelin and adding anastrazole, beginning 05/09/2012  (10)  continuing on zolendronic acid monthly   PLAN:  This case and treatment plan were reviewed with Dr. Darnelle Catalan today, who also spoke with the patient and her family today. At this time, we will continue Saarah is anastrozole daily. We will hold zoledronic acid today until Kristin [name] is diarrhea resolves, and we make sure she does not have a C. difficile infection. We are sending her home with a "collection kit" so that we can obtain a C. difficile toxin. In the meanwhile, Dr. Darnelle Catalan have suggested prescribing Flagyl, and Questran for the diarrhea.  Kristin [name] will continue to be followed by Hospice. She will have the rest of her medications refilled through her hospice nurse, and this will ensure that all of her refills are appropriate and timely.  Kristin [name] will return to see Dr. Darnelle Catalan in mid-November, and we will hope to resume zoledronic acid at that time. She'll be due for her next dose of Zoladex as well in November.  Kristin [name] and her family voice understanding and agreement with this plan. They know to call with any changes or problems.  Kristin [name]    06/06/2012

## 2012-06-06 NOTE — Telephone Encounter (Signed)
Per staff phone call and POF I have scheduled appt. JMW  

## 2012-06-06 NOTE — Telephone Encounter (Signed)
gve the pt's mother the nov 2013 appt calendar

## 2012-06-06 NOTE — Telephone Encounter (Signed)
Terese RN called reporting patient was given lots of prescriptions today and is confused about what to do.  Asked what we gave her.  Patient was given Flagyl and questran.  Jaymes Graff will call patient's pharmacy and get other meds to clarify patient's request.  Spoke with provider who saw patient today and confirmed only two scripts were given.  Patient did have empty pain medicines bottles that need hospice to refill.  Called Hospicew office to tell Earline RN, Triage and she will text Jaymes Graff to call me.  Awaiting return call from [facility].

## 2012-06-06 NOTE — Telephone Encounter (Signed)
Spoke with Angela Burke at 4:55 pm and explained to her our Providers want Hospice to refill pain meds at appropriate time refill is due is why family is asking Hospice about multiple medicines from today.  Jaymes Graff has called mother three times and will call back through 6:00pm.  Return call received from [facility].  She received a call from patient's mother, reviewed her meds letting her know she confirmed the refills given today.  Mom says she has picked up all refills last Friday, they have the authorized pain meds of Dilaudid, MS contin and Tramadol.  Denies needing any meds at this time.  Terese notified her Hospice will see them for the next weekly visit.  Jaymes Graff also informed me they have started a medicine box so more attention can be given to ensuring patient is receiving her medicines.

## 2012-06-07 ENCOUNTER — Other Ambulatory Visit: Payer: Self-pay | Admitting: Medical Oncology

## 2012-06-07 ENCOUNTER — Telehealth: Payer: Self-pay | Admitting: *Deleted

## 2012-06-07 MED ORDER — MORPHINE SULFATE ER 15 MG PO TBCR: 15.0000 mg | EXTENDED_RELEASE_TABLET | Freq: Two times a day (BID) | ORAL | Status: DC

## 2012-06-07 NOTE — Telephone Encounter (Signed)
Call from [place] with hospice, patient needs a refill on her MSContin and the hospice MD is unable to get this to her today. Requesting that Dr Darnelle Catalan send a 2 weeks supply to [facility] on [address] until hospice MD can go out to see and assess patient situation better. Patient was in office yesterday to see Zollie Scale, RN, called and asked desk nurse to check with providers to see if we can do this for patient.

## 2012-06-13 ENCOUNTER — Other Ambulatory Visit: Payer: Self-pay | Admitting: *Deleted

## 2012-06-13 ENCOUNTER — Telehealth: Payer: Self-pay | Admitting: *Deleted

## 2012-06-13 MED ORDER — MORPHINE SULFATE ER 15 MG PO TBCR: 15.0000 mg | EXTENDED_RELEASE_TABLET | Freq: Two times a day (BID) | ORAL | Status: DC

## 2012-06-13 NOTE — Telephone Encounter (Signed)
Kristin [name] with [facility] called to state she is in pt's home now and again is reviewing medications. Noted multiple pills in pill box unknown as well as bottles of previous prescriptions in with current medications. Concern per Kristin [name] is " what should she really be on because she has times she is quite confused and the family is not sure what to give her as well ".  This [name] reviewed meds and plan per conversation is for pt to be on long acting medication MS Contin 15mg  bid and to use the hydromorphone 2mg  at 1-2 tabs q 4 hours as needed for breakthrough pain.  Kristin [name] will review with the family not to use the percocet or the tramadol at this time.  This [name] will remove these medication from her current medication list.

## 2012-06-13 NOTE — Telephone Encounter (Signed)
Kristin [name] called reporting patient needs refills on dilaudid and ms contin.  Hospice provider will not refill.  Has only one pills of dilaudid left and was given sixty on 06-07-2012.  Can take two dilaudid every four hours prn.  The MS Contin 15 mg tab is ordered one every 12 hrs.  She was given 45 tabs by Dr. Alvis Lemmings on Oct. 20, 2013 and has four pills left.  Will notify providers.  Kristin [name] can be reached at [phone].

## 2012-06-20 ENCOUNTER — Other Ambulatory Visit: Payer: Self-pay | Admitting: *Deleted

## 2012-06-20 ENCOUNTER — Other Ambulatory Visit: Payer: Self-pay | Admitting: Oncology

## 2012-06-20 DIAGNOSIS — C50919 Malignant neoplasm of unspecified site of unspecified female breast: Secondary | ICD-10-CM

## 2012-06-20 DIAGNOSIS — R110 Nausea: Secondary | ICD-10-CM

## 2012-06-20 DIAGNOSIS — F419 Anxiety disorder, unspecified: Secondary | ICD-10-CM

## 2012-06-20 MED ORDER — METOCLOPRAMIDE HCL 10 MG PO TABS: 10.0000 mg | ORAL_TABLET | Freq: Four times a day (QID) | ORAL | Status: DC

## 2012-06-20 NOTE — Telephone Encounter (Signed)
Duplicate entry

## 2012-06-21 ENCOUNTER — Other Ambulatory Visit: Payer: Self-pay | Admitting: Oncology

## 2012-06-21 DIAGNOSIS — C50919 Malignant neoplasm of unspecified site of unspecified female breast: Secondary | ICD-10-CM

## 2012-06-21 NOTE — Telephone Encounter (Signed)
Called [facility] and confirmed the Reglan and Arimidex have not been picked up yet. Cancelled the scripts and refilled through [facility]

## 2012-06-23 ENCOUNTER — Other Ambulatory Visit: Payer: Self-pay | Admitting: *Deleted

## 2012-06-23 DIAGNOSIS — C50919 Malignant neoplasm of unspecified site of unspecified female breast: Secondary | ICD-10-CM

## 2012-06-23 MED ORDER — HYDROMORPHONE HCL 2 MG PO TABS: 2.0000 mg | ORAL_TABLET | ORAL | Status: DC | PRN

## 2012-06-23 NOTE — Telephone Encounter (Addendum)
Call received from [facility] with [facility] while in the home of the patient.  Concern noted that patient's sister called and requested refills for medication not needed at this time.  Refills are for Arimidex and Reglan- concern is related to medication box is made on weekly basis by [facility]- different family members then take pills from the bottle instead of the pillbox and it is difficult to know exactly what pt has been taking.  Request per Jaymes Graff is  1. Refill request are to be from [facility]osp[facility] RN to office - she is asking if chart can be tagged to reflect this. 2. Situation with concern with medications discussed at team meeting and plan is to proceed with fentanyl patches - pt has MS Contin in home presently with enough      Last until 11/20. Request new script for fentanyl be faxed on Monday for pick up and application on Tuesday. 3. Diluadid refill needed with request for limited amount - request for refill of diluadid # 20.  The above will be reviewed with MD and refill of diluadid will be sent to [facility] on [address].

## 2012-06-27 ENCOUNTER — Other Ambulatory Visit: Payer: Self-pay | Admitting: *Deleted

## 2012-06-27 ENCOUNTER — Ambulatory Visit (HOSPITAL_BASED_OUTPATIENT_CLINIC_OR_DEPARTMENT_OTHER): Payer: Medicare Other

## 2012-06-27 ENCOUNTER — Ambulatory Visit (HOSPITAL_BASED_OUTPATIENT_CLINIC_OR_DEPARTMENT_OTHER): Payer: Medicare Other | Admitting: Oncology

## 2012-06-27 ENCOUNTER — Ambulatory Visit: Payer: Self-pay

## 2012-06-27 ENCOUNTER — Other Ambulatory Visit (HOSPITAL_BASED_OUTPATIENT_CLINIC_OR_DEPARTMENT_OTHER): Payer: Medicare Other | Admitting: Lab

## 2012-06-27 ENCOUNTER — Telehealth: Payer: Self-pay | Admitting: *Deleted

## 2012-06-27 VITALS — BP 131/84 | HR 109 | Temp 97.9°F | Resp 20 | Ht 69.0 in | Wt 137.2 lb

## 2012-06-27 DIAGNOSIS — C50419 Malignant neoplasm of upper-outer quadrant of unspecified female breast: Secondary | ICD-10-CM

## 2012-06-27 DIAGNOSIS — C7951 Secondary malignant neoplasm of bone: Secondary | ICD-10-CM

## 2012-06-27 DIAGNOSIS — C50919 Malignant neoplasm of unspecified site of unspecified female breast: Secondary | ICD-10-CM

## 2012-06-27 DIAGNOSIS — C800 Disseminated malignant neoplasm, unspecified: Secondary | ICD-10-CM

## 2012-06-27 LAB — CBC WITH DIFFERENTIAL/PLATELET
EOS%: 2 % (ref 0.0–7.0)
Eosinophils Absolute: 0.1 10*3/uL (ref 0.0–0.5)
LYMPH%: 11.6 % — ABNORMAL LOW (ref 14.0–49.7)
MCH: 23.8 pg — ABNORMAL LOW (ref 25.1–34.0)
MCV: 72.8 fL — ABNORMAL LOW (ref 79.5–101.0)
MONO%: 9.8 % (ref 0.0–14.0)
NEUT#: 4.6 10*3/uL (ref 1.5–6.5)
Platelets: 290 10*3/uL (ref 145–400)
RBC: 3.82 10*6/uL (ref 3.70–5.45)
nRBC: 0 % (ref 0–0)

## 2012-06-27 LAB — BASIC METABOLIC PANEL (CC13)
CO2: 24 mEq/L (ref 22–29)
Calcium: 9.7 mg/dL (ref 8.4–10.4)
Creatinine: 0.9 mg/dL (ref 0.6–1.1)
Glucose: 99 mg/dl (ref 70–99)

## 2012-06-27 MED ORDER — LORAZEPAM 2 MG/ML IJ SOLN
0.5000 mg | Freq: Once | INTRAMUSCULAR | Status: AC
  Administered 2012-06-27: 0.5 mg via INTRAVENOUS

## 2012-06-27 MED ORDER — FLUCONAZOLE 100 MG PO TABS: 100.0000 mg | ORAL_TABLET | Freq: Every day | ORAL | Status: DC

## 2012-06-27 MED ORDER — FENTANYL 25 MCG/HR TD PT72: 1.0000 | MEDICATED_PATCH | TRANSDERMAL | Status: DC

## 2012-06-27 MED ORDER — ZOLEDRONIC ACID 4 MG/5ML IV CONC
4.0000 mg | Freq: Once | INTRAVENOUS | Status: AC
  Administered 2012-06-27: 4 mg via INTRAVENOUS
  Filled 2012-06-27: qty 5

## 2012-06-27 NOTE — Patient Instructions (Addendum)
Zoledronic Acid injection (Hypercalcemia, Oncology) What is this medicine? ZOLEDRONIC ACID (ZOE le dron ik AS id) lowers the amount of calcium loss from bone. It is used to treat too much calcium in your blood from cancer. It is also used to prevent complications of cancer that has spread to the bone. This medicine may be used for other purposes; ask your health care provider or pharmacist if you have questions. What should I tell my health care provider before I take this medicine? They need to know if you have any of these conditions: -aspirin-sensitive asthma -dental disease -kidney disease -an unusual or allergic reaction to zoledronic acid, other medicines, foods, dyes, or preservatives -pregnant or trying to get pregnant -breast-feeding How should I use this medicine? This medicine is for infusion into a vein. It is given by a health care professional in a hospital or clinic setting. Talk to your pediatrician regarding the use of this medicine in children. Special care may be needed. Overdosage: If you think you have taken too much of this medicine contact a poison control center or emergency room at once. NOTE: This medicine is only for you. Do not share this medicine with others. What if I miss a dose? It is important not to miss your dose. Call your doctor or health care professional if you are unable to keep an appointment. What may interact with this medicine? -certain antibiotics given by injection -NSAIDs, medicines for pain and inflammation, like ibuprofen or naproxen -some diuretics like bumetanide, furosemide -teriparatide -thalidomide This list may not describe all possible interactions. Give your health care provider a list of all the medicines, herbs, non-prescription drugs, or dietary supplements you use. Also tell them if you smoke, drink alcohol, or use illegal drugs. Some items may interact with your medicine. What should I watch for while using this medicine? Visit  your doctor or health care professional for regular checkups. It may be some time before you see the benefit from this medicine. Do not stop taking your medicine unless your doctor tells you to. Your doctor may order blood tests or other tests to see how you are doing. Women should inform their doctor if they wish to become pregnant or think they might be pregnant. There is a potential for serious side effects to an unborn child. Talk to your health care professional or pharmacist for more information. You should make sure that you get enough calcium and vitamin D while you are taking this medicine. Discuss the foods you eat and the vitamins you take with your health care professional. Some people who take this medicine have severe bone, joint, and/or muscle pain. This medicine may also increase your risk for a broken thigh bone. Tell your doctor right away if you have pain in your upper leg or groin. Tell your doctor if you have any pain that does not go away or that gets worse. What side effects may I notice from receiving this medicine? Side effects that you should report to your doctor or health care professional as soon as possible: -allergic reactions like skin rash, itching or hives, swelling of the face, lips, or tongue -anxiety, confusion, or depression -breathing problems -changes in vision -feeling faint or lightheaded, falls -jaw burning, cramping, pain -muscle cramps, stiffness, or weakness -trouble passing urine or change in the amount of urine Side effects that usually do not require medical attention (report to your doctor or health care professional if they continue or are bothersome): -bone, joint, or muscle pain -  fever -hair loss -irritation at site where injected -loss of appetite -nausea, vomiting -stomach upset -tired This list may not describe all possible side effects. Call your doctor for medical advice about side effects. You may report side effects to FDA at  1-800-FDA-1088. Where should I keep my medicine? This drug is given in a hospital or clinic and will not be stored at home. NOTE: This sheet is a summary. It may not cover all possible information. If you have questions about this medicine, talk to your doctor, pharmacist, or health care provider.  2013, Elsevier/Gold Standard. (01/22/2011 9:06:58 AM)  

## 2012-06-27 NOTE — Progress Notes (Signed)
ID: Kristin [name]   DOB: 06-15-1966  MR#: [id number]  NFA#:[id number]  HISTORY OF PRESENT ILLNESS: Kristin [name] was originally seen in the hospital for an initial consult on September 17, 2009. At that time, she presented with back pain and was found to have a markedly abnormal left breast and axilla as well as lytic bone lesions. We obtained a biopsy of the left breast on February 8th which showed a high-grade tumor which was ER 100% receptor positive but Her-2 and progesterone receptor negative. The proliferation fraction was elevated at 33%. We then obtained a bone biopsy on February 11th and her T12 vertebral body indeed was found to harbor an estrogen receptor positive, progesterone receptor negative carcinoma consistent with her breast primary.  She had complete staging studies including a brain MRI with and without contrast which showed no evidence of metastatic disease to the brain, and a CT of the abdomen and pelvis which showed no liver lesions and no other lesions other than in the bones. There was a fibroid uterus incidentally noted. She had a CT of the chest previously (February 7th) which showed only some chronic appearing changes in the left upper lobe, consistent with her history of treated tuberculosis. Again, there were multiple lytic bone lesions.  Kristin [name] was treated in the neoadjuvant setting of docetaxel cyclophosphamide and doxorubicin x6, completed June 2011. She also received radiation therapy to T3 through T12.  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 Grade 2, negative sentinel lymph node. Patient was started on tamoxifen in July 2011, in addition to zoledronic acid which is been given monthly. Her subsequent treatment is as detailed below.  INTERVAL HISTORY: Kristin [name] returns today accompanied by her mother and sister for followup of her metastatic breast carcinoma.  She is currently under hospice care at home, and her nurse visits just about every other day. We do have a note from the nurse suggesting that we switch over to the fentanyl patch instead of MS Contin. I went ahead and wrote that prescription for Kristin [name] to take with her today.  REVIEW OF SYSTEMS: Kristin [name] says she is doing "excellent". She is tolerating the anastrozole with no side effects that she is aware of. She doesn't have much of an appetite, and food doesn't taste good. She doesn't have nausea frequently however and tells me she only vomited once in the last 2 weeks. She says her bowel movements are regular and large when she has them, which is about every 3 or 4 days. They're not hard. They're not liquid either. Her urine is dark, somewhere between T. and Coke-colored. Her pain is currently well-controlled except for the right leg, which is her main problem. She has no problems with her bladder control, no cough, phlegm production, or pleurisy, no unusual headaches. A detailed review of systems was otherwise stable   PAST MEDICAL HISTORY: Past Medical History  Diagnosis Date  . Breast lump   . Thyroid disease   . Hypertension   . Hx of radiation therapy 06/2010, 10/2009, 11/2011    L breast, T3-T12 spine, L-S spine  . 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 Breast cancer 09/2009    L breast, ER+, PR-, Her 2 -  . History of radiation therapy 10/27/11-11/15/11    lumbar/sacral spine/18MV photons    PAST SURGICAL HISTORY: Past Surgical History  Procedure Date  . Knee arthroscopy  left  . Breast surgery     left lumpectomy  . Knee cartilage surgery 2006    FAMILY HISTORY Patient's mother is alive and in her mid 26s. She has a history of treated tuberculosis. The patient's father died from a drug overdose, and the patient does not know much about him. She has one brother with hypertension and one sister, Kristin [name]. There is no known history of breast or ovarian cancer in the  family.   GYNECOLOGIC HISTORY: Gx P1, first pregnancy to term at age 38. Her son was premature.  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 She is currently unemployed. She has been living with her mother, Kristin [name], but now has her own place. The patient's son. Kristin [name], has graduated from high school and has applied for a job core position. He is still at home with the patient at least part-time. The patient is a Control and instrumentation engineer.    ADVANCED DIRECTIVES: DNR in place  HEALTH MAINTENANCE: History  Substance Use Topics  . Smoking status: Former Smoker    Quit date: 08/25/2007  . Smokeless tobacco: Not on file  . Alcohol Use: No     Colonoscopy:  PAP:  Bone density:  Lipid panel:  No Known Allergies  Current Outpatient Prescriptions  Medication Sig Dispense Refill  . anastrozole (ARIMIDEX) 1 MG tablet TAKE ONE TABLET BY MOUTH DAILY  30 tablet  0  . bisacodyl (DULCOLAX) 10 MG suppository Place 1 suppository (10 mg total) rectally daily as needed.  30 suppository  0  . cholestyramine (QUESTRAN) 4 G packet 1 packet by mouth with food twice daily as needed for diarrhea  60 each  12  . diphenhydrAMINE (BENADRYL) 25 MG tablet Take 1 tablet (25 mg total) by mouth every 6 (six) hours as needed for itching, allergies or sleep. For anxiety/sleep.  60 tablet  0  . docusate sodium 100 MG CAPS Take 100 mg by mouth 2 (two) times daily.  45 capsule  0  . HYDROmorphone (DILAUDID) 2 MG tablet Take 1 tablet (2 mg total) by mouth every 4 (four) hours as needed for pain. For pain  20 tablet  0  . levothyroxine (SYNTHROID, LEVOTHROID) 150 MCG tablet Take 150 mcg by mouth daily.      Marland Kitchen LORazepam (ATIVAN) 0.5 MG tablet Take 1 tablet (0.5 mg total) by mouth daily. For anxiety  45 tablet  0  . megestrol (MEGACE ORAL) 40 MG/ML suspension Take 5 mLs (200 mg total) by mouth daily.  240 mL  2  . metoCLOPramide (REGLAN) 10 MG tablet TAKE 1 TABLET BY MOUTH FOUR  TIMES DAILY  60 tablet  0  . metroNIDAZOLE (FLAGYL) 500 MG tablet Take 1 tablet (500 mg total) by mouth 3 (three) times daily.  30 tablet  0  . morphine (MS CONTIN) 15 MG 12 hr tablet Take 1 tablet (15 mg total) by mouth every 12 (twelve) hours.  30 tablet  0  . omeprazole (PRILOSEC) 20 MG capsule Take 20 mg by mouth daily.       . ondansetron (ZOFRAN) 4 MG tablet Take 1 tablet (4 mg total) by mouth every 6 (six) hours as needed for nausea.  45 tablet  0  . ondansetron (ZOFRAN-ODT) 4 MG disintegrating tablet       . PRESCRIPTION MEDICATION Inject into the vein every 28 (twenty-eight) days. Zometa once monthly.      . promethazine (PHENERGAN) 25 MG tablet Take 1 tablet (  25 mg total) by mouth every 6 (six) hours as needed. Nausea  30 tablet  0  . venlafaxine XR (EFFEXOR-XR) 75 MG 24 hr capsule Take 2 capsules (150 mg total) by mouth daily.  30 capsule  12    OBJECTIVE: Middle-aged [place] American woman examined in a wheelchair Filed Vitals:   06/27/12 1249  BP: 131/84  Pulse: 109  Temp: 97.9 F (36.6 C)  Resp: 20     Body mass index is 20.26 kg/(m^2).    ECOG FS: 2 Filed Weights   06/27/12 1249  Weight: 137 lb 3.4 oz (62.238 kg)   Physical Exam: HEENT:  Sclerae anicteric.  Oropharynx shows mild thrush Breast Exam:  Deferred Lungs:  Clear to auscultation bilaterally.  No crackles, rhonchi, or wheezes.  Heart:  Regular rate and rhythm.   Abdomen:  Soft, nontender.  Positive bowel sounds.   Extremities:  No peripheral edema. Right hip flexion is limited by pain Neuro:  Nonfocal, alert and oriented x3; knee extension and dorsiflexion bilaterally are 5 over 5    LAB RESULTS: Lab Results  Component Value Date   WBC 6.0 06/27/2012   NEUTROABS 4.6 06/27/2012   HGB 9.1* 06/27/2012   HCT 27.8* 06/27/2012   MCV 72.8* 06/27/2012   PLT 290 06/27/2012      Chemistry      Component Value Date/Time   NA 134* 05/24/2012 0455   NA 136 05/09/2012 1402   K 3.6 05/24/2012 0455   K 4.5  05/09/2012 1402   CL 98 05/24/2012 0455   CL 102 05/09/2012 1402   CO2 27 05/24/2012 0455   CO2 26 05/09/2012 1402   BUN 7 05/24/2012 0455   BUN 18.0 05/09/2012 1402   CREATININE 0.75 05/24/2012 0455   CREATININE 0.9 05/09/2012 1402      Component Value Date/Time   CALCIUM 9.4 05/24/2012 0455   CALCIUM 11.1* 05/09/2012 1402   ALKPHOS 83 05/23/2012 2045   ALKPHOS 73 05/09/2012 1402   AST 14 05/23/2012 2045   AST 15 05/09/2012 1402   ALT 6 05/23/2012 2045   ALT 9 05/09/2012 1402   BILITOT 0.3 05/23/2012 2045   BILITOT 0.30 05/09/2012 1402       Lab Results  Component Value Date   LABCA2 336* 04/12/2012     STUDIES:  Mr Abdomen W Wo Contrast  04/25/2012  *RADIOLOGY REPORT*  Clinical Data: Pancreatitis, abdominal pain, history of metastatic breast cancer  MRI ABDOMEN WITH AND WITHOUT CONTRAST  Technique:  Multiplanar multisequence MR imaging of the abdomen was performed both before and after administration of intravenous contrast.  Contrast: 14mL MULTIHANCE GADOBENATE DIMEGLUMINE 529 MG/ML IV SOLN  Comparison: CT abdomen pelvis dated 04/23/2012  Findings: Severely motion degraded images.  3.3 x 2.0 cm enhancing metastasis in the right hepatic lobe, just posterior to the IVC (series 3/image 18), unchanged from recent CT. No hepatic steatosis.  Peripancreatic inflammatory changes/edema, most prominently adjacent to the pancreatic body/tail, reflecting known acute pancreatitis.  No associated pancreatic ductal dilatation.  Spleen and adrenal glands are within normal limits.  Gallbladder is unremarkable.  No intrahepatic or extrahepatic ductal dilatation.  No cholelithiasis or choledocholithiasis is seen.  Kidneys are unremarkable.  No hydronephrosis.  Known prominent upper abdominal/retroperitoneal lymph nodes are better demonstrated on recent CT.  Multifocal osseous metastases.  IMPRESSION: Severely motion degraded images.  Peripancreatic inflammatory changes/edema, reflecting known acute  pancreatitis.  No underlying etiology is evident on the current study.  Hepatic and osseous metastases.  Original Report Authenticated By: Charline Bills, M.D.    Ct Abdomen Pelvis W Contrast  04/23/2012  *RADIOLOGY REPORT*  Clinical Data: Abdominal pain with vomiting.  Breast cancer.  CT ABDOMEN AND PELVIS WITH CONTRAST  Technique:  Multidetector CT imaging of the abdomen and pelvis was performed following the standard protocol during bolus administration of intravenous contrast.  Contrast: OMNIPAQUE IOHEXOL 300 MG/ML  SOLN  Comparison: Abdominal pelvic CT 04/07/2012.  Findings: Small bilateral pleural effusions have enlarged.  The lung bases are clear.  A metastasis centrally in the right hepatic lobe adjacent to the IVC is best seen on the delayed images and has slightly enlarged, measuring 3.5 cm in diameter.  The liver otherwise appears normal. The gallbladder and biliary system appear normal.  There is new ill- defined enlargement of the pancreatic tail with mild surrounding soft tissue swelling and ill-defined fluid.  In this patient with a mildly elevated lipase level, these findings are supportive of acute pancreatitis.  The splenic and portal veins are patent.  The spleen and adrenal glands appear normal.  There is no hydronephrosis.  Scattered prominent retroperitoneal and mesenteric lymph nodes are stable.  There is also stable mild peritoneal nodularity.  The patient has developed a small amount of pelvic ascites.  Central low density anteriorly in the uterus likely represents a fibroid. There is no discrete adnexal mass.  Widespread osseous metastatic disease is again demonstrated with pathologic rib fractures and several Schmorl's nodes.  There is a pathologic fracture through the right L4 lamina and the right L5 pars interarticularis.   No significant epidural tumor is identified. There is a sclerotic metastasis involving the left femoral head and neck.  There are lytic metastases  involving the right acetabulum.  IMPRESSION:  1.  New enlargement of the pancreatic body and tail with surrounding edema consistent with acute pancreatitis. 2.  Increased pelvic ascites. 3.  Widespread osseous metastatic disease with scattered pathologic fractures as described.  No significant epidural tumor identified. 4.  Hepatic metastasis appears slightly larger.   Original Report Authenticated By: Gerrianne Scale, M.D.      ASSESSMENT: A 46 year old [place] woman with history of breast cancer stage IV at diagnosis February 2011, with metastases to bone and possibly developing spread to lymph nodes, lungs and liver  (1) status post 30 Gy to T-3 through T-18 October 2009  (2) status post docetaxel, cyclophosphamide and doxorubicin x6, completed in June 2011.  (3) status post Left lumpectomy and sentinel lymph node biopsy August 2011 for a 2-mm focus of residual invasive ductal carcinoma, grade 2, negative sentinel lymph node  (4) tamoxifen started July 2011, with evidence of progression March 2013  (5) status post radiation to Left breast completed November 2011  (6) s/p radiation to the lower spine, completed 11/15/2011  (7) fulvestrant/goserelin started 11/03/2011, with evidence of progression in September 2013  (8)  status post palliative radiation to the right hip in September 2013  (9) Continuing goserelin and adding anastrazole, beginning 05/09/2012  (10)  continuing on zolendronic acid monthly   PLAN:  Kristin [name] situation continues to be complex. Right now her mother is the main caregiver and she is clearly exhausted. The patient's sister tells me she will start coming at night, but she has 2 children and she will have to bring them with her. The patient's son is at home to but "he is not reliable", according to the family. Kristin [name] herself feels she does not need any help at home, even though  it is difficult for her to get around because of the right leg pain. I started her on  Diflucan today for 5 days for thrush, and also on Duragesic 25. If the pain is not well controlled on this of course we can go to 50 or 75 as needed over the next week. Kristin [name] will return to see [place] again in 2 months. She knows to call for any problems that may develop before the next visit. Kristin [name] C    06/27/2012

## 2012-06-27 NOTE — Telephone Encounter (Signed)
Gave patient appointment for 08-28-2012

## 2012-06-28 ENCOUNTER — Other Ambulatory Visit: Payer: Self-pay | Admitting: Oncology

## 2012-06-28 ENCOUNTER — Other Ambulatory Visit: Payer: Self-pay | Admitting: *Deleted

## 2012-06-28 DIAGNOSIS — C50919 Malignant neoplasm of unspecified site of unspecified female breast: Secondary | ICD-10-CM

## 2012-06-28 MED ORDER — PROMETHAZINE HCL 25 MG PO TABS: 25.0000 mg | ORAL_TABLET | Freq: Four times a day (QID) | ORAL | Status: DC | PRN

## 2012-06-28 NOTE — Telephone Encounter (Signed)
Terese at home with patient and needs refill on Phenergan.

## 2012-06-29 ENCOUNTER — Other Ambulatory Visit: Payer: Self-pay | Admitting: *Deleted

## 2012-06-30 NOTE — Telephone Encounter (Signed)
Open in error

## 2012-07-03 ENCOUNTER — Other Ambulatory Visit: Payer: Self-pay | Admitting: *Deleted

## 2012-07-03 ENCOUNTER — Other Ambulatory Visit: Payer: Self-pay | Admitting: Oncology

## 2012-07-03 DIAGNOSIS — C50919 Malignant neoplasm of unspecified site of unspecified female breast: Secondary | ICD-10-CM

## 2012-07-03 MED ORDER — HYDROMORPHONE HCL 2 MG PO TABS: 2.0000 mg | ORAL_TABLET | ORAL | Status: DC | PRN

## 2012-07-03 MED ORDER — FENTANYL 25 MCG/HR TD PT72: MEDICATED_PATCH | TRANSDERMAL | Status: DC

## 2012-07-04 ENCOUNTER — Other Ambulatory Visit: Payer: Self-pay | Admitting: Lab

## 2012-07-04 ENCOUNTER — Other Ambulatory Visit: Payer: Self-pay | Admitting: *Deleted

## 2012-07-04 ENCOUNTER — Ambulatory Visit: Payer: Self-pay

## 2012-07-10 ENCOUNTER — Other Ambulatory Visit: Payer: Self-pay | Admitting: Oncology

## 2012-07-10 ENCOUNTER — Ambulatory Visit (HOSPITAL_BASED_OUTPATIENT_CLINIC_OR_DEPARTMENT_OTHER): Payer: Medicare Other

## 2012-07-10 ENCOUNTER — Other Ambulatory Visit (HOSPITAL_BASED_OUTPATIENT_CLINIC_OR_DEPARTMENT_OTHER): Payer: Medicare Other | Admitting: Lab

## 2012-07-10 VITALS — BP 117/77 | HR 112 | Temp 98.6°F

## 2012-07-10 DIAGNOSIS — Z5111 Encounter for antineoplastic chemotherapy: Secondary | ICD-10-CM

## 2012-07-10 DIAGNOSIS — C50919 Malignant neoplasm of unspecified site of unspecified female breast: Secondary | ICD-10-CM

## 2012-07-10 DIAGNOSIS — C787 Secondary malignant neoplasm of liver and intrahepatic bile duct: Secondary | ICD-10-CM

## 2012-07-10 DIAGNOSIS — C7951 Secondary malignant neoplasm of bone: Secondary | ICD-10-CM

## 2012-07-10 DIAGNOSIS — C800 Disseminated malignant neoplasm, unspecified: Secondary | ICD-10-CM

## 2012-07-10 DIAGNOSIS — R197 Diarrhea, unspecified: Secondary | ICD-10-CM

## 2012-07-10 DIAGNOSIS — C7952 Secondary malignant neoplasm of bone marrow: Secondary | ICD-10-CM

## 2012-07-10 LAB — CBC WITH DIFFERENTIAL/PLATELET
BASO%: 0 % (ref 0.0–2.0)
EOS%: 1 % (ref 0.0–7.0)
HGB: 8.5 g/dL — ABNORMAL LOW (ref 11.6–15.9)
MCH: 25.5 pg (ref 25.1–34.0)
MCHC: 33 g/dL (ref 31.5–36.0)
MONO#: 0.6 10*3/uL (ref 0.1–0.9)
RDW: 22.6 % — ABNORMAL HIGH (ref 11.2–14.5)
WBC: 4.7 10*3/uL (ref 3.9–10.3)
lymph#: 0.3 10*3/uL — ABNORMAL LOW (ref 0.9–3.3)

## 2012-07-10 LAB — COMPREHENSIVE METABOLIC PANEL (CC13)
ALT: 78 U/L — ABNORMAL HIGH (ref 0–55)
AST: 89 U/L — ABNORMAL HIGH (ref 5–34)
Albumin: 2.6 g/dL — ABNORMAL LOW (ref 3.5–5.0)
Alkaline Phosphatase: 957 U/L — ABNORMAL HIGH (ref 40–150)
Potassium: 4.3 mEq/L (ref 3.5–5.1)
Sodium: 135 mEq/L — ABNORMAL LOW (ref 136–145)
Total Bilirubin: 3.77 mg/dL — ABNORMAL HIGH (ref 0.20–1.20)
Total Protein: 6.4 g/dL (ref 6.4–8.3)

## 2012-07-10 MED ORDER — GOSERELIN ACETATE 10.8 MG ~~LOC~~ IMPL
10.8000 mg | DRUG_IMPLANT | Freq: Once | SUBCUTANEOUS | Status: AC
  Administered 2012-07-10: 10.8 mg via SUBCUTANEOUS
  Filled 2012-07-10: qty 10.8

## 2012-07-13 ENCOUNTER — Encounter: Payer: Self-pay | Admitting: Emergency Medicine

## 2012-07-13 ENCOUNTER — Other Ambulatory Visit: Payer: Self-pay | Admitting: Oncology

## 2012-07-13 NOTE — Progress Notes (Signed)
Terese from [facility]osp[facility] called to notify office that Ms Kristin [name] has increased her Fentanyl dosage from to 50 mcg every 72 hours.  No other new changes reported; Jaymes Graff will notify if so.

## 2012-07-17 ENCOUNTER — Other Ambulatory Visit: Payer: Self-pay | Admitting: Emergency Medicine

## 2012-07-17 DIAGNOSIS — C50919 Malignant neoplasm of unspecified site of unspecified female breast: Secondary | ICD-10-CM

## 2012-07-17 MED ORDER — HYDROMORPHONE HCL 2 MG PO TABS: 2.0000 mg | ORAL_TABLET | ORAL | Status: DC | PRN

## 2012-07-17 MED ORDER — FENTANYL 50 MCG/HR TD PT72: 1.0000 | MEDICATED_PATCH | TRANSDERMAL | Status: DC

## 2012-07-18 ENCOUNTER — Other Ambulatory Visit: Payer: Self-pay | Admitting: Emergency Medicine

## 2012-07-20 ENCOUNTER — Telehealth: Payer: Self-pay | Admitting: *Deleted

## 2012-07-20 NOTE — Telephone Encounter (Signed)
Received call from [facility] with [facility] stating pt requested to not take Effexor due to causes her to gag and then vomit.  Discussed above - pt has ativan in home for anxiety if needed. Will d/c from medication list and med box.  Other update is noted increased rhonchi in lung bases. Mild productive cough with white/yellow phlegm. No fevers.  Ongoing weight loss but is eating fair.  Family dynamics are stable with better compliance with pain control since switching from MS Contin to fentanyl patch.

## 2012-07-24 ENCOUNTER — Other Ambulatory Visit: Payer: Self-pay | Admitting: Emergency Medicine

## 2012-07-24 ENCOUNTER — Other Ambulatory Visit: Payer: Self-pay | Admitting: Oncology

## 2012-07-24 DIAGNOSIS — C50919 Malignant neoplasm of unspecified site of unspecified female breast: Secondary | ICD-10-CM

## 2012-07-25 ENCOUNTER — Other Ambulatory Visit (HOSPITAL_BASED_OUTPATIENT_CLINIC_OR_DEPARTMENT_OTHER): Payer: Medicare Other | Admitting: Lab

## 2012-07-25 ENCOUNTER — Other Ambulatory Visit: Payer: Self-pay | Admitting: *Deleted

## 2012-07-25 DIAGNOSIS — C800 Disseminated malignant neoplasm, unspecified: Secondary | ICD-10-CM

## 2012-07-25 DIAGNOSIS — C50919 Malignant neoplasm of unspecified site of unspecified female breast: Secondary | ICD-10-CM

## 2012-07-25 LAB — CBC WITH DIFFERENTIAL/PLATELET
BASO%: 0.2 % (ref 0.0–2.0)
Eosinophils Absolute: 0.1 10*3/uL (ref 0.0–0.5)
HCT: 25.1 % — ABNORMAL LOW (ref 34.8–46.6)
LYMPH%: 5.2 % — ABNORMAL LOW (ref 14.0–49.7)
MCHC: 33.3 g/dL (ref 31.5–36.0)
MCV: 79.7 fL (ref 79.5–101.0)
MONO#: 0.8 10*3/uL (ref 0.1–0.9)
MONO%: 15.5 % — ABNORMAL HIGH (ref 0.0–14.0)
NEUT%: 77 % — ABNORMAL HIGH (ref 38.4–76.8)
Platelets: 372 10*3/uL (ref 145–400)
RBC: 3.15 10*6/uL — ABNORMAL LOW (ref 3.70–5.45)
WBC: 5.2 10*3/uL (ref 3.9–10.3)

## 2012-07-25 LAB — COMPREHENSIVE METABOLIC PANEL (CC13)
Albumin: 2.2 g/dL — ABNORMAL LOW (ref 3.5–5.0)
Alkaline Phosphatase: 832 U/L — ABNORMAL HIGH (ref 40–150)
BUN: 9 mg/dL (ref 7.0–26.0)
Creatinine: 0.7 mg/dL (ref 0.6–1.1)
Glucose: 107 mg/dl — ABNORMAL HIGH (ref 70–99)
Potassium: 3.7 mEq/L (ref 3.5–5.1)

## 2012-07-25 MED ORDER — AZITHROMYCIN 250 MG PO TABS: ORAL_TABLET | ORAL | Status: DC

## 2012-07-25 NOTE — Telephone Encounter (Signed)
Call received from [facility] with [facility] stating per visit and assessment noted scattered increased rhonchi in lungs with congested cough with productive yellow sputum. Pt is running low grade temps with some SOB.  Per MD review recommended Z pak- called and discussed with Jaymes Graff who requested per hospice benefits to request generic form. Called medication to pharmacy.

## 2012-07-28 ENCOUNTER — Telehealth: Payer: Self-pay | Admitting: Medical Oncology

## 2012-07-28 NOTE — Telephone Encounter (Signed)
Pt on z-pack "but have difficulty coughing up sputum. Has diffuse coarse rales" . Per Dr Princella Pellegrini nurse I instructed Kristin [name] to start Mucinex 1200 mg bid and to drink extra fluid

## 2012-07-31 ENCOUNTER — Encounter (HOSPITAL_COMMUNITY): Payer: Self-pay | Admitting: Emergency Medicine

## 2012-07-31 ENCOUNTER — Emergency Department (HOSPITAL_COMMUNITY)

## 2012-07-31 ENCOUNTER — Inpatient Hospital Stay (HOSPITAL_COMMUNITY)
Admission: EM | Admit: 2012-07-31 | Discharge: 2012-08-08 | DRG: 175 | Disposition: A | Discharge status: Hospice | Attending: Internal Medicine | Admitting: Internal Medicine

## 2012-07-31 ENCOUNTER — Telehealth: Payer: Self-pay | Admitting: *Deleted

## 2012-07-31 DIAGNOSIS — C800 Disseminated malignant neoplasm, unspecified: Secondary | ICD-10-CM

## 2012-07-31 DIAGNOSIS — E039 Hypothyroidism, unspecified: Secondary | ICD-10-CM | POA: Diagnosis present

## 2012-07-31 DIAGNOSIS — I214 Non-ST elevation (NSTEMI) myocardial infarction: Secondary | ICD-10-CM | POA: Diagnosis present

## 2012-07-31 DIAGNOSIS — I248 Other forms of acute ischemic heart disease: Secondary | ICD-10-CM | POA: Diagnosis present

## 2012-07-31 DIAGNOSIS — S329XXA Fracture of unspecified parts of lumbosacral spine and pelvis, initial encounter for closed fracture: Secondary | ICD-10-CM

## 2012-07-31 DIAGNOSIS — I498 Other specified cardiac arrhythmias: Secondary | ICD-10-CM | POA: Diagnosis present

## 2012-07-31 DIAGNOSIS — C7800 Secondary malignant neoplasm of unspecified lung: Secondary | ICD-10-CM | POA: Diagnosis present

## 2012-07-31 DIAGNOSIS — R7989 Other specified abnormal findings of blood chemistry: Secondary | ICD-10-CM

## 2012-07-31 DIAGNOSIS — R918 Other nonspecific abnormal finding of lung field: Secondary | ICD-10-CM

## 2012-07-31 DIAGNOSIS — G8929 Other chronic pain: Secondary | ICD-10-CM | POA: Diagnosis present

## 2012-07-31 DIAGNOSIS — R52 Pain, unspecified: Secondary | ICD-10-CM

## 2012-07-31 DIAGNOSIS — I2699 Other pulmonary embolism without acute cor pulmonale: Principal | ICD-10-CM | POA: Diagnosis present

## 2012-07-31 DIAGNOSIS — D649 Anemia, unspecified: Secondary | ICD-10-CM

## 2012-07-31 DIAGNOSIS — C7951 Secondary malignant neoplasm of bone: Secondary | ICD-10-CM | POA: Diagnosis present

## 2012-07-31 DIAGNOSIS — E876 Hypokalemia: Secondary | ICD-10-CM | POA: Diagnosis present

## 2012-07-31 DIAGNOSIS — G893 Neoplasm related pain (acute) (chronic): Secondary | ICD-10-CM | POA: Diagnosis present

## 2012-07-31 DIAGNOSIS — C799 Secondary malignant neoplasm of unspecified site: Secondary | ICD-10-CM

## 2012-07-31 DIAGNOSIS — D630 Anemia in neoplastic disease: Secondary | ICD-10-CM | POA: Diagnosis present

## 2012-07-31 DIAGNOSIS — R197 Diarrhea, unspecified: Secondary | ICD-10-CM | POA: Diagnosis present

## 2012-07-31 DIAGNOSIS — K859 Acute pancreatitis without necrosis or infection, unspecified: Secondary | ICD-10-CM

## 2012-07-31 DIAGNOSIS — C787 Secondary malignant neoplasm of liver and intrahepatic bile duct: Secondary | ICD-10-CM | POA: Diagnosis present

## 2012-07-31 DIAGNOSIS — F419 Anxiety disorder, unspecified: Secondary | ICD-10-CM | POA: Diagnosis present

## 2012-07-31 DIAGNOSIS — R000 Tachycardia, unspecified: Secondary | ICD-10-CM

## 2012-07-31 DIAGNOSIS — C771 Secondary and unspecified malignant neoplasm of intrathoracic lymph nodes: Secondary | ICD-10-CM | POA: Diagnosis present

## 2012-07-31 DIAGNOSIS — E871 Hypo-osmolality and hyponatremia: Secondary | ICD-10-CM

## 2012-07-31 DIAGNOSIS — C7952 Secondary malignant neoplasm of bone marrow: Secondary | ICD-10-CM | POA: Diagnosis present

## 2012-07-31 DIAGNOSIS — J9600 Acute respiratory failure, unspecified whether with hypoxia or hypercapnia: Secondary | ICD-10-CM | POA: Diagnosis present

## 2012-07-31 DIAGNOSIS — J910 Malignant pleural effusion: Secondary | ICD-10-CM | POA: Diagnosis present

## 2012-07-31 DIAGNOSIS — F411 Generalized anxiety disorder: Secondary | ICD-10-CM | POA: Diagnosis present

## 2012-07-31 DIAGNOSIS — R55 Syncope and collapse: Secondary | ICD-10-CM | POA: Diagnosis present

## 2012-07-31 DIAGNOSIS — J189 Pneumonia, unspecified organism: Secondary | ICD-10-CM

## 2012-07-31 DIAGNOSIS — I259 Chronic ischemic heart disease, unspecified: Secondary | ICD-10-CM | POA: Diagnosis present

## 2012-07-31 DIAGNOSIS — F410 Panic disorder [episodic paroxysmal anxiety] without agoraphobia: Secondary | ICD-10-CM | POA: Diagnosis present

## 2012-07-31 DIAGNOSIS — I471 Supraventricular tachycardia: Secondary | ICD-10-CM

## 2012-07-31 DIAGNOSIS — D509 Iron deficiency anemia, unspecified: Secondary | ICD-10-CM | POA: Diagnosis present

## 2012-07-31 DIAGNOSIS — Z66 Do not resuscitate: Secondary | ICD-10-CM | POA: Diagnosis present

## 2012-07-31 DIAGNOSIS — C50919 Malignant neoplasm of unspecified site of unspecified female breast: Secondary | ICD-10-CM | POA: Diagnosis present

## 2012-07-31 LAB — CBC WITH DIFFERENTIAL/PLATELET
Basophils Absolute: 0 10*3/uL (ref 0.0–0.1)
Eosinophils Absolute: 0.1 10*3/uL (ref 0.0–0.7)
HCT: 27.9 % — ABNORMAL LOW (ref 36.0–46.0)
Lymphocytes Relative: 9 % — ABNORMAL LOW (ref 12–46)
Lymphs Abs: 0.6 10*3/uL — ABNORMAL LOW (ref 0.7–4.0)
MCHC: 33 g/dL (ref 30.0–36.0)
MCV: 77.3 fL — ABNORMAL LOW (ref 78.0–100.0)
Monocytes Absolute: 0.9 10*3/uL (ref 0.1–1.0)
Neutro Abs: 5 10*3/uL (ref 1.7–7.7)
RDW: 23.1 % — ABNORMAL HIGH (ref 11.5–15.5)

## 2012-07-31 LAB — POCT I-STAT, CHEM 8
Glucose, Bld: 107 mg/dL — ABNORMAL HIGH (ref 70–99)
HCT: 31 % — ABNORMAL LOW (ref 36.0–46.0)
Hemoglobin: 10.5 g/dL — ABNORMAL LOW (ref 12.0–15.0)
Potassium: 4 mEq/L (ref 3.5–5.1)
Sodium: 142 mEq/L (ref 135–145)
TCO2: 25 mmol/L (ref 0–100)

## 2012-07-31 LAB — MRSA PCR SCREENING: MRSA by PCR: NEGATIVE

## 2012-07-31 LAB — URINALYSIS, ROUTINE W REFLEX MICROSCOPIC
Hgb urine dipstick: NEGATIVE
Protein, ur: NEGATIVE mg/dL
Urobilinogen, UA: 1 mg/dL (ref 0.0–1.0)

## 2012-07-31 LAB — LACTIC ACID, PLASMA: Lactic Acid, Venous: 1.7 mmol/L (ref 0.5–2.2)

## 2012-07-31 LAB — PROTIME-INR: Prothrombin Time: 15.5 seconds — ABNORMAL HIGH (ref 11.6–15.2)

## 2012-07-31 MED ORDER — ONDANSETRON HCL 4 MG PO TABS: 4.0000 mg | ORAL_TABLET | Freq: Four times a day (QID) | ORAL | Status: DC | PRN

## 2012-07-31 MED ORDER — VANCOMYCIN HCL 10 G IV SOLR
1500.0000 mg | Freq: Two times a day (BID) | INTRAVENOUS | Status: DC
  Administered 2012-07-31: 1500 mg via INTRAVENOUS
  Filled 2012-07-31 (×2): qty 1500

## 2012-07-31 MED ORDER — ADENOSINE 6 MG/2ML IV SOLN
6.0000 mg | Freq: Once | INTRAVENOUS | Status: AC
  Administered 2012-07-31: 6 mg via INTRAVENOUS

## 2012-07-31 MED ORDER — ANASTROZOLE 1 MG PO TABS
1.0000 mg | ORAL_TABLET | Freq: Every day | ORAL | Status: DC
  Administered 2012-08-01 – 2012-08-08 (×8): 1 mg via ORAL
  Filled 2012-07-31 (×9): qty 1

## 2012-07-31 MED ORDER — ONDANSETRON HCL 4 MG/2ML IJ SOLN
4.0000 mg | Freq: Four times a day (QID) | INTRAMUSCULAR | Status: DC | PRN
  Administered 2012-08-06 (×2): 4 mg via INTRAVENOUS
  Filled 2012-07-31 (×2): qty 2

## 2012-07-31 MED ORDER — IOHEXOL 350 MG/ML SOLN
100.0000 mL | Freq: Once | INTRAVENOUS | Status: AC | PRN
  Administered 2012-07-31: 100 mL via INTRAVENOUS

## 2012-07-31 MED ORDER — ADENOSINE 6 MG/2ML IV SOLN
INTRAVENOUS | Status: AC
  Filled 2012-07-31: qty 6

## 2012-07-31 MED ORDER — LEVOTHYROXINE SODIUM 150 MCG PO TABS
150.0000 ug | ORAL_TABLET | Freq: Every day | ORAL | Status: DC
  Administered 2012-08-01 – 2012-08-08 (×8): 150 ug via ORAL
  Filled 2012-07-31 (×9): qty 1

## 2012-07-31 MED ORDER — SODIUM CHLORIDE 0.9 % IJ SOLN
3.0000 mL | Freq: Two times a day (BID) | INTRAMUSCULAR | Status: DC
  Administered 2012-07-31 – 2012-08-02 (×5): 3 mL via INTRAVENOUS
  Administered 2012-08-03: 11:00:00 via INTRAVENOUS
  Administered 2012-08-03: 3 mL via INTRAVENOUS
  Administered 2012-08-04: 11:00:00 via INTRAVENOUS
  Administered 2012-08-04 – 2012-08-06 (×4): 3 mL via INTRAVENOUS

## 2012-07-31 MED ORDER — ACETAMINOPHEN 650 MG RE SUPP: 650.0000 mg | Freq: Four times a day (QID) | RECTAL | Status: DC | PRN

## 2012-07-31 MED ORDER — SODIUM CHLORIDE 0.9 % IV SOLN
INTRAVENOUS | Status: DC
  Administered 2012-07-31: 15:00:00 via INTRAVENOUS
  Administered 2012-08-01: 1000 mL via INTRAVENOUS
  Administered 2012-08-01: 13:00:00 via INTRAVENOUS
  Administered 2012-08-02: 1000 mL via INTRAVENOUS

## 2012-07-31 MED ORDER — PROMETHAZINE HCL 25 MG PO TABS
25.0000 mg | ORAL_TABLET | Freq: Four times a day (QID) | ORAL | Status: DC | PRN
  Administered 2012-08-07 – 2012-08-08 (×2): 25 mg via ORAL
  Filled 2012-07-31 (×2): qty 1

## 2012-07-31 MED ORDER — METOCLOPRAMIDE HCL 10 MG PO TABS
10.0000 mg | ORAL_TABLET | Freq: Four times a day (QID) | ORAL | Status: DC
  Administered 2012-07-31 – 2012-08-03 (×11): 10 mg via ORAL
  Filled 2012-07-31 (×18): qty 1

## 2012-07-31 MED ORDER — ACETAMINOPHEN 325 MG PO TABS: 650.0000 mg | ORAL_TABLET | Freq: Four times a day (QID) | ORAL | Status: DC | PRN

## 2012-07-31 MED ORDER — HEPARIN BOLUS VIA INFUSION
4000.0000 [IU] | Freq: Once | INTRAVENOUS | Status: AC
  Administered 2012-07-31: 4000 [IU] via INTRAVENOUS

## 2012-07-31 MED ORDER — HEPARIN (PORCINE) IN NACL 100-0.45 UNIT/ML-% IJ SOLN
1200.0000 [IU]/h | INTRAMUSCULAR | Status: DC
  Administered 2012-07-31 – 2012-08-01 (×2): 1200 [IU]/h via INTRAVENOUS
  Filled 2012-07-31 (×3): qty 250

## 2012-07-31 MED ORDER — HYDROMORPHONE HCL PF 1 MG/ML IJ SOLN
1.0000 mg | Freq: Once | INTRAMUSCULAR | Status: AC
  Administered 2012-07-31: 1 mg via INTRAVENOUS
  Filled 2012-07-31: qty 1

## 2012-07-31 MED ORDER — MEGESTROL ACETATE 400 MG/10ML PO SUSP
200.0000 mg | Freq: Every day | ORAL | Status: DC
  Administered 2012-08-01 – 2012-08-08 (×8): 200 mg via ORAL
  Filled 2012-07-31 (×8): qty 5

## 2012-07-31 MED ORDER — FENTANYL 50 MCG/HR TD PT72
50.0000 ug | MEDICATED_PATCH | TRANSDERMAL | Status: DC
  Administered 2012-08-01 – 2012-08-07 (×3): 50 ug via TRANSDERMAL
  Filled 2012-07-31 (×3): qty 1

## 2012-07-31 MED ORDER — ALBUTEROL SULFATE (5 MG/ML) 0.5% IN NEBU
2.5000 mg | INHALATION_SOLUTION | RESPIRATORY_TRACT | Status: DC
  Administered 2012-07-31 – 2012-08-05 (×31): 2.5 mg via RESPIRATORY_TRACT
  Filled 2012-07-31 (×31): qty 0.5

## 2012-07-31 MED ORDER — PIPERACILLIN-TAZOBACTAM 3.375 G IVPB 30 MIN
3.3750 g | Freq: Three times a day (TID) | INTRAVENOUS | Status: DC
  Administered 2012-07-31 – 2012-08-07 (×20): 3.375 g via INTRAVENOUS
  Filled 2012-07-31 (×22): qty 50

## 2012-07-31 MED ORDER — HYDROMORPHONE HCL PF 1 MG/ML IJ SOLN
1.0000 mg | INTRAMUSCULAR | Status: DC | PRN
  Administered 2012-08-01 – 2012-08-03 (×13): 1 mg via INTRAVENOUS
  Filled 2012-07-31 (×13): qty 1

## 2012-07-31 MED ORDER — DOCUSATE SODIUM 100 MG PO CAPS
100.0000 mg | ORAL_CAPSULE | Freq: Every day | ORAL | Status: DC
  Administered 2012-07-31 – 2012-08-03 (×3): 100 mg via ORAL
  Filled 2012-07-31 (×5): qty 1

## 2012-07-31 NOTE — ED Notes (Signed)
Pt's mother called wanting to speak with me regarding pt's condition and her status on being admitted.  Verbal ok from pt to give her information out to her mother.

## 2012-07-31 NOTE — H&P (Addendum)
[facility] History and Physical  Kristin [name] AVW:[id number] DOB: [birth year] DOA: 07/31/2012   PCP: [name], MD   Chief Complaint: dyspnea  HPI:  46 year old female with a history of stage IV breast cancer with metastases to liver and bone (thoracic vertebrae) presents with 3 to four-day history of shortness of breath accompanied with nonproductive cough and chest discomfort. The patient is most recently s/p fulvestrant/goserelin 11/03/11 with evidence of progressive disease in sept 2013.  S/p right hip XRT sept 2013 and to lower spine completed on 11/15/11.  Currently, she continues on anastrazole, started 05/09/12. The patient denied any fever, chills, vomiting, diarrhea, abdominal pain, dysuria, hematuria. Apparently, the patient's mother called her Dr. Darrall Dears office 07/28/12 for the sob, cough, congestion, and the patient was given prescription for azithromycin which the patient has been taking over the weekend. Because of progressive shortness of breath, the patient came to emergency department for further evaluation. The patient was noted to be in SVT by EMS initially with a heart rate of 208, and the patient was given adenosine which converted her to sinus tachycardia with a heart rate in the 130s. CT angiogram of the chest revealed bilateral pulmonary emboli in the right lower lobe and left upper lobe. The patient was started on a heparin drip in the emergency department. In addition, the patient also relates a history of syncope approximately 4 days ago when she was in the kitchen fixing something to eat. Episode was unwitnessed. She stated that her shortness of breath began shortly prior to her syncope episode. The patient did not seek any medical care. The patient denies any other syncope episodes since that time, but has had progressive shortness of breath as well as palpitations. Of note, the patient is followed by hospice care of [facility], but she desires to be a full  code at this time. This was confirmed by the patient's family at the bedside. Assessment/Plan: Pulmonary embolism - Continue heparin drip for now -Will likely need parenteral anticoagulation long-term due  to concomitant metastatic breast cancer -CCM has already been consulted and they will follow the patient -Echocardiogram -consult oncology for indefinite parenteral anticogulation --Arixtra vs. Lovenox Stage IV breast cancer -followed by Dr. Darnelle Catalan -I spoke with oncologist on-call for consult, Dr. Gaylyn Rong -Dr. Gaylyn Rong recommended to contact Dr. Darnelle Catalan directly in am, but he will put on Dr. Darrall Dears consult list  -patient is FULL code Chest discomfort/elevated troponin -Likely due to the patient's pulmonary embolus and SVT -EKG without any acute ST-T wave changes -Cycle troponins -Suspect the patient may have been in SVT for the past 72 hours given her syncopal episode 3-4 days ago. Malignant pleural effusions -This may be contributing to the patient's dyspnea -May need consultation with IR for thoracocentesis Pulmonary infiltrates -Noted in the right upper lobe -Difficult to differentiate infectious process versus lymphangitic tumor spread -Blood cultures have been performed -Given patient's immunocompromised status, start empiric vancomycin and Zosyn -albuterol nebulizer Syncope -Likely due to the patient's SVT--->back in sinus post adenosine Chronic pain -Start patient on intravenous hydromorphone -Continue fentanyl patch 50 mcg every 72 hours Microcytic anemia -Check iron studies -Hemoglobin is at baseline, between 8-9        Past Medical History  Diagnosis Date  . Breast lump   . Thyroid disease   . Hypertension   . Hx of radiation therapy 06/2010, 10/2009, 11/2011    L breast, T3-T12 spine, L-S spine  . 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 Breast cancer 09/2009  L breast, ER+, PR-, Her 2 -  . History of radiation therapy 10/27/11-11/15/11    lumbar/sacral  spine/18MV photons   Past Surgical History  Procedure Date  . Knee arthroscopy     left  . Breast surgery     left lumpectomy  . Knee cartilage surgery 2006   Social History:  reports that she quit smoking about 4 years ago. She has never used smokeless tobacco. She reports that she does not drink alcohol or use illicit drugs.   History reviewed. No pertinent family history. family history reviewed, no pertinent family history of venous thromboembolism   No Known Allergies    Prior to Admission medications   Medication Sig Start Date End Date Taking? Authorizing Provider  anastrozole (ARIMIDEX) 1 MG tablet TAKE 1 TABLET BY MOUTH DAILY 07/24/12  Yes [name], MD  azithromycin [facility]) 250 MG tablet Take as directed 07/25/12  Yes [name], MD  cholestyramine Lanetta Inch) 4 G packet 1 packet by mouth with food twice daily as needed for diarrhea 06/06/12  Yes Amy Allegra Grana, PA  docusate sodium (COLACE) 100 MG capsule Take 100 mg by mouth daily.   Yes Historical Provider, MD  fentaNYL (DURAGESIC - DOSED MCG/HR) 50 MCG/HR Place 1 patch (50 mcg total) onto the skin every 3 (three) days. 07/17/12  Yes [name], MD  HYDROmorphone (DILAUDID) 2 MG tablet Take 1 tablet (2 mg total) by mouth every 4 (four) hours as needed for pain. For pain 07/17/12  Yes [name], MD  levothyroxine (SYNTHROID, LEVOTHROID) 150 MCG tablet Take 150 mcg by mouth daily.   Yes Historical Provider, MD  LORazepam (ATIVAN) 0.5 MG tablet Take 1 tablet (0.5 mg total) by mouth daily. For anxiety 05/27/12  Yes Richarda Overlie, MD  megestrol (MEGACE) 400 MG/10ML suspension Take 200 mg by mouth daily.   Yes Historical Provider, MD  metoCLOPramide (REGLAN) 10 MG tablet Take 10 mg by mouth 4 (four) times daily.   Yes Historical Provider, MD  ondansetron (ZOFRAN-ODT) 4 MG disintegrating tablet Take 8 mg by mouth every 8 (eight) hours as needed. For nausea 04/19/12  Yes Historical Provider, MD   PRESCRIPTION MEDICATION Inject into the vein every 28 (twenty-eight) days. Zometa once monthly.   Yes Historical Provider, MD  promethazine (PHENERGAN) 25 MG tablet Take 1 tablet (25 mg total) by mouth every 6 (six) hours as needed. Nausea 06/28/12  Yes [name], MD    Review of Systems:  Constitutional:  No night sweats, Fevers, chills, fatigue.  Head&Eyes: No headache.  No vision loss.   ENT:  No Difficulty swallowing,Tooth/dental problems,Sore throat,  No ear ache, post nasal drip,  Cardio-vascular:  Complains of chest discomfort and swelling in the lower extremities. Swelling in lower extremities not worse than usual GI:  No  abdominal pain, nausea, vomiting, diarrhea, loss of appetite, hematochezia, melena, heartburn, indigestion, Resp:  No shortness of breath with exertion or at rest. No cough. No coughing up of blood  Skin:  no rash or lesions.  GU:  no dysuria, change in color of urine, no urgency or frequency. No flank pain.  Musculoskeletal:  Complains of back pain Psych:  No change in mood or affect. No depression or anxiety. Neurologic: No headache, no dysesthesia, no focal weakness, no vision loss  Physical Exam: Filed Vitals:   07/31/12 1550 07/31/12 1726 07/31/12 1745 07/31/12 1845  BP: 103/73   121/84  Pulse: 130  127   Temp: 99.1 F (  37.3 C)     TempSrc: Oral     Resp: 21  25 18   Height:  5' 9.5" (1.765 m)    Weight:  72.576 kg (160 lb)    SpO2: 100%  100%    General:  A&O, NAD, nontoxic, pleasant/cooperative; appears chronically ill Head/Eye: No conjunctival hemorrhage, no icterus, [place]/AT, No nystagmus ENT:  No icterus,  No thrush,  no pharyngeal exudate Neck: Bilateral anterior cervical lymphadenopathy CV:  RRR, no rub, no gallop, no S3 Lung:  Bilateral scattered rhonchi Abdomen: soft/NT, +BS, nondistended, no peritoneal signs Ext: No cyanosis, No rashes, No petechiae, No lymphangitis, 1+ edema bilateral lower extremities  Labs on  Admission:  Basic Metabolic Panel:  Lab 07/31/12 4782 07/25/12 1124  NA 142 139  K 4.0 3.7  CL 106 105  CO2 -- 28  GLUCOSE 107* 107*  BUN 7 9.0  CREATININE 0.70 0.7  CALCIUM -- 9.1  MG -- --  PHOS -- --   Liver Function Tests:  Lab 07/25/12 1124  AST 46*  ALT 31  ALKPHOS 832*  BILITOT 1.32*  PROT 6.2*  ALBUMIN 2.2*   No results found for this basename: LIPASE:5,AMYLASE:5 in the last 168 hours No results found for this basename: AMMONIA:5 in the last 168 hours CBC:  Lab 07/31/12 1445 07/31/12 1425 07/25/12 1124  WBC -- 6.6 5.2  NEUTROABS -- 5.0 4.0  HGB 10.5* 9.2* 8.4*  HCT 31.0* 27.9* 25.1*  MCV -- 77.3* 79.7  PLT -- 378 372   Cardiac Enzymes:  Lab 07/31/12 1426  CKTOTAL --  CKMB --  CKMBINDEX --  TROPONINI 0.43*   BNP: No components found with this basename: POCBNP:5 CBG: No results found for this basename: GLUCAP:5 in the last 168 hours  Radiological Exams on Admission: Ct Angio Chest W/cm &/or Wo Cm  07/31/2012  *RADIOLOGY REPORT*  Clinical Data: Breathing discomfort, increasing shortness of breath, metastatic breast cancer, hypertension  CT ANGIOGRAPHY CHEST  Technique:  Multidetector CT imaging of the chest using the standard protocol during bolus administration of intravenous contrast. Multiplanar reconstructed images including MIPs were obtained and reviewed to evaluate the vascular anatomy.  Contrast: OMNIPAQUE IOHEXOL 350 MG/ML SOLN  Comparison: 05/24/2012  Findings: Aorta normal caliber without aneurysm or dissection. Metastatic lesion at medial aspect right lobe liver image 96 4.0 x 3.3 cm, previously 3.1 x 2.0 cm and lateral segment left lobe 7.0 x 4.5 cm previously 6.1 x 2.8 cm. Significant intrahepatic biliary dilatation. Question abnormal soft tissue and gastrohepatic ligament suspicious for tumor. New significant intrahepatic biliary dilatation. Thickening of the left adrenal gland and perihepatic ascites noted. Soft tissue nodule posterior  to the upper pole of the left kidney 7 mm diameter unchanged.  Abnormal soft tissues identified throughout the anterior mediastinum, both hila, and subcarinal region compatible with tumor increased since previous exam. Multiple right retro pectoral nodes, slightly increased. Question adenopathy at the bases of the cervical regions bilaterally. Narrowing of the right upper lobe bronchi at the origins. Moderate sized bilateral pleural effusions greater on left.  Multiple pulmonary emboli identified within right lower lobe pulmonary arteries. Questionable small pulmonary embolus in the left lower lobe. Compression of the right upper right lower lobe pulmonary arteries by soft tissue at the left hilum. Extensive infiltrate identified in the right upper lobe, potentially lymphangitic tumor. Increased volume loss in the upper lobes. Additional areas of potential tumor are identified in the the right middle lobe and left upper lobe. Minimal compressive atelectasis in  bilateral lower lobes. Numerous osseous metastases involving thoracic vertebrae and right scapula, including a destructive lesion involving right glenoid with associated fragmentation.  IMPRESSION: Pulmonary emboli identified within right lower lobe pulmonary arteries and likely a small embolus in a left lower lobe pulmonary artery. Increased abnormal mediastinal and bilateral hilar soft tissue since previous exam compatible with tumor progression. Increased size of hepatic metastases and pulmonary tumor spread. Increased bilateral pleural effusions. Osseous metastatic disease and right retro pectoral adenopathy.  Findings called to Dr. Manus Gunning  on 07/31/2029 at 1718 hours.   Original Report Authenticated By: Ulyses Southward, M.D.    Dg Chest Port 1 View  07/31/2012  *RADIOLOGY REPORT*  Clinical Data: Breast cancer and short of breath  PORTABLE CHEST - 1 VIEW  Comparison: CT chest 05/24/2012, chest x-ray 05/23/2012  Findings: Progression of prominent  interstitial markings bilaterally especially in the right upper lobe and  right lower lobe.  Right perihilar mass lesion appears larger.  This may be due to lymphangitic spread of breast cancer.  Lung cancer not excluded.  Extensive pleural parenchymal scarring left upper lobe is unchanged.     Heart size is normal. Increase in left effusion and left lower lobe atelectasis.  IMPRESSION: Progression of prominent interstitial markings on the right with perihilar mass lesion.  This may be due to lymphangitic spread of tumor.  Increase in left lower lobe atelectasis and left effusion.  Extensive scarring in the left apex is stable.   Original Report Authenticated By: Janeece Riggers, M.D.     EKG: Independently reviewed. Sinus tach, no ST-T change    Time spent:70 minutes Code Status:   FULL Family Communication:   son at bedside   Nekhi Liwanag, DO  [facility] Pager [phone]  If 7PM-7AM, please contact night-coverage [email or web address] Password [facility] 07/31/2012, 7:15 PM

## 2012-07-31 NOTE — Telephone Encounter (Signed)
This RN was contacted by [facility] RN - Jaymes Graff- stating she is at the patients home, pt is notably declining. Pt is lethargic with increased BP, heart rate and respirations. Lungs sound " totally full " .  Pt arouses with stimulation but not verbal.  Pt is a full code and family desires more aggressive care  Plan per MD review with hospice recommendation is for pt to proceed to ER for urgent care.

## 2012-07-31 NOTE — ED Notes (Signed)
Per EMS pt came from home. Pt c/o Shob times 3 days and rapid heart rate started today.  Pt has hx liver, lung, breast and bone cancer not currently receiving any treatment except pain management. Pt is full code. Pt had fever couple weeks ago and prescribed zpak.  20g right ac.

## 2012-07-31 NOTE — ED Notes (Signed)
[name] RN from Hospice called to follow up with pt's status of being admitted or not.

## 2012-07-31 NOTE — ED Provider Notes (Signed)
History     CSN: [id number]  Arrival date & time 07/31/12  1403   First MD Initiated Contact with Patient 07/31/12 1418      Chief Complaint  Patient presents with  . Shortness of Breath  . Tachycardia    (Consider location/radiation/quality/duration/timing/severity/associated sxs/prior treatment) Patient is a 46 y.o. female presenting with shortness of breath. The history is provided by the patient.  Shortness of Breath  The current episode started 3 to 5 days ago. Associated symptoms include chest pain, cough and shortness of breath. Pertinent negatives include no fever.   patient with shortness of breath for the last 3 days. Faster heart rate reportedly began today. She's a history of metastatic cancer and is not currently on chemotherapy. She is reportedly a full code but is on hospice. Found to be in an SVT by EMS. No fevers. Occasional cough. She does have metastatic cancer to her lungs.   Past Medical History  Diagnosis Date  . Breast lump   . Thyroid disease   . Hypertension   . Hx of radiation therapy 06/2010, 10/2009, 11/2011    L breast, T3-T12 spine, L-S spine  . 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 Breast cancer 09/2009    L breast, ER+, PR-, Her 2 -  . History of radiation therapy 10/27/11-11/15/11    lumbar/sacral spine/18MV photons    Past Surgical History  Procedure Date  . Knee arthroscopy     left  . Breast surgery     left lumpectomy  . Knee cartilage surgery 2006    No family history on file.  History  Substance Use Topics  . Smoking status: Former Smoker    Quit date: 08/25/2007  . Smokeless tobacco: Not on file  . Alcohol Use: No    OB History    Grav Para Term Preterm Abortions TAB SAB Ect Mult Living                  Review of Systems  Constitutional: Positive for fatigue. Negative for fever.  Respiratory: Positive for cough and shortness of breath.   Cardiovascular: Positive for chest pain.  Gastrointestinal: Negative for abdominal  pain.  Musculoskeletal: Positive for back pain. Negative for joint swelling.  Neurological: Negative for headaches.    Allergies  Review of patient's allergies indicates no known allergies.  Home Medications   Current Outpatient Rx  Name  Route  Sig  Dispense  Refill  . ANASTROZOLE 1 MG PO TABS      TAKE 1 TABLET BY MOUTH DAILY   30 tablet   2   . AZITHROMYCIN 250 MG PO TABS      Take as directed   6 each   0   . CHOLESTYRAMINE 4 G PO PACK      1 packet by mouth with food twice daily as needed for diarrhea   60 each   12   . FENTANYL 50 MCG/HR TD PT72   Transdermal   Place 1 patch (50 mcg total) onto the skin every 3 (three) days.   5 patch   0   . HYDROMORPHONE HCL 2 MG PO TABS   Oral   Take 1 tablet (2 mg total) by mouth every 4 (four) hours as needed for pain. For pain   15 tablet   0   . LEVOTHYROXINE SODIUM 150 MCG PO TABS   Oral   Take 150 mcg by mouth daily.         Marland Kitchen  LORAZEPAM 0.5 MG PO TABS   Oral   Take 1 tablet (0.5 mg total) by mouth daily. For anxiety   45 tablet   0   . MEGESTROL ACETATE 400 MG/10ML PO SUSP   Oral   Take 200 mg by mouth daily.         Marland Kitchen ONDANSETRON 4 MG PO TBDP   Oral   Take 8 mg by mouth every 8 (eight) hours as needed. For nausea         . PROMETHAZINE HCL 25 MG PO TABS   Oral   Take 1 tablet (25 mg total) by mouth every 6 (six) hours as needed. Nausea   30 tablet   1   . PRESCRIPTION MEDICATION   Intravenous   Inject into the vein every 28 (twenty-eight) days. Zometa once monthly.           BP 103/73  Pulse 130  Temp 99.1 F (37.3 C) (Oral)  Resp 21  SpO2 100%  Physical Exam  Constitutional: She is oriented to person, place, and time. She appears well-developed.  HENT:  Head: Normocephalic.  Eyes: Pupils are equal, round, and reactive to light.  Neck: Neck supple.  Cardiovascular:       Severe tachycardia  Pulmonary/Chest:       Diffuse harsh breath sounds.   Abdominal: There is no  tenderness. There is no rebound.  Musculoskeletal: She exhibits edema.  Neurological: She is alert and oriented to person, place, and time.  Skin: Skin is warm.    ED Course  Procedures (including critical care time)  Labs Reviewed  CBC WITH DIFFERENTIAL - Abnormal; Notable for the following:    RBC 3.61 (*)     Hemoglobin 9.2 (*)     HCT 27.9 (*)     MCV 77.3 (*)     MCH 25.5 (*)     RDW 23.1 (*)     All other components within normal limits  PRO B NATRIURETIC PEPTIDE - Abnormal; Notable for the following:    Pro B Natriuretic peptide (BNP) 1950.0 (*)     All other components within normal limits  POCT I-STAT, CHEM 8 - Abnormal; Notable for the following:    Glucose, Bld 107 (*)     Hemoglobin 10.5 (*)     HCT 31.0 (*)     All other components within normal limits  TROPONIN I - Abnormal; Notable for the following:    Troponin I 0.43 (*)     All other components within normal limits  LACTIC ACID, PLASMA  URINALYSIS, ROUTINE W REFLEX MICROSCOPIC  URINE CULTURE  CULTURE, BLOOD (ROUTINE X 2)  CULTURE, BLOOD (ROUTINE X 2)   Dg Chest Port 1 View  07/31/2012  *RADIOLOGY REPORT*  Clinical Data: Breast cancer and short of breath  PORTABLE CHEST - 1 VIEW  Comparison: CT chest 05/24/2012, chest x-ray 05/23/2012  Findings: Progression of prominent interstitial markings bilaterally especially in the right upper lobe and  right lower lobe.  Right perihilar mass lesion appears larger.  This may be due to lymphangitic spread of breast cancer.  Lung cancer not excluded.  Extensive pleural parenchymal scarring left upper lobe is unchanged.     Heart size is normal. Increase in left effusion and left lower lobe atelectasis.  IMPRESSION: Progression of prominent interstitial markings on the right with perihilar mass lesion.  This may be due to lymphangitic spread of tumor.  Increase in left lower lobe atelectasis and left effusion.  Extensive scarring in the left apex is stable.   Original Report  Authenticated By: Janeece Riggers, M.D.      No diagnosis found.   Date: 07/31/2012  Rate: 209  Rhythm: supraventricular tachycardia (SVT)  QRS Axis: normal  Intervals: normal  ST/T Wave abnormalities: normal  Conduction Disutrbances:none  Narrative Interpretation:   Old EKG Reviewed: none available   Date: 07/31/2012  Rate: 133  Rhythm: sinus tachycardia  QRS Axis: normal  Intervals: normal  ST/T Wave abnormalities: nonspecific ST/T changes  Conduction Disutrbances:none  Narrative Interpretation: SVT resolved  Old EKG Reviewed: changes noted   CRITICAL CARE Performed by: Billee Cashing   Total critical care time: 30  Critical care time was exclusive of separately billable procedures and treating other patients.  Critical care was necessary to treat or prevent imminent or life-threatening deterioration.  Critical care was time spent personally by me on the following activities: development of treatment plan with patient and/or surrogate as well as nursing, discussions with consultants, evaluation of patient's response to treatment, examination of patient, obtaining history from patient or surrogate, ordering and performing treatments and interventions, ordering and review of laboratory studies, ordering and review of radiographic studies, pulse oximetry and re-evaluation of patient's condition.  MDM  Patient presents with shortness of breath and dyspnea. Found to be in SVT. He converted with 6 mg of adenosine. X-ray shows worsening of interstitial markings. Patient does remain tachycardic. She will get a CT angiography to evaluate for pulmonary embolism due to her cancer history. She will likely be admitted. Care turned over to Dr. Manus Gunning. Troponin elevated. May be due to SVT.        Juliet Rude. Rubin Payor, MD 07/31/12 [phone]

## 2012-07-31 NOTE — ED Notes (Signed)
Critical lab value troponin 0.43. EDP Pickering made aware.

## 2012-07-31 NOTE — Progress Notes (Signed)
Room [facility] - Rolla Etienne - [facility] RN Visit-R.Neno Hohensee RN  Related admission to [facility] diagnosis of metastatic breast cancer.   Pt is FULL code.    Pt alert & oriented, lying on ED stretcher, with complaints of breathing discomfort.  Pt states she has had increasing SOB over the past 3 days.   Son present.   Pt being evaluated in ED and no decision on admission at this time. Patient's home medication list is on shadow chart.   Please call [facility] @ [phone]- ask for RN Liaison or after hours,ask for on-call RN with any hospice needs.    Thank you.  [name], RN  [facility]  Hospice Liaison

## 2012-07-31 NOTE — Progress Notes (Signed)
ANTICOAGULATION CONSULT NOTE - Initial Consult  Pharmacy Consult for Heparin Indication: pulmonary embolus  No Known Allergies  Patient Measurements: Height: 69in Weight: 72kg Heparin Dosing Weight: 68kg  Vital Signs: Temp: 99.1 F (37.3 C) (12/23 1550) Temp src: Oral (12/23 1550) BP: 103/73 mmHg (12/23 1550) Pulse Rate: 130  (12/23 1550)  Labs:  Basename 07/31/12 1445 07/31/12 1426 07/31/12 1425  HGB 10.5* -- 9.2*  HCT 31.0* -- 27.9*  PLT -- -- 378  APTT -- -- --  LABPROT -- -- --  INR -- -- --  HEPARINUNFRC -- -- --  CREATININE 0.70 -- --  CKTOTAL -- -- --  CKMB -- -- --  TROPONINI -- 0.43* --    The CrCl is unknown because both a height and weight (above a minimum accepted value) are required for this calculation.   Medical History: Past Medical History  Diagnosis Date  . Breast lump   . Thyroid disease   . Hypertension   . Hx of radiation therapy 06/2010, 10/2009, 11/2011    L breast, T3-T12 spine, L-S spine  . 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 Breast cancer 09/2009    L breast, ER+, PR-, Her 2 -  . History of radiation therapy 10/27/11-11/15/11    lumbar/sacral spine/18MV photons    Medications:  Scheduled:    . [COMPLETED] adenosine (ADENOCARD) IV  6 mg Intravenous Once  . [COMPLETED]  HYDROmorphone (DILAUDID) injection  1 mg Intravenous Once   Infusions:    . sodium chloride 125 mL/hr at 07/31/12 1433  . heparin      Assessment:  51 YOF with metastatic breast cancer, presents to ED with shortness of breath  CT shows bilateral PE  No anticoagulants prior to admission  H/H slightly low, Pltc within normal limits   Goal of Therapy:  Heparin level 0.3-0.7 units/ml Monitor platelets by anticoagulation protocol: Yes   Plan:   Heparin 4000 units IV bolus (ordered by MD)  Heparin infusion 1200 units/hr  Check heparin level 6hrs after heparin starts  Check heparin level and CBC daily  Loralee Pacas, PharmD, BCPS Pager:  [phone] 07/31/2012,5:25 PM

## 2012-07-31 NOTE — ED Provider Notes (Signed)
Assumed care of patient from Dr. Rubin Payor at 4 PM. Patient presents with shortness of breath and tachycardia. SVT converted to sinus tachycardia. She has metastatic breast cancer but wishes to be a full code. CT scan discuss with Dr. Tyron Russell of radiology. She is bilateral distal pulmonary emboli, worsening pleural effusions, and worsening metastatic disease in her liver and lungs. Start anticoagulation. Discussed with Dr. Delford Field of critical care who wishes for hospitalist admit with critical care consult.  Kristin Octave, MD 07/31/12 2312

## 2012-07-31 NOTE — ED Notes (Signed)
EDP Pickering made aware of pt requesting pain meds.

## 2012-07-31 NOTE — Plan of Care (Signed)
Problem: ICU Phase Progression Outcomes Goal: Initial discharge plan identified Outcome: Completed/Met Date Met:  07/31/12 Pt. Is at present a Hospice pt but remains full code status at present time.

## 2012-07-31 NOTE — ED Notes (Signed)
Patient transported to CT 

## 2012-07-31 NOTE — ED Notes (Signed)
Notified Pt that urine sample is needed.  Pt will contact us when able to produce

## 2012-07-31 NOTE — Progress Notes (Signed)
ANTIBIOTIC CONSULT NOTE - INITIAL  Pharmacy Consult for Vancomycin Indication: rule out pneumonia  No Known Allergies  Patient Measurements: Height: 5' 9.5" (176.5 cm) Weight: 160 lb (72.576 kg) IBW/kg (Calculated) : 67.35   Vital Signs: Temp: 99.1 F (37.3 C) (12/23 1550) Temp src: Oral (12/23 1550) BP: 121/84 mmHg (12/23 1845) Pulse Rate: 127  (12/23 1745) Intake/Output from previous day:   Intake/Output from this shift:    Labs:  Basename 07/31/12 1445 07/31/12 1425  WBC -- 6.6  HGB 10.5* 9.2*  PLT -- 378  LABCREA -- --  CREATININE 0.70 --   Estimated Creatinine Clearance: 92.5 ml/min (by C-G formula based on Cr of 0.7).   Microbiology: Recent Results (from the past 720 hour(s))  CLOSTRIDIUM DIFFICILE BY PCR     Status: Normal   Collection Time   07/25/12 11:33 AM      Component Value Range Status Comment   C difficile by pcr Negative  Negative Final     Medical History: Past Medical History  Diagnosis Date  . Breast lump   . Thyroid disease   . Hypertension   . Hx of radiation therapy 06/2010, 10/2009, 11/2011    L breast, T3-T12 spine, L-S spine  . 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 Breast cancer 09/2009    L breast, ER+, PR-, Her 2 -  . History of radiation therapy 10/27/11-11/15/11    lumbar/sacral spine/18MV photons    Medications:  Scheduled:    . [COMPLETED] adenosine (ADENOCARD) IV  6 mg Intravenous Once  . albuterol  2.5 mg Nebulization Q4H  . anastrozole  1 mg Oral Daily  . docusate sodium  100 mg Oral Daily  . fentaNYL  50 mcg Transdermal Q72H  . [COMPLETED] heparin  4,000 Units Intravenous Once  . [COMPLETED]  HYDROmorphone (DILAUDID) injection  1 mg Intravenous Once  . [COMPLETED]  HYDROmorphone (DILAUDID) injection  1 mg Intravenous Once  . levothyroxine  150 mcg Oral QAC breakfast  . megestrol  200 mg Oral Daily  . metoCLOPramide  10 mg Oral QID  . piperacillin-tazobactam  3.375 g Intravenous Q8H  . sodium chloride  3 mL  Intravenous Q12H   Infusions:    . sodium chloride 125 mL/hr at 07/31/12 1433  . heparin 1,200 Units/hr (07/31/12 1825)   Assessment:  46 year old female with a history of stage IV breast cancer with metastases to liver and bone presents with SOB  CT shows bilateral PE, started on IV heparin drip  Now starting broad spectrum antibiotics with vancomycin and zosyn for pulmonary infiltrates in RUL  Renal function within normal limits, urine and blood cultures ordered   Goal of Therapy:  Vancomycin trough level 15-20 mcg/ml  Plan:   Begin vancomycin 1500mg  IV q12h Check trough at steady state Follow up renal function & cultures Follow up clinical progress, de-escalation of antibiotics  Loralee Pacas, PharmD, BCPS Pager: [phone] 07/31/2012,8:48 PM

## 2012-08-01 ENCOUNTER — Other Ambulatory Visit: Payer: Self-pay | Admitting: Lab

## 2012-08-01 ENCOUNTER — Ambulatory Visit: Payer: Self-pay

## 2012-08-01 DIAGNOSIS — J90 Pleural effusion, not elsewhere classified: Secondary | ICD-10-CM

## 2012-08-01 DIAGNOSIS — E039 Hypothyroidism, unspecified: Secondary | ICD-10-CM | POA: Diagnosis present

## 2012-08-01 DIAGNOSIS — I471 Supraventricular tachycardia: Secondary | ICD-10-CM | POA: Diagnosis present

## 2012-08-01 DIAGNOSIS — J910 Malignant pleural effusion: Secondary | ICD-10-CM | POA: Diagnosis present

## 2012-08-01 DIAGNOSIS — R918 Other nonspecific abnormal finding of lung field: Secondary | ICD-10-CM | POA: Diagnosis present

## 2012-08-01 DIAGNOSIS — I2699 Other pulmonary embolism without acute cor pulmonale: Secondary | ICD-10-CM | POA: Diagnosis present

## 2012-08-01 DIAGNOSIS — G8929 Other chronic pain: Secondary | ICD-10-CM | POA: Diagnosis present

## 2012-08-01 DIAGNOSIS — E876 Hypokalemia: Secondary | ICD-10-CM | POA: Diagnosis present

## 2012-08-01 DIAGNOSIS — R7989 Other specified abnormal findings of blood chemistry: Secondary | ICD-10-CM | POA: Diagnosis present

## 2012-08-01 DIAGNOSIS — D509 Iron deficiency anemia, unspecified: Secondary | ICD-10-CM | POA: Diagnosis present

## 2012-08-01 LAB — BASIC METABOLIC PANEL
BUN: 7 mg/dL (ref 6–23)
CO2: 26 mEq/L (ref 19–32)
Glucose, Bld: 106 mg/dL — ABNORMAL HIGH (ref 70–99)
Potassium: 3.4 mEq/L — ABNORMAL LOW (ref 3.5–5.1)
Sodium: 137 mEq/L (ref 135–145)

## 2012-08-01 LAB — URINE CULTURE

## 2012-08-01 LAB — IRON AND TIBC
Iron: 23 ug/dL — ABNORMAL LOW (ref 42–135)
Saturation Ratios: 18 % — ABNORMAL LOW (ref 20–55)
TIBC: 126 ug/dL — ABNORMAL LOW (ref 250–470)
UIBC: 103 ug/dL — ABNORMAL LOW (ref 125–400)

## 2012-08-01 LAB — CBC
HCT: 23.6 % — ABNORMAL LOW (ref 36.0–46.0)
Hemoglobin: 7.7 g/dL — ABNORMAL LOW (ref 12.0–15.0)
MCHC: 32.2 g/dL (ref 30.0–36.0)
MCV: 78.9 fL (ref 78.0–100.0)
RDW: 23.7 % — ABNORMAL HIGH (ref 11.5–15.5)

## 2012-08-01 LAB — TROPONIN I: Troponin I: 0.48 ng/mL (ref <0.30)

## 2012-08-01 LAB — FERRITIN: Ferritin: 453 ng/mL — ABNORMAL HIGH (ref 10–291)

## 2012-08-01 MED ORDER — POTASSIUM CHLORIDE CRYS ER 20 MEQ PO TBCR
40.0000 meq | EXTENDED_RELEASE_TABLET | Freq: Once | ORAL | Status: AC
  Administered 2012-08-01: 40 meq via ORAL
  Filled 2012-08-01: qty 2

## 2012-08-01 MED ORDER — SODIUM CHLORIDE 0.9 % IV SOLN
60.0000 mg | Freq: Once | INTRAVENOUS | Status: AC
  Administered 2012-08-01: 60 mg via INTRAVENOUS
  Filled 2012-08-01: qty 20

## 2012-08-01 MED ORDER — LORAZEPAM 0.5 MG PO TABS: 0.5000 mg | ORAL_TABLET | Freq: Three times a day (TID) | ORAL | Status: DC | PRN

## 2012-08-01 MED ORDER — LORAZEPAM 0.5 MG PO TABS
0.5000 mg | ORAL_TABLET | Freq: Three times a day (TID) | ORAL | Status: DC | PRN
  Administered 2012-08-01: 0.5 mg via ORAL
  Filled 2012-08-01 (×2): qty 1

## 2012-08-01 MED ORDER — VANCOMYCIN HCL 1000 MG IV SOLR
750.0000 mg | Freq: Three times a day (TID) | INTRAVENOUS | Status: DC
  Administered 2012-08-01 – 2012-08-02 (×3): 750 mg via INTRAVENOUS
  Filled 2012-08-01 (×5): qty 750

## 2012-08-01 MED ORDER — LORAZEPAM 0.5 MG PO TABS
0.5000 mg | ORAL_TABLET | ORAL | Status: DC | PRN
  Administered 2012-08-01 – 2012-08-02 (×2): 0.5 mg via ORAL
  Filled 2012-08-01 (×2): qty 1

## 2012-08-01 MED ORDER — VANCOMYCIN HCL 1000 MG IV SOLR
750.0000 mg | Freq: Once | INTRAVENOUS | Status: AC
  Administered 2012-08-01: 750 mg via INTRAVENOUS
  Filled 2012-08-01 (×2): qty 750

## 2012-08-01 NOTE — Progress Notes (Signed)
[facility] PROGRESS NOTE  Kristin [name] HQI:[id number] DOB: 1966-07-19 DOA: 07/31/2012 PCP: [name], MD  Brief narrative: Kristin [name] is a 46 year old woman with a past medical history of stage IV breast cancer with metastasis to liver and bone who was admitted to the hospital on 07/31/2012 with SVT (heart rate originally over 200) who was found to have bilateral pulmonary emboli on admission.  Assessment/Plan: *Principal Problem: Pulmonary embolism   Admitted and placed on IV heparin.   Will need anticoagulation long-term due to concomitant metastatic breast cancer, unless she opts for comfort care.  CCM consulted.   Echocardiogram ordered.  Oncology consulted for indefinite parenteral anticogulation --Arixtra vs. Lovenox.  Stage IV breast cancer   Followed by Dr. Darnelle Catalan.   Patient is FULL code.   Continue Arimidex. Chest discomfort / elevated troponin / SVT   Likely due to the patient's pulmonary embolus and SVT resulting in demand ischemia. Status post adenosine. Control heart rate.  EKG without any acute ST-T wave changes.  Two-dimensional echocardiogram pending.  Malignant pleural effusions   May be contributing to the patient's dyspnea.   May need consultation with IR for thoracocentesis. Pulmonary infiltrates   Noted in the right upper lobe   Difficult to differentiate infectious process versus lymphangitic tumor spread   Followup blood cultures.  Continue empiric vancomycin and Zosyn for now.  Continue nebulized bronchodilator therapy. Syncope   Likely due to the patient's SVT--->back in sinus post adenosine.  Chronic pain   Continue intravenous hydromorphone for pain control.  Continue fentanyl patch 50 mcg every 72 hours.  Microcytic anemia   Followup iron studies.  Hemoglobin drop noted over the past 24 hours. May be dilutional. Monitor. Elevated BNP  Likely from pulmonary embolism. Followup 2-D echocardiogram  results. Hypokalemia  Replete. Elevated LFTs  Likely from metastatic disease involving the liver. Hypothyroidism  Continue Synthroid.  Code Status: Full. Family Communication: Updated at bedside Disposition Plan: Home versus residential hospice.   Medical Consultants:  Dr. Ruthann Cancer, Oncology  Other Consultants:  None.  Anti-infectives:  Vancomycin 07/31/2012--->  Zosyn 07/31/2012--->  HPI/Subjective: Kristin [name] continues to be quite dyspneic. She is a moist cough. She is weak. She seems to have limited understanding of her disease process.  Objective: Filed Vitals:   08/01/12 0400 08/01/12 0500 08/01/12 0600 08/01/12 0700  BP: 112/75 117/79 110/70 122/72  Pulse: 111 124 127 121  Temp: 98.2 F (36.8 C)     TempSrc: Oral     Resp: 20 20 22 20   Height:      Weight:      SpO2: 98% 100% 100% 100%    Intake/Output Summary (Last 24 hours) at 08/01/12 0721 Last data filed at 08/01/12 0600  Gross per 24 hour  Intake   2712 ml  Output    200 ml  Net   2512 ml    Exam: Gen:  NAD Cardiovascular:  Tachycardic Respiratory:  Lungs coarse with bilateral rhonchi Gastrointestinal:  Abdomen soft, NT/ND, + BS Extremities:  1+ edema  Data Reviewed: Basic Metabolic Panel:  Lab 08/01/12 1324 07/31/12 1445 07/25/12 1124  NA 137 142 139  K 3.4* 4.0 --  CL 105 106 105  CO2 26 -- 28  GLUCOSE 106* 107* 107*  BUN 7 7 9.0  CREATININE 0.71 0.70 0.7  CALCIUM 8.3* -- 9.1  MG -- -- --  PHOS -- -- --   GFR Estimated Creatinine Clearance: 86.5 ml/min (by C-G formula based on Cr of 0.71). Liver Function  Tests:  Lab 07/25/12 1124  AST 46*  ALT 31  ALKPHOS 832*  BILITOT 1.32*  PROT 6.2*  ALBUMIN 2.2*   Coagulation profile  Lab 07/31/12 1802  INR 1.25  PROTIME --    CBC:  Lab 08/01/12 0325 07/31/12 1445 07/31/12 1425 07/25/12 1124  WBC 6.4 -- 6.6 5.2  NEUTROABS -- -- 5.0 4.0  HGB 7.7* 10.5* 9.2* 8.4*  HCT 23.6* 31.0* 27.9* 25.1*  MCV 78.9 --  77.3* 79.7  PLT 338 -- 378 372   Cardiac Enzymes:  Lab 08/01/12 0325 07/31/12 2114 07/31/12 1426  CKTOTAL -- -- --  CKMB -- -- --  CKMBINDEX -- -- --  TROPONINI 0.48* <0.30 0.43*   BNP (last 3 results)  Basename 07/31/12 1426  PROBNP 1950.0*   Microbiology Recent Results (from the past 240 hour(s))  CLOSTRIDIUM DIFFICILE BY PCR     Status: Normal   Collection Time   07/25/12 11:33 AM      Component Value Range Status Comment   C difficile by pcr Negative  Negative Final   MRSA PCR SCREENING     Status: Normal   Collection Time   07/31/12  8:33 PM      Component Value Range Status Comment   MRSA by PCR NEGATIVE  NEGATIVE Final      Procedures and Diagnostic Studies: Ct Angio Chest W/cm &/or Wo Cm  07/31/2012  *RADIOLOGY REPORT*  Clinical Data: Breathing discomfort, increasing shortness of breath, metastatic breast cancer, hypertension  CT ANGIOGRAPHY CHEST  Technique:  Multidetector CT imaging of the chest using the standard protocol during bolus administration of intravenous contrast. Multiplanar reconstructed images including MIPs were obtained and reviewed to evaluate the vascular anatomy.  Contrast: OMNIPAQUE IOHEXOL 350 MG/ML SOLN  Comparison: 05/24/2012  Findings: Aorta normal caliber without aneurysm or dissection. Metastatic lesion at medial aspect right lobe liver image 96 4.0 x 3.3 cm, previously 3.1 x 2.0 cm and lateral segment left lobe 7.0 x 4.5 cm previously 6.1 x 2.8 cm. Significant intrahepatic biliary dilatation. Question abnormal soft tissue and gastrohepatic ligament suspicious for tumor. New significant intrahepatic biliary dilatation. Thickening of the left adrenal gland and perihepatic ascites noted. Soft tissue nodule posterior to the upper pole of the left kidney 7 mm diameter unchanged.  Abnormal soft tissues identified throughout the anterior mediastinum, both hila, and subcarinal region compatible with tumor increased since previous exam. Multiple  right retro pectoral nodes, slightly increased. Question adenopathy at the bases of the cervical regions bilaterally. Narrowing of the right upper lobe bronchi at the origins. Moderate sized bilateral pleural effusions greater on left.  Multiple pulmonary emboli identified within right lower lobe pulmonary arteries. Questionable small pulmonary embolus in the left lower lobe. Compression of the right upper right lower lobe pulmonary arteries by soft tissue at the left hilum. Extensive infiltrate identified in the right upper lobe, potentially lymphangitic tumor. Increased volume loss in the upper lobes. Additional areas of potential tumor are identified in the the right middle lobe and left upper lobe. Minimal compressive atelectasis in bilateral lower lobes. Numerous osseous metastases involving thoracic vertebrae and right scapula, including a destructive lesion involving right glenoid with associated fragmentation.  IMPRESSION: Pulmonary emboli identified within right lower lobe pulmonary arteries and likely a small embolus in a left lower lobe pulmonary artery. Increased abnormal mediastinal and bilateral hilar soft tissue since previous exam compatible with tumor progression. Increased size of hepatic metastases and pulmonary tumor spread. Increased bilateral pleural effusions. Osseous metastatic  disease and right retro pectoral adenopathy.  Findings called to Dr. Manus Gunning  on 07/31/2029 at 1718 hours.   Original Report Authenticated By: Ulyses Southward, M.D.    Dg Chest Port 1 View  07/31/2012  *RADIOLOGY REPORT*  Clinical Data: Breast cancer and short of breath  PORTABLE CHEST - 1 VIEW  Comparison: CT chest 05/24/2012, chest x-ray 05/23/2012  Findings: Progression of prominent interstitial markings bilaterally especially in the right upper lobe and  right lower lobe.  Right perihilar mass lesion appears larger.  This may be due to lymphangitic spread of breast cancer.  Lung cancer not excluded.  Extensive  pleural parenchymal scarring left upper lobe is unchanged.     Heart size is normal. Increase in left effusion and left lower lobe atelectasis.  IMPRESSION: Progression of prominent interstitial markings on the right with perihilar mass lesion.  This may be due to lymphangitic spread of tumor.  Increase in left lower lobe atelectasis and left effusion.  Extensive scarring in the left apex is stable.   Original Report Authenticated By: Janeece Riggers, M.D.     Scheduled Meds:   . albuterol  2.5 mg Nebulization Q4H  . anastrozole  1 mg Oral Daily  . docusate sodium  100 mg Oral Daily  . fentaNYL  50 mcg Transdermal Q72H  . levothyroxine  150 mcg Oral QAC breakfast  . megestrol  200 mg Oral Daily  . metoCLOPramide  10 mg Oral QID  . piperacillin-tazobactam  3.375 g Intravenous Q8H  . sodium chloride  3 mL Intravenous Q12H  . vancomycin  1,500 mg Intravenous Q12H   Continuous Infusions:   . sodium chloride 1,000 mL (08/01/12 0428)  . heparin 1,200 Units/hr (07/31/12 1825)    Time spent: 35 minutes.   LOS: 1 day   Kristin [name]  [facility] Pager [phone].  If 8PM-8AM, please contact night-coverage at [email or web address], password [facility] 08/01/2012, 7:21 AM

## 2012-08-01 NOTE — Progress Notes (Signed)
Room 12234 -Kristin [name] - [facility] RN Visit-R.[name] RN  Related admission to [facility] diagnosis of metastatic breast cancer.  Pt is FULL code.    Pt alert & oriented, sitting up in bed, without complaints of pain or discomfort.  Pt appears to be breathing much more comfortably today- sitting up eating breakfast. Son Kristin [name] present.  Per Dr. Darrall Dears note - consider home vs BP - [facility] SW will follow up. Patient's home medication list is on shadow chart.   Please call [facility] @ [phone]- ask for RN Liaison or after hours,ask for on-call RN with any hospice needs.    Thank you.  [name], RN  [facility]  Hospice Liaison

## 2012-08-01 NOTE — Progress Notes (Signed)
ANTICOAGULATION CONSULT NOTE - F/U Consult  Pharmacy Consult for Heparin Indication: pulmonary embolus  No Known Allergies  Patient Measurements: Height: 69in Weight: 72kg Heparin Dosing Weight: 68kg  Vital Signs: Temp: 98.9 F (37.2 C) (12/24 0000) Temp src: Oral (12/24 0000) BP: 117/64 mmHg (12/24 0300) Pulse Rate: 123  (12/24 0300)  Labs:  Basename 08/01/12 0325 07/31/12 2114 07/31/12 1802 07/31/12 1445 07/31/12 1426 07/31/12 1425  HGB 7.7* -- -- 10.5* -- --  HCT 23.6* -- -- 31.0* -- 27.9*  PLT 338 -- -- -- -- 378  APTT -- -- 25 -- -- --  LABPROT -- -- 15.5* -- -- --  INR -- -- 1.25 -- -- --  HEPARINUNFRC 0.46 -- -- -- -- --  CREATININE -- -- -- 0.70 -- --  CKTOTAL -- -- -- -- -- --  CKMB -- -- -- -- -- --  TROPONINI -- <0.30 -- -- 0.43* --    Estimated Creatinine Clearance: 86.5 ml/min (by C-G formula based on Cr of 0.7).   Medical History: Past Medical History  Diagnosis Date  . Breast lump   . Thyroid disease   . Hypertension   . Hx of radiation therapy 06/2010, 10/2009, 11/2011    L breast, T3-T12 spine, L-S spine  . 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 Breast cancer 09/2009    L breast, ER+, PR-, Her 2 -  . History of radiation therapy 10/27/11-11/15/11    lumbar/sacral spine/18MV photons    Medications:  Scheduled:     . [COMPLETED] adenosine (ADENOCARD) IV  6 mg Intravenous Once  . albuterol  2.5 mg Nebulization Q4H  . anastrozole  1 mg Oral Daily  . docusate sodium  100 mg Oral Daily  . fentaNYL  50 mcg Transdermal Q72H  . [COMPLETED] heparin  4,000 Units Intravenous Once  . [COMPLETED]  HYDROmorphone (DILAUDID) injection  1 mg Intravenous Once  . [COMPLETED]  HYDROmorphone (DILAUDID) injection  1 mg Intravenous Once  . levothyroxine  150 mcg Oral QAC breakfast  . megestrol  200 mg Oral Daily  . metoCLOPramide  10 mg Oral QID  . piperacillin-tazobactam  3.375 g Intravenous Q8H  . sodium chloride  3 mL Intravenous Q12H  . vancomycin  1,500 mg  Intravenous Q12H   Infusions:     . sodium chloride 125 mL/hr at 07/31/12 2000  . heparin 1,200 Units/hr (07/31/12 1825)    Assessment:  41 YOF with metastatic breast cancer, presents to ED with shortness of breath  CT shows bilateral PE  No anticoagulants prior to admission  H/H slightly low, Pltc within normal limits  1st HL = 0.46.  No problems reported per RN.   Goal of Therapy:  Heparin level 0.3-0.7 units/ml Monitor platelets by anticoagulation protocol: Yes   Plan:  Continue Heparin infusion @  1200 units/hr  Recheck HL later today.   Daily CBC/Heparin level   Susanne Greenhouse R] 08/01/2012 08/01/2012,3:59 AM

## 2012-08-01 NOTE — Progress Notes (Signed)
  Echocardiogram 2D Echocardiogram has been performed.  Domique Clapper 08/01/2012, 11:14 AM

## 2012-08-01 NOTE — Progress Notes (Signed)
ANTICOAGULATION CONSULT NOTE - F/U Consult  Pharmacy Consult for Heparin Indication: pulmonary embolus  No Known Allergies  Patient Measurements: Height: 69in Weight: 72kg Heparin Dosing Weight: 68kg  Vital Signs: Temp: 99.1 F (37.3 C) (12/24 0800) Temp src: Oral (12/24 0800) BP: 121/81 mmHg (12/24 0800) Pulse Rate: 124  (12/24 0800)  Labs:  Basename 08/01/12 0946 08/01/12 0325 07/31/12 2114 07/31/12 1802 07/31/12 1445 07/31/12 1426 07/31/12 1425  HGB -- 7.7* -- -- 10.5* -- --  HCT -- 23.6* -- -- 31.0* -- 27.9*  PLT -- 338 -- -- -- -- 378  APTT -- -- -- 25 -- -- --  LABPROT -- -- -- 15.5* -- -- --  INR -- -- -- 1.25 -- -- --  HEPARINUNFRC 0.41 0.46 -- -- -- -- --  CREATININE -- 0.71 -- -- 0.70 -- --  CKTOTAL -- -- -- -- -- -- --  CKMB -- -- -- -- -- -- --  TROPONINI -- 0.48* <0.30 -- -- 0.43* --    Estimated Creatinine Clearance: 86.5 ml/min (by C-G formula based on Cr of 0.71).   Medical History: Past Medical History  Diagnosis Date  . Breast lump   . Thyroid disease   . Hypertension   . Hx of radiation therapy 06/2010, 10/2009, 11/2011    L breast, T3-T12 spine, L-S spine  . 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 Breast cancer 09/2009    L breast, ER+, PR-, Her 2 -  . History of radiation therapy 10/27/11-11/15/11    lumbar/sacral spine/18MV photons    Medications:  Scheduled:     . [COMPLETED] adenosine (ADENOCARD) IV  6 mg Intravenous Once  . albuterol  2.5 mg Nebulization Q4H  . anastrozole  1 mg Oral Daily  . docusate sodium  100 mg Oral Daily  . fentaNYL  50 mcg Transdermal Q72H  . [COMPLETED] heparin  4,000 Units Intravenous Once  . [COMPLETED]  HYDROmorphone (DILAUDID) injection  1 mg Intravenous Once  . [COMPLETED]  HYDROmorphone (DILAUDID) injection  1 mg Intravenous Once  . levothyroxine  150 mcg Oral QAC breakfast  . megestrol  200 mg Oral Daily  . metoCLOPramide  10 mg Oral QID  . pamidronate  60 mg Intravenous Once  .  piperacillin-tazobactam  3.375 g Intravenous Q8H  . [COMPLETED] potassium chloride  40 mEq Oral Once  . sodium chloride  3 mL Intravenous Q12H  . vancomycin  1,500 mg Intravenous Q12H   Infusions:     . sodium chloride 1,000 mL (08/01/12 0428)  . heparin 1,200 Units/hr (07/31/12 1825)    Assessment:  89 YOF with metastatic breast cancer, presents to ED with shortness of breath  CT shows bilateral PE  No anticoagulants prior to admission; Baseline INR 1.25  H/H  Decreased 7.7, Plt WNL.  No bleeding/complications noted, will monitor  Second heparin level remains therapeutic  *Note patient states that she no longer wants heparin levels drawn, Her next heparin level is not scheduled until 12/25 with AM labs, RN to re-assess willingness for labs, may need to consider alternative form of anticoagulation if refuses labs.   Goal of Therapy:  Heparin level 0.3-0.7 units/ml Monitor platelets by anticoagulation protocol: Yes   Plan:  Continue Heparin infusion @  1200 units/hr   Daily CBC/Heparin level  Educate patient on the importance of heparin levels  Will f/u plans for long term anticoagulation   Joyceline Maiorino, Loma Messing PharmD Pager #: [phone] 10:23 AM 08/01/2012

## 2012-08-01 NOTE — Progress Notes (Signed)
Pt's desire is to return home once she is ready for discharge.  Pt's mother lives in the home with her as well as her adult son and adult nephew.  Hospice will continue to provide ongoing services.  Wynonia Hazard, LCSW

## 2012-08-01 NOTE — Progress Notes (Signed)
Kristin [name]   DOB:23-May-1966   ZO#:[id number]   [id number]#:[id number]  Subjective: patient in bed, son Orlie Pollen in room; patient tells me her breathing is better; cough productive of clear phlegm; denies pain and specifically no pleurisy or hemoptysis; was SOB at home about 3 days, did not think to call hospice RN, who came by for a routine visit and called [place] concerned re the patient's pulmonary problems; most recent BM yesterday; patient reminds me her zoledronic acid is due (rescheduled per pt x2)  Objective: middle aged [place] woman examined in bed Filed Vitals:   08/01/12 0800  BP: 121/81  Pulse: 124  Temp: 99.1 F (37.3 C)  Resp: 26    Body mass index is 20.22 kg/(m^2).  Intake/Output Summary (Last 24 hours) at 08/01/12 0854 Last data filed at 08/01/12 0800  Gross per 24 hour  Intake   2999 ml  Output    500 ml  Net   2499 ml     Sclerae unicteric  Oropharynx no thrush  Lungs show bilateral coarse rales and rhonchi, poor excursion bilaterally  Heart regular rate and rhythm  Abdomen soft, NT, +BS  Neuro nonfocal, A&O x3, positive affect  Breast exam: deferred  CBG (last 3)  No results found for this basename: GLUCAP:3 in the last 72 hours   Labs:  Lab Results  Component Value Date   WBC 6.4 08/01/2012   HGB 7.7* 08/01/2012   HCT 23.6* 08/01/2012   MCV 78.9 08/01/2012   PLT 338 08/01/2012   NEUTROABS 5.0 07/31/2012    @LASTCHEMISTRY @  Urine Studies No results found for this basename: UACOL:2,UAPR:2,USPG:2,UPH:2,UTP:2,UGL:2,UKET:2,UBIL:2,UHGB:2,UNIT:2,UROB:2,ULEU:2,UEPI:2,UWBC:2,URBC:2,UBAC:2,CAST:2,CRYS:2,UCOM:2,BILUA:2 in the last 72 hours  Basic Metabolic Panel:  Lab 08/01/12 6213 07/31/12 1445 07/25/12 1124  NA 137 142 139  K 3.4* 4.0 --  CL 105 106 105  CO2 26 -- 28  GLUCOSE 106* 107* 107*  BUN 7 7 9.0  CREATININE 0.71 0.70 0.7  CALCIUM 8.3* -- 9.1  MG -- -- --  PHOS -- -- --   GFR Estimated Creatinine Clearance: 86.5 ml/min (by C-G formula  based on Cr of 0.71). Liver Function Tests:  Lab 07/25/12 1124  AST 46*  ALT 31  ALKPHOS 832*  BILITOT 1.32*  PROT 6.2*  ALBUMIN 2.2*   No results found for this basename: LIPASE:5,AMYLASE:5 in the last 168 hours No results found for this basename: AMMONIA:5 in the last 168 hours Coagulation profile  Lab 07/31/12 1802  INR 1.25  PROTIME --    CBC:  Lab 08/01/12 0325 07/31/12 1445 07/31/12 1425 07/25/12 1124  WBC 6.4 -- 6.6 5.2  NEUTROABS -- -- 5.0 4.0  HGB 7.7* 10.5* 9.2* 8.4*  HCT 23.6* 31.0* 27.9* 25.1*  MCV 78.9 -- 77.3* 79.7  PLT 338 -- 378 372   Cardiac Enzymes:  Lab 08/01/12 0325 07/31/12 2114 07/31/12 1426  CKTOTAL -- -- --  CKMB -- -- --  CKMBINDEX -- -- --  TROPONINI 0.48* <0.30 0.43*   BNP: No components found with this basename: POCBNP:5 CBG: No results found for this basename: GLUCAP:5 in the last 168 hours D-Dimer No results found for this basename: DDIMER:2 in the last 72 hours Hgb A1c No results found for this basename: HGBA1C:2 in the last 72 hours Lipid Profile No results found for this basename: CHOL:2,HDL:2,LDLCALC:2,TRIG:2,CHOLHDL:2,LDLDIRECT:2 in the last 72 hours Thyroid function studies No results found for this basename: TSH,T4TOTAL,FREET3,T3FREE,THYROIDAB in the last 72 hours Anemia work up No results found for this basename: VITAMINB12:2,FOLATE:2,FERRITIN:2,TIBC:2,IRON:2,RETICCTPCT:2 in the last  72 hours Microbiology Recent Results (from the past 240 hour(s))  CLOSTRIDIUM DIFFICILE BY PCR     Status: Normal   Collection Time   07/25/12 11:33 AM      Component Value Range Status Comment   C difficile by pcr Negative  Negative Final   CULTURE, BLOOD (ROUTINE X 2)     Status: Normal (Preliminary result)   Collection Time   07/31/12  2:26 PM      Component Value Range Status Comment   Specimen Description BLOOD LEFT ARM   Final    Special Requests BOTTLES DRAWN AEROBIC AND ANAEROBIC 5CC   Final    Culture  Setup Time 07/31/2012  21:42   Final    Culture     Final    Value:        BLOOD CULTURE RECEIVED NO GROWTH TO DATE CULTURE WILL BE HELD FOR 5 DAYS BEFORE ISSUING A FINAL NEGATIVE REPORT   Report Status PENDING   Incomplete   CULTURE, BLOOD (ROUTINE X 2)     Status: Normal (Preliminary result)   Collection Time   07/31/12  3:03 PM      Component Value Range Status Comment   Specimen Description BLOOD RIGHT HAND   Final    Special Requests BOTTLES DRAWN AEROBIC ONLY 5CC   Final    Culture  Setup Time 07/31/2012 21:42   Final    Culture     Final    Value:        BLOOD CULTURE RECEIVED NO GROWTH TO DATE CULTURE WILL BE HELD FOR 5 DAYS BEFORE ISSUING A FINAL NEGATIVE REPORT   Report Status PENDING   Incomplete   MRSA PCR SCREENING     Status: Normal   Collection Time   07/31/12  8:33 PM      Component Value Range Status Comment   MRSA by PCR NEGATIVE  NEGATIVE Final       Studies:  Ct Angio Chest W/cm &/or Wo Cm  07/31/2012  *RADIOLOGY REPORT*  Clinical Data: Breathing discomfort, increasing shortness of breath, metastatic breast cancer, hypertension  CT ANGIOGRAPHY CHEST  Technique:  Multidetector CT imaging of the chest using the standard protocol during bolus administration of intravenous contrast. Multiplanar reconstructed images including MIPs were obtained and reviewed to evaluate the vascular anatomy.  Contrast: OMNIPAQUE IOHEXOL 350 MG/ML SOLN  Comparison: 05/24/2012  Findings: Aorta normal caliber without aneurysm or dissection. Metastatic lesion at medial aspect right lobe liver image 96 4.0 x 3.3 cm, previously 3.1 x 2.0 cm and lateral segment left lobe 7.0 x 4.5 cm previously 6.1 x 2.8 cm. Significant intrahepatic biliary dilatation. Question abnormal soft tissue and gastrohepatic ligament suspicious for tumor. New significant intrahepatic biliary dilatation. Thickening of the left adrenal gland and perihepatic ascites noted. Soft tissue nodule posterior to the upper pole of the left kidney 7 mm  diameter unchanged.  Abnormal soft tissues identified throughout the anterior mediastinum, both hila, and subcarinal region compatible with tumor increased since previous exam. Multiple right retro pectoral nodes, slightly increased. Question adenopathy at the bases of the cervical regions bilaterally. Narrowing of the right upper lobe bronchi at the origins. Moderate sized bilateral pleural effusions greater on left.  Multiple pulmonary emboli identified within right lower lobe pulmonary arteries. Questionable small pulmonary embolus in the left lower lobe. Compression of the right upper right lower lobe pulmonary arteries by soft tissue at the left hilum. Extensive infiltrate identified in the right upper lobe, potentially lymphangitic  tumor. Increased volume loss in the upper lobes. Additional areas of potential tumor are identified in the the right middle lobe and left upper lobe. Minimal compressive atelectasis in bilateral lower lobes. Numerous osseous metastases involving thoracic vertebrae and right scapula, including a destructive lesion involving right glenoid with associated fragmentation.  IMPRESSION: Pulmonary emboli identified within right lower lobe pulmonary arteries and likely a small embolus in a left lower lobe pulmonary artery. Increased abnormal mediastinal and bilateral hilar soft tissue since previous exam compatible with tumor progression. Increased size of hepatic metastases and pulmonary tumor spread. Increased bilateral pleural effusions. Osseous metastatic disease and right retro pectoral adenopathy.  Findings called to Dr. Manus Gunning  on 07/31/2029 at 1718 hours.   Original Report Authenticated By: Ulyses Southward, M.D.    Dg Chest Port 1 View  07/31/2012  *RADIOLOGY REPORT*  Clinical Data: Breast cancer and short of breath  PORTABLE CHEST - 1 VIEW  Comparison: CT chest 05/24/2012, chest x-ray 05/23/2012  Findings: Progression of prominent interstitial markings bilaterally especially in the  right upper lobe and  right lower lobe.  Right perihilar mass lesion appears larger.  This may be due to lymphangitic spread of breast cancer.  Lung cancer not excluded.  Extensive pleural parenchymal scarring left upper lobe is unchanged.     Heart size is normal. Increase in left effusion and left lower lobe atelectasis.  IMPRESSION: Progression of prominent interstitial markings on the right with perihilar mass lesion.  This may be due to lymphangitic spread of tumor.  Increase in left lower lobe atelectasis and left effusion.  Extensive scarring in the left apex is stable.   Original Report Authenticated By: Janeece Riggers, M.D.     Assessment: 46 y.o. 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  (3) status post Left lumpectomy and sentinel lymph node biopsy August 2011 for a 2-mm focus of residual invasive ductal carcinoma, grade 2, negative sentinel lymph node  (4) tamoxifen started July 2011, with evidence of progression March 2013  (5) status post radiation to Left breast completed November 2011  (6) s/p radiation to the lower spine, completed 11/15/2011  (7) fulvestrant/goserelin started 11/03/2011, with evidence of progression in September 2013  (8) status post palliative radiation to the right hip in September 2013  (9) Continuing goserelin (most recent dose 07/10/2012) and adding anastrozole, beginning 05/09/2012  (10) continuing on zolendronic acid monthly; last dose 06/27/2012  Plan: greatly appreciate hospitalist's care of this patient!   (a) advanced directives: I discussed my recommendation that Joseline have a DNR order in place; she is very clear she wants "everything done" even in case of a  terminal event. This has been the patient's and family's consistent desire. She had a palliative care consult to further discuss this last admission with same conclusion. Accordingly the patient remains full code. This disqualifies her for transfer to [facility], though not to a SNF (b) patient and family are also very clear she wants to go home, not to a SNF. Son Orlie Pollen tells me he is home just about all the time and the patient tells me her mother also comes and helps. Plan accordingly will be for discharge to home when patient stable. (c) as far as anticoagulation is  concerned, options include warfarin (hard to monitor in this setting and inferior in effectiveness); daily lovenox (expensive and difficult for this family to administer) and Xarelto/rivaroxaban (expensive but easy to administer and requires no monitoring). My suggestion is to see if we can obtain Xarelto for this patient--Hospice is not responsible for this medication as her PE's are not her hospice-defining illness. I will ask our SW to explore this option (d) I will write for pamidronate today; otherwise the plan is to continue the goserelin and anastrozole as outpatient.  I will be out of town until January 2d. Please contact my partners as needed before then.   MAGRINAT,GUSTAV C 08/01/2012

## 2012-08-01 NOTE — Clinical Documentation Improvement (Signed)
Abnormal Labs Clarification  THIS DOCUMENT IS NOT A PERMANENT PART OF THE MEDICAL RECORD  TO RESPOND TO THE THIS QUERY, FOLLOW THE INSTRUCTIONS BELOW:  1. If needed, update documentation for the patient's encounter via the notes activity.  2. Access this query again and click edit on the [employer].  3. After updating, or not, click F2 to complete all highlighted (required) fields concerning your review. Select "additional documentation in the medical record" OR "no additional documentation provided".  4. Click Sign note button.  5. The deficiency will fall out of your InBasket *Please let us know if you are not able to complete this workflow by phone or e-mail (listed below).  Please update your documentation within the medical record to reflect your response to this query.                                                                                   08/01/12  Dear Dr. Darnelle Catalan, Kristin [name]  In a better effort to capture your patient's severity of illness, reflect appropriate length of stay and utilization of resources, a review of the medical record has revealed the following indicators.    Based on your clinical judgment, please clarify and document in a progress note and/or discharge summary the clinical condition associated with the following supporting information:  In responding to this query please exercise your independent judgment.  The fact that a query is asked, does not imply that any particular answer is desired or expected.  Abnormal findings (laboratory, x-ray, pathologic, and other diagnostic results) are not coded and reported unless the physician indicates their clinical significance.   The medical record reflects the following clinical findings, please clarify the diagnostic and/or clinical significance:      Based on your clinical judgment can you provide a diagnosis that represents the below listed clinical indicators?  In this patient admitted with  Pulmonary [facility]obolus a review of the medical record reveals the following:   Pt with SVT  C/o chest pain  BNP elevated=1950  Malignant Pleural Effusion  Elevated troponin=0.43 and (0.48 per nurse note on 08/01/12)  Treatment: Monitoring adenosine (ADENOCARD) 6 MG/2ML  2 D Echo ordered   Clarification Needed   Please clarify the underlying diagnosis responsible for abnormal lab values in setting of PE and SVT and document in pn or d/c summary.  Possible Clinical Conditions?                                   Supporting Information:  NSTEMI  CHF (please note acuity and type)  Other Condition___________________                  Cannot Clinically Determine_________   Risk Factors:  Sign/Symptoms  Diagnostics Component     Latest Ref Rng 07/31/2012         2:26 PM  Pro B Natriuretic peptide (BNP)     0 - 125 pg/mL 1950.0 (H)   EKGs Dec 23-24) SVT  Treatment: ____________________ Reviewed: additional documentation in the medical record   Thank You,  Enis Slipper  RN, BSN,  MSN/Inf, CCDS Clinical Documentation Specialist [facility] Dept Pager: [phone] / E-mail: [email or web address]  Health Information Management [place]

## 2012-08-01 NOTE — Progress Notes (Signed)
Utilization review completed.  

## 2012-08-01 NOTE — Progress Notes (Signed)
CRITICAL VALUE ALERT  Critical value received:  Troponin I : 0.48  Date of notification:  08/01/12  Time of notification:  0405  Critical value read back: yes  Nurse who received alert:  Olegario Messier A. Cristy Friedlander RN CCRN  MD notified (1st page):  Dr. Sung Amabile  Time of first page:  0405  MD notified (2nd page):N/A  Time of second page:N/A  Responding MD:  Dr. Sung Amabile  Time MD responded:  [phone]

## 2012-08-02 DIAGNOSIS — I248 Other forms of acute ischemic heart disease: Secondary | ICD-10-CM | POA: Diagnosis present

## 2012-08-02 DIAGNOSIS — I214 Non-ST elevation (NSTEMI) myocardial infarction: Secondary | ICD-10-CM | POA: Diagnosis present

## 2012-08-02 DIAGNOSIS — F410 Panic disorder [episodic paroxysmal anxiety] without agoraphobia: Secondary | ICD-10-CM | POA: Diagnosis present

## 2012-08-02 LAB — CBC
HCT: 23.9 % — ABNORMAL LOW (ref 36.0–46.0)
MCH: 25.4 pg — ABNORMAL LOW (ref 26.0–34.0)
MCHC: 32.2 g/dL (ref 30.0–36.0)
MCV: 78.9 fL (ref 78.0–100.0)
Platelets: 320 10*3/uL (ref 150–400)
RDW: 23.4 % — ABNORMAL HIGH (ref 11.5–15.5)
WBC: 5.9 10*3/uL (ref 4.0–10.5)

## 2012-08-02 LAB — BASIC METABOLIC PANEL
BUN: 7 mg/dL (ref 6–23)
Calcium: 8.2 mg/dL — ABNORMAL LOW (ref 8.4–10.5)
Chloride: 110 mEq/L (ref 96–112)
Creatinine, Ser: 0.65 mg/dL (ref 0.50–1.10)
GFR calc Af Amer: >90 mL/min (ref >90)

## 2012-08-02 MED ORDER — METOPROLOL TARTRATE 1 MG/ML IV SOLN
2.5000 mg | Freq: Four times a day (QID) | INTRAVENOUS | Status: DC | PRN
Start: 2012-08-02 — End: 2012-08-03
  Administered 2012-08-02: 2.5 mg via INTRAVENOUS
  Filled 2012-08-02: qty 5

## 2012-08-02 MED ORDER — FUROSEMIDE 10 MG/ML IJ SOLN
40.0000 mg | Freq: Once | INTRAMUSCULAR | Status: AC
  Administered 2012-08-02: 40 mg via INTRAVENOUS
  Filled 2012-08-02: qty 4

## 2012-08-02 MED ORDER — VANCOMYCIN HCL 500 MG IV SOLR
500.0000 mg | Freq: Three times a day (TID) | INTRAVENOUS | Status: DC
  Administered 2012-08-02 – 2012-08-07 (×14): 500 mg via INTRAVENOUS
  Filled 2012-08-02 (×16): qty 500

## 2012-08-02 MED ORDER — ENOXAPARIN SODIUM 80 MG/0.8ML ~~LOC~~ SOLN
1.0000 mg/kg | Freq: Two times a day (BID) | SUBCUTANEOUS | Status: DC
  Administered 2012-08-02 – 2012-08-05 (×8): 65 mg via SUBCUTANEOUS
  Administered 2012-08-06: 19:00:00 via SUBCUTANEOUS
  Administered 2012-08-06 – 2012-08-07 (×3): 65 mg via SUBCUTANEOUS
  Filled 2012-08-02 (×17): qty 0.8

## 2012-08-02 MED ORDER — POTASSIUM CHLORIDE IN NACL 40-0.9 MEQ/L-% IV SOLN
INTRAVENOUS | Status: DC
  Administered 2012-08-02: 08:00:00 via INTRAVENOUS
  Administered 2012-08-02: 10 mL/h via INTRAVENOUS
  Administered 2012-08-06: 20 mL/h via INTRAVENOUS
  Filled 2012-08-02 (×5): qty 1000

## 2012-08-02 MED ORDER — LORAZEPAM 2 MG/ML IJ SOLN
0.5000 mg | INTRAMUSCULAR | Status: DC | PRN
  Administered 2012-08-02 – 2012-08-08 (×15): 0.5 mg via INTRAVENOUS
  Filled 2012-08-02 (×15): qty 1

## 2012-08-02 MED ORDER — POTASSIUM CHLORIDE CRYS ER 20 MEQ PO TBCR
20.0000 meq | EXTENDED_RELEASE_TABLET | Freq: Two times a day (BID) | ORAL | Status: DC
  Administered 2012-08-02 – 2012-08-08 (×14): 20 meq via ORAL
  Filled 2012-08-02 (×15): qty 1

## 2012-08-02 MED ORDER — POTASSIUM CHLORIDE CRYS ER 20 MEQ PO TBCR
40.0000 meq | EXTENDED_RELEASE_TABLET | Freq: Once | ORAL | Status: AC
  Administered 2012-08-02: 40 meq via ORAL
  Filled 2012-08-02: qty 2

## 2012-08-02 NOTE — Progress Notes (Signed)
[facility] PROGRESS NOTE  Kristin [name] ZOX:[id number] DOB: [birth year] DOA: 07/31/2012 PCP: [name], MD  Brief narrative: Kristin [name] is a 46 year old woman with a past medical history of stage IV breast cancer with metastasis to liver and bone who was admitted to the hospital on 07/31/2012 with SVT (heart rate originally over 200) who was found to have bilateral pulmonary emboli on admission.  Assessment/Plan: *Principal Problem: Pulmonary embolism   Admitted and placed on IV heparin. Refusing blood draws for heparin monitoring so will switch to SQ Lovenox.  Will need anticoagulation long-term due to concomitant metastatic breast cancer, unless she opts for comfort care.  Echocardiogram done 08/01/12.  EF 60-65%, no evidence of RV pressure overload.  PEs are very small on imaging.  Seen by Dr. Darnelle Catalan 08/01/12.  Long-term anticoagulation may pose financial challenges since hospice not likely to cover newer anticoagulants that require little monitoring.  Stage IV breast cancer   Followed by Dr. Darnelle Catalan.   Patient remains a FULL code despite recommendations otherwise by Dr. Darnelle Catalan, who spoke with her about her CODE STATUS 08/01/12.   Continue Arimidex. Chest discomfort / elevated troponin / SVT / demand ischemia / NSTEMI  Likely due to the patient's pulmonary embolus and SVT resulting in demand ischemia. Status post adenosine. Control heart rate.  EKG without any acute ST-T wave changes.  No wall motion abnormalities on 2-D Echo.  Malignant pleural effusions   May be contributing to the patient's dyspnea.   May need consultation with IR for thoracocentesis or a PleurX catheter. Pulmonary infiltrates   Noted in the right upper lobe   Difficult to differentiate infectious process versus lymphangitic tumor spread   Followup final blood cultures which are negative to date.  Continue empiric vancomycin and Zosyn for now.  Continue nebulized  bronchodilator therapy. Syncope   Likely due to the patient's SVT--->back in sinus post adenosine.  Chronic pain   Continue intravenous hydromorphone for pain control.  Continue fentanyl patch 50 mcg every 72 hours.  Microcytic anemia   Ferritin 453, low iron at 23, TIBC 126, saturation ration 18, UIBC 103.  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  Hemoglobin drop noted but stable over the past 24 hours. Likely dilutional. Monitor. Elevated BNP  Likely from pulmonary embolism. No evidence of CHF on Echo.  Either represents volume overload or PE.  We'll KVO IV fluids and give a dose of Lasix Hypokalemia  Replete orally. Elevated LFTs  Likely from metastatic disease involving the liver. Hypothyroidism  Continue Synthroid. Anxiety/panic attack  Having frequent panic attacks and significant anxiety. Seems to be triggered by breathing treatments. Will change Ativan to IV route for better symptom control.  Code Status: Full. Family Communication: None at bedside Disposition Plan: Home versus residential hospice.   Medical Consultants:  Dr. Ruthann Cancer, Oncology  Other Consultants:  None.  Anti-infectives:  Vancomycin 07/31/2012--->  Zosyn 07/31/2012--->  HPI/Subjective: Kristin [name] was having a panic attack when I visited her this morning. She was extremely dyspneic. Unable to eat. Continues to have a cough, moist.  Objective: Filed Vitals:   08/02/12 0400 08/02/12 0409 08/02/12 0500 08/02/12 0600  BP: 116/89  129/88 111/84  Pulse: 117  120 116  Temp:  98.4 F (36.9 C)    TempSrc:  Oral    Resp: 19  20 17   Height:      Weight:  63 kg (138 lb 14.2 oz)    SpO2: 100% 100% 100% 100%    Intake/Output Summary (  Last 24 hours) at 08/02/12 4098 Last data filed at 08/02/12 1191  Gross per 24 hour  Intake   3845 ml  Output    300 ml  Net   3545 ml    Exam: Gen:  NAD Cardiovascular:  Tachycardic Respiratory:  Lungs coarse with bilateral rhonchi,  tachypneic Gastrointestinal:  Abdomen soft, NT/ND, + BS Extremities:  1+ edema  Data Reviewed: Basic Metabolic Panel:  Lab 08/02/12 4782 08/01/12 0325 07/31/12 1445  NA 140 137 142  K 3.1* 3.4* --  CL 110 105 106  CO2 23 26 --  GLUCOSE 141* 106* 107*  BUN 7 7 7   CREATININE 0.65 0.71 0.70  CALCIUM 8.2* 8.3* --  MG -- -- --  PHOS -- -- --   GFR Estimated Creatinine Clearance: 86.5 ml/min (by C-G formula based on Cr of 0.65). Coagulation profile  Lab 07/31/12 1802  INR 1.25  PROTIME --    CBC:  Lab 08/02/12 0400 08/01/12 0325 07/31/12 1445 07/31/12 1425  WBC 5.9 6.4 -- 6.6  NEUTROABS -- -- -- 5.0  HGB 7.7* 7.7* 10.5* 9.2*  HCT 23.9* 23.6* 31.0* 27.9*  MCV 78.9 78.9 -- 77.3*  PLT 320 338 -- 378   Cardiac Enzymes:  Lab 08/01/12 0325 07/31/12 2114 07/31/12 1426  CKTOTAL -- -- --  CKMB -- -- --  CKMBINDEX -- -- --  TROPONINI 0.48* <0.30 0.43*   BNP (last 3 results)  Basename 07/31/12 1426  PROBNP 1950.0*   Microbiology Recent Results (from the past 240 hour(s))  CLOSTRIDIUM DIFFICILE BY PCR     Status: Normal   Collection Time   07/25/12 11:33 AM      Component Value Range Status Comment   C difficile by pcr Negative  Negative Final   CULTURE, BLOOD (ROUTINE X 2)     Status: Normal (Preliminary result)   Collection Time   07/31/12  2:26 PM      Component Value Range Status Comment   Specimen Description BLOOD LEFT ARM   Final    Special Requests BOTTLES DRAWN AEROBIC AND ANAEROBIC 5CC   Final    Culture  Setup Time 07/31/2012 21:42   Final    Culture     Final    Value:        BLOOD CULTURE RECEIVED NO GROWTH TO DATE CULTURE WILL BE HELD FOR 5 DAYS BEFORE ISSUING A FINAL NEGATIVE REPORT   Report Status PENDING   Incomplete   CULTURE, BLOOD (ROUTINE X 2)     Status: Normal (Preliminary result)   Collection Time   07/31/12  3:03 PM      Component Value Range Status Comment   Specimen Description BLOOD RIGHT HAND   Final    Special Requests BOTTLES DRAWN  AEROBIC ONLY 5CC   Final    Culture  Setup Time 07/31/2012 21:42   Final    Culture     Final    Value:        BLOOD CULTURE RECEIVED NO GROWTH TO DATE CULTURE WILL BE HELD FOR 5 DAYS BEFORE ISSUING A FINAL NEGATIVE REPORT   Report Status PENDING   Incomplete   URINE CULTURE     Status: Normal   Collection Time   07/31/12  5:56 PM      Component Value Range Status Comment   Specimen Description URINE, RANDOM   Final    Special Requests NONE   Final    Culture  Setup Time 08/01/2012 01:59  Final    Colony Count 80,000 COLONIES/ML   Final    Culture     Final    Value: Multiple bacterial morphotypes present, none predominant. Suggest appropriate recollection if clinically indicated.   Report Status 08/01/2012 FINAL   Final   MRSA PCR SCREENING     Status: Normal   Collection Time   07/31/12  8:33 PM      Component Value Range Status Comment   MRSA by PCR NEGATIVE  NEGATIVE Final      Procedures and Diagnostic Studies: Ct Angio Chest W/cm &/or Wo Cm  07/31/2012  *RADIOLOGY REPORT*  Clinical Data: Breathing discomfort, increasing shortness of breath, metastatic breast cancer, hypertension  CT ANGIOGRAPHY CHEST  Technique:  Multidetector CT imaging of the chest using the standard protocol during bolus administration of intravenous contrast. Multiplanar reconstructed images including MIPs were obtained and reviewed to evaluate the vascular anatomy.  Contrast: OMNIPAQUE IOHEXOL 350 MG/ML SOLN  Comparison: 05/24/2012  Findings: Aorta normal caliber without aneurysm or dissection. Metastatic lesion at medial aspect right lobe liver image 96 4.0 x 3.3 cm, previously 3.1 x 2.0 cm and lateral segment left lobe 7.0 x 4.5 cm previously 6.1 x 2.8 cm. Significant intrahepatic biliary dilatation. Question abnormal soft tissue and gastrohepatic ligament suspicious for tumor. New significant intrahepatic biliary dilatation. Thickening of the left adrenal gland and perihepatic ascites noted. Soft  tissue nodule posterior to the upper pole of the left kidney 7 mm diameter unchanged.  Abnormal soft tissues identified throughout the anterior mediastinum, both hila, and subcarinal region compatible with tumor increased since previous exam. Multiple right retro pectoral nodes, slightly increased. Question adenopathy at the bases of the cervical regions bilaterally. Narrowing of the right upper lobe bronchi at the origins. Moderate sized bilateral pleural effusions greater on left.  Multiple pulmonary emboli identified within right lower lobe pulmonary arteries. Questionable small pulmonary embolus in the left lower lobe. Compression of the right upper right lower lobe pulmonary arteries by soft tissue at the left hilum. Extensive infiltrate identified in the right upper lobe, potentially lymphangitic tumor. Increased volume loss in the upper lobes. Additional areas of potential tumor are identified in the the right middle lobe and left upper lobe. Minimal compressive atelectasis in bilateral lower lobes. Numerous osseous metastases involving thoracic vertebrae and right scapula, including a destructive lesion involving right glenoid with associated fragmentation.  IMPRESSION: Pulmonary emboli identified within right lower lobe pulmonary arteries and likely a small embolus in a left lower lobe pulmonary artery. Increased abnormal mediastinal and bilateral hilar soft tissue since previous exam compatible with tumor progression. Increased size of hepatic metastases and pulmonary tumor spread. Increased bilateral pleural effusions. Osseous metastatic disease and right retro pectoral adenopathy.  Findings called to Dr. Manus Gunning  on 07/31/2029 at 1718 hours.   Original Report Authenticated By: Ulyses Southward, M.D.    Dg Chest Port 1 View  07/31/2012  *RADIOLOGY REPORT*  Clinical Data: Breast cancer and short of breath  PORTABLE CHEST - 1 VIEW  Comparison: CT chest 05/24/2012, chest x-ray 05/23/2012  Findings: Progression  of prominent interstitial markings bilaterally especially in the right upper lobe and  right lower lobe.  Right perihilar mass lesion appears larger.  This may be due to lymphangitic spread of breast cancer.  Lung cancer not excluded.  Extensive pleural parenchymal scarring left upper lobe is unchanged.     Heart size is normal. Increase in left effusion and left lower lobe atelectasis.  IMPRESSION: Progression of prominent  interstitial markings on the right with perihilar mass lesion.  This may be due to lymphangitic spread of tumor.  Increase in left lower lobe atelectasis and left effusion.  Extensive scarring in the left apex is stable.   Original Report Authenticated By: Janeece Riggers, M.D.     Scheduled Meds:    . albuterol  2.5 mg Nebulization Q4H  . anastrozole  1 mg Oral Daily  . docusate sodium  100 mg Oral Daily  . fentaNYL  50 mcg Transdermal Q72H  . levothyroxine  150 mcg Oral QAC breakfast  . megestrol  200 mg Oral Daily  . metoCLOPramide  10 mg Oral QID  . piperacillin-tazobactam  3.375 g Intravenous Q8H  . sodium chloride  3 mL Intravenous Q12H  . vancomycin  750 mg Intravenous Q8H   Continuous Infusions:    . sodium chloride 1,000 mL (08/02/12 0109)  . heparin 1,200 Units/hr (08/01/12 1106)    Time spent: 35 minutes.   LOS: 2 days   Jameil Whitmoyer  [facility] Pager [phone].  If 8PM-8AM, please contact night-coverage at [email or web address], password [facility] 08/02/2012, 7:12 AM

## 2012-08-02 NOTE — Progress Notes (Signed)
ANTIBIOTIC CONSULT NOTE - FOLLOW UP  Pharmacy Consult for Vanc Indication: pneumonia  No Known Allergies  Patient Measurements: Height: 5' 9.5" (176.5 cm) Weight: 138 lb 14.2 oz (63 kg) IBW/kg (Calculated) : 67.35    Vital Signs: Temp: 98.9 F (37.2 C) (12/25 1600) Temp src: Oral (12/25 1600) BP: 107/70 mmHg (12/25 1800) Pulse Rate: 111  (12/25 1800) Intake/Output from previous day: 12/24 0701 - 12/25 0700 In: 3932 [P.O.:490; I.V.:2192; IV Piggyback:1250] Out: 300 [Urine:300] Intake/Output from this shift:    Labs:  Basename 08/02/12 0400 08/01/12 0325 07/31/12 1445 07/31/12 1425  WBC 5.9 6.4 -- 6.6  HGB 7.7* 7.7* 10.5* --  PLT 320 338 -- 378  LABCREA -- -- -- --  CREATININE 0.65 0.71 0.70 --   Estimated Creatinine Clearance: 86.5 ml/min (by C-G formula based on Cr of 0.65).  Basename 08/02/12 1910  VANCOTROUGH 21.7*  VANCOPEAK --  Drue Dun --  GENTTROUGH --  GENTPEAK --  GENTRANDOM --  TOBRATROUGH --  TOBRAPEAK --  TOBRARND --  AMIKACINPEAK --  AMIKACINTROU --  AMIKACIN --     Microbiology: Recent Results (from the past 720 hour(s))  CLOSTRIDIUM DIFFICILE BY PCR     Status: Normal   Collection Time   07/25/12 11:33 AM      Component Value Range Status Comment   C difficile by pcr Negative  Negative Final   CULTURE, BLOOD (ROUTINE X 2)     Status: Normal (Preliminary result)   Collection Time   07/31/12  2:26 PM      Component Value Range Status Comment   Specimen Description BLOOD LEFT ARM   Final    Special Requests BOTTLES DRAWN AEROBIC AND ANAEROBIC 5CC   Final    Culture  Setup Time 07/31/2012 21:42   Final    Culture     Final    Value:        BLOOD CULTURE RECEIVED NO GROWTH TO DATE CULTURE WILL BE HELD FOR 5 DAYS BEFORE ISSUING A FINAL NEGATIVE REPORT   Report Status PENDING   Incomplete   CULTURE, BLOOD (ROUTINE X 2)     Status: Normal (Preliminary result)   Collection Time   07/31/12  3:03 PM      Component Value Range Status  Comment   Specimen Description BLOOD RIGHT HAND   Final    Special Requests BOTTLES DRAWN AEROBIC ONLY 5CC   Final    Culture  Setup Time 07/31/2012 21:42   Final    Culture     Final    Value:        BLOOD CULTURE RECEIVED NO GROWTH TO DATE CULTURE WILL BE HELD FOR 5 DAYS BEFORE ISSUING A FINAL NEGATIVE REPORT   Report Status PENDING   Incomplete   URINE CULTURE     Status: Normal   Collection Time   07/31/12  5:56 PM      Component Value Range Status Comment   Specimen Description URINE, RANDOM   Final    Special Requests NONE   Final    Culture  Setup Time 08/01/2012 01:59   Final    Colony Count 80,000 COLONIES/ML   Final    Culture     Final    Value: Multiple bacterial morphotypes present, none predominant. Suggest appropriate recollection if clinically indicated.   Report Status 08/01/2012 FINAL   Final   MRSA PCR SCREENING     Status: Normal   Collection Time   07/31/12  8:33 PM  Component Value Range Status Comment   MRSA by PCR NEGATIVE  NEGATIVE Final     Anti-infectives     Start     Dose/Rate Route Frequency Ordered Stop   08/01/12 2000   vancomycin (VANCOCIN) 750 mg in sodium chloride 0.9 % 150 mL IVPB        750 mg 150 mL/hr over 60 Minutes Intravenous Every 8 hours 08/01/12 1043     08/01/12 1045   vancomycin (VANCOCIN) 750 mg in sodium chloride 0.9 % 150 mL IVPB        750 mg 150 mL/hr over 60 Minutes Intravenous  Once 08/01/12 1042 08/01/12 1314   07/31/12 2200  piperacillin-tazobactam (ZOSYN) IVPB 3.375 g       3.375 g 12.5 mL/hr over 4 Hours Intravenous 3 times per day 07/31/12 2041     07/31/12 2200   vancomycin (VANCOCIN) 1,500 mg in sodium chloride 0.9 % 500 mL IVPB  Status:  Discontinued        1,500 mg 250 mL/hr over 120 Minutes Intravenous Every 12 hours 07/31/12 2052 08/01/12 1041          Assessment:  46 YOF on day 3 Vancomycin 750mg  IV q8h and Zosyn 3.375g IV q8h (extended infusion) for pulm infiltrates, infx vs tumor spread  Scr  wnl/stable CrCl >140ml/min/1.73m2  WBC wnl, Tm 99.1  Ucx 12/23 = 80Kcol/ml mult morphotypes, Bcx x 2 12/23 NGTD  VT tonight slightly above goal (21.7)  Goal of Therapy:  Vancomycin trough level 15-20 mcg/ml  Plan:   Change Vancomycin to 500mg  IV q8h  Repeat VT as necessary  Adjust dose as appropriate  Duration of Tx per MD  Gwen Her PharmD  [phone] 08/02/2012 8:09 PM

## 2012-08-02 NOTE — Progress Notes (Addendum)
On-call MSW Note. Patient in bed receiving a breathing treatment. She shook her head "no" when asked if she was having any pain. Her nurse, Gregary Signs was at the bedside. He advised that patient was doing a little better today. There were no plans for discharge today. No family was at the beside. Patient did not verbalize any needs. This is a hospice related admission, hospice to assist with any discharge plans. Please call [facility], [phone] for any concerns or changes in patient's condition.  Monica P. Lonon,LCSWA

## 2012-08-02 NOTE — Progress Notes (Signed)
ANTICOAGULATION CONSULT NOTE   Pharmacy Consult for Lovenox Indication: pulmonary embolus  No Known Allergies  Patient Measurements: Height: 5' 9.5" (176.5 cm) Weight: 138 lb 14.2 oz (63 kg) IBW/kg (Calculated) : 67.35   Vital Signs: Temp: 98.4 F (36.9 C) (12/25 0409) Temp src: Oral (12/25 0409) BP: 149/71 mmHg (12/25 0700) Pulse Rate: 112  (12/25 0700)  Labs:  Basename 08/02/12 0400 08/01/12 0946 08/01/12 0325 07/31/12 2114 07/31/12 1802 07/31/12 1445 07/31/12 1426 07/31/12 1425  HGB 7.7* -- 7.7* -- -- -- -- --  HCT 23.9* -- 23.6* -- -- 31.0* -- --  PLT 320 -- 338 -- -- -- -- 378  APTT -- -- -- -- 25 -- -- --  LABPROT -- -- -- -- 15.5* -- -- --  INR -- -- -- -- 1.25 -- -- --  HEPARINUNFRC 0.28* 0.41 0.46 -- -- -- -- --  CREATININE 0.65 -- 0.71 -- -- 0.70 -- --  CKTOTAL -- -- -- -- -- -- -- --  CKMB -- -- -- -- -- -- -- --  TROPONINI -- -- 0.48* <0.30 -- -- 0.43* --    Estimated Creatinine Clearance: 86.5 ml/min (by C-G formula based on Cr of 0.65).   Medical History: Past Medical History  Diagnosis Date  . Breast lump   . Thyroid disease   . Hypertension   . Hx of radiation therapy 06/2010, 10/2009, 11/2011    L breast, T3-T12 spine, L-S spine  . 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 Breast cancer 09/2009    L breast, ER+, PR-, Her 2 -  . History of radiation therapy 10/27/11-11/15/11    lumbar/sacral spine/18MV photons    Medications:  Scheduled:    . albuterol  2.5 mg Nebulization Q4H  . anastrozole  1 mg Oral Daily  . docusate sodium  100 mg Oral Daily  . fentaNYL  50 mcg Transdermal Q72H  . levothyroxine  150 mcg Oral QAC breakfast  . megestrol  200 mg Oral Daily  . metoCLOPramide  10 mg Oral QID  . [COMPLETED] pamidronate  60 mg Intravenous Once  . piperacillin-tazobactam  3.375 g Intravenous Q8H  . [COMPLETED] potassium chloride  40 mEq Oral Once  . [COMPLETED] potassium chloride  40 mEq Oral Once  . sodium chloride  3 mL Intravenous Q12H  .  [COMPLETED] vancomycin  750 mg Intravenous Once  . vancomycin  750 mg Intravenous Q8H  . [DISCONTINUED] vancomycin  1,500 mg Intravenous Q12H   Infusions:    . sodium chloride 1,000 mL (08/02/12 0109)  . heparin 1,200 Units/hr (08/01/12 1106)    Assessment: 32 YOF with metastatic breast cancer, presents to ED with shortness of breath; CT shows bilateral PE.  Heparin IV was started 12/23 Heparin levels have been therapeutic, but decreased to 0.28 this morning.  Pharmacy dosing requested for change to Lovenox SQ injections on 12/25. Plt wnl, Hgb stable/low at 7.7, CrCl ~ 86 ml/min.    Goal of Therapy:  Anti-Xa level 0.6-1.2 units/ml 4hrs after LMWH dose given Monitor platelets by anticoagulation protocol: Yes   Plan:   Lovenox 65mg  SQ q12h, begin 1 hour after heparin IV infusion is discontinued.  Follow up CBC and renal function.  Lynann Beaver PharmD, BCPS Pager [phone] 08/02/2012 7:31 AM

## 2012-08-03 DIAGNOSIS — R197 Diarrhea, unspecified: Secondary | ICD-10-CM | POA: Diagnosis not present

## 2012-08-03 LAB — CBC
HCT: 24.5 % — ABNORMAL LOW (ref 36.0–46.0)
Hemoglobin: 7.7 g/dL — ABNORMAL LOW (ref 12.0–15.0)
MCH: 24.7 pg — ABNORMAL LOW (ref 26.0–34.0)
MCHC: 31.4 g/dL (ref 30.0–36.0)
RDW: 23.4 % — ABNORMAL HIGH (ref 11.5–15.5)

## 2012-08-03 LAB — BASIC METABOLIC PANEL
BUN: 7 mg/dL (ref 6–23)
Calcium: 8.1 mg/dL — ABNORMAL LOW (ref 8.4–10.5)
Creatinine, Ser: 0.81 mg/dL (ref 0.50–1.10)
GFR calc Af Amer: >90 mL/min (ref >90)
GFR calc non Af Amer: 85 mL/min — ABNORMAL LOW (ref >90)
Glucose, Bld: 106 mg/dL — ABNORMAL HIGH (ref 70–99)
Potassium: 3.7 mEq/L (ref 3.5–5.1)

## 2012-08-03 LAB — HEPARIN LEVEL (UNFRACTIONATED): Heparin Unfractionated: 0.34 IU/mL (ref 0.30–0.70)

## 2012-08-03 MED ORDER — METOPROLOL TARTRATE 12.5 MG HALF TABLET
12.5000 mg | ORAL_TABLET | Freq: Two times a day (BID) | ORAL | Status: DC
  Administered 2012-08-03 – 2012-08-08 (×11): 12.5 mg via ORAL
  Filled 2012-08-03 (×12): qty 1

## 2012-08-03 MED ORDER — FUROSEMIDE 10 MG/ML IJ SOLN
40.0000 mg | Freq: Once | INTRAMUSCULAR | Status: AC
  Administered 2012-08-03: 40 mg via INTRAVENOUS
  Filled 2012-08-03 (×2): qty 4

## 2012-08-03 NOTE — Progress Notes (Signed)
[facility] PROGRESS NOTE  Jalasia Eskridge VWU:[id number] DOB: [birth year] DOA: 07/31/2012 PCP: [name], MD  Brief narrative: Kristin [name] is a 46 year old woman with a past medical history of stage IV breast cancer with metastasis to liver and bone who was admitted to the hospital on 07/31/2012 with SVT (heart rate originally over 200) who was found to have bilateral pulmonary emboli on admission.  Assessment/Plan: *Principal Problem: Pulmonary embolism   Admitted and placed on IV heparin. Refused blood draws for heparin monitoring so was switched to SQ Lovenox 08/02/12.  Will need anticoagulation long-term due to concomitant metastatic breast cancer, unless she opts for comfort care.  Echocardiogram done 08/01/12.  EF 60-65%, no evidence of RV pressure overload.  PEs are very small on imaging.  Seen by Dr. Darnelle Catalan 08/01/12.  Long-term anticoagulation may pose financial challenges since hospice not likely to cover newer anticoagulants that require little monitoring.  Stage IV breast cancer   Followed by Dr. Darnelle Catalan.   Patient remains a FULL code despite recommendations otherwise by Dr. Darnelle Catalan, who spoke with her about her CODE STATUS 08/01/12.   Continue Arimidex. Chest discomfort / elevated troponin / SVT / demand ischemia / NSTEMI  Likely due to the patient's pulmonary embolus and SVT resulting in demand ischemia. Status post adenosine. Control heart rate.  EKG without any acute ST-T wave changes.  No wall motion abnormalities on 2-D Echo.  Malignant pleural effusions   May be contributing to the patient's dyspnea.   May need consultation with IR for thoracocentesis or a PleurX catheter. Pulmonary infiltrates   Noted in the right upper lobe   Difficult to differentiate infectious process versus lymphangitic tumor spread   Followup final blood cultures which are negative to date.  Continue empiric vancomycin and Zosyn for now.  Continue nebulized  bronchodilator therapy.  Repeat chest x-ray in the morning. Syncope   Likely due to the patient's SVT--->back in sinus post adenosine.   Start metoprolol with hold parameters for low systolic blood pressure or pulse less than 70. Chronic pain   Continue intravenous hydromorphone for pain control.  Continue fentanyl patch 50 mcg every 72 hours.  Microcytic anemia   Ferritin 453, low iron at 23, TIBC 126, saturation ration 18, UIBC 103.  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  Hemoglobin drop noted but stable over the past 48 hours. Likely dilutional. Monitor. Elevated BNP  Likely from pulmonary embolism. No evidence of CHF on Echo.  Either represents volume overload or PE.  IV fluids minimized and given a dose of Lasix 08/02/2012. I and O. balance slightly negative. We'll repeat Lasix dose today.  Recheck pro BNP levels in the morning. Hypokalemia  Replete orally. Elevated LFTs  Likely from metastatic disease involving the liver. Hypothyroidism  Continue Synthroid. Anxiety/panic attack  Having frequent panic attacks and significant anxiety. Seems to be triggered by breathing treatments. Will change Ativan to IV route for better symptom control. Diarrhea  Followup stool sent for C. difficile PCR.   Code Status: Full. Family Communication: Sister updated at bedside Disposition Plan: Home versus residential hospice.   Medical Consultants:  Dr. Ruthann Cancer, Oncology  Other Consultants:  None.  Anti-infectives:  Vancomycin 07/31/2012--->  Zosyn 07/31/2012--->  HPI/Subjective: [name] continues to have significant dyspnea and oxygen desaturations with minimal activity. She began having diarrhea stools yesterday, had none over night, and one loose stool this morning which was sent for C. Difficile PCR. She continues to be very anxious and hyperventilates.  Objective: Filed  Vitals:   08/03/12 0004 08/03/12 0200 08/03/12 0400 08/03/12 0425  BP:   116/61 100/65   Pulse:  120 120   Temp:   99.6 F (37.6 C)   TempSrc:   Axillary   Resp:  19 21   Height:      Weight:      SpO2: 95% 99% 98% 98%    Intake/Output Summary (Last 24 hours) at 08/03/12 0723 Last data filed at 08/03/12 0400  Gross per 24 hour  Intake 743.42 ml  Output   1050 ml  Net -306.58 ml    Exam: Gen:  Anxious Cardiovascular:  Tachycardic Respiratory:  Lungs coarse with bilateral rhonchi, tachypneic Gastrointestinal:  Abdomen soft, NT/ND, + BS Extremities:  1+ edema  Data Reviewed: Basic Metabolic Panel:  Lab 08/03/12 7829 08/02/12 0400 08/01/12 0325 07/31/12 1445  NA 137 140 137 142  K 3.7 3.1* -- --  CL 106 110 105 106  CO2 24 23 26  --  GLUCOSE 106* 141* 106* 107*  BUN 7 7 7 7   CREATININE 0.81 0.65 0.71 0.70  CALCIUM 8.1* 8.2* 8.3* --  MG -- -- -- --  PHOS -- -- -- --   GFR Estimated Creatinine Clearance: 85.4 ml/min (by C-G formula based on Cr of 0.81). Coagulation profile  Lab 07/31/12 1802  INR 1.25  PROTIME --    CBC:  Lab 08/03/12 0525 08/02/12 0400 08/01/12 0325 07/31/12 1445 07/31/12 1425  WBC 6.3 5.9 6.4 -- 6.6  NEUTROABS -- -- -- -- 5.0  HGB 7.7* 7.7* 7.7* 10.5* 9.2*  HCT 24.5* 23.9* 23.6* 31.0* 27.9*  MCV 78.5 78.9 78.9 -- 77.3*  PLT 318 320 338 -- 378   Cardiac Enzymes:  Lab 08/01/12 0325 07/31/12 2114 07/31/12 1426  CKTOTAL -- -- --  CKMB -- -- --  CKMBINDEX -- -- --  TROPONINI 0.48* <0.30 0.43*   BNP (last 3 results)  Basename 07/31/12 1426  PROBNP 1950.0*   Microbiology Recent Results (from the past 240 hour(s))  CLOSTRIDIUM DIFFICILE BY PCR     Status: Normal   Collection Time   07/25/12 11:33 AM      Component Value Range Status Comment   C difficile by pcr Negative  Negative Final   CULTURE, BLOOD (ROUTINE X 2)     Status: Normal (Preliminary result)   Collection Time   07/31/12  2:26 PM      Component Value Range Status Comment   Specimen Description BLOOD LEFT ARM   Final    Special Requests  BOTTLES DRAWN AEROBIC AND ANAEROBIC 5CC   Final    Culture  Setup Time 07/31/2012 21:42   Final    Culture     Final    Value:        BLOOD CULTURE RECEIVED NO GROWTH TO DATE CULTURE WILL BE HELD FOR 5 DAYS BEFORE ISSUING A FINAL NEGATIVE REPORT   Report Status PENDING   Incomplete   CULTURE, BLOOD (ROUTINE X 2)     Status: Normal (Preliminary result)   Collection Time   07/31/12  3:03 PM      Component Value Range Status Comment   Specimen Description BLOOD RIGHT HAND   Final    Special Requests BOTTLES DRAWN AEROBIC ONLY 5CC   Final    Culture  Setup Time 07/31/2012 21:42   Final    Culture     Final    Value:        BLOOD CULTURE RECEIVED NO GROWTH  TO DATE CULTURE WILL BE HELD FOR 5 DAYS BEFORE ISSUING A FINAL NEGATIVE REPORT   Report Status PENDING   Incomplete   URINE CULTURE     Status: Normal   Collection Time   07/31/12  5:56 PM      Component Value Range Status Comment   Specimen Description URINE, RANDOM   Final    Special Requests NONE   Final    Culture  Setup Time 08/01/2012 01:59   Final    Colony Count 80,000 COLONIES/ML   Final    Culture     Final    Value: Multiple bacterial morphotypes present, none predominant. Suggest appropriate recollection if clinically indicated.   Report Status 08/01/2012 FINAL   Final   MRSA PCR SCREENING     Status: Normal   Collection Time   07/31/12  8:33 PM      Component Value Range Status Comment   MRSA by PCR NEGATIVE  NEGATIVE Final      Procedures and Diagnostic Studies: Ct Angio Chest W/cm &/or Wo Cm  07/31/2012  *RADIOLOGY REPORT*  Clinical Data: Breathing discomfort, increasing shortness of breath, metastatic breast cancer, hypertension  CT ANGIOGRAPHY CHEST  Technique:  Multidetector CT imaging of the chest using the standard protocol during bolus administration of intravenous contrast. Multiplanar reconstructed images including MIPs were obtained and reviewed to evaluate the vascular anatomy.  Contrast: OMNIPAQUE  IOHEXOL 350 MG/ML SOLN  Comparison: 05/24/2012  Findings: Aorta normal caliber without aneurysm or dissection. Metastatic lesion at medial aspect right lobe liver image 96 4.0 x 3.3 cm, previously 3.1 x 2.0 cm and lateral segment left lobe 7.0 x 4.5 cm previously 6.1 x 2.8 cm. Significant intrahepatic biliary dilatation. Question abnormal soft tissue and gastrohepatic ligament suspicious for tumor. New significant intrahepatic biliary dilatation. Thickening of the left adrenal gland and perihepatic ascites noted. Soft tissue nodule posterior to the upper pole of the left kidney 7 mm diameter unchanged.  Abnormal soft tissues identified throughout the anterior mediastinum, both hila, and subcarinal region compatible with tumor increased since previous exam. Multiple right retro pectoral nodes, slightly increased. Question adenopathy at the bases of the cervical regions bilaterally. Narrowing of the right upper lobe bronchi at the origins. Moderate sized bilateral pleural effusions greater on left.  Multiple pulmonary emboli identified within right lower lobe pulmonary arteries. Questionable small pulmonary embolus in the left lower lobe. Compression of the right upper right lower lobe pulmonary arteries by soft tissue at the left hilum. Extensive infiltrate identified in the right upper lobe, potentially lymphangitic tumor. Increased volume loss in the upper lobes. Additional areas of potential tumor are identified in the the right middle lobe and left upper lobe. Minimal compressive atelectasis in bilateral lower lobes. Numerous osseous metastases involving thoracic vertebrae and right scapula, including a destructive lesion involving right glenoid with associated fragmentation.  IMPRESSION: Pulmonary emboli identified within right lower lobe pulmonary arteries and likely a small embolus in a left lower lobe pulmonary artery. Increased abnormal mediastinal and bilateral hilar soft tissue since previous exam  compatible with tumor progression. Increased size of hepatic metastases and pulmonary tumor spread. Increased bilateral pleural effusions. Osseous metastatic disease and right retro pectoral adenopathy.  Findings called to Dr. Manus Gunning  on 07/31/2029 at 1718 hours.   Original Report Authenticated By: Ulyses Southward, M.D.    Dg Chest Port 1 View  07/31/2012  *RADIOLOGY REPORT*  Clinical Data: Breast cancer and short of breath  PORTABLE CHEST - 1 VIEW  Comparison: CT chest 05/24/2012, chest x-ray 05/23/2012  Findings: Progression of prominent interstitial markings bilaterally especially in the right upper lobe and  right lower lobe.  Right perihilar mass lesion appears larger.  This may be due to lymphangitic spread of breast cancer.  Lung cancer not excluded.  Extensive pleural parenchymal scarring left upper lobe is unchanged.     Heart size is normal. Increase in left effusion and left lower lobe atelectasis.  IMPRESSION: Progression of prominent interstitial markings on the right with perihilar mass lesion.  This may be due to lymphangitic spread of tumor.  Increase in left lower lobe atelectasis and left effusion.  Extensive scarring in the left apex is stable.   Original Report Authenticated By: Janeece Riggers, M.D.     Scheduled Meds:    . albuterol  2.5 mg Nebulization Q4H  . anastrozole  1 mg Oral Daily  . docusate sodium  100 mg Oral Daily  . enoxaparin (LOVENOX) injection  1 mg/kg Subcutaneous BID  . fentaNYL  50 mcg Transdermal Q72H  . levothyroxine  150 mcg Oral QAC breakfast  . megestrol  200 mg Oral Daily  . metoCLOPramide  10 mg Oral QID  . piperacillin-tazobactam  3.375 g Intravenous Q8H  . potassium chloride  20 mEq Oral BID  . sodium chloride  3 mL Intravenous Q12H  . vancomycin  500 mg Intravenous Q8H   Continuous Infusions:    . 0.9 % NaCl with KCl 40 mEq / L 10 mL/hr (08/02/12 0917)    Time spent: 35 minutes.   LOS: 3 days   Chapel Silverthorn  [facility] Pager  [phone].  If 8PM-8AM, please contact night-coverage at [email or web address], password [facility] 08/03/2012, 7:23 AM

## 2012-08-03 NOTE — Progress Notes (Signed)
Room 1231- Kristin [name] - [facility] RN Visit-R.Damian Buckles RN  Related admission to [facility] diagnosis of Metastatic breast cancer.  Pt is FULL code.    Pt alert & oriented, lethargic, sitting upright in bed, with complaints of dyspnea with audible congested breath sounds.  Sister present. Pt denies refusing blood draws for heparin levels - states she got upset one time and unable to discern issues as pt's sister discussing over our conversation. Patient's home medication list is on shadow chart.   Please call [facility] @ [phone]- ask for RN Liaison or after hours,ask for on-call RN with any hospice needs.    Thank you.  Kristin Boers, RN  [facility]  Hospice Liaison

## 2012-08-03 NOTE — Progress Notes (Signed)
Unable to insert foley cath (3 attempts between [facility] RN and myself unsuccessful) . Unable to visualize urinary meatus. Pt voiding frequently on bedpan but has increased WOB after each activity. Kristin [name]

## 2012-08-03 NOTE — Progress Notes (Signed)
Sister in room with patient. Offered support to both. Sister and patient immediately said, "Prayer." When asked regarding what to pray, said, "Everything." Prayed with them and brought them prayer shawls and palm crosses. Will continue to support.

## 2012-08-03 NOTE — Progress Notes (Signed)
Pt talked about desire to go home.  Pt mumbling so it was difficult to understand what she was saying at times.  Pt's mother with her. Spoke with mother in hall.  Mother recognizes that pt is declining and states that she will be glad when this is over for pt.  Pt is not receptive to comfort care only and wants whatever can be done for her to be done. Pt continues to desire to be a full code.  Pt would not be appropriate for [facility] since she is not accepting of comfort care only.   Pt's mother states that she would take pt home when she is ready for discharge if that is pt's desire. Wynonia Hazard, LCSW

## 2012-08-04 ENCOUNTER — Inpatient Hospital Stay (HOSPITAL_COMMUNITY)

## 2012-08-04 LAB — CBC
MCH: 25.2 pg — ABNORMAL LOW (ref 26.0–34.0)
MCHC: 32.4 g/dL (ref 30.0–36.0)
MCV: 77.8 fL — ABNORMAL LOW (ref 78.0–100.0)
Platelets: 299 10*3/uL (ref 150–400)
RBC: 3.06 MIL/uL — ABNORMAL LOW (ref 3.87–5.11)
RDW: 22.8 % — ABNORMAL HIGH (ref 11.5–15.5)

## 2012-08-04 LAB — PRO B NATRIURETIC PEPTIDE: Pro B Natriuretic peptide (BNP): 316.8 pg/mL — ABNORMAL HIGH (ref 0–125)

## 2012-08-04 MED ORDER — FUROSEMIDE 10 MG/ML IJ SOLN
40.0000 mg | Freq: Once | INTRAMUSCULAR | Status: AC
  Administered 2012-08-04: 40 mg via INTRAVENOUS
  Filled 2012-08-04: qty 4

## 2012-08-04 MED ORDER — METHYLPREDNISOLONE SODIUM SUCC 40 MG IJ SOLR
40.0000 mg | Freq: Two times a day (BID) | INTRAMUSCULAR | Status: DC
  Administered 2012-08-04 – 2012-08-06 (×6): 40 mg via INTRAVENOUS
  Filled 2012-08-04 (×8): qty 1

## 2012-08-04 MED ORDER — SCOPOLAMINE 1 MG/3DAYS TD PT72
1.0000 | MEDICATED_PATCH | TRANSDERMAL | Status: DC
  Administered 2012-08-04 – 2012-08-07 (×2): 1.5 mg via TRANSDERMAL
  Filled 2012-08-04 (×2): qty 1

## 2012-08-04 MED ORDER — HYDROMORPHONE HCL PF 2 MG/ML IJ SOLN
2.0000 mg | INTRAMUSCULAR | Status: DC | PRN
  Administered 2012-08-04 – 2012-08-06 (×9): 2 mg via INTRAVENOUS
  Filled 2012-08-04 (×9): qty 1

## 2012-08-04 MED ORDER — GUAIFENESIN-DM 100-10 MG/5ML PO SYRP
5.0000 mL | ORAL_SOLUTION | ORAL | Status: DC | PRN
  Administered 2012-08-04: 5 mL via ORAL
  Filled 2012-08-04: qty 10

## 2012-08-04 NOTE — Progress Notes (Signed)
Room 1231 - Kristin [name] - [facility] RN Visit-R.Macon Lesesne RN  Related admission to [facility] diagnosis of metastatic breast cancer.  Pt is changed to limited code- do not intubate - following conversation with staff RN Darl Pikes who states pt's breathing is much worse and she expected pt might be on vent when she took over care this am.  Staff RN Darl Pikes states she does not think pt knows what that means. This RN greeted pt and asked in the presence of Staff RN Darl Pikes and RTT Haskel Schroeder if her breathing got worse and the mask/02 was not working well, would she want the tube down her throat and to breathe off a machine.  Pt alert & oriented, sitting upright in bed stated NO.  This Clinical research associate confirmed by repeating the question and patient's answer again was NO.  Pt presented alert and oriented when asked her full name, her DOB, president of [place] and where she was, she answered all questions appropriately.  Discussed with Dr Darnelle Catalan - attending and she changed pt to PARTIAL code - do not intubate.   No family present.   Staff RN administering meds and RTT administering breathing treatment.  Pt's only complaint was her difficulty breathing.  Patient's home medication list is on shadow chart.   Please call [facility] @ [phone]- ask for RN Liaison or after hours,ask for on-call RN with any hospice needs.    Thank you.  [name], RN  [facility]  Hospice Liaison

## 2012-08-04 NOTE — Progress Notes (Signed)
[facility] PROGRESS NOTE  Cheree Fowles FAO:[id number] DOB: [birth year] DOA: 07/31/2012 PCP: [name], MD  Brief narrative: Mrs. Woodrow is a 46 year old woman with a past medical history of stage IV breast cancer with metastasis to liver and bone who was admitted to the hospital on 07/31/2012 with SVT (heart rate originally over 200) who was found to have bilateral pulmonary emboli on admission.  Assessment/Plan: *Principal Problem: Pulmonary embolism   Admitted and placed on IV heparin. Refused blood draws for heparin monitoring so was switched to SQ Lovenox 08/02/12.  Will need anticoagulation long-term due to concomitant metastatic breast cancer, unless she opts for comfort care.  Echocardiogram done 08/01/12.  EF 60-65%, no evidence of RV pressure overload.  PEs are very small on imaging.  Seen by Dr. Darnelle Catalan 08/01/12.  Long-term anticoagulation may pose financial challenges since hospice not likely to cover newer anticoagulants that require little monitoring.  Stage IV breast cancer   Followed by Dr. Darnelle Catalan.   Patient remains a FULL code despite recommendations otherwise by Dr. Darnelle Catalan, who spoke with her about her CODE STATUS 08/01/12. Hospice nurse spoke with her today, and she agreed to DNI/limited code status.  Continue Arimidex. Chest discomfort / elevated troponin / SVT / demand ischemia / NSTEMI  Likely due to the patient's pulmonary embolus and SVT resulting in demand ischemia. Status post adenosine. Control heart rate.  EKG without any acute ST-T wave changes.  No wall motion abnormalities on 2-D Echo.  Malignant pleural effusions   May be contributing to the patient's dyspnea.  Pulmonary infiltrates   Noted in the right upper lobe Difficult to differentiate infectious process versus lymphangitic tumor spread.  Followup final blood cultures which are negative to date.  Continue empiric vancomycin and Zosyn for now.  Continue nebulized  bronchodilator therapy.  Add solumedrol.  CXR done today, stable. Syncope   Likely due to the patient's SVT--->back in sinus post adenosine.   Start metoprolol with hold parameters for low systolic blood pressure or pulse less than 70. Chronic pain   Continue intravenous hydromorphone for pain control.  Continue fentanyl patch 50 mcg every 72 hours.  Microcytic anemia   Ferritin 453, low iron at 23, TIBC 126, saturation ration 18, UIBC 103.  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  Hemoglobin drop noted but stable over the past 72 hours. Likely dilutional. Monitor. Elevated BNP  Likely from pulmonary embolism. No evidence of CHF on Echo.  Either represents volume overload or PE.  IV fluids minimized and given a dose of Lasix 08/02/2012 & 08/03/12.   Pro BNP improved. Hypokalemia  Replete orally. Elevated LFTs  Likely from metastatic disease involving the liver. Hypothyroidism  Continue Synthroid. Anxiety/panic attack  Having frequent panic attacks and significant anxiety. Seems to be triggered by breathing treatments. Will change Ativan to IV route for better symptom control. Diarrhea  C. difficile PCR negative.   Code Status: DNI, limited code. Family Communication: Sister updated at bedside Disposition Plan: Home versus residential hospice.   Medical Consultants:  Dr. Ruthann Cancer, Oncology  Other Consultants:  None.  Anti-infectives:  Vancomycin 07/31/2012--->  Zosyn 07/31/2012--->  HPI/Subjective: Mrs. Postema responded to Lasix given yesterday, according to nursing staff.  She still is quite dyspneic, especially with even limited activity such as getting on the bedpan.  Has a moist but non-productive cough.  Objective: Filed Vitals:   08/04/12 0200 08/04/12 0400 08/04/12 0412 08/04/12 0600  BP: 112/79 106/72  109/71  Pulse: 108 103 104 108  Temp:  98.7 F (37.1 C)    TempSrc:  Oral    Resp: 28 15 18 15   Height:      Weight:  64.2 kg  (141 lb 8.6 oz)    SpO2: 99% 99% 99% 93%    Intake/Output Summary (Last 24 hours) at 08/04/12 0730 Last data filed at 08/04/12 0400  Gross per 24 hour  Intake   1568 ml  Output   1300 ml  Net    268 ml    Exam: Gen:  Anxious Cardiovascular:  Tachycardic Respiratory:  Lungs coarse with bilateral rhonchi, tachypneic Gastrointestinal:  Abdomen soft, NT/ND, + BS Extremities:  1+ edema  Data Reviewed: Basic Metabolic Panel:  Lab 08/03/12 6213 08/02/12 0400 08/01/12 0325 07/31/12 1445  NA 137 140 137 142  K 3.7 3.1* -- --  CL 106 110 105 106  CO2 24 23 26  --  GLUCOSE 106* 141* 106* 107*  BUN 7 7 7 7   CREATININE 0.81 0.65 0.71 0.70  CALCIUM 8.1* 8.2* 8.3* --  MG -- -- -- --  PHOS -- -- -- --   GFR Estimated Creatinine Clearance: 87 ml/min (by C-G formula based on Cr of 0.81). Coagulation profile  Lab 07/31/12 1802  INR 1.25  PROTIME --    CBC:  Lab 08/04/12 0340 08/03/12 0525 08/02/12 0400 08/01/12 0325 07/31/12 1445 07/31/12 1425  WBC 5.7 6.3 5.9 6.4 -- 6.6  NEUTROABS -- -- -- -- -- 5.0  HGB 7.7* 7.7* 7.7* 7.7* 10.5* --  HCT 23.8* 24.5* 23.9* 23.6* 31.0* --  MCV 77.8* 78.5 78.9 78.9 -- 77.3*  PLT 299 318 320 338 -- 378   Cardiac Enzymes:  Lab 08/01/12 0325 07/31/12 2114 07/31/12 1426  CKTOTAL -- -- --  CKMB -- -- --  CKMBINDEX -- -- --  TROPONINI 0.48* <0.30 0.43*   BNP (last 3 results)  Basename 08/04/12 0340 07/31/12 1426  PROBNP 316.8* 1950.0*   Microbiology Recent Results (from the past 240 hour(s))  CLOSTRIDIUM DIFFICILE BY PCR     Status: Normal   Collection Time   07/25/12 11:33 AM      Component Value Range Status Comment   C difficile by pcr Negative  Negative Final   CULTURE, BLOOD (ROUTINE X 2)     Status: Normal (Preliminary result)   Collection Time   07/31/12  2:26 PM      Component Value Range Status Comment   Specimen Description BLOOD LEFT ARM   Final    Special Requests BOTTLES DRAWN AEROBIC AND ANAEROBIC 5CC   Final     Culture  Setup Time 07/31/2012 21:42   Final    Culture     Final    Value:        BLOOD CULTURE RECEIVED NO GROWTH TO DATE CULTURE WILL BE HELD FOR 5 DAYS BEFORE ISSUING A FINAL NEGATIVE REPORT   Report Status PENDING   Incomplete   CULTURE, BLOOD (ROUTINE X 2)     Status: Normal (Preliminary result)   Collection Time   07/31/12  3:03 PM      Component Value Range Status Comment   Specimen Description BLOOD RIGHT HAND   Final    Special Requests BOTTLES DRAWN AEROBIC ONLY 5CC   Final    Culture  Setup Time 07/31/2012 21:42   Final    Culture     Final    Value:        BLOOD CULTURE RECEIVED NO GROWTH TO DATE CULTURE  WILL BE HELD FOR 5 DAYS BEFORE ISSUING A FINAL NEGATIVE REPORT   Report Status PENDING   Incomplete   URINE CULTURE     Status: Normal   Collection Time   07/31/12  5:56 PM      Component Value Range Status Comment   Specimen Description URINE, RANDOM   Final    Special Requests NONE   Final    Culture  Setup Time 08/01/2012 01:59   Final    Colony Count 80,000 COLONIES/ML   Final    Culture     Final    Value: Multiple bacterial morphotypes present, none predominant. Suggest appropriate recollection if clinically indicated.   Report Status 08/01/2012 FINAL   Final   MRSA PCR SCREENING     Status: Normal   Collection Time   07/31/12  8:33 PM      Component Value Range Status Comment   MRSA by PCR NEGATIVE  NEGATIVE Final   CLOSTRIDIUM DIFFICILE BY PCR     Status: Normal   Collection Time   08/03/12  8:02 AM      Component Value Range Status Comment   C difficile by pcr NEGATIVE  NEGATIVE Final      Procedures and Diagnostic Studies: Ct Angio Chest W/cm &/or Wo Cm  07/31/2012  *RADIOLOGY REPORT*  Clinical Data: Breathing discomfort, increasing shortness of breath, metastatic breast cancer, hypertension  CT ANGIOGRAPHY CHEST  Technique:  Multidetector CT imaging of the chest using the standard protocol during bolus administration of intravenous contrast.  Multiplanar reconstructed images including MIPs were obtained and reviewed to evaluate the vascular anatomy.  Contrast: OMNIPAQUE IOHEXOL 350 MG/ML SOLN  Comparison: 05/24/2012  Findings: Aorta normal caliber without aneurysm or dissection. Metastatic lesion at medial aspect right lobe liver image 96 4.0 x 3.3 cm, previously 3.1 x 2.0 cm and lateral segment left lobe 7.0 x 4.5 cm previously 6.1 x 2.8 cm. Significant intrahepatic biliary dilatation. Question abnormal soft tissue and gastrohepatic ligament suspicious for tumor. New significant intrahepatic biliary dilatation. Thickening of the left adrenal gland and perihepatic ascites noted. Soft tissue nodule posterior to the upper pole of the left kidney 7 mm diameter unchanged.  Abnormal soft tissues identified throughout the anterior mediastinum, both hila, and subcarinal region compatible with tumor increased since previous exam. Multiple right retro pectoral nodes, slightly increased. Question adenopathy at the bases of the cervical regions bilaterally. Narrowing of the right upper lobe bronchi at the origins. Moderate sized bilateral pleural effusions greater on left.  Multiple pulmonary emboli identified within right lower lobe pulmonary arteries. Questionable small pulmonary embolus in the left lower lobe. Compression of the right upper right lower lobe pulmonary arteries by soft tissue at the left hilum. Extensive infiltrate identified in the right upper lobe, potentially lymphangitic tumor. Increased volume loss in the upper lobes. Additional areas of potential tumor are identified in the the right middle lobe and left upper lobe. Minimal compressive atelectasis in bilateral lower lobes. Numerous osseous metastases involving thoracic vertebrae and right scapula, including a destructive lesion involving right glenoid with associated fragmentation.  IMPRESSION: Pulmonary emboli identified within right lower lobe pulmonary arteries and likely a small  embolus in a left lower lobe pulmonary artery. Increased abnormal mediastinal and bilateral hilar soft tissue since previous exam compatible with tumor progression. Increased size of hepatic metastases and pulmonary tumor spread. Increased bilateral pleural effusions. Osseous metastatic disease and right retro pectoral adenopathy.  Findings called to Dr. Manus Gunning  on 07/31/2029 at  1718 hours.   Original Report Authenticated By: Ulyses Southward, M.D.    Dg Chest Port 1 View  07/31/2012  *RADIOLOGY REPORT*  Clinical Data: Breast cancer and short of breath  PORTABLE CHEST - 1 VIEW  Comparison: CT chest 05/24/2012, chest x-ray 05/23/2012  Findings: Progression of prominent interstitial markings bilaterally especially in the right upper lobe and  right lower lobe.  Right perihilar mass lesion appears larger.  This may be due to lymphangitic spread of breast cancer.  Lung cancer not excluded.  Extensive pleural parenchymal scarring left upper lobe is unchanged.     Heart size is normal. Increase in left effusion and left lower lobe atelectasis.  IMPRESSION: Progression of prominent interstitial markings on the right with perihilar mass lesion.  This may be due to lymphangitic spread of tumor.  Increase in left lower lobe atelectasis and left effusion.  Extensive scarring in the left apex is stable.   Original Report Authenticated By: Janeece Riggers, M.D.    Dg Chest Port 1 View  08/04/2012  *RADIOLOGY REPORT*  Clinical Data: Possible health care associated pneumonia, pulmonary emboli, elevated proBNP  PORTABLE CHEST - 1 VIEW  Comparison: CTA chest dated 07/31/2012  Findings: Left apical pleural thickening/opacity.  Right upper lobe/paramediastinal opacity/mass.  Left basilar atelectasis with pleural effusion.  Known right pleural effusion is not radiographically evident.  No pneumothorax.  The appearance unchanged from prior CT.  The heart is normal in size.  Sclerotic metastasis in the right scapula/glenoid.  IMPRESSION:  Stable right upper lobe/paramediastinal opacity/mass.  Stable left apical pleural thickening/opacity.  Stable left basilar atelectasis with pleural effusion.   Original Report Authenticated By: Charline Bills, M.D.     Scheduled Meds:    . albuterol  2.5 mg Nebulization Q4H  . anastrozole  1 mg Oral Daily  . docusate sodium  100 mg Oral Daily  . enoxaparin (LOVENOX) injection  1 mg/kg Subcutaneous BID  . fentaNYL  50 mcg Transdermal Q72H  . levothyroxine  150 mcg Oral QAC breakfast  . megestrol  200 mg Oral Daily  . metoCLOPramide  10 mg Oral QID  . metoprolol tartrate  12.5 mg Oral BID  . piperacillin-tazobactam  3.375 g Intravenous Q8H  . potassium chloride  20 mEq Oral BID  . sodium chloride  3 mL Intravenous Q12H  . vancomycin  500 mg Intravenous Q8H   Continuous Infusions:    . 0.9 % NaCl with KCl 40 mEq / L 10 mL/hr (08/02/12 0917)    Time spent: 35 minutes.   LOS: 4 days   RAMA,CHRISTINA  [facility] Pager [phone].  If 8PM-8AM, please contact night-coverage at [email or web address], password [facility] 08/04/2012, 7:30 AM

## 2012-08-05 DIAGNOSIS — J9600 Acute respiratory failure, unspecified whether with hypoxia or hypercapnia: Secondary | ICD-10-CM | POA: Diagnosis present

## 2012-08-05 DIAGNOSIS — C7800 Secondary malignant neoplasm of unspecified lung: Secondary | ICD-10-CM | POA: Diagnosis present

## 2012-08-05 LAB — BASIC METABOLIC PANEL
Calcium: 8.5 mg/dL (ref 8.4–10.5)
GFR calc Af Amer: >90 mL/min (ref >90)
GFR calc non Af Amer: >90 mL/min (ref >90)
Glucose, Bld: 178 mg/dL — ABNORMAL HIGH (ref 70–99)
Potassium: 4.4 mEq/L (ref 3.5–5.1)
Sodium: 137 mEq/L (ref 135–145)

## 2012-08-05 LAB — CBC
Hemoglobin: 8.8 g/dL — ABNORMAL LOW (ref 12.0–15.0)
MCH: 24.9 pg — ABNORMAL LOW (ref 26.0–34.0)
MCHC: 31.8 g/dL (ref 30.0–36.0)
RDW: 22.5 % — ABNORMAL HIGH (ref 11.5–15.5)

## 2012-08-05 MED ORDER — FUROSEMIDE 40 MG PO TABS
40.0000 mg | ORAL_TABLET | Freq: Every day | ORAL | Status: DC
  Administered 2012-08-05 – 2012-08-08 (×4): 40 mg via ORAL
  Filled 2012-08-05 (×4): qty 1

## 2012-08-05 NOTE — Progress Notes (Signed)
ANTICOAGULATION CONSULT NOTE   Pharmacy Consult for Lovenox Indication: pulmonary embolus  No Known Allergies  Patient Measurements: Height: 5' 9.5" (176.5 cm) Weight: 141 lb 8.6 oz (64.2 kg) IBW/kg (Calculated) : 67.35   Vital Signs: Temp: 97.9 F (36.6 C) (12/28 0400) Temp src: Oral (12/28 0400) BP: 142/116 mmHg (12/28 0800) Pulse Rate: 101  (12/28 0900)  Labs:  Basename 08/05/12 0351 08/04/12 0340 08/03/12 0525  HGB 8.8* 7.7* --  HCT 27.7* 23.8* 24.5*  PLT 384 299 318  APTT -- -- --  LABPROT -- -- --  INR -- -- --  HEPARINUNFRC -- 0.44 0.34  CREATININE 0.75 -- 0.81  CKTOTAL -- -- --  CKMB -- -- --  TROPONINI -- -- --    Estimated Creatinine Clearance: 88.1 ml/min (by C-G formula based on Cr of 0.75).   Medical History: Past Medical History  Diagnosis Date  . Breast lump   . Thyroid disease   . Hypertension   . Hx of radiation therapy 06/2010, 10/2009, 11/2011    L breast, T3-T12 spine, L-S spine  . 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 Breast cancer 09/2009    L breast, ER+, PR-, Her 2 -  . History of radiation therapy 10/27/11-11/15/11    lumbar/sacral spine/18MV photons    Medications:  Scheduled:     . albuterol  2.5 mg Nebulization Q4H  . anastrozole  1 mg Oral Daily  . enoxaparin (LOVENOX) injection  1 mg/kg Subcutaneous BID  . fentaNYL  50 mcg Transdermal Q72H  . [COMPLETED] furosemide  40 mg Intravenous Once  . furosemide  40 mg Oral Daily  . levothyroxine  150 mcg Oral QAC breakfast  . megestrol  200 mg Oral Daily  . methylPREDNISolone (SOLU-MEDROL) injection  40 mg Intravenous Q12H  . metoprolol tartrate  12.5 mg Oral BID  . piperacillin-tazobactam  3.375 g Intravenous Q8H  . potassium chloride  20 mEq Oral BID  . scopolamine  1 patch Transdermal Q72H  . sodium chloride  3 mL Intravenous Q12H  . vancomycin  500 mg Intravenous Q8H   Infusions:     . 0.9 % NaCl with KCl 40 mEq / L 10 mL/hr (08/02/12 2841)    Assessment: Kristin [name] with  metastatic breast cancer, presents to ED with shortness of breath; CT shows bilateral PE.  IV heparin initially started, then transitioned to LMWH 12/25 Plt wnl, Hgb low, stable CrCl ~ 88 ml/min.    Goal of Therapy:  Anti-Xa level 0.6-1.2 units/ml 4hrs after LMWH dose given Monitor platelets by anticoagulation protocol: Yes   Plan:   Continue Lovenox 65mg  SQ q12h  Follow up CBC and renal function.  Follow up long term anticoagulation plans  Haynes Hoehn, PharmD 08/05/2012 9:12 AM  Pager: [phone]

## 2012-08-05 NOTE — Progress Notes (Addendum)
[facility] PROGRESS NOTE  Tawona Filsinger GNF:[id number] DOB: September 09, 1965 DOA: 07/31/2012 PCP: [name], MD  Brief narrative: Mrs. Belkin is a 46 year old woman with a past medical history of stage IV breast cancer with metastasis to liver and bone who was admitted to the hospital on 07/31/2012 with SVT (heart rate originally over 200) who was found to have bilateral pulmonary emboli on admission.  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 Refused blood draws for heparin monitoring so was switched to SQ Lovenox 08/02/12.  Will need anticoagulation long-term due to concomitant metastatic breast cancer, unless she opts for comfort care.  Echocardiogram done 08/01/12.  EF 60-65%, no evidence of RV pressure overload.  PEs are very small on imaging.  Seen by Dr. Darnelle Catalan 08/01/12.  Long-term anticoagulation may pose financial challenges since hospice not likely to cover newer anticoagulants that require little monitoring.   Respiratory failure felt to be multifactorial with PE, lymphangitic spread of breast cancer, pleural effusion all contributory. Stage IV breast cancer   Followed by Dr. Darnelle Catalan.   Patient remains a FULL code despite recommendations otherwise by Dr. Darnelle Catalan, who spoke with her about her CODE STATUS 08/01/12. Hospice nurse spoke with her 08/04/12, and she agreed to DNI/limited code status.  Continue Arimidex. Chest discomfort / elevated troponin / SVT / demand ischemia / NSTEMI  Likely due to the patient's pulmonary embolus and SVT resulting in demand ischemia. Status post adenosine. Heart rate improved.  EKG without any acute ST-T wave changes.  No wall motion abnormalities on 2-D Echo.  Malignant pleural effusions   May be contributing to the patient's dyspnea.  Pulmonary infiltrates   Noted in the right upper lobe Difficult  to differentiate infectious process versus lymphangitic tumor spread.  Followup final blood cultures which are negative to date.  Continue empiric vancomycin and Zosyn for now.  Continue nebulized bronchodilator therapy.  Solu-Medrol added 08/04/2012.  Respiratory status mildly improved with steroids.  CXR done 08/04/2012, stable. Syncope   Likely due to the patient's SVT--->back in sinus post adenosine.   Start metoprolol with hold parameters for low systolic blood pressure or pulse less than 70. Chronic pain   Continue intravenous hydromorphone for pain control.  Continue fentanyl patch 50 mcg every 72 hours.  Microcytic anemia   Ferritin 453, low iron at 23, TIBC 126, saturation ration 18, UIBC 103.  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  Hemoglobin drop noted but stable over the past 72 hours. Likely dilutional. Monitor. Elevated BNP  Likely from pulmonary embolism. No evidence of CHF on Echo.  Either represents volume overload or PE.  IV fluids minimized and given a dose of Lasix 08/02/2012 & 08/03/12. Will place on oral lasix daily.  Pro BNP improved. Hypokalemia  Repleted. Elevated LFTs  Likely from metastatic disease involving the liver. Hypothyroidism  Continue Synthroid. Anxiety/panic attack  Having frequent panic attacks and significant anxiety. Seems to be triggered by breathing treatments. Continue Ativan as needed. Diarrhea  C. difficile PCR negative.   Code Status: DNI, limited code. Family Communication: Son in room, but sleeping. Disposition Plan: Home with Hospice in next 48 hours, if stable.  Not a candidate for residential hospice due to limited code status.  Complex family dynamics.   Medical Consultants:  Dr. Ruthann Cancer, Oncology  Other Consultants:  None.  Anti-infectives:  Vancomycin 07/31/2012--->  Zosyn 07/31/2012--->  HPI/Subjective: Mrs. Dearman seems to be breathing better  today.  She still gets very dyspneic  with limited activity.  States her appetite is good.  Still having bouts of diarrhea, 2 stools documented over past 24 hours.  Objective: Filed Vitals:   08/04/12 2331 08/05/12 0000 08/05/12 0400 08/05/12 0416  BP:  117/84 116/82   Pulse:  96 93   Temp:  97.8 F (36.6 C) 97.9 F (36.6 C)   TempSrc:  Oral Oral   Resp:  12 14   Height:      Weight:      SpO2: 100% 99% 91% 94%    Intake/Output Summary (Last 24 hours) at 08/05/12 0708 Last data filed at 08/05/12 0500  Gross per 24 hour  Intake 1050.5 ml  Output   1775 ml  Net -724.5 ml    Exam: Gen:  Anxious Cardiovascular:  Tachycardic Respiratory:  Lungs coarse with bilateral rhonchi, tachypneic Gastrointestinal:  Abdomen soft, NT/ND, + BS Extremities:  1+ edema  Data Reviewed: Basic Metabolic Panel:  Lab 08/05/12 1610 08/03/12 0525 08/02/12 0400 08/01/12 0325 07/31/12 1445  NA 137 137 140 137 142  K 4.4 3.7 -- -- --  CL 102 106 110 105 106  CO2 28 24 23 26  --  GLUCOSE 178* 106* 141* 106* 107*  BUN 6 7 7 7 7   CREATININE 0.75 0.81 0.65 0.71 0.70  CALCIUM 8.5 8.1* 8.2* 8.3* --  MG -- -- -- -- --  PHOS -- -- -- -- --   GFR Estimated Creatinine Clearance: 88.1 ml/min (by C-G formula based on Cr of 0.75). Coagulation profile  Lab 07/31/12 1802  INR 1.25  PROTIME --    CBC:  Lab 08/05/12 0351 08/04/12 0340 08/03/12 0525 08/02/12 0400 08/01/12 0325 07/31/12 1425  WBC 6.1 5.7 6.3 5.9 6.4 --  NEUTROABS -- -- -- -- -- 5.0  HGB 8.8* 7.7* 7.7* 7.7* 7.7* --  HCT 27.7* 23.8* 24.5* 23.9* 23.6* --  MCV 78.5 77.8* 78.5 78.9 78.9 --  PLT 384 299 318 320 338 --   Cardiac Enzymes:  Lab 08/01/12 0325 07/31/12 2114 07/31/12 1426  CKTOTAL -- -- --  CKMB -- -- --  CKMBINDEX -- -- --  TROPONINI 0.48* <0.30 0.43*   BNP (last 3 results)  Basename 08/04/12 0340 07/31/12 1426  PROBNP 316.8* 1950.0*   Microbiology Recent Results (from the past 240 hour(s))  CULTURE, BLOOD (ROUTINE X 2)     Status: Normal (Preliminary  result)   Collection Time   07/31/12  2:26 PM      Component Value Range Status Comment   Specimen Description BLOOD LEFT ARM   Final    Special Requests BOTTLES DRAWN AEROBIC AND ANAEROBIC 5CC   Final    Culture  Setup Time 07/31/2012 21:42   Final    Culture     Final    Value:        BLOOD CULTURE RECEIVED NO GROWTH TO DATE CULTURE WILL BE HELD FOR 5 DAYS BEFORE ISSUING A FINAL NEGATIVE REPORT   Report Status PENDING   Incomplete   CULTURE, BLOOD (ROUTINE X 2)     Status: Normal (Preliminary result)   Collection Time   07/31/12  3:03 PM      Component Value Range Status Comment   Specimen Description BLOOD RIGHT HAND   Final    Special Requests BOTTLES DRAWN AEROBIC ONLY 5CC   Final    Culture  Setup Time 07/31/2012 21:42   Final    Culture  Final    Value:        BLOOD CULTURE RECEIVED NO GROWTH TO DATE CULTURE WILL BE HELD FOR 5 DAYS BEFORE ISSUING A FINAL NEGATIVE REPORT   Report Status PENDING   Incomplete   URINE CULTURE     Status: Normal   Collection Time   07/31/12  5:56 PM      Component Value Range Status Comment   Specimen Description URINE, RANDOM   Final    Special Requests NONE   Final    Culture  Setup Time 08/01/2012 01:59   Final    Colony Count 80,000 COLONIES/ML   Final    Culture     Final    Value: Multiple bacterial morphotypes present, none predominant. Suggest appropriate recollection if clinically indicated.   Report Status 08/01/2012 FINAL   Final   MRSA PCR SCREENING     Status: Normal   Collection Time   07/31/12  8:33 PM      Component Value Range Status Comment   MRSA by PCR NEGATIVE  NEGATIVE Final   CLOSTRIDIUM DIFFICILE BY PCR     Status: Normal   Collection Time   08/03/12  8:02 AM      Component Value Range Status Comment   C difficile by pcr NEGATIVE  NEGATIVE Final      Procedures and Diagnostic Studies: Ct Angio Chest W/cm &/or Wo Cm  07/31/2012  *RADIOLOGY REPORT*  Clinical Data: Breathing discomfort, increasing shortness of  breath, metastatic breast cancer, hypertension  CT ANGIOGRAPHY CHEST  Technique:  Multidetector CT imaging of the chest using the standard protocol during bolus administration of intravenous contrast. Multiplanar reconstructed images including MIPs were obtained and reviewed to evaluate the vascular anatomy.  Contrast: OMNIPAQUE IOHEXOL 350 MG/ML SOLN  Comparison: 05/24/2012  Findings: Aorta normal caliber without aneurysm or dissection. Metastatic lesion at medial aspect right lobe liver image 96 4.0 x 3.3 cm, previously 3.1 x 2.0 cm and lateral segment left lobe 7.0 x 4.5 cm previously 6.1 x 2.8 cm. Significant intrahepatic biliary dilatation. Question abnormal soft tissue and gastrohepatic ligament suspicious for tumor. New significant intrahepatic biliary dilatation. Thickening of the left adrenal gland and perihepatic ascites noted. Soft tissue nodule posterior to the upper pole of the left kidney 7 mm diameter unchanged.  Abnormal soft tissues identified throughout the anterior mediastinum, both hila, and subcarinal region compatible with tumor increased since previous exam. Multiple right retro pectoral nodes, slightly increased. Question adenopathy at the bases of the cervical regions bilaterally. Narrowing of the right upper lobe bronchi at the origins. Moderate sized bilateral pleural effusions greater on left.  Multiple pulmonary emboli identified within right lower lobe pulmonary arteries. Questionable small pulmonary embolus in the left lower lobe. Compression of the right upper right lower lobe pulmonary arteries by soft tissue at the left hilum. Extensive infiltrate identified in the right upper lobe, potentially lymphangitic tumor. Increased volume loss in the upper lobes. Additional areas of potential tumor are identified in the the right middle lobe and left upper lobe. Minimal compressive atelectasis in bilateral lower lobes. Numerous osseous metastases involving thoracic vertebrae and right  scapula, including a destructive lesion involving right glenoid with associated fragmentation.  IMPRESSION: Pulmonary emboli identified within right lower lobe pulmonary arteries and likely a small embolus in a left lower lobe pulmonary artery. Increased abnormal mediastinal and bilateral hilar soft tissue since previous exam compatible with tumor progression. Increased size of hepatic metastases and pulmonary tumor spread. Increased bilateral  pleural effusions. Osseous metastatic disease and right retro pectoral adenopathy.  Findings called to Dr. Manus Gunning  on 07/31/2029 at 1718 hours.   Original Report Authenticated By: Ulyses Southward, M.D.    Dg Chest Port 1 View  07/31/2012  *RADIOLOGY REPORT*  Clinical Data: Breast cancer and short of breath  PORTABLE CHEST - 1 VIEW  Comparison: CT chest 05/24/2012, chest x-ray 05/23/2012  Findings: Progression of prominent interstitial markings bilaterally especially in the right upper lobe and  right lower lobe.  Right perihilar mass lesion appears larger.  This may be due to lymphangitic spread of breast cancer.  Lung cancer not excluded.  Extensive pleural parenchymal scarring left upper lobe is unchanged.     Heart size is normal. Increase in left effusion and left lower lobe atelectasis.  IMPRESSION: Progression of prominent interstitial markings on the right with perihilar mass lesion.  This may be due to lymphangitic spread of tumor.  Increase in left lower lobe atelectasis and left effusion.  Extensive scarring in the left apex is stable.   Original Report Authenticated By: Janeece Riggers, M.D.    Dg Chest Port 1 View  08/04/2012  *RADIOLOGY REPORT*  Clinical Data: Possible health care associated pneumonia, pulmonary emboli, elevated proBNP  PORTABLE CHEST - 1 VIEW  Comparison: CTA chest dated 07/31/2012  Findings: Left apical pleural thickening/opacity.  Right upper lobe/paramediastinal opacity/mass.  Left basilar atelectasis with pleural effusion.  Known right  pleural effusion is not radiographically evident.  No pneumothorax.  The appearance unchanged from prior CT.  The heart is normal in size.  Sclerotic metastasis in the right scapula/glenoid.  IMPRESSION: Stable right upper lobe/paramediastinal opacity/mass.  Stable left apical pleural thickening/opacity.  Stable left basilar atelectasis with pleural effusion.   Original Report Authenticated By: Charline Bills, M.D.     Scheduled Meds:    . albuterol  2.5 mg Nebulization Q4H  . anastrozole  1 mg Oral Daily  . enoxaparin (LOVENOX) injection  1 mg/kg Subcutaneous BID  . fentaNYL  50 mcg Transdermal Q72H  . levothyroxine  150 mcg Oral QAC breakfast  . megestrol  200 mg Oral Daily  . methylPREDNISolone (SOLU-MEDROL) injection  40 mg Intravenous Q12H  . metoprolol tartrate  12.5 mg Oral BID  . piperacillin-tazobactam  3.375 g Intravenous Q8H  . potassium chloride  20 mEq Oral BID  . scopolamine  1 patch Transdermal Q72H  . sodium chloride  3 mL Intravenous Q12H  . vancomycin  500 mg Intravenous Q8H   Continuous Infusions:    . 0.9 % NaCl with KCl 40 mEq / L 10 mL/hr (08/02/12 0917)    Time spent: 35 minutes.   LOS: 5 days   Jackalyn Haith  [facility] Pager [phone].  If 8PM-8AM, please contact night-coverage at [email or web address], password [facility] 08/05/2012, 7:08 AM

## 2012-08-05 NOTE — Progress Notes (Signed)
ANTIBIOTIC CONSULT NOTE - FOLLOW UP  Pharmacy Consult for Vanc Indication: pneumonia  No Known Allergies  Patient Measurements: Height: 5' 9.5" (176.5 cm) Weight: 141 lb 8.6 oz (64.2 kg) IBW/kg (Calculated) : 67.35    Vital Signs: Temp: 97.9 F (36.6 C) (12/28 1200) Temp src: Oral (12/28 1200) BP: 125/72 mmHg (12/28 1235) Pulse Rate: 91  (12/28 1300) Intake/Output from previous day: 12/27 0701 - 12/28 0700 In: 1050.5 [P.O.:480; I.V.:263; IV Piggyback:237.5] Out: 1775 [Urine:1775] Intake/Output from this shift: Total I/O In: 310 [I.V.:60; IV Piggyback:250] Out: -   Labs:  Basename 08/05/12 0351 08/04/12 0340 08/03/12 0525  WBC 6.1 5.7 6.3  HGB 8.8* 7.7* 7.7*  PLT 384 299 318  LABCREA -- -- --  CREATININE 0.75 -- 0.81   Estimated Creatinine Clearance: 88.1 ml/min (by C-G formula based on Cr of 0.75).  Basename 08/05/12 1312 08/02/12 1910  VANCOTROUGH 19.1 21.7*  VANCOPEAK -- --  Drue Dun -- --  GENTTROUGH -- --  GENTPEAK -- --  GENTRANDOM -- --  TOBRATROUGH -- --  TOBRAPEAK -- --  TOBRARND -- --  AMIKACINPEAK -- --  AMIKACINTROU -- --  AMIKACIN -- --     Microbiology: Recent Results (from the past 720 hour(s))  CLOSTRIDIUM DIFFICILE BY PCR     Status: Normal   Collection Time   07/25/12 11:33 AM      Component Value Range Status Comment   C difficile by pcr Negative  Negative Final   CULTURE, BLOOD (ROUTINE X 2)     Status: Normal (Preliminary result)   Collection Time   07/31/12  2:26 PM      Component Value Range Status Comment   Specimen Description BLOOD LEFT ARM   Final    Special Requests BOTTLES DRAWN AEROBIC AND ANAEROBIC 5CC   Final    Culture  Setup Time 07/31/2012 21:42   Final    Culture     Final    Value:        BLOOD CULTURE RECEIVED NO GROWTH TO DATE CULTURE WILL BE HELD FOR 5 DAYS BEFORE ISSUING A FINAL NEGATIVE REPORT   Report Status PENDING   Incomplete   CULTURE, BLOOD (ROUTINE X 2)     Status: Normal (Preliminary result)   Collection Time   07/31/12  3:03 PM      Component Value Range Status Comment   Specimen Description BLOOD RIGHT HAND   Final    Special Requests BOTTLES DRAWN AEROBIC ONLY 5CC   Final    Culture  Setup Time 07/31/2012 21:42   Final    Culture     Final    Value:        BLOOD CULTURE RECEIVED NO GROWTH TO DATE CULTURE WILL BE HELD FOR 5 DAYS BEFORE ISSUING A FINAL NEGATIVE REPORT   Report Status PENDING   Incomplete   URINE CULTURE     Status: Normal   Collection Time   07/31/12  5:56 PM      Component Value Range Status Comment   Specimen Description URINE, RANDOM   Final    Special Requests NONE   Final    Culture  Setup Time 08/01/2012 01:59   Final    Colony Count 80,000 COLONIES/ML   Final    Culture     Final    Value: Multiple bacterial morphotypes present, none predominant. Suggest appropriate recollection if clinically indicated.   Report Status 08/01/2012 FINAL   Final   MRSA PCR SCREENING  Status: Normal   Collection Time   07/31/12  8:33 PM      Component Value Range Status Comment   MRSA by PCR NEGATIVE  NEGATIVE Final   CLOSTRIDIUM DIFFICILE BY PCR     Status: Normal   Collection Time   08/03/12  8:02 AM      Component Value Range Status Comment   C difficile by pcr NEGATIVE  NEGATIVE Final     Anti-infectives     Start     Dose/Rate Route Frequency Ordered Stop   08/02/12 2200   vancomycin (VANCOCIN) 500 mg in sodium chloride 0.9 % 100 mL IVPB        500 mg 100 mL/hr over 60 Minutes Intravenous Every 8 hours 08/02/12 2018     08/01/12 2000   vancomycin (VANCOCIN) 750 mg in sodium chloride 0.9 % 150 mL IVPB  Status:  Discontinued        750 mg 150 mL/hr over 60 Minutes Intravenous Every 8 hours 08/01/12 1043 08/02/12 2018   08/01/12 1045   vancomycin (VANCOCIN) 750 mg in sodium chloride 0.9 % 150 mL IVPB        750 mg 150 mL/hr over 60 Minutes Intravenous  Once 08/01/12 1042 08/01/12 1314   07/31/12 2200   piperacillin-tazobactam (ZOSYN) IVPB 3.375 g          3.375 g 12.5 mL/hr over 4 Hours Intravenous 3 times per day 07/31/12 2041     07/31/12 2200   vancomycin (VANCOCIN) 1,500 mg in sodium chloride 0.9 % 500 mL IVPB  Status:  Discontinued        1,500 mg 250 mL/hr over 120 Minutes Intravenous Every 12 hours 07/31/12 2052 08/01/12 1041          Assessment:  46 YOF on day 6 Vancomycin and Zosyn for pulm infiltrates, infx vs tumor spread.    Scr wnl/stable  WBC wnl, Afebrile  Ucx 12/23 = 80Kcol/ml mult morphotypes, Bcx x 2 12/23 NGTD  VT therapeutic @ 19.1  Goal of Therapy:  Vancomycin trough level 15-20 mcg/ml  Plan:   Continue Vancomcyin 500mg  IV q 8 h  Adjust dose as appropriate  Follow up duration of Tx per MD  Haynes Hoehn, PharmD 08/05/2012 2:13 PM  Pager: [phone]

## 2012-08-05 NOTE — Progress Notes (Signed)
Related admission.  Patient in bed with respiratory therapy at the bedside.  Spoke with Victorino Dike, RN for patient.  Patient's breathing has improved some and there is a possibility patient will be discharged home in the next couple of days.  No family present at bedside.  Please call [facility] [phone] with any questions or concerns.   Willette Pa, RN [facility]

## 2012-08-06 DIAGNOSIS — C7800 Secondary malignant neoplasm of unspecified lung: Secondary | ICD-10-CM

## 2012-08-06 DIAGNOSIS — C801 Malignant (primary) neoplasm, unspecified: Secondary | ICD-10-CM

## 2012-08-06 DIAGNOSIS — J9600 Acute respiratory failure, unspecified whether with hypoxia or hypercapnia: Secondary | ICD-10-CM

## 2012-08-06 LAB — CULTURE, BLOOD (ROUTINE X 2): Culture: NO GROWTH

## 2012-08-06 LAB — BASIC METABOLIC PANEL
Calcium: 8.3 mg/dL — ABNORMAL LOW (ref 8.4–10.5)
Chloride: 100 mEq/L (ref 96–112)
Creatinine, Ser: 0.77 mg/dL (ref 0.50–1.10)
GFR calc Af Amer: >90 mL/min (ref >90)
Sodium: 135 mEq/L (ref 135–145)

## 2012-08-06 LAB — CBC
MCH: 25.2 pg — ABNORMAL LOW (ref 26.0–34.0)
MCV: 78.6 fL (ref 78.0–100.0)
Platelets: 396 10*3/uL (ref 150–400)
RDW: 22.6 % — ABNORMAL HIGH (ref 11.5–15.5)
WBC: 8.4 10*3/uL (ref 4.0–10.5)

## 2012-08-06 MED ORDER — GUAIFENESIN-DM 100-10 MG/5ML PO SYRP: 5.0000 mL | ORAL_SOLUTION | ORAL | Status: DC | PRN

## 2012-08-06 MED ORDER — FUROSEMIDE 40 MG PO TABS: 40.0000 mg | ORAL_TABLET | Freq: Every day | ORAL | Status: DC

## 2012-08-06 MED ORDER — PREDNISONE 50 MG PO TABS: ORAL_TABLET | ORAL | Status: DC

## 2012-08-06 MED ORDER — ALBUTEROL SULFATE (5 MG/ML) 0.5% IN NEBU
2.5000 mg | INHALATION_SOLUTION | Freq: Four times a day (QID) | RESPIRATORY_TRACT | Status: DC
  Administered 2012-08-06 – 2012-08-08 (×11): 2.5 mg via RESPIRATORY_TRACT
  Filled 2012-08-06 (×10): qty 0.5

## 2012-08-06 MED ORDER — ENOXAPARIN SODIUM 60 MG/0.6ML ~~LOC~~ SOLN: 60.0000 mg | Freq: Two times a day (BID) | SUBCUTANEOUS | Status: DC

## 2012-08-06 MED ORDER — LEVOFLOXACIN 750 MG PO TABS: 750.0000 mg | ORAL_TABLET | Freq: Every day | ORAL | Status: DC

## 2012-08-06 MED ORDER — ALBUTEROL SULFATE (5 MG/ML) 0.5% IN NEBU: 2.5000 mg | INHALATION_SOLUTION | Freq: Four times a day (QID) | RESPIRATORY_TRACT | Status: DC | PRN

## 2012-08-06 MED ORDER — BIOTENE DRY MOUTH MT LIQD
15.0000 mL | Freq: Two times a day (BID) | OROMUCOSAL | Status: DC
  Administered 2012-08-06 – 2012-08-08 (×5): 15 mL via OROMUCOSAL

## 2012-08-06 MED ORDER — FENTANYL 50 MCG/HR TD PT72: 1.0000 | MEDICATED_PATCH | TRANSDERMAL | Status: DC

## 2012-08-06 MED ORDER — HYDROMORPHONE HCL PF 2 MG/ML IJ SOLN
3.0000 mg | INTRAMUSCULAR | Status: DC | PRN
  Administered 2012-08-06: 3 mg via INTRAVENOUS
  Administered 2012-08-06: 2 mg via INTRAVENOUS
  Administered 2012-08-06: 3 mg via INTRAVENOUS
  Administered 2012-08-06: 2 mg via INTRAVENOUS
  Administered 2012-08-07 – 2012-08-08 (×9): 3 mg via INTRAVENOUS
  Filled 2012-08-06 (×5): qty 2
  Filled 2012-08-06: qty 1
  Filled 2012-08-06 (×7): qty 2

## 2012-08-06 MED ORDER — LORAZEPAM 1 MG PO TABS: 1.0000 mg | ORAL_TABLET | Freq: Every day | ORAL | Status: DC

## 2012-08-06 MED ORDER — HYDROMORPHONE HCL 4 MG PO TABS: 4.0000 mg | ORAL_TABLET | ORAL | Status: DC | PRN

## 2012-08-06 NOTE — Progress Notes (Signed)
Related admission.  Patient transferred this am from ICU.  Patient reports breathing is better but she is complaining of pain in her chest.  Charline Bills, RN aware of patient's request for pain medication.  Patient also would like clarification on her status and if the cancer has spread to her lungs.  Primary RN also informed of this request.  No family present at bedside.  Please call [facility] with any questions or concerns.  Willette Pa, RN [facility]

## 2012-08-06 NOTE — Progress Notes (Signed)
[facility] PROGRESS NOTE  Kristin [name] WJX:[id number] DOB: 31-Mar-1966 DOA: 07/31/2012 PCP: [name], MD  Brief narrative: 46 year old woman with a past medical history of stage IV breast cancer with metastasis to liver and bone who was admitted to the hospital on 07/31/2012 with SVT (heart rate originally over 200) and was found to have bilateral pulmonary emboli on admission.   Assessment/Plan:   *Principal Problem:  Acute respiratory failure secondary to lymphangitic lung metastasis and Pulmonary embolism  Secondary to PE, lymphangitic spread of breast cancer and malignant pleural effusion/bilateral PE's Initially on heparin but then has refused blood draws for heparin monitoring so was switched to SQ Lovenox 08/02/12. Will need long term anticoagulation Echocardiogram done 08/01/12. EF 60-65%, no evidence of RV pressure overload. PEs are very small on imaging.  Seen by Dr. Darnelle Catalan 08/01/12. Long-term anticoagulation may pose financial challenges since hospice not likely to cover newer anticoagulants that require little monitoring.   Active Problems: Stage IV breast cancer  Followed by Dr. Darnelle Catalan.  DNI only Continue Arimidex. Chest discomfort / elevated troponin / SVT / demand ischemia / NSTEMI  Likely due to the patient's pulmonary embolus and SVT resulting in demand ischemia. Status post adenosine with HR now controlled 86-100  EKG without any acute ST-T wave changes. No wall motion abnormalities on 2-D Echo.  Pulmonary infiltrates  Noted in the right upper lobe, infectious process versus lymphangitic tumor spread.  Followup final blood cultures negative to date Continue empiric Vancomycin and Zosyn   Continue nebulized bronchodilator therapy. Solu-Medrol added 08/04/2012, 40 mg Q 12 hours CXR done 08/04/2012, stable. Syncope  Likely due to the patient's SVT--->back in sinus post adenosine.  Continue metoprolol 12.5 mg BID Chronic pain  Continue  intravenous hydromorphone for pain control. Increwased to 3 mg Q 3 hours PRN for better pain control Continue fentanyl patch 50 mcg every 72 hours.  Anemia of chronic disease Ferritin 453, low iron at 23, TIBC 126, saturation ration 18, UIBC 103. 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  Hemoglobin stable, no signs of active bleed Elevated BNP  Likely from pulmonary embolism. No evidence of CHF on ECHO. Either represents volume overload or PE.  IV fluids minimized and given a dose of Lasix 08/02/2012 & 08/03/12. Continue lasix 40 mg PO daily BNP improving Hypokalemia  Repleted. Elevated LFTs  Likely from metastatic disease involving the liver. Hypothyroidism  Continue Synthroid. Anxiety/panic attack  Ativan PRN Diarrhea  C. difficile PCR negative.   Code Status: DNI, limited code.  Family Communication: no family at bedside  Disposition Plan: Home with Hospice in am if stable. Not a candidate for residential hospice due to limited code status. Complex family dynamics.   Medical Consultants:  Dr. Ruthann Cancer, Oncology Other Consultants:  None. Anti-infectives:  Vancomycin 07/31/2012--->  Zosyn 07/31/2012--->  Manson Passey, MD  [facility] Pager [phone]  If 7PM-7AM, please contact night-coverage [email or web address] Password [facility] 08/06/2012, 12:57 PM   LOS: 6 days   HPI/Subjective: No acute overnight events.  Objective: Filed Vitals:   08/06/12 0800 08/06/12 0830 08/06/12 0858 08/06/12 1020  BP:  126/73  124/84  Pulse:  86  100  Temp: 97.5 F (36.4 C)   98.5 F (36.9 C)  TempSrc: Axillary   Oral  Resp:  18  16  Height:      Weight:      SpO2:  100% 99% 100%    Intake/Output Summary (Last 24 hours) at 08/06/12 1257 Last data filed at 08/06/12 1200  Gross  per 24 hour  Intake  962.5 ml  Output   1150 ml  Net -187.5 ml    Exam:   General:  Pt is alert, follows commands appropriately, not in acute distress  Cardiovascular: Regular rate and rhythm, S1/S2  appreciated  Respiratory: rhonchi in upper and mid lobes; poor respiratory effort   Abdomen: Soft, non tender, non distended, bowel sounds present, no guarding  Extremities: No edema, pulses DP and PT palpable bilaterally  Neuro: Grossly nonfocal  Data Reviewed: Basic Metabolic Panel:  Lab 08/06/12 4098 08/05/12 0351 08/03/12 0525 08/02/12 0400 08/01/12 0325  NA 135 137 137 140 137  K 4.2 4.4 3.7 3.1* 3.4*  CL 100 102 106 110 105  CO2 26 28 24 23 26   GLUCOSE 153* 178* 106* 141* 106*  BUN 8 6 7 7 7   CREATININE 0.77 0.75 0.81 0.65 0.71  CALCIUM 8.3* 8.5 8.1* 8.2* 8.3*   CBC:  Lab 08/06/12 0333 08/05/12 0351 08/04/12 0340 08/03/12 0525 08/02/12 0400 07/31/12 1425  WBC 8.4 6.1 5.7 6.3 5.9 --  NEUTROABS -- -- -- -- -- 5.0  HGB 8.5* 8.8* 7.7* 7.7* 7.7* --  HCT 26.5* 27.7* 23.8* 24.5* 23.9* --  MCV 78.6 78.5 77.8* 78.5 78.9 --  PLT 396 384 299 318 320 --   Cardiac Enzymes:  Lab 08/01/12 0325 07/31/12 2114 07/31/12 1426  TROPONINI 0.48* <0.30 0.43*    CULTURE, BLOOD (ROUTINE X 2)     Status: Normal (Preliminary result)   Collection Time   07/31/12  2:26 PM      Component Value Range Status Comment   Culture     Final    Value:        BLOOD CULTURE RECEIVED NO GROWTH TO DATE    Report Status PENDING   Incomplete   CULTURE, BLOOD (ROUTINE X 2)     Status: Normal (Preliminary result)   Collection Time   07/31/12  3:03 PM      Component Value Range Status Comment   Culture     Final    Value:        BLOOD CULTURE RECEIVED NO GROWTH TO DATE    Report Status PENDING   Incomplete   URINE CULTURE     Status: Normal   Collection Time   07/31/12  5:56 PM      Component Value Range Status Comment   Specimen Description URINE   Final    Value: Multiple bacterial morphotypes present, none predominant. Suggest appropriate recollection if clinically indicated.   Report Status 08/01/2012 FINAL   Final   MRSA PCR SCREENING     Status: Normal   Collection Time   07/31/12  8:33 PM        Component Value Range Status Comment   MRSA by PCR NEGATIVE  NEGATIVE Final   CLOSTRIDIUM DIFFICILE BY PCR     Status: Normal   Collection Time   08/03/12  8:02 AM      Component Value Range Status Comment   C difficile by pcr NEGATIVE  NEGATIVE Final      Studies: No results found.  Scheduled Meds:  . albuterol  2.5 mg Nebulization Q6H  . anastrozole  1 mg Oral Daily  . enoxaparin (LOVENOX)  1 mg/kg Subcutaneous BID  . fentaNYL  50 mcg Transdermal Q72H  . furosemide  40 mg Oral Daily  . levothyroxine  150 mcg Oral QAC breakfast  . megestrol  200 mg Oral Daily  . methylPREDNISolone  40 mg Intravenous Q12H  . metoprolol tartrate  12.5 mg Oral BID  . piperacillin-tazobactam  3.375 g Intravenous Q8H  . potassium chloride  20 mEq Oral BID  . scopolamine  1 patch Transdermal Q72H  . vancomycin  500 mg Intravenous Q8H   Continuous Infusions:   . 0.9 % NaCl with KCl 40 mEq / L 20 mL/hr (08/06/12 4098)

## 2012-08-07 ENCOUNTER — Other Ambulatory Visit: Payer: Self-pay | Admitting: Oncology

## 2012-08-07 DIAGNOSIS — E876 Hypokalemia: Secondary | ICD-10-CM

## 2012-08-07 DIAGNOSIS — J189 Pneumonia, unspecified organism: Secondary | ICD-10-CM

## 2012-08-07 LAB — CBC
Platelets: 406 10*3/uL — ABNORMAL HIGH (ref 150–400)
RBC: 3.57 MIL/uL — ABNORMAL LOW (ref 3.87–5.11)
RDW: 23 % — ABNORMAL HIGH (ref 11.5–15.5)
WBC: 8.4 10*3/uL (ref 4.0–10.5)

## 2012-08-07 MED ORDER — LEVOFLOXACIN 750 MG PO TABS
750.0000 mg | ORAL_TABLET | Freq: Every day | ORAL | Status: DC
  Administered 2012-08-07 – 2012-08-08 (×2): 750 mg via ORAL
  Filled 2012-08-07 (×2): qty 1

## 2012-08-07 MED ORDER — LEVOTHYROXINE SODIUM 150 MCG PO TABS: 150.0000 ug | ORAL_TABLET | Freq: Every day | ORAL | Status: DC

## 2012-08-07 MED ORDER — PREDNISONE 50 MG PO TABS
50.0000 mg | ORAL_TABLET | Freq: Every day | ORAL | Status: DC
  Administered 2012-08-08: 50 mg via ORAL
  Filled 2012-08-07 (×2): qty 1

## 2012-08-07 NOTE — Care Management Note (Signed)
    Page 1 of 2   08/08/2012     2:08:55 PM   CARE MANAGEMENT NOTE 08/08/2012  Patient:  Kristin [name], Kristin [name]   Account Number:  [id number]  Date Initiated:  08/07/2012  Documentation initiated by:  [facility]  Subjective/Objective Assessment:   ADMITTED W/SOB.PE.HX:BREAST CA W/METS.     Action/Plan:   FROM HOME.ACTIVE W/[facility].HAS PCP,PHARMACY.   Anticipated DC Date:  08/08/2012   Anticipated DC Plan:  HOME W HOSPICE CARE      DC Planning Services  CM consult      [facility] Choice  Resumption Of Svcs/PTA Provider   Choice offered to / List presented to:             Status of service:  Completed, signed off Medicare Important Message given?   (If response is "NO", the following Medicare IM given date fields will be blank) Date Medicare IM given:   Date Additional Medicare IM given:    Discharge Disposition:  HOME W HOSPICE CARE  Per UR Regulation:  Reviewed for med. necessity/level of care/duration of stay  If discussed at Long Length of Stay Meetings, dates discussed:   08/08/2012    Comments:  08/08/12 Ozelle Brubacher RN,BSN NCM [phone] [facility] AWARE OF D/C HOME TODAY.02 SATS WERE 85%,PATIENT ALREADY HAS HOME 02,BUT NEEDS TRAVEL TANK.CONTACTED [facility]) INFORMED OF TRAVEL TANK.CONCERNS ARE THAT PATIENT WILL HAVE A SAFE D/C TO HOME.NURSE WILL F/U W/ PERSON TO PICK PATIENT UP TO TRANSP HOME,& CONTACT MD IF TRANSP HOME SEEMS NOT SAFE,& RECOMMEND AMBULANCE IF NEEDED.[facility] HOME 02 TRAVEL TANK ALREADY IN RM. 08/07/12 Serai Tukes RN,BSN NCM [phone] [facility] ALREADY FOLLOWING FOR RESUMPTION OF SERVICES @ D/C.NOTED MD CONCERN OF LONG TERM ANTICOAGULATION:PATIENT HAS SCRIPT COVERAGE,PER [facility])  THEY WILL FOLLOW IF LABS ARE NEEDED TO BE DRAWN.

## 2012-08-07 NOTE — Progress Notes (Signed)
[facility] PROGRESS NOTE  Daliya Parchment ZOX:[id number] DOB: 05-16-1966 DOA: 07/31/2012 PCP: [name], MD  Brief narrative: 46 year old woman with a past medical history of stage IV breast cancer with metastasis to liver and bone who was admitted to the hospital on 07/31/2012 with SVT (heart rate originally over 200) and was found to have bilateral pulmonary emboli on admission. Patient continues to have rhonchi and poor breathing effort. She is still not able to go home today. I talked to her about her code status again but she was to tired to hold conversation. She did however express to me that she wants to go home when she feels little bit better.  Assessment/Plan:   *Principal Problem:  Acute respiratory failure secondary to lymphangitic lung metastasis and Pulmonary embolism  Secondary to combination of PE, lymphangitic spread of breast cancer, malignant pleural effusion Initially on heparin but then she has refused blood draws for heparin monitoring so was switched to SQ Lovenox 08/02/12. Will need long term anticoagulation  Echocardiogram done 08/01/12. EF 60-65%, no evidence of RV pressure overload. PEs are very small on imaging.  Seen by Dr. Darnelle Catalan 08/01/12. Long-term anticoagulation may pose financial challenges since hospice not likely to cover newer anticoagulants that require little monitoring.   Active Problems:  Stage IV breast cancer  Followed by Dr. Darnelle Catalan who recommended hospice care DNI only  Continue Arimidex. Chest discomfort / elevated troponin / SVT / demand ischemia / NSTEMI  Likely due to the patient's pulmonary embolus and SVT resulting in demand ischemia. Status post adenosine and now rate controlled EKG without any acute ST-T wave changes. No wall motion abnormalities on 2-D Echo.  Pulmonary infiltrates  Noted in the right upper lobe, infectious process versus lymphangitic tumor spread.  Followup final blood cultures negative to date  We  will discontinue vanco and zosyn today and start Levaquin PO in hopes of discharging the patient tomorrow to complete few more days of Levaquin Continue nebulized bronchodilator therapy. Solu-Medrol added 08/04/2012, 40 mg Q 12 hours. We have discontinued solumedrol today and started prednisone 50 mg daily  CXR done 08/04/2012, stable. Syncope  Likely due to the patient's SVT--->back in sinus post adenosine.  Continue metoprolol 12.5 mg BID Chronic pain  Continue intravenous hydromorphone for pain control. Increwased to 3 mg Q 3 hours PRN for better pain control  Continue fentanyl patch 50 mcg every 72 hours.  Anemia of chronic disease  Ferritin 453, low iron at 23, TIBC 126, saturation ration 18, UIBC 103. 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  Hemoglobin stable, no signs of active bleed Elevated BNP  Likely from pulmonary embolism. No evidence of CHF on ECHO. Either represents volume overload or PE.  IV fluids minimized and given a dose of Lasix 08/02/2012 & 08/03/12. Continue lasix 40 mg PO daily  BNP improving Hypokalemia  Repleted. Elevated LFTs  Likely from metastatic disease involving the liver. Hypothyroidism  Continue Synthroid. Anxiety/panic attack  Ativan PRN Diarrhea  C. difficile PCR negative.   Code Status: DNI, limited code.  Family Communication: no family at bedside  Disposition Plan: Home with Hospice in am if she feels better.Not a candidate for residential hospice due to limited code status. Complex family dynamics.    Medical Consultants:  Dr. Ruthann Cancer, Oncology Other Consultants:  None. Anti-infectives:  Vancomycin 07/31/2012---> 08/07/2012 Zosyn 07/31/2012---> 08/07/2012 Levaquin 08/07/2012 -->   Manson Passey, MD  [facility]  Pager [phone]   If 7PM-7AM, please contact night-coverage [email or web address] Password [facility] 08/07/2012, 10:32  AM   LOS: 7 days   HPI/Subjective: Still congested with cough.  Objective: Filed Vitals:   08/06/12  2250 08/07/12 0125 08/07/12 0520 08/07/12 0838  BP: 134/82  113/72   Pulse: 100 88 103   Temp: 97.1 F (36.2 C)  98.6 F (37 C)   TempSrc: Oral  Oral   Resp: 20 20 20    Height:      Weight:      SpO2: 90% 95% 98% 90%    Intake/Output Summary (Last 24 hours) at 08/07/12 1032 Last data filed at 08/07/12 0520  Gross per 24 hour  Intake 913.33 ml  Output      0 ml  Net 913.33 ml    Exam:   General:  Pt appears very ill, no acute distress, cachectic  Cardiovascular: Regular rate and rhythm, S1/S2, no murmurs, no rubs, no gallops  Respiratory: still congested with rhonchi, poor respiratory effort  Abdomen: Soft, non tender, non distended, bowel sounds present, no guarding  Extremities: No edema, pulses DP and PT palpable bilaterally  Neuro: Grossly nonfocal  Data Reviewed: Basic Metabolic Panel:  Lab 08/06/12 5409 08/05/12 0351 08/03/12 0525 08/02/12 0400 08/01/12 0325  NA 135 137 137 140 137  K 4.2 4.4 3.7 3.1* 3.4*  CL 100 102 106 110 105  CO2 26 28 24 23 26   GLUCOSE 153* 178* 106* 141* 106*  BUN 8 6 7 7 7   CREATININE 0.77 0.75 0.81 0.65 0.71  CALCIUM 8.3* 8.5 8.1* 8.2* 8.3*  MG -- -- -- -- --  PHOS -- -- -- -- --   Liver Function Tests: No results found for this basename: AST:5,ALT:5,ALKPHOS:5,BILITOT:5,PROT:5,ALBUMIN:5 in the last 168 hours No results found for this basename: LIPASE:5,AMYLASE:5 in the last 168 hours No results found for this basename: AMMONIA:5 in the last 168 hours CBC:  Lab 08/07/12 0435 08/06/12 0333 08/05/12 0351 08/04/12 0340 08/03/12 0525 07/31/12 1425  WBC 8.4 8.4 6.1 5.7 6.3 --  NEUTROABS -- -- -- -- -- 5.0  HGB 8.8* 8.5* 8.8* 7.7* 7.7* --  HCT 28.3* 26.5* 27.7* 23.8* 24.5* --  MCV 79.3 78.6 78.5 77.8* 78.5 --  PLT 406* 396 384 299 318 --   Cardiac Enzymes:  Lab 08/01/12 0325 07/31/12 2114 07/31/12 1426  CKTOTAL -- -- --  CKMB -- -- --  CKMBINDEX -- -- --  TROPONINI 0.48* <0.30 0.43*   BNP: No components found with this  basename: POCBNP:5 CBG: No results found for this basename: GLUCAP:5 in the last 168 hours  Recent Results (from the past 240 hour(s))  CULTURE, BLOOD (ROUTINE X 2)     Status: Normal   Collection Time   07/31/12  2:26 PM      Component Value Range Status Comment   Specimen Description BLOOD LEFT ARM   Final    Special Requests BOTTLES DRAWN AEROBIC AND ANAEROBIC 5CC   Final    Culture  Setup Time 07/31/2012 21:42   Final    Culture NO GROWTH 5 DAYS   Final    Report Status 08/06/2012 FINAL   Final   CULTURE, BLOOD (ROUTINE X 2)     Status: Normal   Collection Time   07/31/12  3:03 PM      Component Value Range Status Comment   Specimen Description BLOOD RIGHT HAND   Final    Special Requests BOTTLES DRAWN AEROBIC ONLY 5CC   Final    Culture  Setup Time 07/31/2012 21:42   Final  Culture NO GROWTH 5 DAYS   Final    Report Status 08/06/2012 FINAL   Final   URINE CULTURE     Status: Normal   Collection Time   07/31/12  5:56 PM      Component Value Range Status Comment   Specimen Description URINE, RANDOM   Final    Special Requests NONE   Final    Culture  Setup Time 08/01/2012 01:59   Final    Colony Count 80,000 COLONIES/ML   Final    Culture     Final    Value: Multiple bacterial morphotypes present, none predominant. Suggest appropriate recollection if clinically indicated.   Report Status 08/01/2012 FINAL   Final   MRSA PCR SCREENING     Status: Normal   Collection Time   07/31/12  8:33 PM      Component Value Range Status Comment   MRSA by PCR NEGATIVE  NEGATIVE Final   CLOSTRIDIUM DIFFICILE BY PCR     Status: Normal   Collection Time   08/03/12  8:02 AM      Component Value Range Status Comment   C difficile by pcr NEGATIVE  NEGATIVE Final      Studies: No results found.  Scheduled Meds:   . albuterol  2.5 mg Nebulization Q6H  . anastrozole  1 mg Oral Daily  . antiseptic oral rinse  15 mL Mouth Rinse BID  . enoxaparin (LOVENOX) injection  1 mg/kg  Subcutaneous BID  . fentaNYL  50 mcg Transdermal Q72H  . furosemide  40 mg Oral Daily  . levofloxacin  750 mg Oral Daily  . levothyroxine  150 mcg Oral QAC breakfast  . megestrol  200 mg Oral Daily  . metoprolol tartrate  12.5 mg Oral BID  . potassium chloride  20 mEq Oral BID  . predniSONE  50 mg Oral Q breakfast  . scopolamine  1 patch Transdermal Q72H  . sodium chloride  3 mL Intravenous Q12H   Continuous Infusions:   . 0.9 % NaCl with KCl 40 mEq / L 10 mL/hr at 08/07/12 0520

## 2012-08-07 NOTE — Telephone Encounter (Signed)
Called pharmacy to notify them this patient is in hospital at this time and should not be auto-filled. Under hospice services, 30day max fill.

## 2012-08-07 NOTE — Progress Notes (Addendum)
Room 1413 - Kristin [name] - [facility] RN Visit-R.Charon Smedberg RN  Related admission to [facility] diagnosis of metastatic breast cancer.  Pt is limited code - DNI only.    Pt alert & oriented, sitting up in bed, with complaints of BLE pain and states she has been medicated for this today. No family present.   Per chart notes from yesterday 12/29, pt may be discharged home today with hospice continuing home care services.  Discussed with staff RN Irving Burton - reviewed CT Chest dated 05/2012, Nuc med scan dated 10/2011 and CT angio from this admission - pt had questions about spread of disease - staff RN able to answer questions for patient.   Please call [facility] @ [phone]- ask for RN Liaison or after hours,ask for on-call RN with any hospice needs.    Thank you.  [name], RN  [facility]  Hospice Liaison

## 2012-08-07 NOTE — Addendum Note (Signed)
Addended by: Laroy Apple E on: 08/07/2012 04:50 PM   Modules accepted: Orders

## 2012-08-08 DIAGNOSIS — E871 Hypo-osmolality and hyponatremia: Secondary | ICD-10-CM

## 2012-08-08 LAB — CBC
HCT: 27.2 % — ABNORMAL LOW (ref 36.0–46.0)
Hemoglobin: 8.6 g/dL — ABNORMAL LOW (ref 12.0–15.0)
MCH: 25 pg — ABNORMAL LOW (ref 26.0–34.0)
MCV: 79.1 fL (ref 78.0–100.0)
RBC: 3.44 MIL/uL — ABNORMAL LOW (ref 3.87–5.11)

## 2012-08-08 MED ORDER — ALBUTEROL (5 MG/ML) CONTINUOUS INHALATION SOLN: 2.5000 mg/h | INHALATION_SOLUTION | RESPIRATORY_TRACT | Status: DC | PRN

## 2012-08-08 MED ORDER — ALBUTEROL SULFATE (5 MG/ML) 0.5% IN NEBU
2.5000 mg | INHALATION_SOLUTION | RESPIRATORY_TRACT | Status: DC | PRN
  Filled 2012-08-08: qty 0.5

## 2012-08-08 MED ORDER — ENOXAPARIN SODIUM 80 MG/0.8ML ~~LOC~~ SOLN
1.0000 mg/kg | Freq: Two times a day (BID) | SUBCUTANEOUS | Status: DC
  Administered 2012-08-08: 65 mg via SUBCUTANEOUS
  Filled 2012-08-08 (×2): qty 0.8

## 2012-08-08 MED ORDER — ENOXAPARIN SODIUM 100 MG/ML ~~LOC~~ SOLN: 100.0000 mg | SUBCUTANEOUS | Status: DC

## 2012-08-08 MED ORDER — HYDROMORPHONE HCL 2 MG PO TABS
3.0000 mg | ORAL_TABLET | Freq: Once | ORAL | Status: AC
  Administered 2012-08-08: 3 mg via ORAL
  Filled 2012-08-08: qty 2

## 2012-08-08 NOTE — Progress Notes (Signed)
Room 1413 - Rolla Etienne - [facility] RN Visit-R.Latonya Knight RN  Related admission to [facility] diagnosis of metastatic breast cancer.  Pt is partial code-DNI.    Pt alert, lying in bed, with complaints of BLE pain.   No family present.   Pt continues to remove the [place] for 02 - replaced x2 during visit.  [facility] rep brought travel tank for pt to use traveling home from hospital at discharge today.  Pt's mother is supposed to come around 2pm today and transport pt via private auto.  Pt states wheelchair provided by [facility] is too large to fit in the car - advised it is light weight and should fit in trunk of car - pt admits borrowed car is a small car.   Pt denied having 02 set up in home at beginning of visit and later recounted that she did have concentrator and tanks.  [facility] confirmed pt has full 02 set up with nebulizer and suction.    Discussed with staff RN and [facility] RN issues of safety for pt transportation home.  Will discuss with pt's mother when she arrives for discharge.   AVS faxed to [facility].  Patient's home medication list is on shadow chart.   Please call [facility] @ [phone]- ask for RN Liaison or after hours,ask for on-call RN with any hospice needs.    Thank you.  [name], RN  [facility]  Hospice Liaison

## 2012-08-08 NOTE — Discharge Summary (Signed)
Physician Discharge Summary  Kristin [name] NWG:[id number] DOB: May 26, 1966 DOA: 07/31/2012  PCP: [name], MD  Admit date: 07/31/2012 Discharge date: 08/08/2012  Recommendations for Outpatient Follow-up:  1. Home with hospice  Discharge Diagnoses:  Principal Problem:  *Pulmonary emboli Active Problems:  Breast cancer metastasized to multiple sites  Anxiety  Elevated troponin  Syncope  Malignant pleural effusion  Pulmonary infiltrates  Chronic pain  Microcytic anemia  Hypokalemia  Elevated LFTs  Elevated brain natriuretic peptide (BNP) level  SVT (supraventricular tachycardia)  Hypothyroidism  NSTEMI (non-ST elevated myocardial infarction)  Panic attack  Diarrhea  Acute respiratory failure  Lymphangitic lung metastasis  Discharge Condition: medically stable for discharge home today with home hospice  Diet recommendation: as tolerated  History of present illness:  46 year old woman with a past medical history of stage IV breast cancer with metastasis to liver and bone who was admitted to the hospital on 07/31/2012 with SVT (heart rate originally over 200) and was found to have bilateral pulmonary emboli on admission. Patient continues to have rhonchi and poor breathing effort but says she feels better and she is more alert today.   Assessment/Plan:   *Principal Problem:  Acute respiratory failure secondary to lymphangitic lung metastasis and Pulmonary embolism  Secondary to combination of PE, lymphangitic spread of breast cancer, malignant pleural effusion  Initially on heparin but then she has refused blood draws for heparin monitoring so was switched to SQ Lovenox 08/02/12. Lovenox prescriptions provided Echocardiogram done 08/01/12. EF 60-65%, no evidence of RV pressure overload. PEs are very small on imaging.    Active Problems:  Stage IV breast cancer  Followed by Dr. Darnelle Catalan who recommended hospice care  DNI only  Continue Arimidex. Chest  discomfort / elevated troponin / SVT / demand ischemia / NSTEMI  Likely due to the patient's pulmonary embolus and SVT resulting in demand ischemia. Status post adenosine and now rate controlled  EKG without any acute ST-T wave changes. No wall motion abnormalities on 2-D Echo.  Pulmonary infiltrates  Noted in the right upper lobe, infectious process versus lymphangitic tumor spread.  Followup final blood cultures negative to date  Started Levaquin and will continue for next 4 days Continue prednisone taper for next 5 days 50 mg taper by 10 mg a day to 0 mg.  CXR done 08/04/2012, stable. Syncope  Likely due to the patient's SVT--->back in sinus post adenosine.  Continue metoprolol 12.5 mg BID No further syncopal episodes Chronic pain  Continue intravenous hydromorphone for pain control. Increwased to 3 mg Q 3 hours PRN for better pain control; at the time of discharge dilaudid switched to PO regimen Continue fentanyl patch 50 mcg every 72 hours.  Anemia of chronic disease  Ferritin 453, low iron at 23, TIBC 126, saturation ration 18, UIBC 103. 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  Hemoglobin stable, no signs of active bleed Elevated BNP  Likely from pulmonary embolism. No evidence of CHF on ECHO. Either represents volume overload or PE.  IV fluids minimized and given a dose of Lasix 08/02/2012 & 08/03/12. Continue lasix 40 mg PO daily  BNP improving Hypokalemia  Repleted. Elevated LFTs  Likely from metastatic disease involving the liver. Hypothyroidism  Continue Synthroid. Anxiety/panic attack  Ativan PRN Diarrhea  C. difficile PCR negative.   Code Status: DNI, limited code.  Family Communication: no family at bedside  Disposition Plan: Home with Hospice. .Not a candidate for residential hospice due to limited code status. Complex family dynamics.  Medical Consultants:  Dr. Ruthann Cancer, Oncology Other Consultants:  None. Anti-infectives:  Vancomycin  07/31/2012---> 08/07/2012  Zosyn 07/31/2012---> 08/07/2012  Levaquin 08/07/2012 --> for next 4 days on discharge  Manson Passey, MD  [facility]  Pager [phone]      Discharge Exam: Filed Vitals:   08/08/12 0519  BP: 112/74  Pulse: 94  Temp: 98.4 F (36.9 C)  Resp:    Filed Vitals:   08/07/12 2147 08/08/12 0110 08/08/12 0519 08/08/12 0626  BP:   112/74   Pulse:   94   Temp:   98.4 F (36.9 C)   TempSrc:   Oral   Resp:      Height:      Weight:      SpO2: 92% 84% 100% 100%    General: Pt is alert, follows commands appropriately, not in acute distress; very cachectic Cardiovascular: Regular rate and rhythm, S1/S2 +, no murmurs, no rubs, no gallops Respiratory: Clear to auscultation bilaterally, no wheezing, no crackles, no rhonchi Abdominal: Soft, non tender, non distended, bowel sounds +, no guarding Extremities: no edema, no cyanosis, pulses palpable bilaterally DP and PT Neuro: Grossly nonfocal  Discharge Instructions  Discharge Orders    Future Appointments: Provider: Department: Dept Phone: Center:   08/28/2012 1:45 PM [facility] [phone] None   08/28/2012 2:15 PM Amy Allegra Grana, PA [facility] [phone] None   09/26/2012 11:00 AM [facility] [phone] None     Future Orders Please Complete By Expires   Diet - low sodium heart healthy      Increase activity slowly      Call MD for:  persistant nausea and vomiting      Call MD for:  severe uncontrolled pain      Call MD for:  difficulty breathing, headache or visual disturbances      Call MD for:  persistant dizziness or light-headedness          Medication List     As of 08/08/2012 10:38 AM    STOP taking these medications         azithromycin 250 MG tablet   Commonly known as: ZITHROMAX      TAKE these medications         albuterol (5 MG/ML) 0.5% nebulizer solution    Commonly known as: PROVENTIL   Take 0.5 mLs (2.5 mg total) by nebulization every 6 (six) hours as needed for wheezing or shortness of breath.      anastrozole 1 MG tablet   Commonly known as: ARIMIDEX   TAKE 1 TABLET BY MOUTH DAILY      cholestyramine 4 G packet   Commonly known as: QUESTRAN   1 packet by mouth with food twice daily as needed for diarrhea      docusate sodium 100 MG capsule   Commonly known as: COLACE   Take 100 mg by mouth daily.      enoxaparin 60 MG/0.6ML injection   Commonly known as: LOVENOX   Inject 0.6 mLs (60 mg total) into the skin 2 (two) times daily.      fentaNYL 50 MCG/HR   Commonly known as: DURAGESIC - dosed mcg/hr   Place 1 patch (50 mcg total) onto the skin every 3 (three) days.      furosemide 40 MG tablet   Commonly known as: LASIX   Take 1 tablet (40 mg total) by  mouth daily.      guaiFENesin-dextromethorphan 100-10 MG/5ML syrup   Commonly known as: ROBITUSSIN DM   Take 5 mLs by mouth every 4 (four) hours as needed for cough.      HYDROmorphone 4 MG tablet   Commonly known as: DILAUDID   Take 1 tablet (4 mg total) by mouth every 4 (four) hours as needed for pain. For pain      levofloxacin 750 MG tablet   Commonly known as: LEVAQUIN   Take 1 tablet (750 mg total) by mouth daily.      levothyroxine 150 MCG tablet   Commonly known as: SYNTHROID, LEVOTHROID   Take 1 tablet (150 mcg total) by mouth daily.      LORazepam 1 MG tablet   Commonly known as: ATIVAN   Take 1 tablet (1 mg total) by mouth daily. For anxiety      megestrol 400 MG/10ML suspension   Commonly known as: MEGACE   Take 200 mg by mouth daily.      metoCLOPramide 10 MG tablet   Commonly known as: REGLAN   Take 10 mg by mouth 4 (four) times daily.      ondansetron 4 MG disintegrating tablet   Commonly known as: ZOFRAN-ODT   Take 8 mg by mouth every 8 (eight) hours as needed. For nausea      predniSONE 50 MG tablet   Commonly known as: DELTASONE   Please taper  by 10 mg a day to 0 mg starting from 50 mg      PRESCRIPTION MEDICATION   Inject into the vein every 28 (twenty-eight) days. Zometa once monthly.      promethazine 25 MG tablet   Commonly known as: PHENERGAN   Take 1 tablet (25 mg total) by mouth every 6 (six) hours as needed. Nausea           Follow-up Information    Follow up with [name], MD. (As needed)    Contact information:   [address] [place] [address] [phone]           The results of significant diagnostics from this hospitalization (including imaging, microbiology, ancillary and laboratory) are listed below for reference.    Significant Diagnostic Studies: Ct Angio Chest W/cm &/or Wo Cm  07/31/2012  *RADIOLOGY REPORT*  Clinical Data: Breathing discomfort, increasing shortness of breath, metastatic breast cancer, hypertension  CT ANGIOGRAPHY CHEST  Technique:  Multidetector CT imaging of the chest using the standard protocol during bolus administration of intravenous contrast. Multiplanar reconstructed images including MIPs were obtained and reviewed to evaluate the vascular anatomy.  Contrast: OMNIPAQUE IOHEXOL 350 MG/ML SOLN  Comparison: 05/24/2012  Findings: Aorta normal caliber without aneurysm or dissection. Metastatic lesion at medial aspect right lobe liver image 96 4.0 x 3.3 cm, previously 3.1 x 2.0 cm and lateral segment left lobe 7.0 x 4.5 cm previously 6.1 x 2.8 cm. Significant intrahepatic biliary dilatation. Question abnormal soft tissue and gastrohepatic ligament suspicious for tumor. New significant intrahepatic biliary dilatation. Thickening of the left adrenal gland and perihepatic ascites noted. Soft tissue nodule posterior to the upper pole of the left kidney 7 mm diameter unchanged.  Abnormal soft tissues identified throughout the anterior mediastinum, both hila, and subcarinal region compatible with tumor increased since previous exam. Multiple right retro pectoral nodes,  slightly increased. Question adenopathy at the bases of the cervical regions bilaterally. Narrowing of the right upper lobe bronchi at the origins. Moderate sized bilateral pleural effusions  greater on left.  Multiple pulmonary emboli identified within right lower lobe pulmonary arteries. Questionable small pulmonary embolus in the left lower lobe. Compression of the right upper right lower lobe pulmonary arteries by soft tissue at the left hilum. Extensive infiltrate identified in the right upper lobe, potentially lymphangitic tumor. Increased volume loss in the upper lobes. Additional areas of potential tumor are identified in the the right middle lobe and left upper lobe. Minimal compressive atelectasis in bilateral lower lobes. Numerous osseous metastases involving thoracic vertebrae and right scapula, including a destructive lesion involving right glenoid with associated fragmentation.  IMPRESSION: Pulmonary emboli identified within right lower lobe pulmonary arteries and likely a small embolus in a left lower lobe pulmonary artery. Increased abnormal mediastinal and bilateral hilar soft tissue since previous exam compatible with tumor progression. Increased size of hepatic metastases and pulmonary tumor spread. Increased bilateral pleural effusions. Osseous metastatic disease and right retro pectoral adenopathy.  Findings called to Dr. Manus Gunning  on 07/31/2029 at 1718 hours.   Original Report Authenticated By: Ulyses Southward, M.D.    Dg Chest Port 1 View  08/04/2012  *RADIOLOGY REPORT*  Clinical Data: Possible health care associated pneumonia, pulmonary emboli, elevated proBNP  PORTABLE CHEST - 1 VIEW  Comparison: CTA chest dated 07/31/2012  Findings: Left apical pleural thickening/opacity.  Right upper lobe/paramediastinal opacity/mass.  Left basilar atelectasis with pleural effusion.  Known right pleural effusion is not radiographically evident.  No pneumothorax.  The appearance unchanged from prior CT.  The  heart is normal in size.  Sclerotic metastasis in the right scapula/glenoid.  IMPRESSION: Stable right upper lobe/paramediastinal opacity/mass.  Stable left apical pleural thickening/opacity.  Stable left basilar atelectasis with pleural effusion.   Original Report Authenticated By: Charline Bills, M.D.    Dg Chest Port 1 View  07/31/2012  *RADIOLOGY REPORT*  Clinical Data: Breast cancer and short of breath  PORTABLE CHEST - 1 VIEW  Comparison: CT chest 05/24/2012, chest x-ray 05/23/2012  Findings: Progression of prominent interstitial markings bilaterally especially in the right upper lobe and  right lower lobe.  Right perihilar mass lesion appears larger.  This may be due to lymphangitic spread of breast cancer.  Lung cancer not excluded.  Extensive pleural parenchymal scarring left upper lobe is unchanged.     Heart size is normal. Increase in left effusion and left lower lobe atelectasis.  IMPRESSION: Progression of prominent interstitial markings on the right with perihilar mass lesion.  This may be due to lymphangitic spread of tumor.  Increase in left lower lobe atelectasis and left effusion.  Extensive scarring in the left apex is stable.   Original Report Authenticated By: Janeece Riggers, M.D.     Microbiology: Recent Results (from the past 240 hour(s))  CULTURE, BLOOD (ROUTINE X 2)     Status: Normal   Collection Time   07/31/12  2:26 PM      Component Value Range Status Comment   Specimen Description BLOOD LEFT ARM   Final    Special Requests BOTTLES DRAWN AEROBIC AND ANAEROBIC 5CC   Final    Culture  Setup Time 07/31/2012 21:42   Final    Culture NO GROWTH 5 DAYS   Final    Report Status 08/06/2012 FINAL   Final   CULTURE, BLOOD (ROUTINE X 2)     Status: Normal   Collection Time   07/31/12  3:03 PM      Component Value Range Status Comment   Specimen Description BLOOD RIGHT HAND  Final    Special Requests BOTTLES DRAWN AEROBIC ONLY 5CC   Final    Culture  Setup Time 07/31/2012  21:42   Final    Culture NO GROWTH 5 DAYS   Final    Report Status 08/06/2012 FINAL   Final   URINE CULTURE     Status: Normal   Collection Time   07/31/12  5:56 PM      Component Value Range Status Comment   Specimen Description URINE, RANDOM   Final    Special Requests NONE   Final    Culture  Setup Time 08/01/2012 01:59   Final    Colony Count 80,000 COLONIES/ML   Final    Culture     Final    Value: Multiple bacterial morphotypes present, none predominant. Suggest appropriate recollection if clinically indicated.   Report Status 08/01/2012 FINAL   Final   MRSA PCR SCREENING     Status: Normal   Collection Time   07/31/12  8:33 PM      Component Value Range Status Comment   MRSA by PCR NEGATIVE  NEGATIVE Final   CLOSTRIDIUM DIFFICILE BY PCR     Status: Normal   Collection Time   08/03/12  8:02 AM      Component Value Range Status Comment   C difficile by pcr NEGATIVE  NEGATIVE Final      Labs: Basic Metabolic Panel:  Lab 08/06/12 1610 08/05/12 0351 08/03/12 0525 08/02/12 0400  NA 135 137 137 140  K 4.2 4.4 3.7 3.1*  CL 100 102 106 110  CO2 26 28 24 23   GLUCOSE 153* 178* 106* 141*  BUN 8 6 7 7   CREATININE 0.77 0.75 0.81 0.65  CALCIUM 8.3* 8.5 8.1* 8.2*  MG -- -- -- --  PHOS -- -- -- --   Liver Function Tests: No results found for this basename: AST:5,ALT:5,ALKPHOS:5,BILITOT:5,PROT:5,ALBUMIN:5 in the last 168 hours No results found for this basename: LIPASE:5,AMYLASE:5 in the last 168 hours No results found for this basename: AMMONIA:5 in the last 168 hours CBC:  Lab 08/08/12 0508 08/07/12 0435 08/06/12 0333 08/05/12 0351 08/04/12 0340  WBC 6.6 8.4 8.4 6.1 5.7  NEUTROABS -- -- -- -- --  HGB 8.6* 8.8* 8.5* 8.8* 7.7*  HCT 27.2* 28.3* 26.5* 27.7* 23.8*  MCV 79.1 79.3 78.6 78.5 77.8*  PLT 387 406* 396 384 299   Cardiac Enzymes: No results found for this basename: CKTOTAL:5,CKMB:5,CKMBINDEX:5,TROPONINI:5 in the last 168 hours BNP: BNP (last 3 results)  Basename  08/04/12 0340 07/31/12 1426  PROBNP 316.8* 1950.0*   CBG: No results found for this basename: GLUCAP:5 in the last 168 hours  Time coordinating discharge: Over 30 minutes  Signed:  Manson Passey, MD  [facility] 08/08/2012, 10:38 AM  Pager #: [phone]

## 2012-08-08 NOTE — Progress Notes (Signed)
Notified patient's family that she is to be discharged this afternoon. Patient's mother coming to pick her up around 2 pm this afternoon. Erskin Burnet RN

## 2012-08-08 NOTE — Progress Notes (Signed)
Patient's oxygen saturation was 85% on room air at rest in her bed.  Erskin Burnet RN

## 2012-08-08 NOTE — Progress Notes (Signed)
Pt very drowsy.  States that she does not know what the plan is for her and is not sure if she is going to be discharged home or not.  Will follow up with Hospice hospital liaison, [name] to follow up on plan.  Wynonia Hazard, LCSW

## 2012-08-08 NOTE — Progress Notes (Signed)
ANTICOAGULATION CONSULT NOTE   Pharmacy Consult for Lovenox Indication: pulmonary embolus  No Known Allergies  Patient Measurements: Height: 5' 9.5" (176.5 cm) Weight: 141 lb 8.6 oz (64.2 kg) IBW/kg (Calculated) : 67.35   Vital Signs: Temp: 98.4 F (36.9 C) (12/31 0519) Temp src: Oral (12/31 0519) BP: 112/74 mmHg (12/31 0519) Pulse Rate: 94  (12/31 0519)  Labs:  Basename 08/08/12 0508 08/07/12 0435 08/06/12 0333  HGB 8.6* 8.8* --  HCT 27.2* 28.3* 26.5*  PLT 387 406* 396  APTT -- -- --  LABPROT -- -- --  INR -- -- --  HEPARINUNFRC -- -- --  CREATININE -- -- 0.77  CKTOTAL -- -- --  CKMB -- -- --  TROPONINI -- -- --    Estimated Creatinine Clearance: 88.1 ml/min (by C-G formula based on Cr of 0.77).   Medical History: Past Medical History  Diagnosis Date  . Breast lump   . Thyroid disease   . Hypertension   . Hx of radiation therapy 06/2010, 10/2009, 11/2011    L breast, T3-T12 spine, L-S spine  . 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 Breast cancer 09/2009    L breast, ER+, PR-, Her 2 -  . History of radiation therapy 10/27/11-11/15/11    lumbar/sacral spine/18MV photons    Medications:  Scheduled:     . albuterol  2.5 mg Nebulization Q6H  . anastrozole  1 mg Oral Daily  . antiseptic oral rinse  15 mL Mouth Rinse BID  . enoxaparin (LOVENOX) injection  1 mg/kg Subcutaneous BID  . fentaNYL  50 mcg Transdermal Q72H  . furosemide  40 mg Oral Daily  . levofloxacin  750 mg Oral Daily  . levothyroxine  150 mcg Oral QAC breakfast  . megestrol  200 mg Oral Daily  . metoprolol tartrate  12.5 mg Oral BID  . potassium chloride  20 mEq Oral BID  . predniSONE  50 mg Oral Q breakfast  . scopolamine  1 patch Transdermal Q72H  . sodium chloride  3 mL Intravenous Q12H  . [DISCONTINUED] enoxaparin (LOVENOX) injection  1 mg/kg Subcutaneous BID  . [DISCONTINUED] methylPREDNISolone (SOLU-MEDROL) injection  40 mg Intravenous Q12H  . [DISCONTINUED] piperacillin-tazobactam   3.375 g Intravenous Q8H  . [DISCONTINUED] vancomycin  500 mg Intravenous Q8H   Infusions:     . 0.9 % NaCl with KCl 40 mEq / L 10 mL/hr at 08/07/12 1610    Assessment: 34 YOF with metastatic breast cancer, presents to ED on 12/23 with shortness of breath; CT showed bilateral PE. IV heparin initially started, then transitioned to LMWH 12/25 due to patient refusal of required heparin monitoring labs. Plt wnl, Hgb low but stable, CrCl ~ 88 ml/min.  Awaiting decision re: long-term anticoag plans. Per most recent CHEST guidelines, Lovenox is preferred over warfarin in cancer patients. Since patient has refused lab work for heparin in hospital, concerned about noncompliance as outpatient if warfarin were to be used (INR monitoring). Wouls suggest using Lovenox long-term. Dose could be switched to once daily for ease of administration as outpatient. Will change to once-daily Lovenox dosing for ease of administration as outpatient (starting tomorrow morning)   Goal of Therapy:  Anti-Xa level 1-2 units/ml 4 hours after LWMH dose (1.5mg /kg dose) Monitor platelets by anticoagulation protocol: Yes   Plan:   Continue Lovenox 65mg  SQ q12h (1mg /kg q12h) for today's doses  Tomorrow morning, start Lovenox 1.5mg /kg SQ q24h (100mg  SQ q24h)  Follow up CBC and renal function.  Follow up long term anticoagulation plans  Darrol Angel, PharmD Pager: [phone] 08/08/2012 9:56 AM

## 2012-08-08 NOTE — Progress Notes (Signed)
Patient discharged home in stable condition. Patient wanted IV pain medication before she left. Educated patient on safety regarding IV pain medications. Obtained 1 time order of dilaudid by mouth for patient to safely control pain as patient discharges.  Patient's mother verbalized understanding. Patient discharged into her mother's care. Erskin Burnet RN

## 2012-08-10 ENCOUNTER — Other Ambulatory Visit: Payer: Self-pay | Admitting: Oncology

## 2012-08-16 ENCOUNTER — Other Ambulatory Visit: Payer: Self-pay | Admitting: *Deleted

## 2012-08-16 ENCOUNTER — Encounter: Payer: Self-pay | Admitting: Pharmacist

## 2012-08-16 ENCOUNTER — Encounter: Payer: Self-pay | Admitting: *Deleted

## 2012-08-16 DIAGNOSIS — C50919 Malignant neoplasm of unspecified site of unspecified female breast: Secondary | ICD-10-CM

## 2012-08-16 DIAGNOSIS — I2699 Other pulmonary embolism without acute cor pulmonale: Secondary | ICD-10-CM

## 2012-08-16 NOTE — Progress Notes (Signed)
Pt referred to the Coumadin Clinic with hx of bilateral PE, previously managed on Lovenox 60mg  SQ BID.  Per Hospice request, pt to be started on Coumadin and taken off of Lovenox. She is to start Coumadin 2.5mg  this evening, and take her last dose of Lovenox tomorrow morning. Patient does have a hx of extensively metastatized breast cancer with multiple mets to her liver.  Her response to Coumadin is uncertain, so Dr. Darnelle Catalan is starting her at a lower dose.  Her INRs are to be drawn through [facility].  I have called them (phone # [phone]) to set up her first PT/INR check for Monday, 08/21/12.  The orders include instructions to fax these results to the [facility] at [phone].  We will follow up with them with dose modifications and instructions on when to draw her next PT/INR.

## 2012-08-16 NOTE — Progress Notes (Signed)
Received call from [facility] hospice nurse stating Dr.Feldman would like patient to convert to Coumadin.  Per Dr. Darnelle Catalan this ok.  Patient will finish her lovenox in the am and will start Coumadin 2.5mg  this pm. Hospice will do a PT/INR and call our Coumadin Clinic with results and make any changes needed.  Coumadin clinic referral made and faxed orders to hospice at [phone].

## 2012-08-17 ENCOUNTER — Telehealth: Payer: Self-pay | Admitting: Pharmacist

## 2012-08-17 NOTE — Telephone Encounter (Signed)
Kristin Lick, RN with [facility] called Coumadin Clinic to update [place] on the patient's compliance with Coumadin.   Pt and her mother did not pick up her Coumadin yesterday as planned, and instead plan to pick up Coumadin today.  (They went to have nails done instead?)   Verified with Mordecai Maes that we will have her first INR drawn on Monday, 08/21/12.  All Coumadin dose changes will be ordered by Coumadin Clinic on behalf of Dr. Darnelle Catalan.  INR checks have been ordered weekly, but the Coumadin Clinic will reserve the right to increase or decrease that frequency based on the patient's clinical need.

## 2012-08-21 ENCOUNTER — Telehealth: Payer: Self-pay | Admitting: Pharmacist

## 2012-08-21 ENCOUNTER — Other Ambulatory Visit: Payer: Self-pay | Admitting: *Deleted

## 2012-08-21 MED ORDER — FENTANYL 50 MCG/HR TD PT72: 1.0000 | MEDICATED_PATCH | TRANSDERMAL | Status: DC

## 2012-08-21 MED ORDER — ALBUTEROL SULFATE (5 MG/ML) 0.5% IN NEBU: 2.5000 mg | INHALATION_SOLUTION | Freq: Four times a day (QID) | RESPIRATORY_TRACT | Status: DC | PRN

## 2012-08-21 NOTE — Telephone Encounter (Signed)
Spoke with Melvenia Beam, RN with Hospice. INR results today = 1.6. Pt has been taking 2.5mg  daily since 08/17/12. Pt is not having any problems related to anticoagulation. Plan:  Will continue Coumadin 2.5mg  daily Recheck INR in 4 days on 08/25/12 with Hospice.  Above instructions were given to [facility]; Phone [phone] Melvenia Beam is covering for [place]. Mordecai Maes is usually the RN for this patient.

## 2012-08-25 ENCOUNTER — Other Ambulatory Visit: Payer: Self-pay | Admitting: *Deleted

## 2012-08-25 ENCOUNTER — Ambulatory Visit: Payer: Self-pay | Admitting: Pharmacist

## 2012-08-25 DIAGNOSIS — I2699 Other pulmonary embolism without acute cor pulmonale: Secondary | ICD-10-CM

## 2012-08-25 DIAGNOSIS — C50919 Malignant neoplasm of unspecified site of unspecified female breast: Secondary | ICD-10-CM

## 2012-08-25 LAB — POCT INR: INR: 1.9

## 2012-08-25 MED ORDER — LORAZEPAM 1 MG PO TABS: ORAL_TABLET | ORAL | Status: DC

## 2012-08-25 NOTE — Progress Notes (Signed)
INR = 2.4 I spoke w/ Hospice RN, Mordecai Maes over the phone.  She states she confirmed w/ pts mother that she is giving her dtr 1/2 tab = 2.5 mg every night.  Mordecai Maes states it has been an ongoing "difficulty with meds"; it is "touch & go" with pts mother.  Therese loads up the med box but the pts mother is the person dispensing to pt. Pts breathing is a lot better than it was a week ago.   INR therapeutic.  Cont same dose. Repeat INR 08/31/12 since that is when Mordecai Maes goes back to see pt.  Pt does have appt here on 08/28/12 but she does not need INR then. Marily Lente, Pharm.D.

## 2012-08-25 NOTE — Telephone Encounter (Signed)
Kristin [name] with Hospice called [phone] and requested that we increase her ativan to at least twice a day.  Discussed with Dr. Darnelle Catalan and v/o given.  Medication called in to [facility].  Hospice RN - Diane notified of change.

## 2012-08-28 ENCOUNTER — Telehealth: Payer: Self-pay | Admitting: Oncology

## 2012-08-28 ENCOUNTER — Ambulatory Visit (HOSPITAL_BASED_OUTPATIENT_CLINIC_OR_DEPARTMENT_OTHER): Payer: Medicare Other | Admitting: Physician Assistant

## 2012-08-28 ENCOUNTER — Other Ambulatory Visit (HOSPITAL_BASED_OUTPATIENT_CLINIC_OR_DEPARTMENT_OTHER): Payer: Medicare Other | Admitting: Lab

## 2012-08-28 ENCOUNTER — Encounter: Payer: Self-pay | Admitting: Physician Assistant

## 2012-08-28 VITALS — BP 107/75 | HR 116 | Temp 97.9°F | Resp 20 | Ht 69.5 in | Wt 124.8 lb

## 2012-08-28 DIAGNOSIS — R634 Abnormal weight loss: Secondary | ICD-10-CM

## 2012-08-28 DIAGNOSIS — I2699 Other pulmonary embolism without acute cor pulmonale: Secondary | ICD-10-CM

## 2012-08-28 DIAGNOSIS — C7952 Secondary malignant neoplasm of bone marrow: Secondary | ICD-10-CM

## 2012-08-28 DIAGNOSIS — C50919 Malignant neoplasm of unspecified site of unspecified female breast: Secondary | ICD-10-CM

## 2012-08-28 DIAGNOSIS — Z86711 Personal history of pulmonary embolism: Secondary | ICD-10-CM

## 2012-08-28 DIAGNOSIS — Z7901 Long term (current) use of anticoagulants: Secondary | ICD-10-CM

## 2012-08-28 LAB — COMPREHENSIVE METABOLIC PANEL (CC13)
ALT: 48 U/L (ref 0–55)
AST: 61 U/L — ABNORMAL HIGH (ref 5–34)
Albumin: 2.5 g/dL — ABNORMAL LOW (ref 3.5–5.0)
Calcium: 9.7 mg/dL (ref 8.4–10.4)
Chloride: 93 mEq/L — ABNORMAL LOW (ref 98–107)
Potassium: 3.5 mEq/L (ref 3.5–5.1)

## 2012-08-28 LAB — CBC WITH DIFFERENTIAL/PLATELET
BASO%: 0.6 % (ref 0.0–2.0)
Basophils Absolute: 0 10*3/uL (ref 0.0–0.1)
EOS%: 0.5 % (ref 0.0–7.0)
HGB: 11.4 g/dL — ABNORMAL LOW (ref 11.6–15.9)
MCH: 27.3 pg (ref 25.1–34.0)
MCHC: 33.7 g/dL (ref 31.5–36.0)
RDW: 23.6 % — ABNORMAL HIGH (ref 11.2–14.5)
WBC: 6.1 10*3/uL (ref 3.9–10.3)
lymph#: 0.3 10*3/uL — ABNORMAL LOW (ref 0.9–3.3)

## 2012-08-28 LAB — PROTIME-INR: INR: 3 (ref 2.00–3.50)

## 2012-08-28 MED ORDER — PREDNISONE 5 MG PO TABS: 5.0000 mg | ORAL_TABLET | Freq: Every day | ORAL | Status: DC

## 2012-08-28 NOTE — Progress Notes (Signed)
ID: Kristin [name]   DOB: 15-Apr-1966  MR#: [id number]  BMW#:[id number]  HISTORY OF PRESENT ILLNESS: Kristin [name] was originally seen in the hospital for an initial consult on September 17, 2009. At that time, she presented with back pain and was found to have a markedly abnormal left breast and axilla as well as lytic bone lesions. We obtained a biopsy of the left breast on February 8th which showed a high-grade tumor which was ER 100% receptor positive but Her-2 and progesterone receptor negative. The proliferation fraction was elevated at 33%. We then obtained a bone biopsy on February 11th and her T12 vertebral body indeed was found to harbor an estrogen receptor positive, progesterone receptor negative carcinoma consistent with her breast primary.  She had complete staging studies including a brain MRI with and without contrast which showed no evidence of metastatic disease to the brain, and a CT of the abdomen and pelvis which showed no liver lesions and no other lesions other than in the bones. There was a fibroid uterus incidentally noted. She had a CT of the chest previously (February 7th) which showed only some chronic appearing changes in the left upper lobe, consistent with her history of treated tuberculosis. Again, there were multiple lytic bone lesions.  Kristin [name] was treated in the neoadjuvant setting of docetaxel cyclophosphamide and doxorubicin x6, completed June 2011. She also received radiation therapy to T3 through T12.  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 Grade 2, negative sentinel lymph node. Patient was started on tamoxifen in July 2011, in addition to zoledronic acid which is been given monthly. Her subsequent treatment is as detailed below.  INTERVAL HISTORY: Kristin [name] returns today accompanied by her mother for followup of her metastatic breast carcinoma. She is  currently under hospice care at home, and her nurse visits just about every other day. Kristin [name] finds this very helpful as does her mother. They're both living at [facility].  Interval history is remarkable for a hospitalization in late December for pulmonary embolus. Kristin [name] is now on therapeutic Coumadin, and is followed through our Coumadin clinic. She's had no signs of abnormal bleeding. She feels that she is breathing very well, and denies any cough, phlegm production, shortness of breath, or chest pain.  Kristin [name]'s pain is also very well-controlled currently, on Duragesic at 50 mcg per hour. Her biggest complaint right now is decreased appetite and in fact she has lost approximately 13 pounds in the last 2 months.  REVIEW OF SYSTEMS: Kristin [name] has no additional complaints today and tells me she is feeling better since her hospitalization. She is very pleased with her pain control at this point. She still has occasional pain in the left leg. She continues on anastrozole which she is tolerating well. She's had no fevers or chills. She has occasional nausea and in fact had a couple of episodes of emesis yesterday which she tells me is unusual. She's having good bowel movements every day. She's had no abnormal headaches or dizziness. She is walking short distances on her own at home.  A detailed review of systems is otherwise stable today.   PAST MEDICAL HISTORY: Past Medical History  Diagnosis Date  . Breast lump   . Thyroid disease   . Hypertension   . Hx of radiation therapy 06/2010, 10/2009, 11/2011    L breast, T3-T12 spine, L-S spine  . met breast ca  to bone dx'd 09/2009  . Breast cancer 09/2009    L breast, ER+, PR-, Her 2 -  . History of radiation therapy 10/27/11-11/15/11    lumbar/sacral spine/18MV photons    PAST SURGICAL HISTORY: Past Surgical History  Procedure Date  . Knee arthroscopy     left  . Breast surgery     left lumpectomy  . Knee cartilage surgery 2006    FAMILY  HISTORY Patient's mother is alive and in her mid 79s. She has a history of treated tuberculosis. The patient's father died from a drug overdose, and the patient does not know much about him. She has one brother with hypertension and one sister, Kristin [name]. There is no known history of breast or ovarian cancer in the family.   GYNECOLOGIC HISTORY: Gx P1, first pregnancy to term at age 43. Her son was premature.  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 She is currently unemployed. She has been living with her mother, Kristin [name], but now has her own place. The patient's son. Kristin [name], has graduated from high school and has applied for a job core position. He is still at home with the patient at least part-time. The patient is a Control and instrumentation engineer.    ADVANCED DIRECTIVES: DNR in place  HEALTH MAINTENANCE: History  Substance Use Topics  . Smoking status: Former Smoker    Quit date: 08/25/2007  . Smokeless tobacco: Never Used  . Alcohol Use: No     Colonoscopy:  PAP:  Bone density:  Lipid panel:  No Known Allergies  Current Outpatient Prescriptions  Medication Sig Dispense Refill  . albuterol (PROVENTIL) (5 MG/ML) 0.5% nebulizer solution Take 0.5 mLs (2.5 mg total) by nebulization every 6 (six) hours as needed for wheezing or shortness of breath.  20 mL  1  . anastrozole (ARIMIDEX) 1 MG tablet TAKE 1 TABLET BY MOUTH DAILY  30 tablet  2  . cholestyramine (QUESTRAN) 4 G packet 1 packet by mouth with food twice daily as needed for diarrhea  60 each  12  . docusate sodium (COLACE) 100 MG capsule Take 100 mg by mouth daily.      . fentaNYL (DURAGESIC - DOSED MCG/HR) 50 MCG/HR Place 1 patch (50 mcg total) onto the skin every 3 (three) days.  5 patch  0  . furosemide (LASIX) 40 MG tablet Take 1 tablet (40 mg total) by mouth daily.  30 tablet  0  . guaiFENesin-dextromethorphan (ROBITUSSIN DM) 100-10 MG/5ML syrup Take 5 mLs by mouth every 4 (four) hours  as needed for cough.  118 mL  0  . HYDROmorphone (DILAUDID) 4 MG tablet Take 1 tablet (4 mg total) by mouth every 4 (four) hours as needed for pain. For pain  60 tablet  0  . levothyroxine (SYNTHROID, LEVOTHROID) 150 MCG tablet TAKE ONE TABLET BY MOUTH DAILY  30 tablet  0  . LORazepam (ATIVAN) 1 MG tablet Take one tablet BID  prn anxiety  30 tablet  0  . metoCLOPramide (REGLAN) 10 MG tablet TAKE 1 TABLET BY MOUTH FOUR TIMES DAILY  60 tablet  1  . ondansetron (ZOFRAN-ODT) 4 MG disintegrating tablet Take 8 mg by mouth every 8 (eight) hours as needed. For nausea      . PRESCRIPTION MEDICATION Inject 4 mg into the vein. Zometa every 2 months      . promethazine (PHENERGAN) 25 MG tablet Take 1 tablet (25 mg total) by mouth every 6 (six) hours as  needed. Nausea  30 tablet  1  . enoxaparin (LOVENOX) 60 MG/0.6ML injection Inject 0.6 mLs (60 mg total) into the skin 2 (two) times daily.  30 Syringe  3  . levofloxacin (LEVAQUIN) 750 MG tablet Take 1 tablet (750 mg total) by mouth daily.  10 tablet  0  . predniSONE (DELTASONE) 5 MG tablet Take 1 tablet (5 mg total) by mouth daily.  30 tablet  1    OBJECTIVE: Middle-aged [place] American woman examined in a wheelchair Filed Vitals:   08/28/12 1406  BP: 107/75  Pulse: 116  Temp: 97.9 F (36.6 C)  Resp: 20     Body mass index is 18.17 kg/(m^2).    ECOG FS: 2 Filed Weights   08/28/12 1406  Weight: 124 lb 12.8 oz (56.609 kg)   Physical Exam: HEENT:  Sclerae anicteric.  Oropharynx dry but clear. Breast Exam:  Deferred Lungs:  Clear to auscultation bilaterally.  No crackles, rhonchi, or wheezes.  Heart:  Regular rate and rhythm.   Abdomen:  Soft, nontender.  Positive bowel sounds.   Extremities:  No peripheral edema.  Neuro:  Nonfocal, well oriented, positive affect  LAB RESULTS: Lab Results  Component Value Date   WBC 6.1 08/28/2012   NEUTROABS 5.0 08/28/2012   HGB 11.4* 08/28/2012   HCT 33.9* 08/28/2012   MCV 81.0 08/28/2012   PLT 364 08/28/2012        Chemistry      Component Value Date/Time   NA 135 08/06/2012 0333   NA 139 07/25/2012 1124   K 4.2 08/06/2012 0333   K 3.7 07/25/2012 1124   CL 100 08/06/2012 0333   CL 105 07/25/2012 1124   CO2 26 08/06/2012 0333   CO2 28 07/25/2012 1124   BUN 8 08/06/2012 0333   BUN 9.0 07/25/2012 1124   CREATININE 0.77 08/06/2012 0333   CREATININE 0.7 07/25/2012 1124      Component Value Date/Time   CALCIUM 8.3* 08/06/2012 0333   CALCIUM 9.1 07/25/2012 1124   ALKPHOS 832* 07/25/2012 1124   ALKPHOS 83 05/23/2012 2045   AST 46* 07/25/2012 1124   AST 14 05/23/2012 2045   ALT 31 07/25/2012 1124   ALT 6 05/23/2012 2045   BILITOT 1.32* 07/25/2012 1124   BILITOT 0.3 05/23/2012 2045       Lab Results  Component Value Date   LABCA2 666* 07/25/2012     STUDIES:  Ct Angio Chest W/cm &/or Wo Cm  07/31/2012  *RADIOLOGY REPORT*  Clinical Data: Breathing discomfort, increasing shortness of breath, metastatic breast cancer, hypertension  CT ANGIOGRAPHY CHEST  Technique:  Multidetector CT imaging of the chest using the standard protocol during bolus administration of intravenous contrast. Multiplanar reconstructed images including MIPs were obtained and reviewed to evaluate the vascular anatomy.  Contrast: OMNIPAQUE IOHEXOL 350 MG/ML SOLN  Comparison: 05/24/2012  Findings: Aorta normal caliber without aneurysm or dissection. Metastatic lesion at medial aspect right lobe liver image 96 4.0 x 3.3 cm, previously 3.1 x 2.0 cm and lateral segment left lobe 7.0 x 4.5 cm previously 6.1 x 2.8 cm. Significant intrahepatic biliary dilatation. Question abnormal soft tissue and gastrohepatic ligament suspicious for tumor. New significant intrahepatic biliary dilatation. Thickening of the left adrenal gland and perihepatic ascites noted. Soft tissue nodule posterior to the upper pole of the left kidney 7 mm diameter unchanged.  Abnormal soft tissues identified throughout the anterior mediastinum, both  hila, and subcarinal region compatible with tumor increased since previous exam. Multiple right  retro pectoral nodes, slightly increased. Question adenopathy at the bases of the cervical regions bilaterally. Narrowing of the right upper lobe bronchi at the origins. Moderate sized bilateral pleural effusions greater on left.  Multiple pulmonary emboli identified within right lower lobe pulmonary arteries. Questionable small pulmonary embolus in the left lower lobe. Compression of the right upper right lower lobe pulmonary arteries by soft tissue at the left hilum. Extensive infiltrate identified in the right upper lobe, potentially lymphangitic tumor. Increased volume loss in the upper lobes. Additional areas of potential tumor are identified in the the right middle lobe and left upper lobe. Minimal compressive atelectasis in bilateral lower lobes. Numerous osseous metastases involving thoracic vertebrae and right scapula, including a destructive lesion involving right glenoid with associated fragmentation.  IMPRESSION: Pulmonary emboli identified within right lower lobe pulmonary arteries and likely a small embolus in a left lower lobe pulmonary artery. Increased abnormal mediastinal and bilateral hilar soft tissue since previous exam compatible with tumor progression. Increased size of hepatic metastases and pulmonary tumor spread. Increased bilateral pleural effusions. Osseous metastatic disease and right retro pectoral adenopathy.  Findings called to Dr. Manus Gunning  on 07/31/2029 at 1718 hours.   Original Report Authenticated By: Ulyses Southward, M.D.    Dg Chest Port 1 View  08/04/2012  *RADIOLOGY REPORT*  Clinical Data: Possible health care associated pneumonia, pulmonary emboli, elevated proBNP  PORTABLE CHEST - 1 VIEW  Comparison: CTA chest dated 07/31/2012  Findings: Left apical pleural thickening/opacity.  Right upper lobe/paramediastinal opacity/mass.  Left basilar atelectasis with pleural effusion.  Known  right pleural effusion is not radiographically evident.  No pneumothorax.  The appearance unchanged from prior CT.  The heart is normal in size.  Sclerotic metastasis in the right scapula/glenoid.  IMPRESSION: Stable right upper lobe/paramediastinal opacity/mass.  Stable left apical pleural thickening/opacity.  Stable left basilar atelectasis with pleural effusion.   Original Report Authenticated By: Charline Bills, M.D.    Dg Chest Port 1 View  07/31/2012  *RADIOLOGY REPORT*  Clinical Data: Breast cancer and short of breath  PORTABLE CHEST - 1 VIEW  Comparison: CT chest 05/24/2012, chest x-ray 05/23/2012  Findings: Progression of prominent interstitial markings bilaterally especially in the right upper lobe and  right lower lobe.  Right perihilar mass lesion appears larger.  This may be due to lymphangitic spread of breast cancer.  Lung cancer not excluded.  Extensive pleural parenchymal scarring left upper lobe is unchanged.     Heart size is normal. Increase in left effusion and left lower lobe atelectasis.  IMPRESSION: Progression of prominent interstitial markings on the right with perihilar mass lesion.  This may be due to lymphangitic spread of tumor.  Increase in left lower lobe atelectasis and left effusion.  Extensive scarring in the left apex is stable.   Original Report Authenticated By: Janeece Riggers, M.D.      ASSESSMENT: A 47 year old [place] woman with history of breast cancer stage IV at diagnosis February 2011, with metastases to bone and possibly developing spread to lymph nodes, lungs and liver  (1) status post 30 Gy to T-3 through T-18 October 2009  (2) status post docetaxel, cyclophosphamide and doxorubicin x6, completed in June 2011.  (3) status post Left lumpectomy and sentinel lymph node biopsy August 2011 for a 2-mm focus of residual invasive ductal carcinoma, grade 2, negative sentinel lymph node  (4) tamoxifen started July 2011, with evidence of progression March  2013  (5) status post radiation to Left breast completed November 2011  (  6) s/p radiation to the lower spine, completed 11/15/2011  (7) fulvestrant/goserelin started 11/03/2011, with evidence of progression in September 2013  (8)  status post palliative radiation to the right hip in September 2013  (9) Continuing goserelin and adding anastrazole, beginning 05/09/2012  (10)  continuing on zolendronic acid every other month, last given 06/27/12 and due again in January 2014  PLAN:  This case has been reviewed with Dr. Darnelle Catalan. Kristin [name] appears to be stable for the most part. The biggest problem right now is weight loss, and Dr. Darnelle Catalan has suggested adding Prednisone, 5 mg each morning, to Kristin [name]'s regimen.  Otherwise, we will make no additional changes. Her pain appears to be well-controlled. She will continue on anastrozole daily, Zoladex injections every 3 months (due in March) and zoledronic acid every 2 months. She is being scheduled for her next dose of zoledronic acid and next week, then again in 2 months when she returns to see Dr. Darnelle Catalan in March.  Of course she will continue to be followed by our Coumadin clinic.  All this was reviewed in detail with Kristin [name] and her mother who accompanies her today. She'll continue under hospice care, but they know to call with any changes or problems.   Joaquin Knebel    08/28/2012

## 2012-08-28 NOTE — Telephone Encounter (Signed)
gv pt sister appt schedule for January and March.

## 2012-08-29 ENCOUNTER — Other Ambulatory Visit: Payer: Self-pay | Admitting: Lab

## 2012-08-31 ENCOUNTER — Ambulatory Visit: Payer: Self-pay | Admitting: Pharmacist

## 2012-08-31 ENCOUNTER — Other Ambulatory Visit: Payer: Self-pay | Admitting: Oncology

## 2012-08-31 DIAGNOSIS — I2699 Other pulmonary embolism without acute cor pulmonale: Secondary | ICD-10-CM

## 2012-08-31 DIAGNOSIS — C50919 Malignant neoplasm of unspecified site of unspecified female breast: Secondary | ICD-10-CM

## 2012-08-31 NOTE — Progress Notes (Signed)
Spoke with [name] RN) today. Phone = [phone] INR slightly above goal today. Pt has had N/V for about 2 days per report from Hospice RN. It is likely a virus. Pt feels as though it may have been from soy milk. Pt didn't eat much for the past 2 days. She is feeling better today and her diet is returning to normal. Pt has not started the Prednisone 5mg  daily as prescribed by Zollie Scale on 08/28/12 per RN report. Will hold coumadin today.  Resume 2.5mg  daily on 09/01/12. Recheck INR in 1 week when here for Zometa infusion on 09/06/12; lab at 1:45pm, infusion at 2pm and coumadin clinic at 2:15pm.

## 2012-08-31 NOTE — Patient Instructions (Addendum)
Hold coumadin today.  Resume 2.5mg  daily on 09/01/12. Recheck INR in 1 week when here for Zometa infusion on 09/06/12; lab at 1:45pm, infusion at 2pm and coumadin clinic at 2:15pm.

## 2012-09-05 ENCOUNTER — Other Ambulatory Visit: Payer: Self-pay | Admitting: *Deleted

## 2012-09-05 MED ORDER — LORAZEPAM 1 MG PO TABS: ORAL_TABLET | ORAL | Status: DC

## 2012-09-06 ENCOUNTER — Other Ambulatory Visit: Payer: Self-pay | Admitting: Lab

## 2012-09-06 ENCOUNTER — Ambulatory Visit: Payer: Self-pay

## 2012-09-06 ENCOUNTER — Telehealth: Payer: Self-pay | Admitting: Pharmacist

## 2012-09-07 ENCOUNTER — Emergency Department (HOSPITAL_COMMUNITY)
Admission: EM | Admit: 2012-09-07 | Discharge: 2012-09-07 | Disposition: A | Discharge status: Home / Self Care | Payer: Medicare Other | Attending: Emergency Medicine | Admitting: Emergency Medicine

## 2012-09-07 ENCOUNTER — Encounter (HOSPITAL_COMMUNITY): Payer: Self-pay | Admitting: Emergency Medicine

## 2012-09-07 ENCOUNTER — Other Ambulatory Visit: Payer: Self-pay

## 2012-09-07 ENCOUNTER — Other Ambulatory Visit: Payer: Self-pay | Admitting: Lab

## 2012-09-07 ENCOUNTER — Emergency Department (HOSPITAL_COMMUNITY): Payer: Medicare Other

## 2012-09-07 ENCOUNTER — Telehealth: Payer: Self-pay | Admitting: Oncology

## 2012-09-07 DIAGNOSIS — M7989 Other specified soft tissue disorders: Secondary | ICD-10-CM | POA: Insufficient documentation

## 2012-09-07 DIAGNOSIS — R5381 Other malaise: Secondary | ICD-10-CM | POA: Insufficient documentation

## 2012-09-07 DIAGNOSIS — Z792 Long term (current) use of antibiotics: Secondary | ICD-10-CM | POA: Insufficient documentation

## 2012-09-07 DIAGNOSIS — IMO0002 Reserved for concepts with insufficient information to code with codable children: Secondary | ICD-10-CM | POA: Insufficient documentation

## 2012-09-07 DIAGNOSIS — Z923 Personal history of irradiation: Secondary | ICD-10-CM | POA: Insufficient documentation

## 2012-09-07 DIAGNOSIS — J90 Pleural effusion, not elsewhere classified: Secondary | ICD-10-CM | POA: Insufficient documentation

## 2012-09-07 DIAGNOSIS — I10 Essential (primary) hypertension: Secondary | ICD-10-CM | POA: Insufficient documentation

## 2012-09-07 DIAGNOSIS — R1084 Generalized abdominal pain: Secondary | ICD-10-CM | POA: Insufficient documentation

## 2012-09-07 DIAGNOSIS — R0602 Shortness of breath: Secondary | ICD-10-CM | POA: Insufficient documentation

## 2012-09-07 DIAGNOSIS — R531 Weakness: Secondary | ICD-10-CM

## 2012-09-07 DIAGNOSIS — C50919 Malignant neoplasm of unspecified site of unspecified female breast: Secondary | ICD-10-CM | POA: Insufficient documentation

## 2012-09-07 DIAGNOSIS — C7952 Secondary malignant neoplasm of bone marrow: Secondary | ICD-10-CM | POA: Insufficient documentation

## 2012-09-07 DIAGNOSIS — C7951 Secondary malignant neoplasm of bone: Secondary | ICD-10-CM | POA: Insufficient documentation

## 2012-09-07 DIAGNOSIS — Z79899 Other long term (current) drug therapy: Secondary | ICD-10-CM | POA: Insufficient documentation

## 2012-09-07 DIAGNOSIS — R64 Cachexia: Secondary | ICD-10-CM | POA: Insufficient documentation

## 2012-09-07 DIAGNOSIS — R5383 Other fatigue: Secondary | ICD-10-CM | POA: Insufficient documentation

## 2012-09-07 DIAGNOSIS — D649 Anemia, unspecified: Secondary | ICD-10-CM | POA: Insufficient documentation

## 2012-09-07 DIAGNOSIS — E079 Disorder of thyroid, unspecified: Secondary | ICD-10-CM | POA: Insufficient documentation

## 2012-09-07 LAB — COMPREHENSIVE METABOLIC PANEL
ALT: 13 U/L (ref 0–35)
AST: 19 U/L (ref 0–37)
Alkaline Phosphatase: 614 U/L — ABNORMAL HIGH (ref 39–117)
CO2: 30 mEq/L (ref 19–32)
GFR calc Af Amer: 65 mL/min — ABNORMAL LOW (ref >90)
GFR calc non Af Amer: 56 mL/min — ABNORMAL LOW (ref >90)
Glucose, Bld: 119 mg/dL — ABNORMAL HIGH (ref 70–99)
Potassium: 3.4 mEq/L — ABNORMAL LOW (ref 3.5–5.1)
Sodium: 133 mEq/L — ABNORMAL LOW (ref 135–145)
Total Protein: 6.6 g/dL (ref 6.0–8.3)

## 2012-09-07 LAB — CBC WITH DIFFERENTIAL/PLATELET
Eosinophils Relative: 0 % (ref 0–5)
HCT: 31 % — ABNORMAL LOW (ref 36.0–46.0)
Hemoglobin: 10.1 g/dL — ABNORMAL LOW (ref 12.0–15.0)
Lymphocytes Relative: 8 % — ABNORMAL LOW (ref 12–46)
Lymphs Abs: 0.5 10*3/uL — ABNORMAL LOW (ref 0.7–4.0)
MCV: 79.5 fL (ref 78.0–100.0)
Monocytes Absolute: 0.9 10*3/uL (ref 0.1–1.0)
Monocytes Relative: 14 % — ABNORMAL HIGH (ref 3–12)
RBC: 3.9 MIL/uL (ref 3.87–5.11)
WBC: 6.7 10*3/uL (ref 4.0–10.5)

## 2012-09-07 LAB — URINALYSIS, ROUTINE W REFLEX MICROSCOPIC
Glucose, UA: NEGATIVE mg/dL
Hgb urine dipstick: NEGATIVE
Leukocytes, UA: NEGATIVE
Specific Gravity, Urine: 1.026 (ref 1.005–1.030)

## 2012-09-07 MED ORDER — ALBUTEROL SULFATE HFA 108 (90 BASE) MCG/ACT IN AERS: 1.0000 | INHALATION_SPRAY | Freq: Four times a day (QID) | RESPIRATORY_TRACT | Status: DC | PRN

## 2012-09-07 MED ORDER — ALBUTEROL SULFATE (5 MG/ML) 0.5% IN NEBU
5.0000 mg | INHALATION_SOLUTION | Freq: Once | RESPIRATORY_TRACT | Status: AC
  Administered 2012-09-07: 5 mg via RESPIRATORY_TRACT
  Filled 2012-09-07: qty 1

## 2012-09-07 MED ORDER — ONDANSETRON HCL 4 MG/2ML IJ SOLN
4.0000 mg | Freq: Once | INTRAMUSCULAR | Status: DC
  Filled 2012-09-07: qty 2

## 2012-09-07 MED ORDER — ONDANSETRON 4 MG PO TBDP: 4.0000 mg | ORAL_TABLET | Freq: Three times a day (TID) | ORAL | Status: DC | PRN

## 2012-09-07 MED ORDER — HYDROMORPHONE HCL 2 MG PO TABS: 2.0000 mg | ORAL_TABLET | ORAL | Status: DC | PRN

## 2012-09-07 NOTE — ED Notes (Signed)
Lab sts CMP was clotted and need to be re-collect.

## 2012-09-07 NOTE — Telephone Encounter (Signed)
S/w sister re appt for 1/31. Returned call to pt's mom but was not able to reach her. Per sister she will give the message. Mom to call back if pt cannot come.

## 2012-09-07 NOTE — ED Notes (Signed)
[id number]<YN> Expected date:<BR> Expected time:<BR> Means of arrival:<BR> Comments:<BR> EMS/elderly/fall

## 2012-09-07 NOTE — ED Notes (Addendum)
Hospice has been contacted to give Pt a ride home.  [facility] will be taking Pt and Pt's mother home.

## 2012-09-07 NOTE — ED Notes (Addendum)
Pt presenting to ed via ems with c/o sent by hospice nurse for being more lethargic today and weak. Pt states she has abdominal pain today with positive nausea no vomiting. Pt is alert at this time. Per ems hospice nurse states that patient is just not herself today

## 2012-09-07 NOTE — ED Provider Notes (Signed)
4:45 PM Assumed care of patient from Dr. Rennis Chris in signout with urinalysis and CMP pending. Fairly unremarkable aside from elevated alkaline phosphatase. This is not surprising given patient's history of metastatic cancer to bone. Patient is on hospice and would like to be discharged. There does not appear to be a the merchant medical processes require further workup at this time. Patient is requesting a prescription for dilaudid and an antiemetic. I feel this is reasonable.   Raeford Razor, MD 09/07/12 [phone]

## 2012-09-07 NOTE — ED Provider Notes (Signed)
History     CSN: [id number]  Arrival date & time 09/07/12  1243   First MD Initiated Contact with Patient 09/07/12 1347      Chief Complaint  Patient presents with  . lethargic   . Abdominal Pain    (Consider location/radiation/quality/duration/timing/severity/associated sxs/prior treatment) HPI Patient with cough since yesterday. Denies abdominal pain. Denies nausea or vomiting. No fever. She does admit to shortness of breath. Cough is nonproductive. Denies nausea or vomiting. Presently hungry. Also admits to mild generalized weakness. No treatment prior to coming here nothing makes symptoms better or worse. Past Medical History  Diagnosis Date  . Breast lump   . Thyroid disease   . Hypertension   . Hx of radiation therapy 06/2010, 10/2009, 11/2011    L breast, T3-T12 spine, L-S spine  . 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 Breast cancer 09/2009    L breast, ER+, PR-, Her 2 -  . History of radiation therapy 10/27/11-11/15/11    lumbar/sacral spine/18MV photons    Past Surgical History  Procedure Date  . Knee arthroscopy     left  . Breast surgery     left lumpectomy  . Knee cartilage surgery 2006    No family history on file.  History  Substance Use Topics  . Smoking status: Former Smoker    Quit date: 08/25/2007  . Smokeless tobacco: Never Used  . Alcohol Use: No    OB History    Grav Para Term Preterm Abortions TAB SAB Ect Mult Living                  Review of Systems  HENT: Negative.   Respiratory: Positive for cough and shortness of breath.   Cardiovascular: Negative.   Gastrointestinal: Negative.   Musculoskeletal: Negative.   Skin: Negative.   Neurological: Positive for weakness.  Hematological: Negative.   Psychiatric/Behavioral: Negative.   All other systems reviewed and are negative.    Allergies  Review of patient's allergies indicates no known allergies.  Home Medications   Current Outpatient Rx  Name  Route  Sig  Dispense  Refill   . ALBUTEROL SULFATE (5 MG/ML) 0.5% IN NEBU   Nebulization   Take 0.5 mLs (2.5 mg total) by nebulization every 6 (six) hours as needed for wheezing or shortness of breath.   20 mL   1   . ANASTROZOLE 1 MG PO TABS      TAKE 1 TABLET BY MOUTH DAILY   30 tablet   2   . CHOLESTYRAMINE 4 G PO PACK      1 packet by mouth with food twice daily as needed for diarrhea   60 each   12   . DOCUSATE SODIUM 100 MG PO CAPS   Oral   Take 100 mg by mouth daily.         Marland Kitchen ENOXAPARIN SODIUM 60 MG/0.6ML [place] SOLN   Subcutaneous   Inject 0.6 mLs (60 mg total) into the skin 2 (two) times daily.   30 Syringe   3   . FENTANYL 50 MCG/HR TD PT72   Transdermal   Place 1 patch (50 mcg total) onto the skin every 3 (three) days.   5 patch   0     [facility]   . FUROSEMIDE 40 MG PO TABS   Oral   Take 1 tablet (40 mg total) by mouth daily.   30 tablet   0   . GUAIFENESIN-DM  100-10 MG/5ML PO SYRP   Oral   Take 5 mLs by mouth every 4 (four) hours as needed for cough.   118 mL   0   . HYDROMORPHONE HCL 4 MG PO TABS   Oral   Take 1 tablet (4 mg total) by mouth every 4 (four) hours as needed for pain. For pain   60 tablet   0   . LEVOFLOXACIN 750 MG PO TABS   Oral   Take 1 tablet (750 mg total) by mouth daily.   10 tablet   0   . LEVOTHYROXINE SODIUM 150 MCG PO TABS      TAKE 1 TABLET BY MOUTH DAILY   30 tablet   2   . LORAZEPAM 1 MG PO TABS      Take one tablet BID  prn anxiety   30 tablet   2     Requested by Hospice.  Called in to [facility]   . METOCLOPRAMIDE HCL 10 MG PO TABS      TAKE 1 TABLET BY MOUTH FOUR TIMES DAILY   60 tablet   1   . ONDANSETRON 4 MG PO TBDP   Oral   Take 8 mg by mouth every 8 (eight) hours as needed. For nausea         . PREDNISONE 5 MG PO TABS   Oral   Take 1 tablet (5 mg total) by mouth daily.   30 tablet   1   . PRESCRIPTION MEDICATION   Intravenous   Inject 4 mg into the vein. Zometa every 2 months          . PROMETHAZINE HCL 25 MG PO TABS   Oral   Take 1 tablet (25 mg total) by mouth every 6 (six) hours as needed. Nausea   30 tablet   1     BP 118/84  Pulse 112  Resp 28  SpO2 94%  Physical Exam  Nursing note and vitals reviewed. Constitutional: She is oriented to person, place, and time. No distress.       Cachectic  HENT:  Head: Normocephalic and atraumatic.  Eyes: Conjunctivae normal are normal. Pupils are equal, round, and reactive to light.  Neck: Neck supple. No tracheal deviation present. No thyromegaly present.  Cardiovascular: Normal rate and regular rhythm.   No murmur heard. Pulmonary/Chest: Effort normal.       No respiratory distress speaks in paragraphs. No use of accessory muscles. Diffuse scattered rhonchi  Abdominal: Soft. Bowel sounds are normal. She exhibits no distension. There is tenderness.       Mild diffuse tenderness  Musculoskeletal: Normal range of motion. She exhibits edema. She exhibits no tenderness.       1+ pretibial edema bilaterally  Neurological: She is alert and oriented to person, place, and time. Coordination normal.  Skin: Skin is warm and dry. No rash noted.  Psychiatric: She has a normal mood and affect.    ED Course  Procedures (including critical care time)  Date: 09/07/2012  Rate:   Rhythm: sinus tachycardia  QRS Axis: normal  Intervals: normal  ST/T Wave abnormalities: normal  Conduction Disutrbances:none  Narrative Interpretation:   Old EKG Reviewed: unchanged Unchanged from 07/31/2012 interpreted by me  Labs Reviewed  CBC WITH DIFFERENTIAL  COMPREHENSIVE METABOLIC PANEL  LIPASE, BLOOD  URINALYSIS, MICROSCOPIC ONLY   No results found.   No diagnosis found.  Patient ate lunch in the emergency department. This improved after treatment with a purulent nebulizer treatment  Chest xray reviewed by me Results for orders placed during the hospital encounter of 09/07/12  CBC WITH DIFFERENTIAL      Component Value Range    WBC 6.7  4.0 - 10.5 K/uL   RBC 3.90  3.87 - 5.11 MIL/uL   Hemoglobin 10.1 (*) 12.0 - 15.0 g/dL   HCT 78.4 (*) 69.6 - 29.5 %   MCV 79.5  78.0 - 100.0 fL   MCH 25.9 (*) 26.0 - 34.0 pg   MCHC 32.6  30.0 - 36.0 g/dL   RDW 28.4 (*) 13.2 - 44.0 %   Platelets 380  150 - 400 K/uL   Neutrophils Relative 78 (*) 43 - 77 %   Neutro Abs 5.2  1.7 - 7.7 K/uL   Lymphocytes Relative 8 (*) 12 - 46 %   Lymphs Abs 0.5 (*) 0.7 - 4.0 K/uL   Monocytes Relative 14 (*) 3 - 12 %   Monocytes Absolute 0.9  0.1 - 1.0 K/uL   Eosinophils Relative 0  0 - 5 %   Eosinophils Absolute 0.0  0.0 - 0.7 K/uL   Basophils Relative 0  0 - 1 %   Basophils Absolute 0.0  0.0 - 0.1 K/uL  PROTIME-INR      Component Value Range   Prothrombin Time 20.3 (*) 11.6 - 15.2 seconds   INR 1.81 (*) 0.00 - 1.49   Dg Chest 2 View  09/07/2012  *RADIOLOGY REPORT*  Clinical Data: Cough and chest congestion and wheezing.  Metastatic breast cancer.  CHEST - 2 VIEW  Comparison: 12/27, 12/23, and 05/23/2012  Findings: The patient has slight increase in the left pleural effusion.  Chronic consolidation in the left lung apex is unchanged.  Slightly improved aeration in the right upper lobe.  Slight increased infiltrate in the right lower lobe.  Right hilar mass and mediastinal adenopathy and lymphangitic tumor are felt to exist in the right lung.  Small right pleural effusion.  Multiple osseous metastases with pathologic fractures in the spine as previously demonstrated.  IMPRESSION:  1.  Slight increase in the left pleural effusion. 2.  Increased disease at the right lung base.  Slightly improved aeration in the right upper lobe. 3. Increased right perihilar tumor.   Original Report Authenticated By: Francene Boyers, M.D.     MDM  Pt Signed out to Dr. Juleen China 415 pm Diagnosis #1cough #2 pleural effusion #3 abdominal pain  #4 anemia      Doug Sou, MD 09/07/12 1626

## 2012-09-07 NOTE — Progress Notes (Signed)
Room [facility] ED 2 - [place] - [facility] RN Visit-R.Elior Robinette RN  Related ED visit to [facility] diagnosis of metastatic breast cancer.   Pt is FULL code.    Pt alert & oriented, sitting upright on ED stretcher, with complaints of abdominal pain generalized down to her legs.  BLE edema.  Mother is present at bedside.  Pt was found by Hospice RN pale, LOC - with poor responsiveness, 02 sats in low 80's.  Discussed with ED MD Dr. Shela Commons and he states he will give neb treatment, obtain labs and most likely discharge home as pt states she does not want to be admitted.     Patient's home medication list is on shadow chart.   Please call [facility] @ [phone]- ask for RN Liaison or after hours,ask for on-call RN with any hospice needs.    Thank you.  [name], RN  [facility]  Hospice Liaison

## 2012-09-08 ENCOUNTER — Ambulatory Visit: Payer: Self-pay

## 2012-09-08 ENCOUNTER — Ambulatory Visit (HOSPITAL_BASED_OUTPATIENT_CLINIC_OR_DEPARTMENT_OTHER): Admitting: Pharmacist

## 2012-09-08 ENCOUNTER — Other Ambulatory Visit: Payer: Self-pay | Admitting: Lab

## 2012-09-08 ENCOUNTER — Other Ambulatory Visit: Payer: Self-pay | Admitting: *Deleted

## 2012-09-08 DIAGNOSIS — C50919 Malignant neoplasm of unspecified site of unspecified female breast: Secondary | ICD-10-CM

## 2012-09-08 DIAGNOSIS — I2699 Other pulmonary embolism without acute cor pulmonale: Secondary | ICD-10-CM

## 2012-09-08 MED ORDER — FENTANYL 50 MCG/HR TD PT72: 1.0000 | MEDICATED_PATCH | TRANSDERMAL | Status: DC

## 2012-09-11 ENCOUNTER — Ambulatory Visit: Payer: Self-pay | Admitting: Pharmacist

## 2012-09-11 ENCOUNTER — Telehealth: Payer: Self-pay | Admitting: Pharmacist

## 2012-09-11 DIAGNOSIS — I2699 Other pulmonary embolism without acute cor pulmonale: Secondary | ICD-10-CM

## 2012-09-11 DIAGNOSIS — C50919 Malignant neoplasm of unspecified site of unspecified female breast: Secondary | ICD-10-CM

## 2012-09-11 LAB — POCT INR: INR: 1.8

## 2012-09-11 NOTE — Progress Notes (Signed)
Spoke to [place], Armed forces technical officer.  INR still slightly below goal INR of 2-3.  INR has been 1.8-3.4 on this dose.  Will continue Coumadin 2.5mg  daily for 1 more week.  If INR still low next week, would increase Coumadin dose.  Hospice RN will call PT/INR results 09/18/12.

## 2012-09-11 NOTE — Patient Instructions (Signed)
Continue 2.5mg  daily. Recheck INR with next home health visit on 09/11/12.

## 2012-09-11 NOTE — Progress Notes (Signed)
INR from 09/07/12 = 1.81 from ED visit for weakness, abdominal pain, cough and pleural effusion. No further issues per hospice RN. Continue 2.5mg  daily. Recheck INR with next home health visit on 09/11/12.

## 2012-09-14 ENCOUNTER — Other Ambulatory Visit: Payer: Self-pay | Admitting: Oncology

## 2012-09-18 ENCOUNTER — Ambulatory Visit: Payer: Self-pay | Admitting: Pharmacist

## 2012-09-18 DIAGNOSIS — I2699 Other pulmonary embolism without acute cor pulmonale: Secondary | ICD-10-CM

## 2012-09-18 DIAGNOSIS — C50919 Malignant neoplasm of unspecified site of unspecified female breast: Secondary | ICD-10-CM

## 2012-09-18 LAB — POCT INR: INR: 3

## 2012-09-18 NOTE — Progress Notes (Signed)
Spoke to [place], Armed forces technical officer, who check PT/INR.  INR remains at goal on Coumadin 2.5mg  daily.  No changes in medications.  Will continue coumadin 2.5mg  daily and check PT/INR in 2 weeks.  Orders given to [facility] and she will call with results on Feb 24th.

## 2012-09-19 ENCOUNTER — Other Ambulatory Visit: Payer: Self-pay | Admitting: *Deleted

## 2012-09-19 ENCOUNTER — Other Ambulatory Visit: Payer: Self-pay | Admitting: Oncology

## 2012-09-19 DIAGNOSIS — C50919 Malignant neoplasm of unspecified site of unspecified female breast: Secondary | ICD-10-CM

## 2012-09-19 MED ORDER — HYDROMORPHONE HCL 4 MG PO TABS: 4.0000 mg | ORAL_TABLET | ORAL | Status: DC | PRN

## 2012-09-20 ENCOUNTER — Telehealth: Payer: Self-pay | Admitting: Oncology

## 2012-09-20 NOTE — Telephone Encounter (Signed)
Pt sister called on for zometa inf appt that pt missed. appt r/s per 2/11 pof. Sister has new d/t for 2/14.

## 2012-09-21 ENCOUNTER — Ambulatory Visit: Payer: Self-pay

## 2012-09-21 ENCOUNTER — Other Ambulatory Visit: Payer: Self-pay | Admitting: Lab

## 2012-09-22 ENCOUNTER — Other Ambulatory Visit: Payer: Self-pay | Admitting: Lab

## 2012-09-22 ENCOUNTER — Ambulatory Visit (HOSPITAL_BASED_OUTPATIENT_CLINIC_OR_DEPARTMENT_OTHER)

## 2012-09-22 ENCOUNTER — Ambulatory Visit: Payer: Self-pay

## 2012-09-22 VITALS — BP 136/91 | Temp 98.2°F

## 2012-09-22 DIAGNOSIS — C50919 Malignant neoplasm of unspecified site of unspecified female breast: Secondary | ICD-10-CM

## 2012-09-22 DIAGNOSIS — C7951 Secondary malignant neoplasm of bone: Secondary | ICD-10-CM

## 2012-09-22 DIAGNOSIS — F329 Major depressive disorder, single episode, unspecified: Secondary | ICD-10-CM

## 2012-09-22 DIAGNOSIS — F410 Panic disorder [episodic paroxysmal anxiety] without agoraphobia: Secondary | ICD-10-CM

## 2012-09-22 DIAGNOSIS — C801 Malignant (primary) neoplasm, unspecified: Secondary | ICD-10-CM

## 2012-09-22 MED ORDER — LORAZEPAM 2 MG/ML IJ SOLN
0.5000 mg | Freq: Once | INTRAMUSCULAR | Status: AC
  Administered 2012-09-22: 0.5 mg via INTRAVENOUS

## 2012-09-22 MED ORDER — ZOLEDRONIC ACID 4 MG/100ML IV SOLN
4.0000 mg | Freq: Once | INTRAVENOUS | Status: AC
  Administered 2012-09-22: 4 mg via INTRAVENOUS
  Filled 2012-09-22: qty 100

## 2012-09-22 MED ORDER — SODIUM CHLORIDE 0.9 % IV SOLN
Freq: Once | INTRAVENOUS | Status: AC
  Administered 2012-09-22: 11:00:00 via INTRAVENOUS

## 2012-09-22 NOTE — Patient Instructions (Signed)
[facility] Discharge Instructions for Patients Receiving Chemotherapy  Today you received the following chemotherapy agents: zometa, ativan  To help prevent nausea and vomiting after your treatment, we encourage you to take your nausea medication.  Take it as often as prescribed.     If you develop nausea and vomiting that is not controlled by your nausea medication, call the clinic. If it is after clinic hours your family physician or the after hours number for the clinic or go to the Emergency Department.   BELOW ARE SYMPTOMS THAT SHOULD BE REPORTED IMMEDIATELY:  *FEVER GREATER THAN 100.5 F  *CHILLS WITH OR WITHOUT FEVER  NAUSEA AND VOMITING THAT IS NOT CONTROLLED WITH YOUR NAUSEA MEDICATION  *UNUSUAL SHORTNESS OF BREATH  *UNUSUAL BRUISING OR BLEEDING  TENDERNESS IN MOUTH AND THROAT WITH OR WITHOUT PRESENCE OF ULCERS  *URINARY PROBLEMS  *BOWEL PROBLEMS  UNUSUAL RASH Items with * indicate a potential emergency and should be followed up as soon as possible.  Feel free to call the clinic you have any questions or concerns. The clinic phone number is [phone].   I have been informed and understand all the instructions given to me. I know to contact the clinic, my physician, or go to the Emergency Department if any problems should occur. I do not have any questions at this time, but understand that I may call the clinic during office hours   should I have any questions or need assistance in obtaining follow up care.    __________________________________________  _____________  __________ Signature of Patient or Authorized Representative            Date                   Time    __________________________________________ Nurse's Signature

## 2012-09-25 ENCOUNTER — Other Ambulatory Visit: Payer: Self-pay | Admitting: *Deleted

## 2012-09-25 ENCOUNTER — Other Ambulatory Visit: Payer: Self-pay | Admitting: Oncology

## 2012-09-25 DIAGNOSIS — I2699 Other pulmonary embolism without acute cor pulmonale: Secondary | ICD-10-CM

## 2012-09-25 MED ORDER — FENTANYL 50 MCG/HR TD PT72: 1.0000 | MEDICATED_PATCH | TRANSDERMAL | Status: DC

## 2012-09-25 MED ORDER — WARFARIN SODIUM 5 MG PO TABS: ORAL_TABLET | ORAL | Status: DC

## 2012-09-25 MED ORDER — VENLAFAXINE HCL ER 75 MG PO CP24: ORAL_CAPSULE | ORAL | Status: DC

## 2012-09-26 ENCOUNTER — Other Ambulatory Visit: Payer: Self-pay | Admitting: Lab

## 2012-09-28 ENCOUNTER — Other Ambulatory Visit: Payer: Self-pay | Admitting: *Deleted

## 2012-09-28 MED ORDER — LORAZEPAM 1 MG PO TABS: ORAL_TABLET | ORAL | Status: DC

## 2012-09-28 MED ORDER — ONDANSETRON 4 MG PO TBDP: 4.0000 mg | ORAL_TABLET | Freq: Three times a day (TID) | ORAL | Status: DC | PRN

## 2012-09-28 NOTE — Telephone Encounter (Signed)
Call received from Kristin Birk RN with [facility] requesting refill on ondansetron and ativan.  Also stated pt was having difficulty swallowing muccinex and per hospice protocol switched over to robitussin with guaifenesin.  Above scripts obtained and faxed to pharmacy

## 2012-09-29 ENCOUNTER — Other Ambulatory Visit: Payer: Self-pay | Admitting: Oncology

## 2012-10-02 ENCOUNTER — Inpatient Hospital Stay (HOSPITAL_COMMUNITY)

## 2012-10-02 ENCOUNTER — Encounter (HOSPITAL_COMMUNITY): Payer: Self-pay | Admitting: Emergency Medicine

## 2012-10-02 ENCOUNTER — Inpatient Hospital Stay (HOSPITAL_COMMUNITY): Admission: AD | Admit: 2012-10-02 | Payer: Medicare Other | Source: Ambulatory Visit | Admitting: Oncology

## 2012-10-02 ENCOUNTER — Inpatient Hospital Stay (HOSPITAL_COMMUNITY)
Admission: EM | Admit: 2012-10-02 | Discharge: 2012-10-06 | DRG: 840 | Disposition: A | Discharge status: Hospice | Attending: Oncology | Admitting: Oncology

## 2012-10-02 ENCOUNTER — Telehealth: Payer: Self-pay | Admitting: *Deleted

## 2012-10-02 DIAGNOSIS — C50919 Malignant neoplasm of unspecified site of unspecified female breast: Secondary | ICD-10-CM | POA: Diagnosis present

## 2012-10-02 DIAGNOSIS — C7800 Secondary malignant neoplasm of unspecified lung: Secondary | ICD-10-CM | POA: Diagnosis present

## 2012-10-02 DIAGNOSIS — E079 Disorder of thyroid, unspecified: Secondary | ICD-10-CM | POA: Diagnosis present

## 2012-10-02 DIAGNOSIS — Z79899 Other long term (current) drug therapy: Secondary | ICD-10-CM

## 2012-10-02 DIAGNOSIS — F419 Anxiety disorder, unspecified: Secondary | ICD-10-CM | POA: Diagnosis present

## 2012-10-02 DIAGNOSIS — E871 Hypo-osmolality and hyponatremia: Secondary | ICD-10-CM

## 2012-10-02 DIAGNOSIS — C7951 Secondary malignant neoplasm of bone: Secondary | ICD-10-CM | POA: Diagnosis present

## 2012-10-02 DIAGNOSIS — R0602 Shortness of breath: Secondary | ICD-10-CM

## 2012-10-02 DIAGNOSIS — G8929 Other chronic pain: Secondary | ICD-10-CM | POA: Diagnosis present

## 2012-10-02 DIAGNOSIS — C771 Secondary and unspecified malignant neoplasm of intrathoracic lymph nodes: Principal | ICD-10-CM | POA: Diagnosis present

## 2012-10-02 DIAGNOSIS — D689 Coagulation defect, unspecified: Secondary | ICD-10-CM

## 2012-10-02 DIAGNOSIS — Z66 Do not resuscitate: Secondary | ICD-10-CM | POA: Diagnosis present

## 2012-10-02 DIAGNOSIS — Z7901 Long term (current) use of anticoagulants: Secondary | ICD-10-CM

## 2012-10-02 DIAGNOSIS — IMO0002 Reserved for concepts with insufficient information to code with codable children: Secondary | ICD-10-CM

## 2012-10-02 DIAGNOSIS — D649 Anemia, unspecified: Secondary | ICD-10-CM | POA: Diagnosis present

## 2012-10-02 DIAGNOSIS — F411 Generalized anxiety disorder: Secondary | ICD-10-CM | POA: Diagnosis present

## 2012-10-02 DIAGNOSIS — Z9221 Personal history of antineoplastic chemotherapy: Secondary | ICD-10-CM

## 2012-10-02 DIAGNOSIS — Z923 Personal history of irradiation: Secondary | ICD-10-CM

## 2012-10-02 DIAGNOSIS — D630 Anemia in neoplastic disease: Secondary | ICD-10-CM | POA: Diagnosis present

## 2012-10-02 DIAGNOSIS — C50911 Malignant neoplasm of unspecified site of right female breast: Secondary | ICD-10-CM

## 2012-10-02 DIAGNOSIS — R52 Pain, unspecified: Secondary | ICD-10-CM

## 2012-10-02 DIAGNOSIS — C787 Secondary malignant neoplasm of liver and intrahepatic bile duct: Secondary | ICD-10-CM | POA: Diagnosis present

## 2012-10-02 DIAGNOSIS — J9600 Acute respiratory failure, unspecified whether with hypoxia or hypercapnia: Secondary | ICD-10-CM | POA: Diagnosis present

## 2012-10-02 DIAGNOSIS — I10 Essential (primary) hypertension: Secondary | ICD-10-CM | POA: Diagnosis present

## 2012-10-02 DIAGNOSIS — K859 Acute pancreatitis without necrosis or infection, unspecified: Secondary | ICD-10-CM

## 2012-10-02 DIAGNOSIS — Z87891 Personal history of nicotine dependence: Secondary | ICD-10-CM

## 2012-10-02 LAB — URINALYSIS, ROUTINE W REFLEX MICROSCOPIC
Bilirubin Urine: NEGATIVE
Leukocytes, UA: NEGATIVE
Nitrite: NEGATIVE
Specific Gravity, Urine: 1.014 (ref 1.005–1.030)
Urobilinogen, UA: 0.2 mg/dL (ref 0.0–1.0)
pH: 6 (ref 5.0–8.0)

## 2012-10-02 LAB — TSH: TSH: 11.252 u[IU]/mL — ABNORMAL HIGH (ref 0.350–4.500)

## 2012-10-02 LAB — COMPREHENSIVE METABOLIC PANEL
ALT: 9 U/L (ref 0–35)
AST: 18 U/L (ref 0–37)
Albumin: 2.4 g/dL — ABNORMAL LOW (ref 3.5–5.2)
CO2: 32 mEq/L (ref 19–32)
Calcium: 8.1 mg/dL — ABNORMAL LOW (ref 8.4–10.5)
Chloride: 99 mEq/L (ref 96–112)
GFR calc non Af Amer: 68 mL/min — ABNORMAL LOW (ref >90)
Sodium: 137 mEq/L (ref 135–145)

## 2012-10-02 LAB — CBC
HCT: 31.3 % — ABNORMAL LOW (ref 36.0–46.0)
MCH: 25.3 pg — ABNORMAL LOW (ref 26.0–34.0)
MCV: 79 fL (ref 78.0–100.0)
Platelets: 397 10*3/uL (ref 150–400)
RBC: 3.96 MIL/uL (ref 3.87–5.11)

## 2012-10-02 LAB — LACTIC ACID, PLASMA: Lactic Acid, Venous: 1.1 mmol/L (ref 0.5–2.2)

## 2012-10-02 MED ORDER — ALBUTEROL (5 MG/ML) CONTINUOUS INHALATION SOLN
INHALATION_SOLUTION | RESPIRATORY_TRACT | Status: AC
  Filled 2012-10-02: qty 20

## 2012-10-02 MED ORDER — PREDNISONE 5 MG PO TABS: 5.0000 mg | ORAL_TABLET | Freq: Every day | ORAL | Status: DC

## 2012-10-02 MED ORDER — IPRATROPIUM BROMIDE 0.02 % IN SOLN
0.5000 mg | Freq: Four times a day (QID) | RESPIRATORY_TRACT | Status: DC
  Administered 2012-10-02 – 2012-10-04 (×5): 0.5 mg via RESPIRATORY_TRACT
  Filled 2012-10-02 (×6): qty 2.5

## 2012-10-02 MED ORDER — VENLAFAXINE HCL ER 75 MG PO CP24
75.0000 mg | ORAL_CAPSULE | Freq: Every day | ORAL | Status: DC
  Administered 2012-10-03 – 2012-10-06 (×4): 75 mg via ORAL
  Filled 2012-10-02 (×5): qty 1

## 2012-10-02 MED ORDER — METHYLPREDNISOLONE SODIUM SUCC 125 MG IJ SOLR
60.0000 mg | Freq: Four times a day (QID) | INTRAMUSCULAR | Status: DC
  Administered 2012-10-02 – 2012-10-05 (×11): 60 mg via INTRAVENOUS
  Administered 2012-10-05: 12:00:00 via INTRAVENOUS
  Administered 2012-10-06 (×3): 60 mg via INTRAVENOUS
  Filled 2012-10-02 (×19): qty 0.96

## 2012-10-02 MED ORDER — METOCLOPRAMIDE HCL 10 MG PO TABS: 10.0000 mg | ORAL_TABLET | Freq: Four times a day (QID) | ORAL | Status: DC | PRN

## 2012-10-02 MED ORDER — SODIUM CHLORIDE 0.45 % IV SOLN
INTRAVENOUS | Status: DC
  Administered 2012-10-02: 15:00:00 via INTRAVENOUS

## 2012-10-02 MED ORDER — PANTOPRAZOLE SODIUM 40 MG IV SOLR
40.0000 mg | Freq: Every day | INTRAVENOUS | Status: DC
  Administered 2012-10-02 – 2012-10-03 (×2): 40 mg via INTRAVENOUS
  Filled 2012-10-02 (×3): qty 40

## 2012-10-02 MED ORDER — IPRATROPIUM BROMIDE HFA 17 MCG/ACT IN AERS
2.0000 | INHALATION_SPRAY | RESPIRATORY_TRACT | Status: DC
  Filled 2012-10-02: qty 12.9

## 2012-10-02 MED ORDER — IPRATROPIUM BROMIDE 0.02 % IN SOLN
1.0000 mg | Freq: Once | RESPIRATORY_TRACT | Status: AC
  Administered 2012-10-02: 1 mg via RESPIRATORY_TRACT
  Filled 2012-10-02: qty 2.5

## 2012-10-02 MED ORDER — DEXTROSE 5 % IV SOLN
1.0000 g | Freq: Two times a day (BID) | INTRAVENOUS | Status: DC
  Administered 2012-10-02 – 2012-10-06 (×8): 1 g via INTRAVENOUS
  Filled 2012-10-02 (×10): qty 1

## 2012-10-02 MED ORDER — ALBUTEROL (5 MG/ML) CONTINUOUS INHALATION SOLN
10.0000 mg | INHALATION_SOLUTION | RESPIRATORY_TRACT | Status: AC
  Administered 2012-10-02: 10 mg via RESPIRATORY_TRACT

## 2012-10-02 MED ORDER — FENTANYL 50 MCG/HR TD PT72: 50.0000 ug | MEDICATED_PATCH | TRANSDERMAL | Status: DC

## 2012-10-02 MED ORDER — ALBUTEROL SULFATE (5 MG/ML) 0.5% IN NEBU
2.5000 mg | INHALATION_SOLUTION | Freq: Four times a day (QID) | RESPIRATORY_TRACT | Status: DC
  Administered 2012-10-02: 16:00:00 via RESPIRATORY_TRACT

## 2012-10-02 MED ORDER — ALBUTEROL SULFATE (5 MG/ML) 0.5% IN NEBU
2.5000 mg | INHALATION_SOLUTION | Freq: Four times a day (QID) | RESPIRATORY_TRACT | Status: DC
  Administered 2012-10-02 – 2012-10-04 (×5): 2.5 mg via RESPIRATORY_TRACT
  Filled 2012-10-02 (×6): qty 0.5

## 2012-10-02 MED ORDER — VITAMIN K1 1 MG/0.5ML IJ SOLN
1.0000 mg | Freq: Once | INTRAMUSCULAR | Status: AC
  Administered 2012-10-02: 1 mg via SUBCUTANEOUS
  Filled 2012-10-02: qty 0.1

## 2012-10-02 MED ORDER — PANTOPRAZOLE SODIUM 40 MG PO TBEC
40.0000 mg | DELAYED_RELEASE_TABLET | Freq: Every day | ORAL | Status: DC
  Administered 2012-10-02: 40 mg via ORAL
  Filled 2012-10-02: qty 1

## 2012-10-02 MED ORDER — LEVOTHYROXINE SODIUM 150 MCG PO TABS
150.0000 ug | ORAL_TABLET | Freq: Every day | ORAL | Status: DC
  Administered 2012-10-03 – 2012-10-06 (×4): 150 ug via ORAL
  Filled 2012-10-02 (×5): qty 1

## 2012-10-02 MED ORDER — FUROSEMIDE 40 MG PO TABS
40.0000 mg | ORAL_TABLET | Freq: Every day | ORAL | Status: DC
  Administered 2012-10-02 – 2012-10-06 (×5): 40 mg via ORAL
  Filled 2012-10-02 (×5): qty 1

## 2012-10-02 MED ORDER — LORAZEPAM 0.5 MG PO TABS
0.5000 mg | ORAL_TABLET | Freq: Four times a day (QID) | ORAL | Status: DC | PRN
  Administered 2012-10-03 – 2012-10-05 (×2): 0.5 mg via ORAL
  Filled 2012-10-02 (×2): qty 1

## 2012-10-02 MED ORDER — ONDANSETRON 4 MG PO TBDP
4.0000 mg | ORAL_TABLET | Freq: Three times a day (TID) | ORAL | Status: DC | PRN
  Filled 2012-10-02: qty 1

## 2012-10-02 MED ORDER — ALUM & MAG HYDROXIDE-SIMETH 200-200-20 MG/5ML PO SUSP: 30.0000 mL | Freq: Four times a day (QID) | ORAL | Status: DC | PRN

## 2012-10-02 MED ORDER — METOCLOPRAMIDE HCL 5 MG/ML IJ SOLN
10.0000 mg | Freq: Three times a day (TID) | INTRAMUSCULAR | Status: DC | PRN
  Filled 2012-10-02: qty 2

## 2012-10-02 MED ORDER — GUAIFENESIN-DM 100-10 MG/5ML PO SYRP: 5.0000 mL | ORAL_SOLUTION | ORAL | Status: DC | PRN

## 2012-10-02 MED ORDER — VITAMINS A & D EX OINT
TOPICAL_OINTMENT | CUTANEOUS | Status: AC
  Administered 2012-10-02: 5
  Filled 2012-10-02: qty 5

## 2012-10-02 MED ORDER — FUROSEMIDE 10 MG/ML IJ SOLN
20.0000 mg | Freq: Two times a day (BID) | INTRAMUSCULAR | Status: DC
  Administered 2012-10-02 – 2012-10-04 (×5): 20 mg via INTRAVENOUS
  Filled 2012-10-02 (×8): qty 2

## 2012-10-02 MED ORDER — DOCUSATE SODIUM 100 MG PO CAPS
100.0000 mg | ORAL_CAPSULE | Freq: Every day | ORAL | Status: DC
  Administered 2012-10-02 – 2012-10-06 (×5): 100 mg via ORAL
  Filled 2012-10-02 (×5): qty 1

## 2012-10-02 MED ORDER — ANASTROZOLE 1 MG PO TABS
1.0000 mg | ORAL_TABLET | Freq: Every day | ORAL | Status: DC
  Administered 2012-10-03 – 2012-10-06 (×4): 1 mg via ORAL
  Filled 2012-10-02 (×5): qty 1

## 2012-10-02 MED ORDER — ONDANSETRON 8 MG/NS 50 ML IVPB
8.0000 mg | Freq: Four times a day (QID) | INTRAVENOUS | Status: DC | PRN
  Administered 2012-10-04: 8 mg via INTRAVENOUS
  Filled 2012-10-02 (×2): qty 8

## 2012-10-02 MED ORDER — HYDROMORPHONE HCL 4 MG PO TABS
4.0000 mg | ORAL_TABLET | ORAL | Status: DC | PRN
  Administered 2012-10-03 – 2012-10-05 (×3): 4 mg via ORAL
  Filled 2012-10-02 (×3): qty 1

## 2012-10-02 NOTE — ED Notes (Addendum)
Per EMS: pt c/o of SOB and pain in left leg and arm. 10 albuterol and 1 Atrovent, 125 solumedrol. 20 g in right hand.

## 2012-10-02 NOTE — ED Notes (Signed)
Patient transported to X-ray 

## 2012-10-02 NOTE — Progress Notes (Signed)
UNABLE TO INSERT FOLEY, HAS SOME SWELLING AT THE URETHRAL SITE, TWO RNS ATTEMPTED INSERTION, PT VOIDING IN BEDPAN.

## 2012-10-02 NOTE — Telephone Encounter (Signed)
PROTIME RESULTS: PT. 82.7/INR 6.9  PT. IS VERY LETHARGIC. SHE IS HAVING DIFFICULTY BREATHING AND IS VERY CONGESTED. ALL EXTREMITIES ARE SWOLLEN. PT.'S MOTHER WANTS PT. TAKEN TO THE HOSPITAL. THIS INFORMATION WAS GIVEN TO DR.MAGRINAT'S NURSE, VAL DODD,RN.

## 2012-10-02 NOTE — H&P (Signed)
HISTORY OF PRESENT ILLNESS:  Kristin [name] was originally seen in the hospital for an initial consult on September 17, 2009. At that time, she presented with back pain and was found to have a markedly abnormal left breast and axilla as well as lytic bone lesions. We obtained a biopsy of the left breast on February 8th which showed a high-grade tumor which was ER 100% receptor positive but Her-2 and progesterone receptor negative. The proliferation fraction was elevated at 33%. We then obtained a bone biopsy on February 11th and her T12 vertebral body indeed was found to harbor an estrogen receptor positive, progesterone receptor negative carcinoma consistent with her breast primary.  She had complete staging studies including a brain MRI with and without contrast which showed no evidence of metastatic disease to the brain, and a CT of the abdomen and pelvis which showed no liver lesions and no other lesions other than in the bones. There was a fibroid uterus incidentally noted. She had a CT of the chest previously (February 7th) which showed only some chronic appearing changes in the left upper lobe, consistent with her history of treated tuberculosis. Again, there were multiple lytic bone lesions.  Kristin [name] was treated in the neoadjuvant setting of docetaxel cyclophosphamide and doxorubicin x6, completed June 2011. She also received radiation therapy to T3 through T12.  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 Grade 2, negative sentinel lymph node. Patient was started on tamoxifen in July 2011, in addition to zoledronic acid which is been given monthly. Her subsequent treatment is as detailed below.   INTERVAL HISTORY:  Kristin [name]'s family called todat concerned that the patient was more congested, with severe difficulty breathing and very lethargic. Hospice nurse evaluated the patient at home and  found the INR to be >6. She noted patient does not have an out-of-facility DNR. Accordingly family requested admission for further evaluation and management.  REVIEW OF SYSTEMS:  Kristin [name] is able to tell me that she hurts "nowhere." She is not able to give me a full ROS. She told the ED physician that she had passed out in the kitchen once in the last week..She has been progressively short of breath. No family in room.  PAST MEDICAL HISTORY:  Past Medical History   Diagnosis  Date   .  Breast lump    .  Thyroid disease    .  Hypertension    .  Hx of radiation therapy  06/2010, 10/2009, 11/2011     L breast, T3-T12 spine, L-S spine   .  met breast ca to bone  dx'd 09/2009   .  Breast cancer  09/2009     L breast, ER+, PR-, Her 2 -   .  History of radiation therapy  10/27/11-11/15/11     lumbar/sacral spine/18MV photons   PAST SURGICAL HISTORY:  Past Surgical History   Procedure  Date   .  Knee arthroscopy      left   .  Breast surgery      left lumpectomy   .  Knee cartilage surgery  2006    FAMILY HISTORY  Patient's mother is alive and in her mid 40s. She has a history of treated tuberculosis. The patient's father died from a drug overdose, and the patient does not know much about him. She has one brother with hypertension and one sister, Kristin [name]. There is  no known history of breast or ovarian cancer in the family.   GYNECOLOGIC HISTORY:  Gx P1, first pregnancy to term at age 72. Her son was premature.   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 She is currently unemployed. She has been living with her mother, Kristin [name], but now has her own place. The patient's son. Kristin [name], has graduated from high school and has applied for a job core position. He is still at home with the patient at least part-time. The patient is a Control and instrumentation engineer.  ADVANCED DIRECTIVES: DNR in place   HEALTH MAINTENANCE:  History   Substance Use Topics   .  Smoking  status:  Former Smoker     Quit date:  08/25/2007   .  Smokeless tobacco:  Never Used   .  Alcohol Use:  No   Colonoscopy:  PAP:  Bone density:  Lipid panel:  No Known Allergies   Current Outpatient Prescriptions   Medication  Sig  Dispense  Refill   .  albuterol (PROVENTIL) (5 MG/ML) 0.5% nebulizer solution  Take 0.5 mLs (2.5 mg total) by nebulization every 6 (six) hours as needed for wheezing or shortness of breath.  20 mL  1   .  anastrozole (ARIMIDEX) 1 MG tablet  TAKE 1 TABLET BY MOUTH DAILY  30 tablet  2   .  cholestyramine (QUESTRAN) 4 G packet  1 packet by mouth with food twice daily as needed for diarrhea  60 each  12   .  docusate sodium (COLACE) 100 MG capsule  Take 100 mg by mouth daily.     .  fentaNYL (DURAGESIC - DOSED MCG/HR) 50 MCG/HR  Place 1 patch (50 mcg total) onto the skin every 3 (three) days.  5 patch  0   .  furosemide (LASIX) 40 MG tablet  Take 1 tablet (40 mg total) by mouth daily.  30 tablet  0   .  guaiFENesin-dextromethorphan (ROBITUSSIN DM) 100-10 MG/5ML syrup  Take 5 mLs by mouth every 4 (four) hours as needed for cough.  118 mL  0   .  HYDROmorphone (DILAUDID) 4 MG tablet  Take 1 tablet (4 mg total) by mouth every 4 (four) hours as needed for pain. For pain  60 tablet  0   .  levothyroxine (SYNTHROID, LEVOTHROID) 150 MCG tablet  TAKE ONE TABLET BY MOUTH DAILY  30 tablet  0   .  LORazepam (ATIVAN) 1 MG tablet  Take one tablet BID prn anxiety  30 tablet  0   .  metoCLOPramide (REGLAN) 10 MG tablet  TAKE 1 TABLET BY MOUTH FOUR TIMES DAILY  60 tablet  1   .  ondansetron (ZOFRAN-ODT) 4 MG disintegrating tablet  Take 8 mg by mouth every 8 (eight) hours as needed. For nausea     .  PRESCRIPTION MEDICATION  Inject 4 mg into the vein. Zometa every 2 months     .  promethazine (PHENERGAN) 25 MG tablet  Take 1 tablet (25 mg total) by mouth every 6 (six) hours as needed. Nausea  30 tablet  1   .  enoxaparin (LOVENOX) 60 MG/0.6ML injection  Inject 0.6 mLs (60 mg total)  into the skin 2 (two) times daily.  30 Syringe  3   .  levofloxacin (LEVAQUIN) 750 MG tablet  Take 1 tablet (750 mg total) by mouth daily.  10 tablet  0   .  predniSONE (DELTASONE) 5  MG tablet  Take 1 tablet (5 mg total) by mouth daily.  30 tablet  1    OBJECTIVE:   HEENT: Oropharynx dry but clear.  Breast Exam: Deferred  Lungs: coarse wheezes and rales bilaterally throughout all lung fields Heart: Regular rate and rhythm.  Abdomen: Soft, nontender. Positive bowel sounds.  Extremities: 1+ bilateral LE pitting edema Neuro: follows commands, opens eyes briefly to voice, answers questions monosyllabilcally  LAB RESULTS:  Results for NYAIRA, HODGENS (MRN [id number]) as of 10/02/2012 18:20  Ref. Range 10/02/2012 14:31  Sodium Latest Range: 136-145 mEq/L 137  Potassium Latest Range: 3.5-5.1 mEq/L 3.8  Chloride Latest Range: 96-112 mEq/L 99  CO2 Latest Range: 19-32 mEq/L 32  BUN Latest Range: 7.0-26.0 mg/dL 10  Creatinine Latest Range: 0.50-1.10 mg/dL 4.09  Calcium Latest Range: 8.4-10.5 mg/dL 8.1 (L)  GFR calc non Af Amer Latest Range: >90 mL/min 68 (L)  GFR calc Af Amer Latest Range: >90 mL/min 78 (L)  Glucose Latest Range: 70-99 mg/dl 811 (H)  Magnesium Latest Range: 1.5-2.5 mg/dL 2.2  Alkaline Phosphatase Latest Range: 39-117 U/L 269 (H)  Albumin Latest Range: 3.5-5.2 g/dL 2.4 (L)  AST Latest Range: 0-37 U/L 18  ALT Latest Range: 0-35 U/L 9  Total Protein Latest Range: 6.4-8.3 g/dL 6.7  Total Bilirubin Latest Range: 0.3-1.2 mg/dL 0.4  WBC Latest Range: 4.0-10.5 K/uL 8.6  RBC Latest Range: 3.87-5.11 MIL/uL 3.96  Hemoglobin Latest Range: 12.0-15.0 g/dL 91.4 (L)  HCT Latest Range: 36.0-46.0 % 31.3 (L)  MCV Latest Range: 78.0-100.0 fL 79.0  MCH Latest Range: 26.0-34.0 pg 25.3 (L)  MCHC Latest Range: 30.0-36.0 g/dL 78.2  RDW Latest Range: 11.5-15.5 % 19.6 (H)  Platelets Latest Range: 150-400 K/uL 397  Prothrombin Time Latest Range: 11.6-15.2 seconds 37.7 (H)  INR Latest Range:  0.00-1.49  4.17 (H)  aPTT Latest Range: 24-37 seconds 74 (H)   STUDIES: X-ray Chest Pa And Lateral   10/02/2012  *RADIOLOGY REPORT*  Clinical Data: Shortness of breath, history of breast cancer, confusion  CHEST - 2 VIEW  Comparison: 09/07/2012  Findings: Cardiac leads obscure detail.  Lung volumes are extremely low, with crowding of the bronchovascular markings at the bases. Chronic upper retraction of the hila with biapical volume loss again noted, but with increased apical volume loss, particularly the right upper lobe with.  Increased biapical pleural thickening/fluid and increased bilateral small pleural effusions obscuring lung bases.  Irregular sclerosis of the right glenohumeral joint is stable.  Heart size is not well assessed due to overlying soft tissue structures but is grossly within normal limits.  No pneumothorax.  IMPRESSION: Increased bilateral pleural effusions with presumed bibasilar atelectasis, although pneumonia could be obscured.  Increased bilateral upper lobe volume loss.   Original Report Authenticated By: Christiana Pellant, M.D.    Dg Chest 2 View  09/07/2012  *RADIOLOGY REPORT*  Clinical Data: Cough and chest congestion and wheezing.  Metastatic breast cancer.  CHEST - 2 VIEW  Comparison: 12/27, 12/23, and 05/23/2012  Findings: The patient has slight increase in the left pleural effusion.  Chronic consolidation in the left lung apex is unchanged.  Slightly improved aeration in the right upper lobe.  Slight increased infiltrate in the right lower lobe.  Right hilar mass and mediastinal adenopathy and lymphangitic tumor are felt to exist in the right lung.  Small right pleural effusion.  Multiple osseous metastases with pathologic fractures in the spine as previously demonstrated.  IMPRESSION:  1.  Slight increase in the left pleural effusion. 2.  Increased disease at the right lung base.  Slightly improved aeration in the right upper lobe. 3. Increased right perihilar tumor.    Original Report Authenticated By: Francene Boyers, M.D.     .ASSESSMENT: A 47 y.o.  [place] woman with history of breast cancer stage IV at diagnosis February 2011, with metastases to bone , lungs and liver, now admitted with worsening SOB likely due to lymphangitic spread of tumor (1) status post 30 Gy to T-3 through T-18 October 2009  (2) status post docetaxel, cyclophosphamide and doxorubicin x6, completed in June 2011.  (3) status post Left lumpectomy and sentinel lymph node biopsy August 2011 for a 2-mm focus of residual invasive ductal carcinoma, grade 2, negative sentinel lymph node  (4) tamoxifen started July 2011, with evidence of progression March 2013  (5) status post radiation to Left breast completed November 2011  (6) s/p radiation to the lower spine, completed 11/15/2011  (7) fulvestrant/goserelin started 11/03/2011, with evidence of progression in September 2013  (8) status post palliative radiation to the right hip in September 2013  (9) Continuing goserelin and adding anastrazole, beginning 05/09/2012  (10) continuing on zolendronic acid every other month  PLAN:  I have phoned the patient's mother, Jamesetta So, who is the patient's HCPOA and discussed the situation with her and she agrees Hilari is unlikely to survive this hospitalization. She agrees to a DNR order, and conveyed this to Carlyle Lipa RN. She understands we will do our best to help Enslie's breathing and to make sure she is comfortable. Accordingly I am writing a DNR order and proceeding with diuretics, steroids, antibiotics, inhalers, and oxygen. I have also written for C-PAP PRN if patient's breathing becomes labored. My hope is we can stabilize [name] sufficiently that the family can come and spend some time with her. If she does better than expected we will consider [facility].

## 2012-10-02 NOTE — Progress Notes (Addendum)
Room [facility] ED #9 - Kristin [name] - [facility] RN Visit-R.[name] RN  Related admission to [facility] diagnosis of metastatic breast cancer.   Pt is FULL code - per patient's last hospitalization, she was a DNI.    Pt lethargic, barely arousable,  lying on ED stretcher, denies pain, appears dyspneic - extended (3-5 seconds) for rhonchus inhalation - 02 Sats 100% @ 8L [place].  Per discussion with staff RN, pt will be admitted.    Son [name] present.  Patient's home medication list is on shadow chart.  Please call [facility] @ [phone]- ask for RN Liaison or after hours,ask for on-call RN with any hospice needs.   Thank you.  [name], RN  [facility]  Hospice Liaison

## 2012-10-02 NOTE — ED Provider Notes (Signed)
History     CSN: [id number]  Arrival date & time 10/02/12  1325   First MD Initiated Contact with Patient 10/02/12 1331      Chief Complaint  Patient presents with  . Shortness of Breath    (Consider location/radiation/quality/duration/timing/severity/associated sxs/prior treatment) The history is provided by the patient and a relative.    Kristin [name] is a 47 y.o. female  with a hx of metastat presents to the Emergency Department complaining of gradual, persistent, progressively worsening respiratory distress onset 3 days ago. Associated symptoms include chest pain, difficulty breathing.  Nothing makes it better and nothing makes it worse.  Pt denies fever, chills, headache, neck pain.  Level 5 caveat as pt is confused and hypoxic on arrival    Past Medical History  Diagnosis Date  . Breast lump   . Thyroid disease   . Hypertension   . Hx of radiation therapy 06/2010, 10/2009, 11/2011    L breast, T3-T12 spine, L-S spine  . 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 Breast cancer 09/2009    L breast, ER+, PR-, Her 2 -  . History of radiation therapy 10/27/11-11/15/11    lumbar/sacral spine/18MV photons    Past Surgical History  Procedure Laterality Date  . Knee arthroscopy      left  . Breast surgery      left lumpectomy  . Knee cartilage surgery  2006    No family history on file.  History  Substance Use Topics  . Smoking status: Former Smoker    Quit date: 08/25/2007  . Smokeless tobacco: Never Used  . Alcohol Use: No    OB History   Grav Para Term Preterm Abortions TAB SAB Ect Mult Living                  Review of Systems  Unable to perform ROS: Mental status change  Constitutional: Negative for fever, diaphoresis, appetite change, fatigue and unexpected weight change.  HENT: Negative for mouth sores and neck stiffness.   Eyes: Negative for visual disturbance.  Respiratory: Positive for cough and shortness of breath. Negative for chest tightness and  wheezing.   Cardiovascular: Positive for chest pain.  Gastrointestinal: Negative for nausea, vomiting, abdominal pain, diarrhea and constipation.  Endocrine: Negative for polydipsia, polyphagia and polyuria.  Genitourinary: Negative for dysuria, urgency, frequency and hematuria.  Musculoskeletal: Negative for back pain.  Skin: Negative for rash.  Allergic/Immunologic: Negative for immunocompromised state.  Neurological: Negative for syncope, light-headedness and headaches.  Hematological: Does not bruise/bleed easily.  Psychiatric/Behavioral: Negative for sleep disturbance. The patient is not nervous/anxious.   All other systems reviewed and are negative.    Allergies  Review of patient's allergies indicates no known allergies.  Home Medications   Current Outpatient Rx  Name  Route  Sig  Dispense  Refill  . levothyroxine (SYNTHROID, LEVOTHROID) 150 MCG tablet      TAKE 1 TABLET BY MOUTH DAILY   30 tablet   2   . LORazepam (ATIVAN) 1 MG tablet      Take one tablet BID  prn anxiety   30 tablet   2     Requested by Hospice.  Called in to [facility]   . metoCLOPramide (REGLAN) 10 MG tablet      TAKE 1 TABLET BY MOUTH FOUR TIMES DAILY   60 tablet   0   . ondansetron (ZOFRAN ODT) 4 MG disintegrating tablet   Oral   Take 1 tablet (4  mg total) by mouth every 8 (eight) hours as needed for nausea.   20 tablet   0   . predniSONE (DELTASONE) 5 MG tablet   Oral   Take 1 tablet (5 mg total) by mouth daily.   30 tablet   1   . promethazine (PHENERGAN) 25 MG tablet   Oral   Take 1 tablet (25 mg total) by mouth every 6 (six) hours as needed. Nausea   30 tablet   1   . venlafaxine XR (EFFEXOR-XR) 75 MG 24 hr capsule      Take 2 tabs daily   60 capsule   0   . warfarin (COUMADIN) 5 MG tablet      Pt to take 1/2 tablet ( 2.5mg  ) daily Please cut pills in half for this pt   10 tablet   1   . albuterol (PROVENTIL HFA;VENTOLIN HFA) 108 (90 BASE) MCG/ACT inhaler    Inhalation   Inhale 1-2 puffs into the lungs every 6 (six) hours as needed for wheezing.   1 Inhaler   0   . albuterol (PROVENTIL) (2.5 MG/3ML) 0.083% nebulizer solution      USE 1 VIAL VIA NEBULIZER EVERY 6 HOURS AS NEEDED FOR WHEEZING OR SHORTNESS OF BREATH   75 mL   0   . anastrozole (ARIMIDEX) 1 MG tablet      TAKE 1 TABLET BY MOUTH DAILY   30 tablet   2   . cholestyramine (QUESTRAN) 4 G packet      1 packet by mouth with food twice daily as needed for diarrhea   60 each   12   . docusate sodium (COLACE) 100 MG capsule   Oral   Take 100 mg by mouth daily.         Marland Kitchen enoxaparin (LOVENOX) 60 MG/0.6ML injection   Subcutaneous   Inject 0.6 mLs (60 mg total) into the skin 2 (two) times daily.   30 Syringe   3   . fentaNYL (DURAGESIC - DOSED MCG/HR) 50 MCG/HR   Transdermal   Place 1 patch (50 mcg total) onto the skin every 3 (three) days.   5 patch   0     [facility]   . guaiFENesin-dextromethorphan (ROBITUSSIN DM) 100-10 MG/5ML syrup   Oral   Take 5 mLs by mouth every 4 (four) hours as needed for cough.   118 mL   0   . HYDROmorphone (DILAUDID) 4 MG tablet   Oral   Take 1 tablet (4 mg total) by mouth every 4 (four) hours as needed for pain. For pain   60 tablet   0   . MUCINEX MAXIMUM STRENGTH 1200 MG TB12      TAKE 1 TABLET BY MOUTH TWICE DAILY FOR RESPIRATORY CONGESTION   60 tablet   0     HOSPICE PATIENT   . PRESCRIPTION MEDICATION   Intravenous   Inject 4 mg into the vein. Zometa every 2 months           BP 158/87  Pulse 111  Temp(Src) 98.2 F (36.8 C) (Oral)  Resp 20  SpO2 100%  Physical Exam  Nursing note and vitals reviewed. Constitutional: She appears well-developed. She appears lethargic. No distress.  Ill-appearing  HENT:  Head: Normocephalic and atraumatic.  Right Ear: Tympanic membrane, external ear and ear canal normal.  Left Ear: Tympanic membrane, external ear and ear canal normal.  Nose: Nose normal.   Mouth/Throat: Uvula is midline  and oropharynx is clear and moist. Mucous membranes are dry. No oropharyngeal exudate, posterior oropharyngeal edema, posterior oropharyngeal erythema or tonsillar abscesses.  Eyes: Conjunctivae are normal. Pupils are equal, round, and reactive to light. No scleral icterus.  Neck: Normal range of motion. Neck supple.  Cardiovascular: Regular rhythm, normal heart sounds and intact distal pulses.  Tachycardia present.   Pulses:      Radial pulses are 2+ on the right side, and 2+ on the left side.       Dorsalis pedis pulses are 2+ on the right side, and 2+ on the left side.       Posterior tibial pulses are 2+ on the right side, and 2+ on the left side.  Pulmonary/Chest: Accessory muscle usage present. Tachypnea noted. She is in respiratory distress. She has decreased breath sounds. She has wheezes. She has rhonchi. She has rales.  Abdominal: Soft. Bowel sounds are normal. She exhibits distension. She exhibits no mass. There is generalized tenderness (mild). There is no rigidity, no rebound and no guarding.  Musculoskeletal: Normal range of motion. She exhibits no edema.  Lymphadenopathy:    She has no cervical adenopathy.  Neurological: She appears lethargic. GCS eye subscore is 3. GCS verbal subscore is 4. GCS motor subscore is 5.  Speech is clear and goal oriented Moves extremities without ataxia  Skin: Skin is warm. No rash noted. She is diaphoretic. No erythema.  Psychiatric: She has a normal mood and affect.    ED Course  Procedures (including critical care time)  Labs Reviewed  CULTURE, BLOOD (ROUTINE X 2)  CULTURE, BLOOD (ROUTINE X 2)  COMPREHENSIVE METABOLIC PANEL  MAGNESIUM  CBC  APTT  PROTIME-INR  TSH  URINALYSIS, ROUTINE W REFLEX MICROSCOPIC  LACTIC ACID, PLASMA   No results found.  ECG:  Date: 10/02/2012  Rate: 108  Rhythm: sinus tachycardia  QRS Axis: normal  Intervals: normal  ST/T Wave abnormalities: normal  Conduction  Disutrbances:none  Narrative Interpretation: sinus tach, nonischemic ECG  Old EKG Reviewed: unchanged    1. Breast cancer metastasized to multiple sites, right   2. Anemia   3. Anxiety   4. Acute respiratory failure   5. Lymphangitic lung metastasis, unspecified laterality     CRITICAL CARE Performed by: Dierdre Forth   Total critical care time:  Critical care time was exclusive of separately billable procedures and treating other patients.  Critical care was necessary to treat or prevent imminent or life-threatening deterioration.  Critical care was time spent personally by me on the following activities: development of treatment plan with patient and/or surrogate as well as nursing, discussions with consultants, evaluation of patient's response to treatment, examination of patient, obtaining history from patient or surrogate, ordering and performing treatments and interventions, ordering and review of laboratory studies, ordering and review of radiographic studies, pulse oximetry and re-evaluation of patient's condition.   MDM  Rolla Etienne presents with SOB, resp distress, hypoxia.  Concern for sepsis vs progression/sequela of her metastatic breast cancer.  Will proceed with sepsis work-up and Oncology will admit.    UA the evidence of urinary tract infection, patient coagulopathic with an INR of 4.17 CMP unremarkable, lactic acid 1.1, CBC with mild anemia at 10.  Chest x-ray with Increased bilateral pleural effusions with presumed bibasilar atelectasis, although pneumonia could be obscured.         Dahlia Client Letzy Gullickson, PA-C 10/02/12 2057

## 2012-10-03 ENCOUNTER — Inpatient Hospital Stay (HOSPITAL_COMMUNITY)

## 2012-10-03 MED ORDER — SODIUM CHLORIDE 0.9 % IV SOLN
2.0000 mg/h | INTRAVENOUS | Status: DC
  Administered 2012-10-03 – 2012-10-04 (×4): 2 mg/h via INTRAVENOUS
  Administered 2012-10-05: 5 mg/h via INTRAVENOUS
  Filled 2012-10-03 (×3): qty 10

## 2012-10-03 MED ORDER — MORPHINE BOLUS VIA INFUSION
2.0000 mg | INTRAVENOUS | Status: DC | PRN
  Administered 2012-10-03 – 2012-10-05 (×10): 2 mg via INTRAVENOUS
  Filled 2012-10-03: qty 2

## 2012-10-03 NOTE — Progress Notes (Signed)
Pt alert and oriented sitting up in bed.  Pt does not remember yesterday.  Pt wants to return home as soon as possible. Mother and one of her sisters present. Will continue to follow during hospitalization. [place],LCSW

## 2012-10-03 NOTE — Progress Notes (Signed)
Kristin [name]   DOB:[birth year]   ZO#:[id number]   [phone]  Subjective: last night was "not good," "having trouble breathing." She has had a little cough, bringing up "saliva," but "I just can't breathe."   Objective: middle aged african american woman examined in bed Filed Vitals:   10/03/12 0449  BP: 141/88  Pulse: 89  Temp: 98 F (36.7 C)  Resp: 20    Body mass index is 18.31 kg/(m^2).  Intake/Output Summary (Last 24 hours) at 10/03/12 0755 Last data filed at 10/03/12 0300  Gross per 24 hour  Intake    240 ml  Output    600 ml  Net   -360 ml     Oropharynx clear, dry  No peripheral adenopathy  Lungs show coarse wheezes and rales bilaterally, decreased excursion  Heart regular rate and rhythm  Abdomen benign  MSK no focal spinal tenderness, grade 1 bilateral ankle edema  Neuro nonfocal  Breast exam: deferred  CBG (last 3)  No results found for this basename: GLUCAP,  in the last 72 hours   Labs:  Lab Results  Component Value Date   WBC 8.6 10/02/2012   HGB 10.0* 10/02/2012   HCT 31.3* 10/02/2012   MCV 79.0 10/02/2012   PLT 397 10/02/2012   NEUTROABS 5.2 09/07/2012    @LASTCHEMISTRY @  Urine Studies No results found for this basename: UACOL, UAPR, USPG, UPH, UTP, UGL, UKET, UBIL, UHGB, UNIT, UROB, ULEU, UEPI, UWBC, URBC, UBAC, CAST, CRYS, UCOM, BILUA,  in the last 72 hours  Basic Metabolic Panel:  Recent Labs Lab 10/02/12 1431  NA 137  K 3.8  CL 99  CO2 32  GLUCOSE 171*  BUN 10  CREATININE 0.98  CALCIUM 8.1*  MG 2.2   GFR Estimated Creatinine Clearance: 72.5 ml/min (by C-G formula based on Cr of 0.98). Liver Function Tests:  Recent Labs Lab 10/02/12 1431  AST 18  ALT 9  ALKPHOS 269*  BILITOT 0.4  PROT 6.7  ALBUMIN 2.4*   No results found for this basename: LIPASE, AMYLASE,  in the last 168 hours No results found for this basename: AMMONIA,  in the last 168 hours Coagulation profile  Recent Labs Lab 10/02/12 1431  INR 4.17*     CBC:  Recent Labs Lab 10/02/12 1431  WBC 8.6  HGB 10.0*  HCT 31.3*  MCV 79.0  PLT 397   Cardiac Enzymes: No results found for this basename: CKTOTAL, CKMB, CKMBINDEX, TROPONINI,  in the last 168 hours BNP: No components found with this basename: POCBNP,  CBG: No results found for this basename: GLUCAP,  in the last 168 hours D-Dimer No results found for this basename: DDIMER,  in the last 72 hours Hgb A1c No results found for this basename: HGBA1C,  in the last 72 hours Lipid Profile No results found for this basename: CHOL, HDL, LDLCALC, TRIG, CHOLHDL, LDLDIRECT,  in the last 72 hours Thyroid function studies  Recent Labs  10/02/12 1431  TSH 11.252*   Anemia work up No results found for this basename: VITAMINB12, FOLATE, FERRITIN, TIBC, IRON, RETICCTPCT,  in the last 72 hours Microbiology No results found for this or any previous visit (from the past 240 hour(s)).    Studies:  X-ray Chest Pa And Lateral   10/02/2012  *RADIOLOGY REPORT*  Clinical Data: Shortness of breath, history of breast cancer, confusion  CHEST - 2 VIEW  Comparison: 09/07/2012  Findings: Cardiac leads obscure detail.  Lung volumes are extremely low, with crowding of the  bronchovascular markings at the bases. Chronic upper retraction of the hila with biapical volume loss again noted, but with increased apical volume loss, particularly the right upper lobe with.  Increased biapical pleural thickening/fluid and increased bilateral small pleural effusions obscuring lung bases.  Irregular sclerosis of the right glenohumeral joint is stable.  Heart size is not well assessed due to overlying soft tissue structures but is grossly within normal limits.  No pneumothorax.  IMPRESSION: Increased bilateral pleural effusions with presumed bibasilar atelectasis, although pneumonia could be obscured.  Increased bilateral upper lobe volume loss.   Original Report Authenticated By: Kristin [name], M.D.      Assessment: 47 y.o. [place] woman with history of breast cancer stage IV at diagnosis February 2011, with metastases to bone, lungs and liver, now admitted with worsening SOB likely due to lymphangitic spread of tumor  (1) status post 30 Gy to T-3 through T-18 October 2009  (2) status post docetaxel, cyclophosphamide and doxorubicin x6, completed in June 2011.  (3) status post Left lumpectomy and sentinel lymph node biopsy August 2011 for a 2-mm focus of residual invasive ductal carcinoma, grade 2, negative sentinel lymph node  (4) tamoxifen started July 2011, with evidence of progression March 2013  (5) status post radiation to Left breast completed November 2011  (6) s/p radiation to the lower spine, completed 11/15/2011  (7) fulvestrant/goserelin started 11/03/2011, with evidence of progression in September 2013  (8) status post palliative radiation to the right hip in September 2013  (9) Continuing goserelin and adding anastrazole, beginning 05/09/2012  (10) continuing on zolendronic acid every other month   Plan: Kristin [name] is slightly more alert today; I discussed transfer to the palliative care unit and she agreed (shrugged) but I am not sure she understands. Will obtain a repeat CXR and consider thoracentesis. Will consider morphine drip if breathing does not improve with current measures. DNR in place   [facility] C 10/03/2012

## 2012-10-03 NOTE — Care Management (Signed)
CARE MANAGE MENT UTILIZATION REVIEW NOTE 10/03/2012     Patient:  Kristin [name], Kristin [name]   Account Number:  [id number]  Documented by:  Roxy Manns Zenaida Tesar   Per Ur Regulation Reviewed for med. necessity/level of care/duration of stay

## 2012-10-03 NOTE — Progress Notes (Signed)
Room 1302 - Kristin [name] - [facility] RN Visit-R.Roshon Duell RN  Related admission to [facility] diagnosis of metastatic breast cancer.  Pt is DNI code.    Pt alert & oriented, lying in bed, states she does not remember yesterday and her trip here to the hospital and ED.  Pt bright, eating breakfast, requesting more juice,  without complaints of breathing difficulty but does admit to continued discomfort in her LLE with continued swelling.    No family present. Patient's home medication list is on shadow chart.   Please call [facility] @ [phone]- ask for RN Liaison or after hours,ask for on-call RN with any hospice needs.    Thank you.  [name], RN  [facility]  Hospice Liaison

## 2012-10-04 MED ORDER — PANTOPRAZOLE SODIUM 40 MG PO TBEC
40.0000 mg | DELAYED_RELEASE_TABLET | Freq: Every day | ORAL | Status: DC
  Administered 2012-10-04 – 2012-10-05 (×2): 40 mg via ORAL
  Filled 2012-10-04 (×4): qty 1

## 2012-10-04 MED ORDER — ALBUTEROL SULFATE (5 MG/ML) 0.5% IN NEBU
2.5000 mg | INHALATION_SOLUTION | RESPIRATORY_TRACT | Status: DC | PRN
  Administered 2012-10-04: 2.5 mg via RESPIRATORY_TRACT
  Filled 2012-10-04: qty 0.5

## 2012-10-04 MED ORDER — IPRATROPIUM BROMIDE 0.02 % IN SOLN
0.5000 mg | RESPIRATORY_TRACT | Status: DC | PRN
  Administered 2012-10-04: 0.5 mg via RESPIRATORY_TRACT
  Filled 2012-10-04: qty 2.5

## 2012-10-04 NOTE — Progress Notes (Addendum)
Room 1325 - Rolla Etienne - [facility] RN Visit-R.Murel Wigle RN  Related admission to [facility] diagnosis of metastatic breast cancer.  Pt is DNR code.    Pt arousable and states pain is under control, pt appears comfortable.  No family present.  Discussed with staff RN Celine Mans- MS drip started last pm due to hourly needs for pain meds.  Per staff RN, pt's lung sounds are very congested with extended expiratory efforts.  Patient's home medication list is on shadow chart.   Please call [facility] @ [phone]- ask for RN Liaison or after hours,ask for on-call RN with any hospice needs.    Thank you.  [name], RN  [facility]  Hospice Liaison

## 2012-10-04 NOTE — ED Provider Notes (Signed)
Medical screening examination/treatment/procedure(s) were conducted as a shared visit with non-physician practitioner(s) and myself.  I personally evaluated the patient during the encounter  Kristin [name] is a 47 y.o. female hx of metastatic breast cancer here with SOB, hypoxia. Concerned for possible pneumonia. Sepsis workup initiated in the ED. + UTI and pneumonia. Patient given abx and admitted to oncology.    Richardean Canal, MD 10/04/12 1455

## 2012-10-04 NOTE — Progress Notes (Signed)
This patient is receiving IV Protonix. Based on criteria approved by the Pharmacy and Therapeutics Committee, this medication is being converted to the equivalent oral dose form. These criteria include:   . The patient is eating (either orally or per tube) and/or has been taking other orally administered medications for at least 24 hours.  . This patient has no evidence of active gastrointestinal bleeding or impaired GI absorption (gastrectomy, short bowel, patient on TNA or NPO).   If you have questions about this conversion, please contact the pharmacy department.  Kristin [name], [place] 10/04/2012 1:13 PM

## 2012-10-05 NOTE — Progress Notes (Signed)
Kristin [name]   DOB:[birth year]   ZO#:[id number]   JWJ#:[id number]  Subjective: Susan looks considerably better today. She is sitting on the side of the bed about to have her breakfast. She is much more alert. She tells me her pain is well-controlled. She denies headaches, visual changes, nausea, vomiting, or change in bowel or bladder habits. She does say that her breathing is still "not very good".  Objective: middle aged african american woman examined at bedside Filed Vitals:   10/05/12 0543  BP: 140/82  Pulse: 84  Temp: 97.4 F (36.3 C)  Resp: 16    Body mass index is 18.31 kg/(m^2).  Intake/Output Summary (Last 24 hours) at 10/05/12 6213 Last data filed at 10/05/12 0865  Gross per 24 hour  Intake    200 ml  Output   1250 ml  Net  -1050 ml     Oropharynx clear  No peripheral adenopathy  Lungs show coarse rales bilaterally, decreased excursion  Heart regular rate and rhythm  Abdomen benign  MSK no focal spinal tenderness, grade 1 bilateral ankle edema  Neuro nonfocal, pleasant affect  Breast exam: deferred  CBG (last 3)  No results found for this basename: GLUCAP,  in the last 72 hours   Labs:  Lab Results  Component Value Date   WBC 8.6 10/02/2012   HGB 10.0* 10/02/2012   HCT 31.3* 10/02/2012   MCV 79.0 10/02/2012   PLT 397 10/02/2012   NEUTROABS 5.2 09/07/2012    @LASTCHEMISTRY @  Urine Studies No results found for this basename: UACOL, UAPR, USPG, UPH, UTP, UGL, UKET, UBIL, UHGB, UNIT, UROB, ULEU, UEPI, UWBC, URBC, UBAC, CAST, CRYS, UCOM, BILUA,  in the last 72 hours  Basic Metabolic Panel:  Recent Labs Lab 10/02/12 1431  NA 137  K 3.8  CL 99  CO2 32  GLUCOSE 171*  BUN 10  CREATININE 0.98  CALCIUM 8.1*  MG 2.2   GFR Estimated Creatinine Clearance: 72.5 ml/min (by C-G formula based on Cr of 0.98). Liver Function Tests:  Recent Labs Lab 10/02/12 1431  AST 18  ALT 9  ALKPHOS 269*  BILITOT 0.4  PROT 6.7  ALBUMIN 2.4*   No results found for  this basename: LIPASE, AMYLASE,  in the last 168 hours No results found for this basename: AMMONIA,  in the last 168 hours Coagulation profile  Recent Labs Lab 10/02/12 1431  INR 4.17*    CBC:  Recent Labs Lab 10/02/12 1431  WBC 8.6  HGB 10.0*  HCT 31.3*  MCV 79.0  PLT 397   Cardiac Enzymes: No results found for this basename: CKTOTAL, CKMB, CKMBINDEX, TROPONINI,  in the last 168 hours BNP: No components found with this basename: POCBNP,  CBG: No results found for this basename: GLUCAP,  in the last 168 hours D-Dimer No results found for this basename: DDIMER,  in the last 72 hours Hgb A1c No results found for this basename: HGBA1C,  in the last 72 hours Lipid Profile No results found for this basename: CHOL, HDL, LDLCALC, TRIG, CHOLHDL, LDLDIRECT,  in the last 72 hours Thyroid function studies  Recent Labs  10/02/12 1431  TSH 11.252*   Anemia work up No results found for this basename: VITAMINB12, FOLATE, FERRITIN, TIBC, IRON, RETICCTPCT,  in the last 72 hours Microbiology Recent Results (from the past 240 hour(s))  CULTURE, BLOOD (ROUTINE X 2)     Status: None   Collection Time    10/02/12  2:11 PM  Result Value Range Status   Specimen Description BLOOD RIGHT ARM   Final   Special Requests BOTTLES DRAWN AEROBIC AND ANAEROBIC 5CC   Final   Culture  Setup Time 10/02/2012 22:16   Final   Culture     Final   Value:        BLOOD CULTURE RECEIVED NO GROWTH TO DATE CULTURE WILL BE HELD FOR 5 DAYS BEFORE ISSUING A FINAL NEGATIVE REPORT   Report Status PENDING   Incomplete  CULTURE, BLOOD (ROUTINE X 2)     Status: None   Collection Time    10/02/12  2:41 PM      Result Value Range Status   Specimen Description BLOOD RIGHT HAND   Final   Special Requests BOTTLES DRAWN AEROBIC AND ANAEROBIC 8CC   Final   Culture  Setup Time 10/02/2012 22:16   Final   Culture     Final   Value:        BLOOD CULTURE RECEIVED NO GROWTH TO DATE CULTURE WILL BE HELD FOR 5 DAYS  BEFORE ISSUING A FINAL NEGATIVE REPORT   Report Status PENDING   Incomplete      Studies:  No results found.  Assessment: 47 y.o. [place] woman with history of breast cancer stage IV at diagnosis February 2011, with metastases to bone, lungs and liver, now admitted with worsening SOB likely due to lymphangitic spread of tumor  (1) status post 30 Gy to T-3 through T-18 October 2009  (2) status post docetaxel, cyclophosphamide and doxorubicin x6, completed in June 2011.  (3) status post Left lumpectomy and sentinel lymph node biopsy August 2011 for a 2-mm focus of residual invasive ductal carcinoma, grade 2, negative sentinel lymph node  (4) tamoxifen started July 2011, with evidence of progression March 2013  (5) status post radiation to Left breast completed November 2011  (6) s/p radiation to the lower spine, completed 11/15/2011  (7) fulvestrant/goserelin started 11/03/2011, with evidence of progression in September 2013  (8) status post palliative radiation to the right hip in September 2013  (9) On goserelin and adding anastrazole, beginning 05/09/2012; most recent goserelin dose 07/10/2012 (10) continuing on zolendronic acid every other month, most recent dose 09/22/2012   Plan: Michole  has rallied and we need to start thinking about discharge. Her survival is likely to be brief--weeks rather than months. I suggested [employer] and her first response was negative. She wants to go home. However she needs 24/7 care and will be on a morphine drip--I think if she went home she will be back in the hospital in 3 days or less. I am asking the palliative team to help the patient and family understand the situation and options. If they agree to [facility] I will be glad to discharge when a bed opens up.  MAGRINAT,GUSTAV C 10/05/2012

## 2012-10-05 NOTE — Progress Notes (Addendum)
Room 1325 - Kristin [name] - [facility] RN Visit-R.Aminat Shelburne RN  Related admission to [facility] diagnosis of metastatic breast cancer.  Pt is DNR code.  Pt alert & oriented, sitting up on side of bed, with complaints of pain in the LLE with some edema present.    No family present.   Pt states Dr. Darnelle Catalan suggested she be discharged to [facility] and pt states that is not what she wants - she wants to go home.  Per EPIC note, pt will go home on MS drip and will need 24/7 care-pt's mother and sister live with her and assist in providing 24/7 care. Dr. Darrall Dears note faxed to [facility] and [facility] SW Beth called to notify of pending discharge.  PMT may be called in to discuss with pt.   Please call [facility] @ [phone]- ask for RN Liaison or after hours,ask for on-call RN with any hospice needs.    Thank you.  [name], RN  [facility]  Hospice Liaison

## 2012-10-05 NOTE — Progress Notes (Signed)
Pt wants to return home.  Pt's mother, Jamesetta So, her sisters, Dois Davenport and Elita Quick as well as her son are willing to provide care for pt at home.  Spoke with mother about prognosis and she is ok with pt dying at home.  Since pt is now a DNR, hospice may be able to assist family in keeping her at home.  The issue of pt returning to the hospital has been one of code status more so than family's desire for her to come to the hospital. I spoke with Garth Bigness at [facility] and she says that there are no beds available at [facility] at this time anyway.  Wynonia Hazard, LCSW

## 2012-10-06 ENCOUNTER — Other Ambulatory Visit: Payer: Self-pay | Admitting: Oncology

## 2012-10-06 ENCOUNTER — Inpatient Hospital Stay (HOSPITAL_COMMUNITY)

## 2012-10-06 MED ORDER — ANASTROZOLE 1 MG PO TABS: ORAL_TABLET | ORAL | Status: DC

## 2012-10-06 MED ORDER — MORPHINE SULFATE 2 MG/ML IJ SOLN
2.0000 mg | Freq: Once | INTRAMUSCULAR | Status: AC
  Administered 2012-10-06: 2 mg via INTRAVENOUS

## 2012-10-06 MED ORDER — SODIUM CHLORIDE 0.9 % IJ SOLN: 10.0000 mL | INTRAMUSCULAR | Status: DC | PRN

## 2012-10-06 MED ORDER — SODIUM CHLORIDE 0.9 % IV SOLN: 4.0000 mg/h | INTRAVENOUS | Status: DC

## 2012-10-06 MED ORDER — SODIUM CHLORIDE 0.9 % IJ SOLN: 10.0000 mL | Freq: Two times a day (BID) | INTRAMUSCULAR | Status: DC

## 2012-10-06 MED ORDER — MORPHINE BOLUS VIA INFUSION: 2.0000 mg | INTRAVENOUS | Status: DC | PRN

## 2012-10-06 MED ORDER — PREDNISONE (PAK) 10 MG PO TABS: ORAL_TABLET | ORAL | Status: DC

## 2012-10-06 NOTE — Progress Notes (Signed)
MD ordered foley cath to be placed.  Patient refused stating that she does not feel that she needs foley at this time.  Notified MD.  Patient to discharge home with Hospice.  Hospice informed regarding foley and will place in the home at a later date if indicated.

## 2012-10-06 NOTE — Progress Notes (Signed)
Spoke with Ronnell Guadalajara via telephone to obtain consent for pt having PICC line placed today as pt is confused.  PICC line is needed to continue morphine gtt at home.  Blanchie Serve, RN also spoke with Jamesetta So who again gave consent for PICC line to be placed.

## 2012-10-06 NOTE — Discharge Summary (Signed)
Physician Discharge Summary  Patient ID: Kristin [name] MRN: [id number] DOB/AGE: 48-Aug-1967 48 y.o.  Admit date: 10/02/2012 Discharge date: 10/06/2012  Primary Care Physician:  [name], MD   Discharge Diagnoses:    Present on Admission:  . Acute respiratory failure . Anemia . Anxiety . Breast cancer metastasized to multiple sites . Chronic pain . Lymphangitic lung metastasis  Discharge Medications:    Medication List    STOP taking these medications       cholestyramine 4 G packet  Commonly known as:  QUESTRAN     diclofenac sodium 1 % Gel  Commonly known as:  VOLTAREN     fentaNYL 50 MCG/HR  Commonly known as:  DURAGESIC - dosed mcg/hr     Guaifenesin 1200 MG Tb12     guaiFENesin-dextromethorphan 100-10 MG/5ML syrup  Commonly known as:  ROBITUSSIN DM     HYDROmorphone 4 MG tablet  Commonly known as:  DILAUDID     predniSONE 5 MG tablet  Commonly known as:  DELTASONE     PRESCRIPTION MEDICATION     warfarin 5 MG tablet  Commonly known as:  COUMADIN      TAKE these medications       albuterol 108 (90 BASE) MCG/ACT inhaler  Commonly known as:  PROVENTIL HFA;VENTOLIN HFA  Inhale 1-2 puffs into the lungs every 6 (six) hours as needed for wheezing.     albuterol (2.5 MG/3ML) 0.083% nebulizer solution  Commonly known as:  PROVENTIL  Take 2.5 mg by nebulization every 6 (six) hours as needed for wheezing.     anastrozole 1 MG tablet  Commonly known as:  ARIMIDEX  TAKE 1 TABLET BY MOUTH DAILY     anastrozole 1 MG tablet  Commonly known as:  ARIMIDEX  Take 1 mg by mouth daily.     bisacodyl 10 MG suppository  Commonly known as:  DULCOLAX  Place 10 mg rectally as needed for constipation.     docusate sodium 100 MG capsule  Commonly known as:  COLACE  Take 100 mg by mouth daily.     furosemide 40 MG tablet  Commonly known as:  LASIX  Take 40 mg by mouth daily.     levothyroxine 150 MCG tablet  Commonly known as:  SYNTHROID,  LEVOTHROID  Take 150 mcg by mouth daily.     LORazepam 1 MG tablet  Commonly known as:  ATIVAN  Take 1 mg by mouth 2 (two) times daily as needed for anxiety.     metoCLOPramide 10 MG tablet  Commonly known as:  REGLAN  Take 10 mg by mouth 4 (four) times daily.     morphine 5 mg/mL Soln  Inject 2 mg into the vein every hour as needed (dyspnea).     omeprazole 20 MG capsule  Commonly known as:  PRILOSEC  Take 40 mg by mouth 2 (two) times daily.     ondansetron 4 MG disintegrating tablet  Commonly known as:  ZOFRAN ODT  Take 1 tablet (4 mg total) by mouth every 8 (eight) hours as needed for nausea.     predniSONE 10 MG tablet  Commonly known as:  STERAPRED UNI-PAK  Take one tablet daily in AM     promethazine 25 MG tablet  Commonly known as:  PHENERGAN  Take 25 mg by mouth every 6 (six) hours as needed for nausea.     sodium chloride 0.9 % SOLN 90 mL with morphine 10 MG/ML SOLN 100 mg  Inject 4  mg/hr into the vein continuous.     venlafaxine XR 75 MG 24 hr capsule  Commonly known as:  EFFEXOR-XR  Take 150 mg by mouth daily.         Disposition and Follow-up: home with Hospice support  Significant Diagnostic Studies:  Dg Chest 2 View  10/03/2012  *RADIOLOGY REPORT*  Clinical Data: Shortness of breath, history breast cancer  CHEST - 2 VIEW  Comparison: October 02, 2012  Findings: There is volume loss with consolidation / scarring of bilateral upper lobes.  The right upper lobe is slightly better aerated compared prior exam.  There is patchy consolidation of left lung base with left pleural effusion unchanged.  There is a small posterior right pleural effusion.  There is increased pulmonary interstitium in the central lungs, could be due to pulmonary edema. The mediastinal contour and cardiac silhouette are stable.  There is abnormal irregular sclerosis of the right glenoid, unchanged.  IMPRESSION: Persistent consolidation of left lung base with left pleural effusion  unchanged. Small right pleural effusion.  There is consolidation with scarring of bilateral upper lobes; the right upper lobe is slightly better aerated compared to prior exam.   Original Report Authenticated By: Sherian Rein, M.D.    X-ray Chest Pa And Lateral   10/02/2012  *RADIOLOGY REPORT*  Clinical Data: Shortness of breath, history of breast cancer, confusion  CHEST - 2 VIEW  Comparison: 09/07/2012  Findings: Cardiac leads obscure detail.  Lung volumes are extremely low, with crowding of the bronchovascular markings at the bases. Chronic upper retraction of the hila with biapical volume loss again noted, but with increased apical volume loss, particularly the right upper lobe with.  Increased biapical pleural thickening/fluid and increased bilateral small pleural effusions obscuring lung bases.  Irregular sclerosis of the right glenohumeral joint is stable.  Heart size is not well assessed due to overlying soft tissue structures but is grossly within normal limits.  No pneumothorax.  IMPRESSION: Increased bilateral pleural effusions with presumed bibasilar atelectasis, although pneumonia could be obscured.  Increased bilateral upper lobe volume loss.   Original Report Authenticated By: Christiana Pellant, M.D.    Dg Chest 2 View  09/07/2012  *RADIOLOGY REPORT*  Clinical Data: Cough and chest congestion and wheezing.  Metastatic breast cancer.  CHEST - 2 VIEW  Comparison: 12/27, 12/23, and 05/23/2012  Findings: The patient has slight increase in the left pleural effusion.  Chronic consolidation in the left lung apex is unchanged.  Slightly improved aeration in the right upper lobe.  Slight increased infiltrate in the right lower lobe.  Right hilar mass and mediastinal adenopathy and lymphangitic tumor are felt to exist in the right lung.  Small right pleural effusion.  Multiple osseous metastases with pathologic fractures in the spine as previously demonstrated.  IMPRESSION:  1.  Slight increase in the left  pleural effusion. 2.  Increased disease at the right lung base.  Slightly improved aeration in the right upper lobe. 3. Increased right perihilar tumor.   Original Report Authenticated By: Francene Boyers, M.D.     Discharge Laboratory Values: Basic Metabolic Panel:  Recent Labs Lab 10/02/12 1431  NA 137  K 3.8  CL 99  CO2 32  GLUCOSE 171*  BUN 10  CREATININE 0.98  CALCIUM 8.1*  MG 2.2   GFR Estimated Creatinine Clearance: 72.5 ml/min (by C-G formula based on Cr of 0.98). Liver Function Tests:  Recent Labs Lab 10/02/12 1431  AST 18  ALT 9  ALKPHOS 269*  BILITOT 0.4  PROT 6.7  ALBUMIN 2.4*   No results found for this basename: LIPASE, AMYLASE,  in the last 168 hours No results found for this basename: AMMONIA,  in the last 168 hours Coagulation profile  Recent Labs Lab 10/02/12 1431  INR 4.17*    CBC:  Recent Labs Lab 10/02/12 1431  WBC 8.6  HGB 10.0*  HCT 31.3*  MCV 79.0  PLT 397   Cardiac Enzymes: No results found for this basename: CKTOTAL, CKMB, CKMBINDEX, TROPONINI,  in the last 168 hours BNP: No components found with this basename: POCBNP,  CBG: No results found for this basename: GLUCAP,  in the last 168 hours D-Dimer No results found for this basename: DDIMER,  in the last 72 hours Hgb A1c No results found for this basename: HGBA1C,  in the last 72 hours Lipid Profile No results found for this basename: CHOL, HDL, LDLCALC, TRIG, CHOLHDL, LDLDIRECT,  in the last 72 hours Thyroid function studies No results found for this basename: TSH, T4TOTAL, FREET3, T3FREE, THYROIDAB,  in the last 72 hours Anemia work up No results found for this basename: VITAMINB12, FOLATE, FERRITIN, TIBC, IRON, RETICCTPCT,  in the last 72 hours Microbiology Recent Results (from the past 240 hour(s))  CULTURE, BLOOD (ROUTINE X 2)     Status: None   Collection Time    10/02/12  2:11 PM      Result Value Range Status   Specimen Description BLOOD RIGHT ARM   Final    Special Requests BOTTLES DRAWN AEROBIC AND ANAEROBIC 5CC   Final   Culture  Setup Time 10/02/2012 22:16   Final   Culture     Final   Value:        BLOOD CULTURE RECEIVED NO GROWTH TO DATE CULTURE WILL BE HELD FOR 5 DAYS BEFORE ISSUING A FINAL NEGATIVE REPORT   Report Status PENDING   Incomplete  CULTURE, BLOOD (ROUTINE X 2)     Status: None   Collection Time    10/02/12  2:41 PM      Result Value Range Status   Specimen Description BLOOD RIGHT HAND   Final   Special Requests BOTTLES DRAWN AEROBIC AND ANAEROBIC 8CC   Final   Culture  Setup Time 10/02/2012 22:16   Final   Culture     Final   Value:        BLOOD CULTURE RECEIVED NO GROWTH TO DATE CULTURE WILL BE HELD FOR 5 DAYS BEFORE ISSUING A FINAL NEGATIVE REPORT   Report Status PENDING   Incomplete     Brief H and P: For complete details please refer to admission H and P, but in brief, the patient was admitted nearly moribund but rallied with intensive pulmonary support. See hospital course for details  Physical Exam at Discharge: BP 160/96  Pulse 72  Temp(Src) 97.4 F (36.3 C) (Axillary)  Resp 20  Ht 6\' 2"  (1.88 m)  Wt 142 lb 10.2 oz (64.7 kg)  BMI 18.31 kg/m2  SpO2 100% Gen: middle aged [place] American woman examined in bed Cardiovascular:RRR Respiratory: diffuse rales bilaterally, decreased as compared w admission; poor excursion bilaterally Gastrointestinal:nontender, +BS Extremities: minimal ankle edema    Hospital Course:   The patient was admitted lethargic and with severe respiratory distress. Family was advised patient seemed likely not to survive admission and I discussed advanced directives with the patient's mother, Jamesetta So, who is her HCPOA. Jamesetta So understood the situation well and agreed to a DNR order. We then  did our best to support the patient's pulmonary function while keeping her comfortable. We started her on a morphine pump, and added steroids, diuretics, breathing treatments, an antibiotic  (prophylactically) and of course oxygen. Over the next two days Lucette's breathing became less labored and she became more alert.   I explained to the patient and family that [facility] will require 24/7 coverage and that she is likely to die in the next few days or perhaps a few weeks. I offered [facility]. Family has never visited that facility and it seems they had a very negative preconception. I described the facility and urged them to visit. However they prefer to take [place] home. I spoke with Jamesetta So again this morning to make sure the family understood Huxley was not likely to survive long and that it was acceptable for them for her to die at home. Jamesetta So felt they could take care of Lourene with hospice's help and expects her to die at home, which is the patient's preference as well.  Accordingly we are discharging Sabrin home today. She will be on morphine by CADD pump and we are placecing a PICC line to make that possible. Hospice will follow closely. An out of facility DNR has been signed.       Active Problems:   Breast cancer metastasized to multiple sites   Anemia   Anxiety   Chronic pain   Acute respiratory failure   Lymphangitic lung metastasis   Coagulopathy  47 year old [place] woman with history of breast cancer stage IV at diagnosis February 2011, with metastases to bone and possibly developing spread to lymph nodes, lungs and liver  (1) status post 30 Gy to T-3 through T-18 October 2009  (2) status post docetaxel, cyclophosphamide and doxorubicin x6, completed in June 2011.  (3) status post Left lumpectomy and sentinel lymph node biopsy August 2011 for a 2-mm focus of residual invasive ductal carcinoma, grade 2, negative sentinel lymph node  (4) tamoxifen started July 2011, with evidence of progression March 2013  (5) status post radiation to Left breast completed November 2011  (6) s/p radiation to the lower spine, completed 11/15/2011  (7) fulvestrant/goserelin  started 11/03/2011, with evidence of progression in September 2013  (8) status post palliative radiation to the right hip in September 2013  (9) on goserelin and adding anastrazole, beginning 05/09/2012 (10) on zolendronic acid at various intervals  Diet:  As tolerated  Activity:  Bed rest  Condition at Discharge:   improved  Signed: Dr. Ruthann Cancer [phone]  10/06/2012, 8:49 AM

## 2012-10-06 NOTE — Progress Notes (Signed)
Peripherally Inserted Central Catheter/Midline Placement  The IV Nurse has discussed with the patient and/or persons authorized to consent for the patient, the purpose of this procedure and the potential benefits and risks involved with this procedure.  The benefits include less needle sticks, lab draws from the catheter and patient may be discharged home with the catheter.  Risks include, but not limited to, infection, bleeding, blood clot (thrombus formation), and puncture of an artery; nerve damage and irregular heat beat.  Alternatives to this procedure were also discussed.  PICC/Midline Placement Documentation  PICC / Midline Double Lumen 10/06/12 PICC Right Basilic (Active)  Dressing Change Due 10/13/12 10/06/2012 12:00 PM       Stacie Glaze Horton 10/06/2012, 12:07 PM

## 2012-10-06 NOTE — Progress Notes (Signed)
Reviewed AVS and prescriptions with mother.  She verbalized understanding.  Called [facility] to alert about discharge.  Patient will travel home in family car per help with her son.

## 2012-10-06 NOTE — Progress Notes (Signed)
Room 1325 - Kristin [name] - [facility] RN Visit-R.Lun Muro RN  Related admission to [facility] diagnosis of metastatic breast cancer.  Pt is DNR code - OOF DNR signed by Dr. Darnelle Catalan and is on shadow chart for transport home today.    Pt arousable and disoriented on first visit this am 8:15, lying in bed, did not want me to leave the room-asked me to stay for a while.  Dr. Darnelle Catalan came for visit and assessment-he called pt's mother and discussed discharge today.  Mother requests pt be discharged home - Dr. Judie Petit told mother he thinks pt will die this weekend and questioned if she and caregivers would be able to handle her 24/7 care.  Pt's mother assured Dr. Judie Petit that they wanted pt to die at home.  Follow up visit 9:00am - pt was sitting up attempting to eat breakfast - continues with some confusion.  No family present.   Pt to have PICC placed this am;  To discharge on MS drip:  [name] notified 2/27 4pm and again this am; scripts faxed to [facility] [phone];  AVS faxed to [facility];  [facility] On Call Director Sarah advised of discharge and potential visits;  [facility] SW on call for the weekend [facility] advised of discharge and potential needs this weekend.  Verified with staff [facility] RN Mordecai Maes that pt has nebulized equipment in home.    Foley ordered by Dr. Judie Petit due to dysuria, no urination with multiple attempts per NT.  Pt declined foley and was able to urinate 100cc (output x last 24 hours).   Please call [facility] @ [phone]- with any hospice needs.  Thank you.  [name], RN  [facility]  Hospice Liaison

## 2012-10-08 ENCOUNTER — Emergency Department (HOSPITAL_COMMUNITY)
Admission: EM | Admit: 2012-10-08 | Discharge: 2012-10-08 | Disposition: A | Discharge status: Home / Self Care | Attending: Emergency Medicine | Admitting: Emergency Medicine

## 2012-10-08 ENCOUNTER — Encounter (HOSPITAL_COMMUNITY): Payer: Self-pay | Admitting: Emergency Medicine

## 2012-10-08 ENCOUNTER — Emergency Department (HOSPITAL_COMMUNITY)

## 2012-10-08 DIAGNOSIS — Z79899 Other long term (current) drug therapy: Secondary | ICD-10-CM | POA: Insufficient documentation

## 2012-10-08 DIAGNOSIS — Z923 Personal history of irradiation: Secondary | ICD-10-CM | POA: Insufficient documentation

## 2012-10-08 DIAGNOSIS — I10 Essential (primary) hypertension: Secondary | ICD-10-CM | POA: Insufficient documentation

## 2012-10-08 DIAGNOSIS — R0602 Shortness of breath: Secondary | ICD-10-CM | POA: Insufficient documentation

## 2012-10-08 DIAGNOSIS — Z8742 Personal history of other diseases of the female genital tract: Secondary | ICD-10-CM | POA: Insufficient documentation

## 2012-10-08 DIAGNOSIS — Z87891 Personal history of nicotine dependence: Secondary | ICD-10-CM | POA: Insufficient documentation

## 2012-10-08 DIAGNOSIS — Z853 Personal history of malignant neoplasm of breast: Secondary | ICD-10-CM | POA: Insufficient documentation

## 2012-10-08 DIAGNOSIS — E079 Disorder of thyroid, unspecified: Secondary | ICD-10-CM | POA: Insufficient documentation

## 2012-10-08 DIAGNOSIS — E876 Hypokalemia: Secondary | ICD-10-CM | POA: Insufficient documentation

## 2012-10-08 LAB — CBC WITH DIFFERENTIAL/PLATELET
Basophils Absolute: 0 10*3/uL (ref 0.0–0.1)
Eosinophils Relative: 0 % (ref 0–5)
HCT: 33.6 % — ABNORMAL LOW (ref 36.0–46.0)
Hemoglobin: 10.9 g/dL — ABNORMAL LOW (ref 12.0–15.0)
Lymphocytes Relative: 3 % — ABNORMAL LOW (ref 12–46)
MCV: 77.6 fL — ABNORMAL LOW (ref 78.0–100.0)
Monocytes Absolute: 0.3 10*3/uL (ref 0.1–1.0)
Monocytes Relative: 4 % (ref 3–12)
RDW: 19.6 % — ABNORMAL HIGH (ref 11.5–15.5)
WBC: 8.2 10*3/uL (ref 4.0–10.5)

## 2012-10-08 LAB — COMPREHENSIVE METABOLIC PANEL
AST: 22 U/L (ref 0–37)
BUN: 14 mg/dL (ref 6–23)
CO2: 33 mEq/L — ABNORMAL HIGH (ref 19–32)
Calcium: 7.9 mg/dL — ABNORMAL LOW (ref 8.4–10.5)
Creatinine, Ser: 1.04 mg/dL (ref 0.50–1.10)
GFR calc Af Amer: 73 mL/min — ABNORMAL LOW (ref >90)
GFR calc non Af Amer: 63 mL/min — ABNORMAL LOW (ref >90)
Glucose, Bld: 148 mg/dL — ABNORMAL HIGH (ref 70–99)
Total Bilirubin: 0.6 mg/dL (ref 0.3–1.2)

## 2012-10-08 LAB — CULTURE, BLOOD (ROUTINE X 2): Culture: NO GROWTH

## 2012-10-08 MED ORDER — POTASSIUM CHLORIDE 10 MEQ/100ML IV SOLN
10.0000 meq | Freq: Once | INTRAVENOUS | Status: AC
  Administered 2012-10-08: 10 meq via INTRAVENOUS
  Filled 2012-10-08: qty 100

## 2012-10-08 MED ORDER — POTASSIUM CHLORIDE CRYS ER 20 MEQ PO TBCR
40.0000 meq | EXTENDED_RELEASE_TABLET | Freq: Once | ORAL | Status: DC
  Filled 2012-10-08: qty 2

## 2012-10-08 MED ORDER — POTASSIUM CHLORIDE 20 MEQ PO PACK: 40.0000 meq | PACK | Freq: Once | ORAL | Status: DC

## 2012-10-08 MED ORDER — POTASSIUM CHLORIDE 10 MEQ/100ML IV SOLN
10.0000 meq | INTRAVENOUS | Status: AC
  Administered 2012-10-08 (×2): 10 meq via INTRAVENOUS
  Filled 2012-10-08 (×2): qty 100

## 2012-10-08 MED ORDER — POTASSIUM CHLORIDE CRYS ER 20 MEQ PO TBCR: 40.0000 meq | EXTENDED_RELEASE_TABLET | ORAL | Status: DC

## 2012-10-08 MED ORDER — ALBUTEROL SULFATE (5 MG/ML) 0.5% IN NEBU
5.0000 mg | INHALATION_SOLUTION | Freq: Once | RESPIRATORY_TRACT | Status: AC
  Administered 2012-10-08: 5 mg via RESPIRATORY_TRACT
  Filled 2012-10-08: qty 1

## 2012-10-08 MED ORDER — IPRATROPIUM BROMIDE 0.02 % IN SOLN
0.5000 mg | Freq: Once | RESPIRATORY_TRACT | Status: AC
  Administered 2012-10-08: 0.5 mg via RESPIRATORY_TRACT
  Filled 2012-10-08: qty 2.5

## 2012-10-08 NOTE — ED Notes (Signed)
RT at bedside.

## 2012-10-08 NOTE — ED Provider Notes (Signed)
History     CSN: [id number]  Arrival date & time 10/08/12  1051   First MD Initiated Contact with Patient 10/08/12 1108      Chief Complaint  Patient presents with  . Respiratory Distress    (Consider location/radiation/quality/duration/timing/severity/associated sxs/prior treatment) The history is provided by a parent.  Kristin [name] is a 47 y.o. female hx of metastatic breast cancer that is end-stage on hospice here presenting with shortness of breath. She was recently admitted for pneumonia and finished a course of antibiotics. She was discharged 3 days ago to go back home. As per the discharge note the oncologist wanted to send her to a facility by family wanted her to die at home so she is on a PCA pump at home. Overnight she is becoming worse short of breath and per EMS was in rapid SVT en route that resolved with adenosine. Patient nonverbal and is unable to answer any questions.   Level V caveat- condition of the patient   Past Medical History  Diagnosis Date  . Breast lump   . Thyroid disease   . Hypertension   . Hx of radiation therapy 06/2010, 10/2009, 11/2011    L breast, T3-T12 spine, L-S spine  . 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 Breast cancer 09/2009    L breast, ER+, PR-, Her 2 -  . History of radiation therapy 10/27/11-11/15/11    lumbar/sacral spine/18MV photons    Past Surgical History  Procedure Laterality Date  . Knee arthroscopy      left  . Breast surgery      left lumpectomy  . Knee cartilage surgery  2006    History reviewed. No pertinent family history.  History  Substance Use Topics  . Smoking status: Former Smoker    Quit date: 08/25/2007  . Smokeless tobacco: Never Used  . Alcohol Use: No    OB History   Grav Para Term Preterm Abortions TAB SAB Ect Mult Living                  Review of Systems  Unable to perform ROS: Acuity of condition    Allergies  Review of patient's allergies indicates no known allergies.  Home  Medications   Current Outpatient Rx  Name  Route  Sig  Dispense  Refill  . albuterol (PROVENTIL HFA;VENTOLIN HFA) 108 (90 BASE) MCG/ACT inhaler   Inhalation   Inhale 1-2 puffs into the lungs every 6 (six) hours as needed for wheezing.         Marland Kitchen albuterol (PROVENTIL) (2.5 MG/3ML) 0.083% nebulizer solution   Nebulization   Take 2.5 mg by nebulization every 6 (six) hours as needed for wheezing.         Marland Kitchen anastrozole (ARIMIDEX) 1 MG tablet   Oral   Take 1 mg by mouth every morning.          . bisacodyl (DULCOLAX) 10 MG suppository   Rectal   Place 10 mg rectally as needed for constipation.         . docusate sodium (COLACE) 100 MG capsule   Oral   Take 100 mg by mouth every morning.          . furosemide (LASIX) 40 MG tablet   Oral   Take 40 mg by mouth every morning.          Marland Kitchen levothyroxine (SYNTHROID, LEVOTHROID) 150 MCG tablet   Oral   Take 150 mcg by mouth every morning.          Marland Kitchen  LORazepam (ATIVAN) 1 MG tablet   Oral   Take 1 mg by mouth 2 (two) times daily as needed for anxiety.         . metoCLOPramide (REGLAN) 10 MG tablet   Oral   Take 10 mg by mouth 4 (four) times daily.         Marland Kitchen morphine 5 mg/mL SOLN   Intravenous   Inject 2 mg into the vein every hour as needed (dyspnea).         Marland Kitchen omeprazole (PRILOSEC) 20 MG capsule   Oral   Take 40 mg by mouth 2 (two) times daily.         . ondansetron (ZOFRAN-ODT) 4 MG disintegrating tablet   Oral   Take 4 mg by mouth every 8 (eight) hours as needed for nausea.         . predniSONE (STERAPRED UNI-PAK) 10 MG tablet   Oral   Take 10 mg by mouth daily. Take one tablet daily in AM         . promethazine (PHENERGAN) 25 MG tablet   Oral   Take 25 mg by mouth every 6 (six) hours as needed for nausea.         . sodium chloride 0.9 % SOLN with morphine 10 MG/ML SOLN 1 mg/mL   Intravenous   Inject 4 mg/hr into the vein continuous.         Marland Kitchen venlafaxine XR (EFFEXOR-XR) 75 MG 24 hr  capsule   Oral   Take 150 mg by mouth every morning.            BP 145/77  Pulse 95  Temp(Src) 99.2 F (37.3 C) (Rectal)  SpO2 99%  Physical Exam  Nursing note and vitals reviewed. Constitutional:  Chronically ill, slightly tachypneic but not in distress   HENT:  Head: Normocephalic.  Mouth/Throat: Oropharynx is clear and moist.  Eyes: Conjunctivae are normal. Pupils are equal, round, and reactive to light.  Neck: Normal range of motion. Neck supple.  Cardiovascular: Normal rate, regular rhythm and normal heart sounds.   Pulmonary/Chest:  Crackles R lung. Dec breath sounds throughout   Abdominal: Soft. Bowel sounds are normal. She exhibits no distension. There is no tenderness. There is no rebound.  Musculoskeletal: She exhibits no edema and no tenderness.  Neurological:  Awake, alert, nonverbal   Skin: Skin is warm and dry.  Psychiatric:  Unable     ED Course  Procedures (including critical care time)  Labs Reviewed  CBC WITH DIFFERENTIAL - Abnormal; Notable for the following:    Hemoglobin 10.9 (*)    HCT 33.6 (*)    MCV 77.6 (*)    MCH 25.2 (*)    RDW 19.6 (*)    Neutrophils Relative 93 (*)    Lymphocytes Relative 3 (*)    Lymphs Abs 0.3 (*)    All other components within normal limits  COMPREHENSIVE METABOLIC PANEL - Abnormal; Notable for the following:    Potassium 2.9 (*)    Chloride 93 (*)    CO2 33 (*)    Glucose, Bld 148 (*)    Calcium 7.9 (*)    Albumin 2.4 (*)    Alkaline Phosphatase 175 (*)    GFR calc non Af Amer 63 (*)    GFR calc Af Amer 73 (*)    All other components within normal limits   Dg Chest Portable 1 View  10/08/2012  *RADIOLOGY REPORT*  Clinical Data: Respiratory distress.  PORTABLE CHEST - 1 VIEW  Comparison: 10/06/2012  Findings: Severe diffuse bilateral airspace disease.   I suspect the majority of the opacities are related to chronic lung disease and fibrosis. Appearance is stable dating back to December of last year.  Stable  left apical pleural thickening.  Heart is normal size.  Right PICC line is in place with the tip in the SVC.  IMPRESSION: Stable severe bilateral airspace opacities, likely chronic lung disease.  Right PICC line tip in the SVC.   Original Report Authenticated By: Charlett Nose, M.D.      No diagnosis found.   Date: 10/08/2012  Rate: 106  Rhythm: sinus tachycardia  QRS Axis: normal  Intervals: normal  ST/T Wave abnormalities: nonspecific ST changes  Conduction Disutrbances:none  Narrative Interpretation:   Old EKG Reviewed: unchanged    MDM  Kristin [name] is a 47 y.o. female here with SOB. I discussed care with mother, who understands that the patient has a terminal condition and that she is DNR. I also called the hospice nurse, Thayer Ohm, who knows about the situation and mentions that the rest of the family has a hard time with her condition. I will get basic labs, CXR. I will discuss prognosis with the rest of her family.   1 PM Patient's CXR stable. Labs stable except K 2.9. I supplemented K. I had a discussion with her mother, son, and church friend regarding prognosis. I told them that she is in the process of dying and may be worse. They should call the hospice nurse prior to coming to the ED. They voice understanding.        Richardean Canal, MD 10/08/12 1332

## 2012-10-08 NOTE — ED Notes (Signed)
Patient is off non-rebreather and has decreased work of breathing.  Patient is alert and oriented and visiting with family.  Patient states she feels much better and is ready to go home once her fluids have finished.

## 2012-10-08 NOTE — ED Notes (Signed)
Bed:WA04<BR> Expected date:<BR> Expected time:<BR> Means of arrival:<BR> Comments:<BR> EMS

## 2012-10-08 NOTE — ED Notes (Signed)
EMS reports patient with breast CA and lung/bone mets presents with respiratory distress that has been worsening over the last several weeks.  Patient's HR was 210 on arrival, adenosine was given and HR is in the 80's now.  Patient is on a non-rebreather and sat at 100%.  Patient denies CP on arrival.  Patient presents with rales on arrival.

## 2012-10-09 ENCOUNTER — Other Ambulatory Visit: Payer: Self-pay | Admitting: *Deleted

## 2012-10-09 MED ORDER — HYDROMORPHONE HCL 4 MG PO TABS: 4.0000 mg | ORAL_TABLET | ORAL | Status: DC | PRN

## 2012-10-09 MED ORDER — FENTANYL 50 MCG/HR TD PT72: 1.0000 | MEDICATED_PATCH | TRANSDERMAL | Status: DC

## 2012-10-09 NOTE — Telephone Encounter (Signed)
PT. DID NOT LIKE THE MORPHINE PUMP. SHE WAS VERY RESTLESS. PT. WENT TO THE EMERGENCY ROOM WHERE THE PUMP WAS DISCONTINUED. SHE HAS ON A FENTANYL PATCH . PT. NEEDS DILAUDID 4MG  TABLETS TO TAKE EVERY FOUR HOURS AS NEEDED FOR BREAKTHROUGH PAIN. THE PRESCRIPTION SHOULD BE SENT TO [facility] AT [address]. NOTIFIED DR.MAGRINAT'S NURSE, VAL DODD,RN. SHE WILL TAKE CARE OF SENDING THE PRESCRIPTION.

## 2012-10-12 ENCOUNTER — Other Ambulatory Visit: Payer: Self-pay | Admitting: *Deleted

## 2012-10-15 ENCOUNTER — Other Ambulatory Visit: Payer: Self-pay | Admitting: Oncology

## 2012-10-16 ENCOUNTER — Other Ambulatory Visit: Payer: Self-pay | Admitting: Lab

## 2012-10-16 ENCOUNTER — Other Ambulatory Visit: Payer: Self-pay | Admitting: *Deleted

## 2012-10-16 MED ORDER — METRONIDAZOLE 500 MG PO TABS: 500.0000 mg | ORAL_TABLET | Freq: Three times a day (TID) | ORAL | Status: DC

## 2012-10-16 MED ORDER — PREDNISONE (PAK) 10 MG PO TABS: 10.0000 mg | ORAL_TABLET | Freq: Every day | ORAL | Status: DC

## 2012-10-16 NOTE — Progress Notes (Signed)
Kristin [name] called stating per visit today pt is having recurrent diarrhea. Family had C diff order in home from inpt stay and inquired if stool should be checked. Kristin [name] will bring sample into clinic.

## 2012-10-16 NOTE — Telephone Encounter (Signed)
Received positive C Diff per stool sample brought in by Hospice today.  Per MD order for Flagyl given per verification that pt can swallow tablets-  This RN spoke with Jaymes Graff at [phone] who states pt is able to swallow tablets without difficulty.  She will call family and discuss results, concerns and medication.  No other needs at this time.

## 2012-10-17 ENCOUNTER — Other Ambulatory Visit: Payer: Self-pay | Admitting: *Deleted

## 2012-10-18 ENCOUNTER — Ambulatory Visit (HOSPITAL_BASED_OUTPATIENT_CLINIC_OR_DEPARTMENT_OTHER): Payer: Medicare Other | Admitting: Oncology

## 2012-10-18 ENCOUNTER — Other Ambulatory Visit (HOSPITAL_BASED_OUTPATIENT_CLINIC_OR_DEPARTMENT_OTHER): Payer: Medicare Other | Admitting: Lab

## 2012-10-18 ENCOUNTER — Ambulatory Visit (HOSPITAL_BASED_OUTPATIENT_CLINIC_OR_DEPARTMENT_OTHER): Payer: Medicare Other

## 2012-10-18 ENCOUNTER — Other Ambulatory Visit: Payer: Self-pay

## 2012-10-18 VITALS — BP 161/92 | HR 128 | Temp 98.6°F | Resp 20 | Ht 74.0 in | Wt 133.2 lb

## 2012-10-18 VITALS — BP 143/98 | HR 123

## 2012-10-18 DIAGNOSIS — R197 Diarrhea, unspecified: Secondary | ICD-10-CM

## 2012-10-18 DIAGNOSIS — C50919 Malignant neoplasm of unspecified site of unspecified female breast: Secondary | ICD-10-CM

## 2012-10-18 DIAGNOSIS — F329 Major depressive disorder, single episode, unspecified: Secondary | ICD-10-CM

## 2012-10-18 DIAGNOSIS — C7951 Secondary malignant neoplasm of bone: Secondary | ICD-10-CM

## 2012-10-18 DIAGNOSIS — D689 Coagulation defect, unspecified: Secondary | ICD-10-CM

## 2012-10-18 DIAGNOSIS — C800 Disseminated malignant neoplasm, unspecified: Secondary | ICD-10-CM

## 2012-10-18 DIAGNOSIS — I2699 Other pulmonary embolism without acute cor pulmonale: Secondary | ICD-10-CM

## 2012-10-18 DIAGNOSIS — R0602 Shortness of breath: Secondary | ICD-10-CM

## 2012-10-18 DIAGNOSIS — C7952 Secondary malignant neoplasm of bone marrow: Secondary | ICD-10-CM

## 2012-10-18 DIAGNOSIS — C801 Malignant (primary) neoplasm, unspecified: Secondary | ICD-10-CM

## 2012-10-18 DIAGNOSIS — Z5111 Encounter for antineoplastic chemotherapy: Secondary | ICD-10-CM

## 2012-10-18 DIAGNOSIS — Z86711 Personal history of pulmonary embolism: Secondary | ICD-10-CM

## 2012-10-18 DIAGNOSIS — F410 Panic disorder [episodic paroxysmal anxiety] without agoraphobia: Secondary | ICD-10-CM

## 2012-10-18 DIAGNOSIS — C50912 Malignant neoplasm of unspecified site of left female breast: Secondary | ICD-10-CM

## 2012-10-18 DIAGNOSIS — F32A Depression, unspecified: Secondary | ICD-10-CM

## 2012-10-18 LAB — PROTIME-INR
INR: 1.2 — ABNORMAL LOW (ref 2.00–3.50)
Protime: 14.4 Seconds — ABNORMAL HIGH (ref 10.6–13.4)

## 2012-10-18 LAB — CBC WITH DIFFERENTIAL/PLATELET
Basophils Absolute: 0 10*3/uL (ref 0.0–0.1)
Eosinophils Absolute: 0 10*3/uL (ref 0.0–0.5)
HCT: 33.2 % — ABNORMAL LOW (ref 34.8–46.6)
HGB: 10.8 g/dL — ABNORMAL LOW (ref 11.6–15.9)
LYMPH%: 5.8 % — ABNORMAL LOW (ref 14.0–49.7)
MCH: 25.5 pg (ref 25.1–34.0)
MCV: 78.3 fL — ABNORMAL LOW (ref 79.5–101.0)
MONO%: 6.5 % (ref 0.0–14.0)
NEUT#: 7.8 10*3/uL — ABNORMAL HIGH (ref 1.5–6.5)
NEUT%: 87.4 % — ABNORMAL HIGH (ref 38.4–76.8)
Platelets: 200 10*3/uL (ref 145–400)

## 2012-10-18 LAB — COMPREHENSIVE METABOLIC PANEL (CC13)
ALT: 8 U/L (ref 0–55)
BUN: 7.5 mg/dL (ref 7.0–26.0)
CO2: 30 mEq/L — ABNORMAL HIGH (ref 22–29)
Calcium: 8.6 mg/dL (ref 8.4–10.4)
Chloride: 97 mEq/L — ABNORMAL LOW (ref 98–107)
Creatinine: 1 mg/dL (ref 0.6–1.1)
Glucose: 159 mg/dl — ABNORMAL HIGH (ref 70–99)

## 2012-10-18 MED ORDER — SODIUM CHLORIDE 0.9 % IV SOLN
INTRAVENOUS | Status: DC
  Administered 2012-10-18: 15:00:00 via INTRAVENOUS

## 2012-10-18 MED ORDER — OXYCODONE-ACETAMINOPHEN 5-325 MG PO TABS
2.0000 | ORAL_TABLET | Freq: Once | ORAL | Status: AC
  Administered 2012-10-18: 2 via ORAL

## 2012-10-18 MED ORDER — ALBUTEROL SULFATE (2.5 MG/3ML) 0.083% IN NEBU
2.5000 mg | INHALATION_SOLUTION | Freq: Once | RESPIRATORY_TRACT | Status: AC
  Administered 2012-10-18: 2.5 mg via RESPIRATORY_TRACT
  Filled 2012-10-18: qty 3

## 2012-10-18 MED ORDER — GOSERELIN ACETATE 10.8 MG ~~LOC~~ IMPL
10.8000 mg | DRUG_IMPLANT | Freq: Once | SUBCUTANEOUS | Status: AC
  Administered 2012-10-18: 10.8 mg via SUBCUTANEOUS
  Filled 2012-10-18: qty 10.8

## 2012-10-18 MED ORDER — ZOLEDRONIC ACID 4 MG/100ML IV SOLN
4.0000 mg | Freq: Once | INTRAVENOUS | Status: AC
  Administered 2012-10-18: 4 mg via INTRAVENOUS
  Filled 2012-10-18: qty 100

## 2012-10-18 MED ORDER — HEPARIN SOD (PORK) LOCK FLUSH 100 UNIT/ML IV SOLN
500.0000 [IU] | Freq: Once | INTRAVENOUS | Status: DC | PRN
  Filled 2012-10-18: qty 5

## 2012-10-18 MED ORDER — LORAZEPAM 2 MG/ML IJ SOLN
0.5000 mg | Freq: Once | INTRAMUSCULAR | Status: AC
  Administered 2012-10-18: 0.5 mg via INTRAVENOUS

## 2012-10-18 NOTE — Progress Notes (Signed)
ID: Kirstie Larsen   DOB: [birth year]  MR#: [id number]  WUJ#:[id number]  HISTORY OF PRESENT ILLNESS: Eltha Tingley was originally seen in the hospital for an initial consult on September 17, 2009. At that time, she presented with back pain and was found to have a markedly abnormal left breast and axilla as well as lytic bone lesions. We obtained a biopsy of the left breast on February 8th which showed a high-grade tumor which was ER 100% receptor positive but Her-2 and progesterone receptor negative. The proliferation fraction was elevated at 33%. We then obtained a bone biopsy on February 11th and her T12 vertebral body indeed was found to harbor an estrogen receptor positive, progesterone receptor negative carcinoma consistent with her breast primary.  She had complete staging studies including a brain MRI with and without contrast which showed no evidence of metastatic disease to the brain, and a CT of the abdomen and pelvis which showed no liver lesions and no other lesions other than in the bones. There was a fibroid uterus incidentally noted. She had a CT of the chest previously (February 7th) which showed only some chronic appearing changes in the left upper lobe, consistent with her history of treated tuberculosis. Again, there were multiple lytic bone lesions.  Fatou was treated in the neoadjuvant setting of docetaxel cyclophosphamide and doxorubicin x6, completed June 2011. She also received radiation therapy to T3 through T12.  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 Grade 2, negative sentinel lymph node. Patient was started on tamoxifen in July 2011, in addition to zoledronic acid which is been given monthly. Her subsequent treatment is as detailed below.  INTERVAL HISTORY: Marlaya returns today accompanied by her mother and sister for followup of her metastatic breast carcinoma.  She is currently under hospice care at home Samya finds this very helpful as does her mother. Since her last visit here Talyn has been diagnosed with C. difficile colitis  REVIEW OF SYSTEMS: She had one loose bowel movement today. That was in the morning. She had several yesterday. She has had no nausea or vomiting. She does well with clear liquids including broth, T., water, and Jell-O. When she has solids than she doesn't do so well. She denies headaches, visual changes, cough, phlegm production, or pleurisy, but she is frequently short of breath and needs oxygen breathing treatments. There has been no hemoptysis. She denies pain. A detailed review of systems was otherwise stable   PAST MEDICAL HISTORY: Past Medical History  Diagnosis Date  . Breast lump   . Thyroid disease   . Hypertension   . Hx of radiation therapy 06/2010, 10/2009, 11/2011    L breast, T3-T12 spine, L-S spine  . 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 Breast cancer 09/2009    L breast, ER+, PR-, Her 2 -  . History of radiation therapy 10/27/11-11/15/11    lumbar/sacral spine/18MV photons    PAST SURGICAL HISTORY: Past Surgical History  Procedure Laterality Date  . Knee arthroscopy      left  . Breast surgery      left lumpectomy  . Knee cartilage surgery  2006    FAMILY HISTORY Patient's mother is alive and in her mid 24s. She has a history of treated tuberculosis. The patient's father died from a drug overdose, and the patient does not know much about him. She has one  brother with hypertension and one sister, Dois Davenport. There is no known history of breast or ovarian cancer in the family.   GYNECOLOGIC HISTORY: Gx P1, first pregnancy to term at age 9. Her son was premature.  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 She is currently unemployed. She has been living with her mother, Shakina Choy, but now has her own place. The patient's son. [name], has  graduated from high school and has applied for a job core position. He has moved to [place] to be with his father. The patient is a Control and instrumentation engineer.    ADVANCED DIRECTIVES: DNR in place  HEALTH MAINTENANCE: History  Substance Use Topics  . Smoking status: Former Smoker    Quit date: 08/25/2007  . Smokeless tobacco: Never Used  . Alcohol Use: No     Colonoscopy:  PAP:  Bone density:  Lipid panel:  No Known Allergies  Current Outpatient Prescriptions  Medication Sig Dispense Refill  . albuterol (PROVENTIL HFA;VENTOLIN HFA) 108 (90 BASE) MCG/ACT inhaler Inhale 1-2 puffs into the lungs every 6 (six) hours as needed for wheezing.      Marland Kitchen albuterol (PROVENTIL) (2.5 MG/3ML) 0.083% nebulizer solution Take 2.5 mg by nebulization every 6 (six) hours as needed for wheezing.      Marland Kitchen anastrozole (ARIMIDEX) 1 MG tablet Take 1 mg by mouth every morning.       . fentaNYL (DURAGESIC - DOSED MCG/HR) 50 MCG/HR Place 1 patch (50 mcg total) onto the skin every 3 (three) days.  5 patch  0  . furosemide (LASIX) 40 MG tablet Take 40 mg by mouth every morning.       Marland Kitchen HYDROmorphone (DILAUDID) 4 MG tablet Take 1 tablet (4 mg total) by mouth every 4 (four) hours as needed for pain.  60 tablet  0  . levothyroxine (SYNTHROID, LEVOTHROID) 150 MCG tablet Take 150 mcg by mouth every morning.       Marland Kitchen LORazepam (ATIVAN) 1 MG tablet Take 1 mg by mouth 2 (two) times daily as needed for anxiety.      . metroNIDAZOLE (FLAGYL) 500 MG tablet Take 1 tablet (500 mg total) by mouth 3 (three) times daily.  42 tablet  0  . omeprazole (PRILOSEC) 20 MG capsule Take 40 mg by mouth 2 (two) times daily.      . ondansetron (ZOFRAN-ODT) 4 MG disintegrating tablet Take 4 mg by mouth every 8 (eight) hours as needed for nausea.      . predniSONE (STERAPRED UNI-PAK) 10 MG tablet Take 1 tablet (10 mg total) by mouth daily. Take one tablet daily in AM  30 tablet  1  . promethazine (PHENERGAN) 25 MG tablet Take 25 mg by mouth every 6 (six) hours as  needed for nausea.      . sodium chloride 0.9 % SOLN with morphine 10 MG/ML SOLN 1 mg/mL Inject 4 mg/hr into the vein continuous.      Marland Kitchen venlafaxine XR (EFFEXOR-XR) 75 MG 24 hr capsule Take 150 mg by mouth every morning.       . metoCLOPramide (REGLAN) 10 MG tablet TAKE 1 TABLET BY MOUTH FOUR TIMES DAILY  60 tablet  0   No current facility-administered medications for this visit.    OBJECTIVE: Middle-aged [place] American woman examined in a wheelchair Filed Vitals:   10/18/12 1333  BP: 161/92  Pulse: 128  Temp: 98.6 F (37 C)  Resp: 20     Body mass index  is 17.09 kg/(m^2).    ECOG FS: 2 Filed Weights   10/18/12 1333  Weight: 133 lb 3.2 oz (60.419 kg)   Physical Exam: HEENT:  Sclerae anicteric.  Oropharynx dry but clear. Breast Exam:  Deferred Lungs:  Clear to auscultation bilaterally.  Failure excursion Heart:  Regular rate and rhythm.   Abdomen:  Soft, nontender.  Positive bowel sounds.   Extremities:  No peripheral edema.  Neuro:  Nonfocal, well oriented, positive affect  LAB RESULTS: Lab Results  Component Value Date   WBC 9.0 10/18/2012   NEUTROABS 7.8* 10/18/2012   HGB 10.8* 10/18/2012   HCT 33.2* 10/18/2012   MCV 78.3* 10/18/2012   PLT 200 10/18/2012      Chemistry      Component Value Date/Time   NA 136 10/08/2012 1118   NA 135* 08/28/2012 1343   K 2.9* 10/08/2012 1118   K 3.5 08/28/2012 1343   CL 93* 10/08/2012 1118   CL 93* 08/28/2012 1343   CO2 33* 10/08/2012 1118   CO2 33* 08/28/2012 1343   BUN 14 10/08/2012 1118   BUN 8.0 08/28/2012 1343   CREATININE 1.04 10/08/2012 1118   CREATININE 0.8 08/28/2012 1343      Component Value Date/Time   CALCIUM 7.9* 10/08/2012 1118   CALCIUM 9.7 08/28/2012 1343   ALKPHOS 175* 10/08/2012 1118   ALKPHOS 1,321 Repeated and Verified* 08/28/2012 1343   AST 22 10/08/2012 1118   AST 61* 08/28/2012 1343   ALT 12 10/08/2012 1118   ALT 48 08/28/2012 1343   BILITOT 0.6 10/08/2012 1118   BILITOT 1.35* 08/28/2012 1343       Lab Results  Component  Value Date   LABCA2 666* 07/25/2012     STUDIES: Dg Chest 2 View  10/03/2012  *RADIOLOGY REPORT*  Clinical Data: Shortness of breath, history breast cancer  CHEST - 2 VIEW  Comparison: October 02, 2012  Findings: There is volume loss with consolidation / scarring of bilateral upper lobes.  The right upper lobe is slightly better aerated compared prior exam.  There is patchy consolidation of left lung base with left pleural effusion unchanged.  There is a small posterior right pleural effusion.  There is increased pulmonary interstitium in the central lungs, could be due to pulmonary edema. The mediastinal contour and cardiac silhouette are stable.  There is abnormal irregular sclerosis of the right glenoid, unchanged.  IMPRESSION: Persistent consolidation of left lung base with left pleural effusion unchanged. Small right pleural effusion.  There is consolidation with scarring of bilateral upper lobes; the right upper lobe is slightly better aerated compared to prior exam.   Original Report Authenticated By: Sherian Rein, M.D.    X-ray Chest Pa And Lateral   10/02/2012  *RADIOLOGY REPORT*  Clinical Data: Shortness of breath, history of breast cancer, confusion  CHEST - 2 VIEW  Comparison: 09/07/2012  Findings: Cardiac leads obscure detail.  Lung volumes are extremely low, with crowding of the bronchovascular markings at the bases. Chronic upper retraction of the hila with biapical volume loss again noted, but with increased apical volume loss, particularly the right upper lobe with.  Increased biapical pleural thickening/fluid and increased bilateral small pleural effusions obscuring lung bases.  Irregular sclerosis of the right glenohumeral joint is stable.  Heart size is not well assessed due to overlying soft tissue structures but is grossly within normal limits.  No pneumothorax.  IMPRESSION: Increased bilateral pleural effusions with presumed bibasilar atelectasis, although pneumonia could be  obscured.  Increased bilateral upper lobe volume loss.   Original Report Authenticated By: Christiana Pellant, M.D.    Dg Chest Portable 1 View  10/08/2012  *RADIOLOGY REPORT*  Clinical Data: Respiratory distress.  PORTABLE CHEST - 1 VIEW  Comparison: 10/06/2012  Findings: Severe diffuse bilateral airspace disease.   I suspect the majority of the opacities are related to chronic lung disease and fibrosis. Appearance is stable dating back to December of last year.  Stable left apical pleural thickening.  Heart is normal size.  Right PICC line is in place with the tip in the SVC.  IMPRESSION: Stable severe bilateral airspace opacities, likely chronic lung disease.  Right PICC line tip in the SVC.   Original Report Authenticated By: Charlett Nose, M.D.    Dg Chest Port 1 View  10/06/2012  *RADIOLOGY REPORT*  Clinical Data: Evaluate right PICC line place  PORTABLE CHEST - 1 VIEW  Comparison: 10/03/2012  Findings: Low lung volumes.  Stable bilateral upper lobe and left lower lobe opacities with scarring/fibrosis.  Small left pleural effusion with left apical capping.  No pneumothorax.  Right arm PICC terminates in the lower SVC.  The heart is top normal in size.  IMPRESSION: Right arm PICC terminates in the lower SVC.  No pneumothorax.  Stable bilateral upper lobe and left lower lobe opacities with scarring/fibrosis.  Small left pleural effusion with left apical capping.   Original Report Authenticated By: Charline Bills, M.D.    ASSESSMENT: 47 y.o.   woman with history of breast cancer stage IV at diagnosis February 2011, with metastases to bone and possibly developing spread to lymph nodes, lungs and liver  (1) status post 30 Gy to T-3 through T-18 October 2009  (2) status post docetaxel, cyclophosphamide and doxorubicin x6, completed in June 2011.  (3) status post Left lumpectomy and sentinel lymph node biopsy August 2011 for a 2-mm focus of residual invasive ductal carcinoma, grade 2, negative  sentinel lymph node  (4) tamoxifen started July 2011, with evidence of progression March 2013  (5) status post radiation to Left breast completed November 2011  (6) s/p radiation to the lower spine, completed 11/15/2011  (7) fulvestrant/goserelin started 11/03/2011, with evidence of progression in September 2013  (8)  status post palliative radiation to the right hip in September 2013  (9) Continuing goserelin and adding anastrazole, beginning 05/09/2012  (10)  continuing on zolendronic acid every other month  PLAN:  Crysta is hanging on. The blessing is that she is not having pain. She is declining, and becoming progressively short of breath. She may need to be on a morphine drip at some point but not yet. The issue right now is the diarrhea, and we are going to continue clear liquids, and Imodium, and at fluoroscopy store. She may try Ensure or 3 times a day she can tolerate it. I would think after another week or so this problem should be pretty much gone but if the Flagyl does not work we will switch her to oral vancomycin. She will receive zoledronic acid today and again 2 months from now. She knows to call for any problems that may develop before the next visit.  As per prior discussions Yanice should not be resuscitated in case of a terminal event.    MAGRINAT,GUSTAV C    10/18/2012

## 2012-10-18 NOTE — Patient Instructions (Signed)
Zoledronic Acid injection (Hypercalcemia, Oncology) What is this medicine? ZOLEDRONIC ACID (ZOE le dron ik AS id) lowers the amount of calcium loss from bone. It is used to treat too much calcium in your blood from cancer. It is also used to prevent complications of cancer that has spread to the bone. This medicine may be used for other purposes; ask your health care provider or pharmacist if you have questions. What should I tell my health care provider before I take this medicine? They need to know if you have any of these conditions: -aspirin-sensitive asthma -dental disease -kidney disease -an unusual or allergic reaction to zoledronic acid, other medicines, foods, dyes, or preservatives -pregnant or trying to get pregnant -breast-feeding How should I use this medicine? This medicine is for infusion into a vein. It is given by a health care professional in a hospital or clinic setting. Talk to your pediatrician regarding the use of this medicine in children. Special care may be needed. Overdosage: If you think you have taken too much of this medicine contact a poison control center or emergency room at once. NOTE: This medicine is only for you. Do not share this medicine with others. What if I miss a dose? It is important not to miss your dose. Call your doctor or health care professional if you are unable to keep an appointment. What may interact with this medicine? -certain antibiotics given by injection -NSAIDs, medicines for pain and inflammation, like ibuprofen or naproxen -some diuretics like bumetanide, furosemide -teriparatide -thalidomide This list may not describe all possible interactions. Give your health care provider a list of all the medicines, herbs, non-prescription drugs, or dietary supplements you use. Also tell them if you smoke, drink alcohol, or use illegal drugs. Some items may interact with your medicine. What should I watch for while using this medicine? Visit  your doctor or health care professional for regular checkups. It may be some time before you see the benefit from this medicine. Do not stop taking your medicine unless your doctor tells you to. Your doctor may order blood tests or other tests to see how you are doing. Women should inform their doctor if they wish to become pregnant or think they might be pregnant. There is a potential for serious side effects to an unborn child. Talk to your health care professional or pharmacist for more information. You should make sure that you get enough calcium and vitamin D while you are taking this medicine. Discuss the foods you eat and the vitamins you take with your health care professional. Some people who take this medicine have severe bone, joint, and/or muscle pain. This medicine may also increase your risk for a broken thigh bone. Tell your doctor right away if you have pain in your upper leg or groin. Tell your doctor if you have any pain that does not go away or that gets worse. What side effects may I notice from receiving this medicine? Side effects that you should report to your doctor or health care professional as soon as possible: -allergic reactions like skin rash, itching or hives, swelling of the face, lips, or tongue -anxiety, confusion, or depression -breathing problems -changes in vision -feeling faint or lightheaded, falls -jaw burning, cramping, pain -muscle cramps, stiffness, or weakness -trouble passing urine or change in the amount of urine Side effects that usually do not require medical attention (report to your doctor or health care professional if they continue or are bothersome): -bone, joint, or muscle pain -  fever -hair loss -irritation at site where injected -loss of appetite -nausea, vomiting -stomach upset -tired This list may not describe all possible side effects. Call your doctor for medical advice about side effects. You may report side effects to FDA at  1-800-FDA-1088. Where should I keep my medicine? This drug is given in a hospital or clinic and will not be stored at home. NOTE: This sheet is a summary. It may not cover all possible information. If you have questions about this medicine, talk to your doctor, pharmacist, or health care provider.  2013, [employer] Standard. (01/22/2011 9:06:58 AM)    Goserelin injection What is this medicine? GOSERELIN (GOE se rel in) is similar to a hormone found in the body. It lowers the amount of sex hormones that the body makes. Men will have lower testosterone levels and women will have lower estrogen levels while taking this medicine. In men, this medicine is used to treat prostate cancer; the injection is either given once per month or once every 12 weeks. A once per month injection (only) is used to treat women with endometriosis, dysfunctional uterine bleeding, or advanced breast cancer. This medicine may be used for other purposes; ask your health care provider or pharmacist if you have questions. What should I tell my health care provider before I take this medicine? They need to know if you have any of these conditions (some only apply to women): -diabetes -heart disease or previous heart attack -high blood pressure -high cholesterol -kidney disease -osteoporosis or low bone density -problems passing urine -spinal cord injury -stroke -tobacco smoker -an unusual or allergic reaction to goserelin, hormone therapy, other medicines, foods, dyes, or preservatives -pregnant or trying to get pregnant -breast-feeding How should I use this medicine? This medicine is for injection under the skin. It is given by a health care professional in a hospital or clinic setting. Men receive this injection once every 4 weeks or once every 12 weeks. Women will only receive the once every 4 weeks injection. Talk to your pediatrician regarding the use of this medicine in children. Special care may be  needed. Overdosage: If you think you have taken too much of this medicine contact a poison control center or emergency room at once. NOTE: This medicine is only for you. Do not share this medicine with others. What if I miss a dose? It is important not to miss your dose. Call your doctor or health care professional if you are unable to keep an appointment. What may interact with this medicine? -female hormones like estrogen -herbal or dietary supplements like black cohosh, chasteberry, or DHEA -female hormones like testosterone -prasterone This list may not describe all possible interactions. Give your health care provider a list of all the medicines, herbs, non-prescription drugs, or dietary supplements you use. Also tell them if you smoke, drink alcohol, or use illegal drugs. Some items may interact with your medicine. What should I watch for while using this medicine? Visit your doctor or health care professional for regular checks on your progress. Your symptoms may appear to get worse during the first weeks of this therapy. Tell your doctor or healthcare professional if your symptoms do not start to get better or if they get worse after this time. Your bones may get weaker if you take this medicine for a long time. If you smoke or frequently drink alcohol you may increase your risk of bone loss. A family history of osteoporosis, chronic use of drugs for seizures (convulsions), or  corticosteroids can also increase your risk of bone loss. Talk to your doctor about how to keep your bones strong. This medicine should stop regular monthly menstration in women. Tell your doctor if you continue to [facility] Women should not become pregnant while taking this medicine or for 12 weeks after stopping this medicine. Women should inform their doctor if they wish to become pregnant or think they might be pregnant. There is a potential for serious side effects to an unborn child. Talk to your health care  professional or pharmacist for more information. Do not breast-feed an infant while taking this medicine. Men should inform their doctors if they wish to father a child. This medicine may lower sperm counts. Talk to your health care professional or pharmacist for more information. What side effects may I notice from receiving this medicine? Side effects that you should report to your doctor or health care professional as soon as possible: -allergic reactions like skin rash, itching or hives, swelling of the face, lips, or tongue -bone pain -breathing problems -changes in vision -chest pain -feeling faint or lightheaded, falls -fever, chills -pain, swelling, warmth in the leg -pain, tingling, numbness in the hands or feet -swelling of the ankles, feet, hands -trouble passing urine or change in the amount of urine -unusually high or low blood pressure -unusually weak or tired Side effects that usually do not require medical attention (report to your doctor or health care professional if they continue or are bothersome): -change in sex drive or performance -changes in breast size in both males and females -changes in emotions or moods -headache -hot flashes -irritation at site where injected -loss of appetite -skin problems like acne, dry skin -vaginal dryness This list may not describe all possible side effects. Call your doctor for medical advice about side effects. You may report side effects to FDA at 1-800-FDA-1088. Where should I keep my medicine? This drug is given in a hospital or clinic and will not be stored at home. NOTE: This sheet is a summary. It may not cover all possible information. If you have questions about this medicine, talk to your doctor, pharmacist, or health care provider.  2013, [employer] Standard. (12/10/2008 1:28:29 PM)

## 2012-10-18 NOTE — Progress Notes (Signed)
1435 Patient complains of shortness of breath and chest pain in the center of her chest which is not radiating, she denies nausea or diaphoresis. Oxygen given 2 liters via nasal cannula. Normal saline wide open. Dr. Darnelle Catalan notified. Order given and carried out for EKG and 2  Percocet 5/325 mg PO per Dr. Darnelle Catalan. 1545 Patient states chest pain and tightness has resolved.   EKG to be reviewed by Dr. Darnelle Catalan  [phone] [facility] for patient to leave per Dr. Darnelle Catalan.   Patient received 2 percocet ( 5/325) po and 0.5 mg Ativan IV patient is not to drive patient verbalized understanding. Patient's mother states "someone from hospice will pick them up."

## 2012-10-19 LAB — CANCER ANTIGEN 27.29: CA 27.29: 609 U/mL — ABNORMAL HIGH (ref 0–39)

## 2012-10-23 ENCOUNTER — Telehealth: Payer: Self-pay | Admitting: Oncology

## 2012-10-23 NOTE — Telephone Encounter (Signed)
S/W THE PT'S MOTHER AND SHE IS AWARE OF THE APPTS ON 11/20/2012. PER PT'S MOM THEY PREFER AFTERNOON APPTS OR LATE AM'S. SENT TANYA A STAFF MESSAGE TO ADD THE [facility] APPT.

## 2012-10-24 ENCOUNTER — Other Ambulatory Visit: Payer: Self-pay | Admitting: *Deleted

## 2012-10-24 MED ORDER — SACCHAROMYCES BOULARDII 250 MG PO CAPS: 250.0000 mg | ORAL_CAPSULE | Freq: Two times a day (BID) | ORAL | Status: DC

## 2012-10-24 MED ORDER — ALBUTEROL SULFATE (2.5 MG/3ML) 0.083% IN NEBU: 2.5000 mg | INHALATION_SOLUTION | Freq: Four times a day (QID) | RESPIRATORY_TRACT | Status: DC | PRN

## 2012-10-24 NOTE — Telephone Encounter (Signed)
NO ENTRY 

## 2012-10-26 ENCOUNTER — Inpatient Hospital Stay (HOSPITAL_COMMUNITY)
Admission: EM | Admit: 2012-10-26 | Discharge: 2012-11-03 | DRG: 597 | Disposition: A | Discharge status: Hospice | Attending: Internal Medicine | Admitting: Internal Medicine

## 2012-10-26 ENCOUNTER — Emergency Department (HOSPITAL_COMMUNITY)

## 2012-10-26 ENCOUNTER — Other Ambulatory Visit: Payer: Self-pay | Admitting: *Deleted

## 2012-10-26 ENCOUNTER — Encounter (HOSPITAL_COMMUNITY): Payer: Self-pay | Admitting: Emergency Medicine

## 2012-10-26 DIAGNOSIS — I471 Supraventricular tachycardia, unspecified: Secondary | ICD-10-CM

## 2012-10-26 DIAGNOSIS — I2699 Other pulmonary embolism without acute cor pulmonale: Secondary | ICD-10-CM

## 2012-10-26 DIAGNOSIS — C7800 Secondary malignant neoplasm of unspecified lung: Secondary | ICD-10-CM

## 2012-10-26 DIAGNOSIS — F329 Major depressive disorder, single episode, unspecified: Secondary | ICD-10-CM | POA: Diagnosis present

## 2012-10-26 DIAGNOSIS — D649 Anemia, unspecified: Secondary | ICD-10-CM

## 2012-10-26 DIAGNOSIS — I10 Essential (primary) hypertension: Secondary | ICD-10-CM | POA: Diagnosis present

## 2012-10-26 DIAGNOSIS — R0603 Acute respiratory distress: Secondary | ICD-10-CM

## 2012-10-26 DIAGNOSIS — C787 Secondary malignant neoplasm of liver and intrahepatic bile duct: Secondary | ICD-10-CM | POA: Diagnosis present

## 2012-10-26 DIAGNOSIS — R0989 Other specified symptoms and signs involving the circulatory and respiratory systems: Secondary | ICD-10-CM

## 2012-10-26 DIAGNOSIS — R0609 Other forms of dyspnea: Secondary | ICD-10-CM

## 2012-10-26 DIAGNOSIS — E039 Hypothyroidism, unspecified: Secondary | ICD-10-CM

## 2012-10-26 DIAGNOSIS — R7989 Other specified abnormal findings of blood chemistry: Secondary | ICD-10-CM

## 2012-10-26 DIAGNOSIS — D630 Anemia in neoplastic disease: Secondary | ICD-10-CM | POA: Diagnosis present

## 2012-10-26 DIAGNOSIS — F410 Panic disorder [episodic paroxysmal anxiety] without agoraphobia: Secondary | ICD-10-CM

## 2012-10-26 DIAGNOSIS — R918 Other nonspecific abnormal finding of lung field: Secondary | ICD-10-CM

## 2012-10-26 DIAGNOSIS — Z66 Do not resuscitate: Secondary | ICD-10-CM | POA: Diagnosis present

## 2012-10-26 DIAGNOSIS — F419 Anxiety disorder, unspecified: Secondary | ICD-10-CM

## 2012-10-26 DIAGNOSIS — R55 Syncope and collapse: Secondary | ICD-10-CM

## 2012-10-26 DIAGNOSIS — S329XXA Fracture of unspecified parts of lumbosacral spine and pelvis, initial encounter for closed fracture: Secondary | ICD-10-CM

## 2012-10-26 DIAGNOSIS — R197 Diarrhea, unspecified: Secondary | ICD-10-CM

## 2012-10-26 DIAGNOSIS — C50919 Malignant neoplasm of unspecified site of unspecified female breast: Principal | ICD-10-CM

## 2012-10-26 DIAGNOSIS — D689 Coagulation defect, unspecified: Secondary | ICD-10-CM

## 2012-10-26 DIAGNOSIS — F3289 Other specified depressive episodes: Secondary | ICD-10-CM | POA: Diagnosis present

## 2012-10-26 DIAGNOSIS — G893 Neoplasm related pain (acute) (chronic): Secondary | ICD-10-CM | POA: Diagnosis present

## 2012-10-26 DIAGNOSIS — C7951 Secondary malignant neoplasm of bone: Secondary | ICD-10-CM | POA: Diagnosis present

## 2012-10-26 DIAGNOSIS — R52 Pain, unspecified: Secondary | ICD-10-CM

## 2012-10-26 DIAGNOSIS — J9600 Acute respiratory failure, unspecified whether with hypoxia or hypercapnia: Secondary | ICD-10-CM

## 2012-10-26 DIAGNOSIS — Z79899 Other long term (current) drug therapy: Secondary | ICD-10-CM

## 2012-10-26 DIAGNOSIS — E876 Hypokalemia: Secondary | ICD-10-CM

## 2012-10-26 DIAGNOSIS — E871 Hypo-osmolality and hyponatremia: Secondary | ICD-10-CM

## 2012-10-26 DIAGNOSIS — I214 Non-ST elevation (NSTEMI) myocardial infarction: Secondary | ICD-10-CM

## 2012-10-26 DIAGNOSIS — C50912 Malignant neoplasm of unspecified site of left female breast: Secondary | ICD-10-CM

## 2012-10-26 DIAGNOSIS — D509 Iron deficiency anemia, unspecified: Secondary | ICD-10-CM

## 2012-10-26 DIAGNOSIS — Z515 Encounter for palliative care: Secondary | ICD-10-CM

## 2012-10-26 DIAGNOSIS — J189 Pneumonia, unspecified organism: Secondary | ICD-10-CM

## 2012-10-26 DIAGNOSIS — Z87891 Personal history of nicotine dependence: Secondary | ICD-10-CM

## 2012-10-26 DIAGNOSIS — R627 Adult failure to thrive: Secondary | ICD-10-CM | POA: Diagnosis present

## 2012-10-26 DIAGNOSIS — R0602 Shortness of breath: Secondary | ICD-10-CM

## 2012-10-26 DIAGNOSIS — K859 Acute pancreatitis without necrosis or infection, unspecified: Secondary | ICD-10-CM

## 2012-10-26 DIAGNOSIS — G8929 Other chronic pain: Secondary | ICD-10-CM

## 2012-10-26 DIAGNOSIS — J910 Malignant pleural effusion: Secondary | ICD-10-CM

## 2012-10-26 LAB — URINALYSIS, MICROSCOPIC ONLY
Bilirubin Urine: NEGATIVE
Glucose, UA: NEGATIVE mg/dL
Ketones, ur: NEGATIVE mg/dL
Leukocytes, UA: NEGATIVE
Nitrite: NEGATIVE
Protein, ur: NEGATIVE mg/dL
Specific Gravity, Urine: 1.015 (ref 1.005–1.030)
Urobilinogen, UA: 0.2 mg/dL (ref 0.0–1.0)
pH: 7 (ref 5.0–8.0)

## 2012-10-26 LAB — BASIC METABOLIC PANEL
BUN: 8 mg/dL (ref 6–23)
CO2: 33 mEq/L — ABNORMAL HIGH (ref 19–32)
Calcium: 8.9 mg/dL (ref 8.4–10.5)
Chloride: 97 mEq/L (ref 96–112)
Creatinine, Ser: 0.81 mg/dL (ref 0.50–1.10)
GFR calc Af Amer: >90 mL/min (ref >90)
GFR calc non Af Amer: 85 mL/min — ABNORMAL LOW (ref >90)
Glucose, Bld: 220 mg/dL — ABNORMAL HIGH (ref 70–99)
Potassium: 3.4 mEq/L — ABNORMAL LOW (ref 3.5–5.1)
Sodium: 138 mEq/L (ref 135–145)

## 2012-10-26 LAB — CBC WITH DIFFERENTIAL/PLATELET
Basophils Absolute: 0 10*3/uL (ref 0.0–0.1)
Basophils Relative: 0 % (ref 0–1)
Eosinophils Absolute: 0 10*3/uL (ref 0.0–0.7)
Eosinophils Relative: 0 % (ref 0–5)
HCT: 31.6 % — ABNORMAL LOW (ref 36.0–46.0)
Hemoglobin: 10.1 g/dL — ABNORMAL LOW (ref 12.0–15.0)
Lymphocytes Relative: 5 % — ABNORMAL LOW (ref 12–46)
Lymphs Abs: 0.4 10*3/uL — ABNORMAL LOW (ref 0.7–4.0)
MCH: 25.3 pg — ABNORMAL LOW (ref 26.0–34.0)
MCHC: 32 g/dL (ref 30.0–36.0)
MCV: 79 fL (ref 78.0–100.0)
Monocytes Absolute: 0.1 10*3/uL (ref 0.1–1.0)
Monocytes Relative: 2 % — ABNORMAL LOW (ref 3–12)
Neutro Abs: 7.1 10*3/uL (ref 1.7–7.7)
Neutrophils Relative %: 93 % — ABNORMAL HIGH (ref 43–77)
Platelets: 272 10*3/uL (ref 150–400)
RBC: 4 MIL/uL (ref 3.87–5.11)
RDW: 20.3 % — ABNORMAL HIGH (ref 11.5–15.5)
WBC: 7.6 10*3/uL (ref 4.0–10.5)

## 2012-10-26 LAB — BLOOD GAS, ARTERIAL
Bicarbonate: 34.3 mEq/L — ABNORMAL HIGH (ref 20.0–24.0)
TCO2: 31.7 mmol/L (ref 0–100)
pCO2 arterial: 52 mmHg — ABNORMAL HIGH (ref 35.0–45.0)
pH, Arterial: 7.435 (ref 7.350–7.450)

## 2012-10-26 MED ORDER — LORAZEPAM 1 MG PO TABS: 1.0000 mg | ORAL_TABLET | Freq: Two times a day (BID) | ORAL | Status: DC | PRN

## 2012-10-26 MED ORDER — LORAZEPAM 1 MG PO TABS
1.0000 mg | ORAL_TABLET | Freq: Two times a day (BID) | ORAL | Status: DC | PRN
  Administered 2012-10-27: 1 mg via ORAL
  Filled 2012-10-26: qty 1

## 2012-10-26 MED ORDER — FUROSEMIDE 10 MG/ML IJ SOLN
40.0000 mg | Freq: Once | INTRAMUSCULAR | Status: AC
  Administered 2012-10-26: 40 mg via INTRAVENOUS
  Filled 2012-10-26: qty 4

## 2012-10-26 MED ORDER — ONDANSETRON HCL 4 MG/2ML IJ SOLN
4.0000 mg | Freq: Four times a day (QID) | INTRAMUSCULAR | Status: DC | PRN
  Administered 2012-10-27 – 2012-10-30 (×4): 4 mg via INTRAVENOUS
  Filled 2012-10-26 (×4): qty 2

## 2012-10-26 MED ORDER — PIPERACILLIN-TAZOBACTAM 3.375 G IVPB
3.3750 g | Freq: Three times a day (TID) | INTRAVENOUS | Status: DC
  Administered 2012-10-27 – 2012-11-03 (×23): 3.375 g via INTRAVENOUS
  Filled 2012-10-26 (×24): qty 50

## 2012-10-26 MED ORDER — ROCURONIUM BROMIDE 50 MG/5ML IV SOLN
INTRAVENOUS | Status: AC
  Filled 2012-10-26: qty 2

## 2012-10-26 MED ORDER — FUROSEMIDE 10 MG/ML IJ SOLN
20.0000 mg | Freq: Every day | INTRAMUSCULAR | Status: DC
  Administered 2012-10-27 – 2012-10-30 (×4): 20 mg via INTRAVENOUS
  Filled 2012-10-26 (×4): qty 2

## 2012-10-26 MED ORDER — ONDANSETRON HCL 4 MG PO TABS: 4.0000 mg | ORAL_TABLET | Freq: Four times a day (QID) | ORAL | Status: DC | PRN

## 2012-10-26 MED ORDER — FENTANYL 50 MCG/HR TD PT72
50.0000 ug | MEDICATED_PATCH | TRANSDERMAL | Status: DC
  Administered 2012-10-28 – 2012-11-03 (×3): 50 ug via TRANSDERMAL
  Filled 2012-10-26 (×3): qty 1

## 2012-10-26 MED ORDER — SUCCINYLCHOLINE CHLORIDE 20 MG/ML IJ SOLN
INTRAMUSCULAR | Status: AC
  Filled 2012-10-26: qty 5

## 2012-10-26 MED ORDER — SODIUM CHLORIDE 0.9 % IJ SOLN
3.0000 mL | Freq: Two times a day (BID) | INTRAMUSCULAR | Status: DC
  Administered 2012-10-27 – 2012-10-31 (×4): 3 mL via INTRAVENOUS

## 2012-10-26 MED ORDER — LORAZEPAM 2 MG/ML IJ SOLN: 0.5000 mg | Freq: Once | INTRAMUSCULAR | Status: DC

## 2012-10-26 MED ORDER — PIPERACILLIN-TAZOBACTAM 3.375 G IVPB
3.3750 g | Freq: Once | INTRAVENOUS | Status: AC
  Administered 2012-10-26: 3.375 g via INTRAVENOUS
  Filled 2012-10-26: qty 50

## 2012-10-26 MED ORDER — FENTANYL 50 MCG/HR TD PT72: 1.0000 | MEDICATED_PATCH | TRANSDERMAL | Status: DC

## 2012-10-26 MED ORDER — SODIUM CHLORIDE 0.9 % IV SOLN
250.0000 mL | INTRAVENOUS | Status: DC | PRN
  Administered 2012-10-27 – 2012-10-30 (×2): 250 mL via INTRAVENOUS

## 2012-10-26 MED ORDER — LIDOCAINE HCL (CARDIAC) 20 MG/ML IV SOLN
INTRAVENOUS | Status: AC
  Filled 2012-10-26: qty 5

## 2012-10-26 MED ORDER — ALBUTEROL (5 MG/ML) CONTINUOUS INHALATION SOLN
15.0000 mg | INHALATION_SOLUTION | RESPIRATORY_TRACT | Status: DC
  Administered 2012-10-26: 15 mg via RESPIRATORY_TRACT
  Filled 2012-10-26: qty 20

## 2012-10-26 MED ORDER — MORPHINE SULFATE 2 MG/ML IJ SOLN
1.0000 mg | INTRAMUSCULAR | Status: DC | PRN
  Administered 2012-10-27 (×3): 1 mg via INTRAVENOUS
  Filled 2012-10-26 (×3): qty 1

## 2012-10-26 MED ORDER — DOCUSATE SODIUM 100 MG PO CAPS: 100.0000 mg | ORAL_CAPSULE | Freq: Two times a day (BID) | ORAL | Status: DC | PRN

## 2012-10-26 MED ORDER — IPRATROPIUM BROMIDE 0.02 % IN SOLN
0.5000 mg | Freq: Once | RESPIRATORY_TRACT | Status: AC
  Administered 2012-10-26: 0.5 mg via RESPIRATORY_TRACT
  Filled 2012-10-26: qty 2.5

## 2012-10-26 MED ORDER — SODIUM CHLORIDE 0.9 % IJ SOLN
3.0000 mL | INTRAMUSCULAR | Status: DC | PRN
  Administered 2012-11-02: 3 mL via INTRAVENOUS

## 2012-10-26 MED ORDER — LORAZEPAM 2 MG/ML IJ SOLN
1.0000 mg | Freq: Once | INTRAMUSCULAR | Status: AC
  Administered 2012-10-26: 1 mg via INTRAVENOUS
  Filled 2012-10-26: qty 1

## 2012-10-26 MED ORDER — METHYLPREDNISOLONE SODIUM SUCC 40 MG IJ SOLR
40.0000 mg | Freq: Every day | INTRAMUSCULAR | Status: DC
  Administered 2012-10-27 – 2012-11-01 (×7): 40 mg via INTRAVENOUS
  Filled 2012-10-26 (×7): qty 1

## 2012-10-26 MED ORDER — ETOMIDATE 2 MG/ML IV SOLN
INTRAVENOUS | Status: AC
  Filled 2012-10-26: qty 20

## 2012-10-26 MED ORDER — HEPARIN SODIUM (PORCINE) 5000 UNIT/ML IJ SOLN
5000.0000 [IU] | Freq: Three times a day (TID) | INTRAMUSCULAR | Status: DC
  Administered 2012-10-27 – 2012-10-28 (×3): 5000 [IU] via SUBCUTANEOUS
  Filled 2012-10-26 (×9): qty 1

## 2012-10-26 MED ORDER — MORPHINE SULFATE 4 MG/ML IJ SOLN
4.0000 mg | Freq: Once | INTRAMUSCULAR | Status: AC
  Administered 2012-10-26: 4 mg via INTRAVENOUS
  Filled 2012-10-26: qty 1

## 2012-10-26 MED ORDER — METOCLOPRAMIDE HCL 10 MG PO TABS: 10.0000 mg | ORAL_TABLET | Freq: Four times a day (QID) | ORAL | Status: DC

## 2012-10-26 MED ORDER — PANTOPRAZOLE SODIUM 40 MG IV SOLR
40.0000 mg | INTRAVENOUS | Status: DC
  Administered 2012-10-27 – 2012-10-30 (×5): 40 mg via INTRAVENOUS
  Filled 2012-10-26 (×6): qty 40

## 2012-10-26 NOTE — ED Notes (Signed)
ZOX:[id number]<AV> Expected date:<BR> Expected time:<BR> Means of arrival:<BR> Comments:<BR> Hold for triage

## 2012-10-26 NOTE — Progress Notes (Signed)
I was asked to come speak with the patient and her family as there was some confusion as to this patient's goals for her hospitalization. According to Dr. Cena Benton, the patient and family had agreed to palliative care/comfort measures, but apparently the patient's mother felt that the patient should be treated more aggressively. I spoke with the patient's sister Kristin [name] (who has the primary HCPOA, although she apparently did not know it until today) at the patient's bedside and both the patient and her sister agreed that all the patient wanted was to be made comfortable. They did not want to continue with BiPAP or with any other aggressive measures, and even said they did not want antibiotics at this time. The mother then entered the room and was very upset at the above plan. I again confirmed with the patient and her POA that she wanted palliative care/comfort measures only. The patient at this time was then wondering about the BiPAP. Patient currently comfortable on BiPAP, but palliative care floor cannot accept patient on BiPAP, so will try patient on NRB and attempt to place patient on palliative care floor. There will need to be a family meeting in the morning with palliative care team to ensure that everybody is on the same page to lessen the confusion regarding patient's wishes.

## 2012-10-26 NOTE — ED Provider Notes (Signed)
History    46yF with metastatic CA. Baseline SOB but worsening over past days. Found by mother today laying on couch with markedly increased WOB. Pt given duoneb and solumedrol en route. On arrival pt in marked respiratory distress. Pt unfortunately with very poor prognosis. Hx of metastatic CA. Recent admission for similar symptoms. Decision to be made comfort care at that time.  CSN: [id number]  Arrival date & time 10/26/12  1515   First MD Initiated Contact with Patient 10/26/12 1541      Chief Complaint  Patient presents with  . Shortness of Breath    (Consider location/radiation/quality/duration/timing/severity/associated sxs/prior treatment) HPI  Past Medical History  Diagnosis Date  . Breast lump   . Thyroid disease   . Hypertension   . Hx of radiation therapy 06/2010, 10/2009, 11/2011    L breast, T3-T12 spine, L-S spine  . 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 Breast cancer 09/2009    L breast, ER+, PR-, Her 2 -  . History of radiation therapy 10/27/11-11/15/11    lumbar/sacral spine/18MV photons    Past Surgical History  Procedure Laterality Date  . Knee arthroscopy      left  . Breast surgery      left lumpectomy  . Knee cartilage surgery  2006    No family history on file.  History  Substance Use Topics  . Smoking status: Former Smoker    Quit date: 08/25/2007  . Smokeless tobacco: Never Used  . Alcohol Use: No    OB History   Grav Para Term Preterm Abortions TAB SAB Ect Mult Living                  Review of Systems  Level 5 caveat applies because of marked respiratory distress.   Allergies  Review of patient's allergies indicates no known allergies.  Home Medications   Current Outpatient Rx  Name  Route  Sig  Dispense  Refill  . albuterol (PROVENTIL HFA;VENTOLIN HFA) 108 (90 BASE) MCG/ACT inhaler   Inhalation   Inhale 1-2 puffs into the lungs every 6 (six) hours as needed for wheezing.         Marland Kitchen albuterol (PROVENTIL) (2.5 MG/3ML) 0.083%  nebulizer solution   Nebulization   Take 3 mLs (2.5 mg total) by nebulization every 6 (six) hours as needed for wheezing.   75 mL   1     [facility] [place] [facility]   . anastrozole (ARIMIDEX) 1 MG tablet   Oral   Take 1 mg by mouth every morning.          . fentaNYL (DURAGESIC - DOSED MCG/HR) 50 MCG/HR   Transdermal   Place 1 patch (50 mcg total) onto the skin every 3 (three) days.   5 patch   0   . furosemide (LASIX) 40 MG tablet   Oral   Take 40 mg by mouth every morning.          Marland Kitchen HYDROmorphone (DILAUDID) 4 MG tablet   Oral   Take 1 tablet (4 mg total) by mouth every 4 (four) hours as needed for pain.   60 tablet   0   . levothyroxine (SYNTHROID, LEVOTHROID) 150 MCG tablet   Oral   Take 150 mcg by mouth every morning.          Marland Kitchen LORazepam (ATIVAN) 1 MG tablet   Oral   Take 1 tablet (1 mg total) by mouth 2 (two) times daily as needed  for anxiety.   30 tablet   4   . metoCLOPramide (REGLAN) 10 MG tablet   Oral   Take 1 tablet (10 mg total) by mouth 4 (four) times daily.   120 tablet   4   . metroNIDAZOLE (FLAGYL) 500 MG tablet   Oral   Take 1 tablet (500 mg total) by mouth 3 (three) times daily.   42 tablet   0   . omeprazole (PRILOSEC) 20 MG capsule   Oral   Take 40 mg by mouth 2 (two) times daily.         . predniSONE (STERAPRED UNI-PAK) 10 MG tablet   Oral   Take 1 tablet (10 mg total) by mouth daily. Take one tablet daily in AM   30 tablet   1   . saccharomyces boulardii (FLORASTOR) 250 MG capsule   Oral   Take 1 capsule (250 mg total) by mouth 2 (two) times daily.   60 capsule   1     [facility] [place] [facility]   . sodium chloride 0.9 % SOLN with morphine 10 MG/ML SOLN 1 mg/mL   Intravenous   Inject 4 mg/hr into the vein continuous.         Marland Kitchen venlafaxine XR (EFFEXOR-XR) 75 MG 24 hr capsule   Oral   Take 150 mg by mouth every morning.            BP 147/98  Pulse 149  Temp(Src) 99 F (37.2 C) (Oral)  SpO2  94%  Physical Exam  Nursing note and vitals reviewed. HENT:  Head: Normocephalic and atraumatic.  Eyes: Pupils are equal, round, and reactive to light.  Cardiovascular: Regular rhythm.   No murmur heard. Very tachycardic  Pulmonary/Chest: She is in respiratory distress. She has wheezes.  Abdominal: Soft. She exhibits no distension.  Musculoskeletal:  symmetric pitting LE edema  Neurological:  Very somnolent. Not opening eyes to voice but will with tactile stimulation. Mild confusion. Follows basic commands and answers some yes/no questions.  Skin: Skin is warm and dry.  Psychiatric:  Unable to assess    ED Course  Procedures (including critical care time)  Labs Reviewed  CBC WITH DIFFERENTIAL - Abnormal; Notable for the following:    Hemoglobin 10.1 (*)    HCT 31.6 (*)    MCH 25.3 (*)    RDW 20.3 (*)    Neutrophils Relative 93 (*)    Lymphocytes Relative 5 (*)    Lymphs Abs 0.4 (*)    Monocytes Relative 2 (*)    All other components within normal limits  BASIC METABOLIC PANEL - Abnormal; Notable for the following:    Potassium 3.4 (*)    CO2 33 (*)    Glucose, Bld 220 (*)    GFR calc non Af Amer 85 (*)    All other components within normal limits  URINALYSIS, MICROSCOPIC ONLY - Abnormal; Notable for the following:    Hgb urine dipstick LARGE (*)    Bacteria, UA FEW (*)    Squamous Epithelial / LPF FEW (*)    All other components within normal limits  BLOOD GAS, ARTERIAL - Abnormal; Notable for the following:    pCO2 arterial 52.0 (*)    pO2, Arterial 333.0 (*)    Bicarbonate 34.3 (*)    Acid-Base Excess 9.2 (*)    All other components within normal limits  CBC  CREATININE, SERUM   Dg Chest Portable 1 View  10/26/2012  *RADIOLOGY REPORT*  Clinical  Data: Shortness of breath.  History of metastatic breast cancer.  PORTABLE CHEST - 1 VIEW  Comparison: 10/08/2012 and chest CT 07/31/2012  Findings: Persistently very low lung volumes.  Extensive architectural  distortion in the medial lungs and upper lobes bilaterally is unchanged.  There is diffuse bilateral airspace disease is similar to recent prior examinations.  Aeration in the medial right lung base appears worse than on today's chest radiograph compared to the chest radiograph of 10/08/2012.  Left costophrenic angle is blunted and probable chronic loculated pleural fluid in the upper hemithoraces bilaterally, left greater than right.  Irregular sclerotic area in the right scapula is consistent with bony metastatic disease.  There are multiple areas of sclerosis in the thoracic spine consistent with bony metastatic disease.  There is height loss of an upper to mid thoracic spine vertebral body consistent with a compression deformity, as seen on prior CT.  IMPRESSION:  1.  Bilateral airspace disease, right worse than left, is similar compared to October 08, 2012.  Aeration at the right medial lung base is slightly worse on today's examination.  2.  Chronic, loculated appearing small bilateral pleural effusions bilaterally. 3.  Extensive bony metastatic disease.   Original Report Authenticated By: Britta Mccreedy, M.D.      1. Respiratory distress   2. Acute respiratory failure   3. Anxiety   4. Chronic pain   5. Hypothyroidism   6. Malignant pleural effusion   7. Breast cancer metastasized to multiple sites, unspecified laterality   8. Palliative care encounter       MDM  4:15 PM Pt needs intubated. Marked accessory muscle usage and tachypnea. Pt unfortunately with extremely poor prognosis. Recent notes stating DNR. Readdressed and confirmed they would like to continue to focus on comfort.         Raeford Razor, MD 10/30/12 [phone]

## 2012-10-26 NOTE — H&P (Signed)
[facility] History and Physical  Lelah Rennaker AVW:[id number] DOB: 1965/08/21 DOA: 10/26/2012  Referring physician: Dr. Juleen China PCP: [name], MD  Specialists: Oncologist as outpt Dr. Darnelle Catalan  Chief Complaint: difficulty breathing and cough  HPI: Kristin [name] is a 47 y.o. female  With history of metastatic breast cancer and recently discharged on 10/06/12 and at that time she it was recommended to go to residential hospice.  Patient refused and is presenting this admission similarly to last in respiratory distress.  History is obtained from family at bedside but reportedly patient developed respiratory difficulty last night which was associated with cough.  No sick contacts reported.  Problem was persistent and worsening and as such patient was brought into the hospital.  ER physician placed patient on Bipap, started antibiotics, and further discussed treatment options/wishes given patient's terminal condition.  Patient is to be DNR with no CPR or intubation. Is to be kept comfortable and family is agreeable to administering antibiotics for treatment should patient have chest x ray suspicious for infectious etiology.  Review of Systems: Patient is in Bipap and unable to obtain  Past Medical History  Diagnosis Date  . Breast lump   . Thyroid disease   . Hypertension   . Hx of radiation therapy 06/2010, 10/2009, 11/2011    L breast, T3-T12 spine, L-S spine  . 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 Breast cancer 09/2009    L breast, ER+, PR-, Her 2 -  . History of radiation therapy 10/27/11-11/15/11    lumbar/sacral spine/18MV photons   Past Surgical History  Procedure Laterality Date  . Knee arthroscopy      left  . Breast surgery      left lumpectomy  . Knee cartilage surgery  2006   Social History:  reports that she quit smoking about 5 years ago. She has never used smokeless tobacco. She reports that she does not drink alcohol or use illicit drugs. At Home with  mother  Can patient participate in ADLs? Not currently  No Known Allergies  History reviewed. No pertinent family history. no other reported   Prior to Admission medications   Medication Sig Start Date End Date Taking? Authorizing Provider  albuterol (PROVENTIL HFA;VENTOLIN HFA) 108 (90 BASE) MCG/ACT inhaler Inhale 1-2 puffs into the lungs every 6 (six) hours as needed for wheezing. 09/07/12  Yes Raeford Razor, MD  albuterol (PROVENTIL) (2.5 MG/3ML) 0.083% nebulizer solution Take 3 mLs (2.5 mg total) by nebulization every 6 (six) hours as needed for wheezing. 10/24/12  Yes [name], MD  anastrozole (ARIMIDEX) 1 MG tablet Take 1 mg by mouth every morning.    Yes Historical Provider, MD  fentaNYL (DURAGESIC - DOSED MCG/HR) 50 MCG/HR Place 1 patch (50 mcg total) onto the skin every 3 (three) days. 10/26/12  Yes [name], MD  furosemide (LASIX) 40 MG tablet Take 40 mg by mouth every morning.    Yes Historical Provider, MD  HYDROmorphone (DILAUDID) 4 MG tablet Take 1 tablet (4 mg total) by mouth every 4 (four) hours as needed for pain. 10/09/12  Yes Amy Allegra Grana, PA-C  levothyroxine (SYNTHROID, LEVOTHROID) 150 MCG tablet Take 150 mcg by mouth every morning.    Yes Historical Provider, MD  LORazepam (ATIVAN) 1 MG tablet Take 1 tablet (1 mg total) by mouth 2 (two) times daily as needed for anxiety. 10/26/12  Yes [name], MD  metoCLOPramide (REGLAN) 10 MG tablet Take 1 tablet (10 mg total) by  mouth 4 (four) times daily. 10/26/12  Yes [name], MD  metroNIDAZOLE (FLAGYL) 500 MG tablet Take 1 tablet (500 mg total) by mouth 3 (three) times daily. 10/16/12  Yes [name], MD  omeprazole (PRILOSEC) 20 MG capsule Take 40 mg by mouth 2 (two) times daily.   Yes Historical Provider, MD  predniSONE (STERAPRED UNI-PAK) 10 MG tablet Take 1 tablet (10 mg total) by mouth daily. Take one tablet daily in AM 10/16/12  Yes [name], MD  saccharomyces boulardii (FLORASTOR) 250  MG capsule Take 1 capsule (250 mg total) by mouth 2 (two) times daily. 10/24/12  Yes [name], MD  sodium chloride 0.9 % SOLN with morphine 10 MG/ML SOLN 1 mg/mL Inject 4 mg/hr into the vein continuous. 10/06/12  Yes [name], MD  venlafaxine XR (EFFEXOR-XR) 75 MG 24 hr capsule Take 150 mg by mouth every morning.    Yes Historical Provider, MD   Physical Exam: Filed Vitals:   10/26/12 1538 10/26/12 1539  BP: 147/98   Pulse: 149   Temp: 99 F (37.2 C)   TempSrc: Oral   SpO2: 87% 94%     General:  Pt on Bipap, somnolent/sedated  Eyes: pt with eyes closed sedated, no discharge  ENT: normal exterior appearance  Neck: no goiter  Cardiovascular: S1 and S2 WNL, no mrg  Respiratory: On bipap, rapid breathing rate  Abdomen: soft, ND, no guarding   Skin: warm and dry  Musculoskeletal: no clubbing  Psychiatric: unable to assess due to sedation  Neurologic: unable to assess due to sedation  Labs on Admission:  Basic Metabolic Panel:  Recent Labs Lab 10/26/12 1640  NA 138  K 3.4*  CL 97  CO2 33*  GLUCOSE 220*  BUN 8  CREATININE 0.81  CALCIUM 8.9   Liver Function Tests: No results found for this basename: AST, ALT, ALKPHOS, BILITOT, PROT, ALBUMIN,  in the last 168 hours No results found for this basename: LIPASE, AMYLASE,  in the last 168 hours No results found for this basename: AMMONIA,  in the last 168 hours CBC:  Recent Labs Lab 10/26/12 1640  WBC 7.6  NEUTROABS 7.1  HGB 10.1*  HCT 31.6*  MCV 79.0  PLT 272   Cardiac Enzymes: No results found for this basename: CKTOTAL, CKMB, CKMBINDEX, TROPONINI,  in the last 168 hours  BNP (last 3 results)  Recent Labs  07/31/12 1426 08/04/12 0340  PROBNP 1950.0* 316.8*   CBG: No results found for this basename: GLUCAP,  in the last 168 hours  Radiological Exams on Admission: Dg Chest Portable 1 View  10/26/2012  *RADIOLOGY REPORT*  Clinical Data: Shortness of breath.  History of metastatic  breast cancer.  PORTABLE CHEST - 1 VIEW  Comparison: 10/08/2012 and chest CT 07/31/2012  Findings: Persistently very low lung volumes.  Extensive architectural distortion in the medial lungs and upper lobes bilaterally is unchanged.  There is diffuse bilateral airspace disease is similar to recent prior examinations.  Aeration in the medial right lung base appears worse than on today's chest radiograph compared to the chest radiograph of 10/08/2012.  Left costophrenic angle is blunted and probable chronic loculated pleural fluid in the upper hemithoraces bilaterally, left greater than right.  Irregular sclerotic area in the right scapula is consistent with bony metastatic disease.  There are multiple areas of sclerosis in the thoracic spine consistent with bony metastatic disease.  There is height loss of an upper to mid thoracic spine  vertebral body consistent with a compression deformity, as seen on prior CT.  IMPRESSION:  1.  Bilateral airspace disease, right worse than left, is similar compared to October 08, 2012.  Aeration at the right medial lung base is slightly worse on today's examination.  2.  Chronic, loculated appearing small bilateral pleural effusions bilaterally. 3.  Extensive bony metastatic disease.   Original Report Authenticated By: Britta Mccreedy, M.D.     EKG: Independently reviewed. Sinus tachycardia  Assessment/Plan Active Problems:  1. Respiratory distress - In context of patient with metastatic breast cancer with recent recommendation from oncologist for residential hospice last month. - continue bipap - transfer to palliative floor - place npo while sedated and or on bipap - palliative consult placed on computer will call next am - Respiratory care to manage bipap and titrate for effect. Pt DNR - If patient deteriorates would provide morphine and ativan for SOB/Anxiety - Chest x ray showing BL airspace disease R> L, Chronic loculated appearing small BL pleural effusions.   Therefore will continue lasix and antibiotics at this juncture - avoid fluids unless hypotensive if desired by family - albuterol and supplemental oxygen  2. Tachycardia - 2ary to # 1 - currently improved while on bipap - will monitor with routine vitals  3. Hypothyroidism 4. Depression/Panic attack 5. GERD: Protonix 6. Metastatic breast cancer: was on prednisone at home.  Will place on solumedrol. Antiemetics prn  For some of the problems listed above will hold oral medication while patient is npo.  Please refer to orders for details.  Code Status: DNR Family Communication: spoke to mother at bedside.  Disposition Plan: If patient survives would consider residential hospice.  Otherwise should respiratory condition deteriorate would continue comfort care measure and provide ativan and morphine for comfort.  Time spent: > 60 minutes  [name] [facility] Pager [phone]  If 7PM-7AM, please contact night-coverage [email or web address] Password [facility] 10/26/2012, 5:38 PM

## 2012-10-26 NOTE — ED Notes (Signed)
Per EMS--hx of lung CA, has used albuterol inhaler with no relief. Given Duoneb and Albuterol by EMS. She was 94% of RA on there arrival. She was given Solumedrol 125 mg IV in route at 1450

## 2012-10-26 NOTE — Progress Notes (Signed)
ANTIBIOTIC CONSULT NOTE - INITIAL  Pharmacy Consult for Zosyn Indication: BL pleural effusions  No Known Allergies  Patient Measurements:     Vital Signs: Temp: 99 F (37.2 C) (03/20 1538) Temp src: Oral (03/20 1538) BP: 140/97 mmHg (03/20 2000) Pulse Rate: 119 (03/20 2000) Intake/Output from previous day:   Intake/Output from this shift:    Labs:  Recent Labs  10/26/12 1640  WBC 7.6  HGB 10.1*  PLT 272  CREATININE 0.81   The CrCl is unknown because both a height and weight (above a minimum accepted value) are required for this calculation. No results found for this basename: VANCOTROUGH, VANCOPEAK, VANCORANDOM, GENTTROUGH, GENTPEAK, GENTRANDOM, TOBRATROUGH, TOBRAPEAK, TOBRARND, AMIKACINPEAK, AMIKACINTROU, AMIKACIN,  in the last 72 hours   Microbiology: Recent Results (from the past 720 hour(s))  CULTURE, BLOOD (ROUTINE X 2)     Status: None   Collection Time    10/02/12  2:11 PM      Result Value Range Status   Specimen Description BLOOD RIGHT ARM   Final   Special Requests BOTTLES DRAWN AEROBIC AND ANAEROBIC 5CC   Final   Culture  Setup Time 10/02/2012 22:16   Final   Culture NO GROWTH 5 DAYS   Final   Report Status 10/08/2012 FINAL   Final  CULTURE, BLOOD (ROUTINE X 2)     Status: None   Collection Time    10/02/12  2:41 PM      Result Value Range Status   Specimen Description BLOOD RIGHT HAND   Final   Special Requests BOTTLES DRAWN AEROBIC AND ANAEROBIC 8CC   Final   Culture  Setup Time 10/02/2012 22:16   Final   Culture NO GROWTH 5 DAYS   Final   Report Status 10/08/2012 FINAL   Final  CLOSTRIDIUM DIFFICILE BY PCR     Status: Abnormal   Collection Time    10/16/12  1:07 PM      Result Value Range Status   C difficile by pcr Positive (*) Negative Final   Comment:  This assay detects the presence of Clostridium difficile DNA codingfor toxin B (tcdB) by real-time polymerase chain reaction (PCR)amplification.This test was developed and its performance  characteristics have beendetermined by [employer].      Performance characteristics referto the analytical performance of the test. This test has not beencleared or approved by the [place] Food and Drug Administration. The FDAhas determined that such clearance or approval is not necessary. Thislaboratory is      certified under the Clinical Laboratory ImprovementAmendments of 1988 as qualified to perform high complexity clinicallaboratory testing.    Medical History: Past Medical History  Diagnosis Date  . Breast lump   . Thyroid disease   . Hypertension   . Hx of radiation therapy 06/2010, 10/2009, 11/2011    L breast, T3-T12 spine, L-S spine  . 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 Breast cancer 09/2009    L breast, ER+, PR-, Her 2 -  . History of radiation therapy 10/27/11-11/15/11    lumbar/sacral spine/18MV photons    Medications:  Anti-infectives   Start     Dose/Rate Route Frequency Ordered Stop   10/27/12 0100  piperacillin-tazobactam (ZOSYN) IVPB 3.375 g     3.375 g 12.5 mL/hr over 240 Minutes Intravenous Every 8 hours 10/26/12 2057     10/26/12 1615  piperacillin-tazobactam (ZOSYN) IVPB 3.375 g     3.375 g 100 mL/hr over 30 Minutes Intravenous  Once 10/26/12 1611  10/26/12 1959     Assessment: 75 yof w/ met breast cancer admitted 3/20 after presenting to ER w/ respiratory distress. CXR = BL airspace disease R>L and chronic loculated appearing small BL pleural effusions. Pharmacy asked to dose Zosyn. Pt received first Zosyn dose in ER ~ 1700. Scr wnl.   Goal of Therapy:  Appropriate dose of Zosyn  Plan:   Zosyn 3.375g IV q8h, each dose over 4 hours. First dose tomorrow 0100  Monitor labs vitals and cultures  Adjust dose as necessary  Gwen Her PharmD  [phone] 10/26/2012 9:01 PM

## 2012-10-26 NOTE — ED Notes (Signed)
Mother states that she found patient at home with respiratory difficulty.

## 2012-10-27 DIAGNOSIS — E039 Hypothyroidism, unspecified: Secondary | ICD-10-CM

## 2012-10-27 DIAGNOSIS — F411 Generalized anxiety disorder: Secondary | ICD-10-CM

## 2012-10-27 DIAGNOSIS — R0609 Other forms of dyspnea: Secondary | ICD-10-CM

## 2012-10-27 DIAGNOSIS — J9600 Acute respiratory failure, unspecified whether with hypoxia or hypercapnia: Secondary | ICD-10-CM

## 2012-10-27 DIAGNOSIS — J910 Malignant pleural effusion: Secondary | ICD-10-CM

## 2012-10-27 DIAGNOSIS — G8929 Other chronic pain: Secondary | ICD-10-CM

## 2012-10-27 MED ORDER — ALBUTEROL SULFATE (5 MG/ML) 0.5% IN NEBU
2.5000 mg | INHALATION_SOLUTION | RESPIRATORY_TRACT | Status: DC | PRN
  Administered 2012-10-27 – 2012-11-01 (×6): 2.5 mg via RESPIRATORY_TRACT
  Filled 2012-10-27 (×6): qty 0.5

## 2012-10-27 MED ORDER — MORPHINE SULFATE 2 MG/ML IJ SOLN
2.0000 mg | INTRAMUSCULAR | Status: DC | PRN
  Administered 2012-10-27: 1 mg via INTRAVENOUS
  Administered 2012-10-27 – 2012-10-28 (×9): 2 mg via INTRAVENOUS
  Filled 2012-10-27 (×10): qty 1

## 2012-10-27 MED ORDER — FUROSEMIDE 10 MG/ML IJ SOLN
40.0000 mg | Freq: Once | INTRAMUSCULAR | Status: AC
  Administered 2012-10-27: 40 mg via INTRAVENOUS

## 2012-10-27 NOTE — Progress Notes (Signed)
Patient WJ:[id number] Kristin [name]      DOB: May 15, 1966      NFA:[id number]   Called emergently to see patient for symptom management.  Patient states her goal is to "get better".  When asked what she remembers about what the doctors told her about her illness she relates that she has not been told anything.  She admitted that she does not recall the use of bipap last evening , but then states when told that she might not remember the conversations with the doctors.  Her response was I remember everything and maybe I just did not want to know.  Contacted Dr. Cena Benton .  Plan to transfer to ICU/ stepdown, use Bipap for comfort and address goals of care again with family who are on their way.   1150 to 1212 pm  [name] L. Ladona Ridgel, MD MBA The Palliative Medicine Team at [facility] Phone: [phone] Pager: [phone]'

## 2012-10-27 NOTE — ED Notes (Signed)
Pt has been unresponsive and was on BiPAP. Family member went to bible study before shift change and there was confusion on code status. Family members were not properly educated on treatment, because grandniece at bedside was still asking what was on her face (bipap). Vernona Rieger, [place] consulted with family and informed them of treatment. Family decided to have patient as a DNR and take her off of BiPap. Bipap was taken off at 2200. Pt was placed on nonrebreather for 15 minutes before arterial blood gas was taken. Pt was later placed on 4L on nasal cannula as directed via PA.   Dois Davenport [phone] family member (possibly daughter)

## 2012-10-27 NOTE — ED Notes (Signed)
Pt was ST on monitor.

## 2012-10-27 NOTE — Care Management (Signed)
CARE MANAGEMENT NOTE 10/27/2012  Patient:  Kristin [name], Kristin [name]   Account Number:  [id number]  Date Initiated:  10/27/2012  Documentation initiated by:  Heaven Wandell  Subjective/Objective Assessment:   47 yo female admitted with respiratory distress. Hx of Breast CA with METS.Recently made DNR     Action/Plan:   Anticipated DC Date:     Anticipated DC Plan:           Choice offered to / List presented to:             Status of service:   Medicare Important Message given?   (If response is "NO", the following Medicare IM given date fields will be blank) Date Medicare IM given:   Date Additional Medicare IM given:    Discharge Disposition:    Per UR Regulation:  Reviewed for med. necessity/level of care/duration of stay  If discussed at Long Length of Stay Meetings, dates discussed:    Comments:  10/27/12 1254 Topanga Alvelo,RN,BSN [phone]

## 2012-10-27 NOTE — Progress Notes (Addendum)
Room 1331 - Kristin [name] Thank you for consulting the Palliative Medicine Team at [facility] to meet your patient's and family's needs.  The reason that you asked [place] to see your patient is for symptom management and per admitting PA GOC to ensure everyone in family is on the same page to lessen the confusion regarding patient's wishes.   We have scheduled your patient for a meeting for GOC on Saturday 3/22 @ 2pm.    The Surrogate decision maker is patient's sister Kristin [name] [phone])   Other family members that need to be present: her mother Kristin [name] [phone] cell) and sister [name]. Your patient is able participate in any decisions.   Additional Narrative:  Pt is active with [facility] with diagnosis of metastatic breast cancer.  Pt has changed her mind and now wants to be a FULL code.  Staff RN and PMT MD arranging transfer.  [name] [employer].

## 2012-10-27 NOTE — Progress Notes (Signed)
Patient ZO:[id number] Rafalski      DOB: 09-14-1965      WUJ:[id number]  Caught up with patient's mother and sister this afternoon as she was being moved to the ICU.  Mom and sister were unable to stay to meet later in afternoon, stated they would come to the 2 pm meeting tomorrow.  I affirmed with them that they knew that Kristin [name] had changed her code status and could end up on the ventilator if her condition worsened.  They stated they would prefer to honor her wishes at this time even though they did not agree and they would meet with me and the patient tomorrow at 2 pm.  Kristin [name] L. Ladona Ridgel, MD MBA The Palliative Medicine Team at [facility] Phone: [phone] Pager: [phone]

## 2012-10-27 NOTE — Progress Notes (Signed)
[facility] PROGRESS NOTE  Kristin [name] ZOX:[id number] DOB: 11/18/1965 DOA: 10/26/2012 PCP: [name], MD  Assessment/Plan: 1. Respiratory distress - Will increase morphine dose given respiratory distress to 2 mg po q 2 hours - albuterol and supplemental oxygen  - Palliative care consult for symptom management and goals of care. - Patient at this point would like everything done to help her condition per her discussion with palliatve care.  Therefore will transfer to step down and place patient on Bipap  2. Tachycardia  - 2ary to # 1  - currently improved - will monitor with routine vitals   3. Hypothyroidism  - holding at this juncture due to tachypnea  4. Depression/Panic attack  - Ativan on board  5. GERD: Protonix   6. Metastatic breast cancer: was on prednisone at home. Will place on solumedrol. Antiemetics prn   Code Status: DNR  Family Communication: No family at bedside  Disposition Plan: Pending hospital course  Consultants:  Palliative care  Procedures:  none  Antibiotics:  Zosyn  HPI/Subjective: Patient still having shortness of breath and difficulty breathing.  Despite morphine nursing reports patient still looks uncomfortable and is sob.  Palliative has spoke with patient who would like everything done.   Objective: Filed Vitals:   10/27/12 0000 10/27/12 0030 10/27/12 0130 10/27/12 0547  BP: 152/107 140/85 157/88 151/97  Pulse: 115 112 101 110  Temp:   98 F (36.7 C) 98.5 F (36.9 C)  TempSrc:   Oral Oral  Resp: 23 26 20 18   Height:   6\' 2"  (1.88 m)   Weight:   60.147 kg (132 lb 9.6 oz)   SpO2: 99% 99% 100% 100%    Intake/Output Summary (Last 24 hours) at 10/27/12 1138 Last data filed at 10/27/12 0544  Gross per 24 hour  Intake 329.17 ml  Output   1300 ml  Net -970.83 ml   Filed Weights   10/27/12 0130  Weight: 60.147 kg (132 lb 9.6 oz)    Exam:   General:  Alert and Awake, laying in bed  Cardiovascular: S1  and S2 WNL, no murmurs  Respiratory: increased work of breathing, no wheezes. Course breath sounds R>L  Abdomen: soft, NT  Musculoskeletal: no cyanosis or clubbing   Data Reviewed: Basic Metabolic Panel:  Recent Labs Lab 10/26/12 1640  NA 138  K 3.4*  CL 97  CO2 33*  GLUCOSE 220*  BUN 8  CREATININE 0.81  CALCIUM 8.9   Liver Function Tests: No results found for this basename: AST, ALT, ALKPHOS, BILITOT, PROT, ALBUMIN,  in the last 168 hours No results found for this basename: LIPASE, AMYLASE,  in the last 168 hours No results found for this basename: AMMONIA,  in the last 168 hours CBC:  Recent Labs Lab 10/26/12 1640  WBC 7.6  NEUTROABS 7.1  HGB 10.1*  HCT 31.6*  MCV 79.0  PLT 272   Cardiac Enzymes: No results found for this basename: CKTOTAL, CKMB, CKMBINDEX, TROPONINI,  in the last 168 hours BNP (last 3 results)  Recent Labs  07/31/12 1426 08/04/12 0340  PROBNP 1950.0* 316.8*   CBG: No results found for this basename: GLUCAP,  in the last 168 hours  No results found for this or any previous visit (from the past 240 hour(s)).   Studies: Dg Chest Portable 1 View  10/26/2012  *RADIOLOGY REPORT*  Clinical Data: Shortness of breath.  History of metastatic breast cancer.  PORTABLE CHEST - 1 VIEW  Comparison: 10/08/2012 and chest  CT 07/31/2012  Findings: Persistently very low lung volumes.  Extensive architectural distortion in the medial lungs and upper lobes bilaterally is unchanged.  There is diffuse bilateral airspace disease is similar to recent prior examinations.  Aeration in the medial right lung base appears worse than on today's chest radiograph compared to the chest radiograph of 10/08/2012.  Left costophrenic angle is blunted and probable chronic loculated pleural fluid in the upper hemithoraces bilaterally, left greater than right.  Irregular sclerotic area in the right scapula is consistent with bony metastatic disease.  There are multiple areas of  sclerosis in the thoracic spine consistent with bony metastatic disease.  There is height loss of an upper to mid thoracic spine vertebral body consistent with a compression deformity, as seen on prior CT.  IMPRESSION:  1.  Bilateral airspace disease, right worse than left, is similar compared to October 08, 2012.  Aeration at the right medial lung base is slightly worse on today's examination.  2.  Chronic, loculated appearing small bilateral pleural effusions bilaterally. 3.  Extensive bony metastatic disease.   Original Report Authenticated By: Britta Mccreedy, M.D.     Scheduled Meds: . [START ON 10/28/2012] fentaNYL  50 mcg Transdermal Q72H  . furosemide  20 mg Intravenous Daily  . heparin  5,000 Units Subcutaneous Q8H  . methylPREDNISolone (SOLU-MEDROL) injection  40 mg Intravenous Daily  . pantoprazole (PROTONIX) IV  40 mg Intravenous Q24H  . piperacillin-tazobactam (ZOSYN)  IV  3.375 g Intravenous Q8H  . sodium chloride  3 mL Intravenous Q12H   Continuous Infusions: . albuterol 15 mg (10/26/12 1719)    Active Problems:   Malignant pleural effusion   Pulmonary infiltrates   Hypothyroidism   Panic attack   Lymphangitic lung metastasis   Respiratory distress    Time spent: > 35 minutes    [name]  [facility] Pager [phone] If 7PM-7AM, please contact night-coverage at [email or web address], password [facility] 10/27/2012, 11:38 AM  LOS: 1 day

## 2012-10-27 NOTE — Progress Notes (Addendum)
Room 1331 - Kristin [name] - [facility] RN Visit-R.Lakashia Collison RN  Related admission to [facility] diagnosis of metastatic breast cancer.   Pt has been a DNR code and during several visits today, has changed her mind and wants to be a full code.    Pt alert & oriented, sitting up in bed, with complaints of difficulty breathing. No family present at 1st visit, sister Kristin [name] present on subsequent visits.  Patient's home medication list is on shadow chart.   PMT consult for symptom management: pt being seen today @12noon  for respiratory distress.   Family meeting scheduled for tomorrow Sat 3/22 @2pm  for GOC to ensure all family members and pt are on the same page for patient's care.   Please call [facility] @ [phone]- ask for RN Liaison or after hours,ask for on-call RN with any hospice needs.   Thank you.  [name], RN  [facility]  Hospice Liaison

## 2012-10-27 NOTE — Progress Notes (Signed)
Patient moved from Palliative care to ICU with code status change from DNR to Full code. Placed on noninvasive PC. Will continue to monitor

## 2012-10-27 NOTE — Progress Notes (Signed)
Patient arrived to floor at around 0125 am from the ER.  Patient was calm and condition stable.  Patient requested pain medication and was given 1 mg morphine which helped her rest and relieved her pain.  At around 0300, Patient's sister Kristin [name] called.  With the patient's permission I spoke with her sister regarding her transfer to palliative care.  Patient's sister was not at all happy with this decision.  This situation involves many family dynamics.  Patient's sister Kristin [name] reported that she wanted her sister to "live" and that she believed her other family members were just giving up on her.  I told her that since the patient was alert and oriented it was up to her to decide (pt. wants comfort care) aswell as the HCPOA (which per the notes is her other sister).  She was very agitated and upset that her other family members were making these decisions.  Patient was educated on comfort care, what the term DNR means and the outcomes that could follow if the patient were to code, and the patient's condition.  She was also informed about the palliative care meeting to take place today, where she could express some of her concerns.  Explained this conversation to am RN who will follow up regarding family decisions.[name]

## 2012-10-28 ENCOUNTER — Inpatient Hospital Stay (HOSPITAL_COMMUNITY)

## 2012-10-28 DIAGNOSIS — R0989 Other specified symptoms and signs involving the circulatory and respiratory systems: Secondary | ICD-10-CM

## 2012-10-28 DIAGNOSIS — R0609 Other forms of dyspnea: Secondary | ICD-10-CM

## 2012-10-28 DIAGNOSIS — C50919 Malignant neoplasm of unspecified site of unspecified female breast: Secondary | ICD-10-CM

## 2012-10-28 DIAGNOSIS — F410 Panic disorder [episodic paroxysmal anxiety] without agoraphobia: Secondary | ICD-10-CM

## 2012-10-28 DIAGNOSIS — Z515 Encounter for palliative care: Secondary | ICD-10-CM

## 2012-10-28 DIAGNOSIS — C800 Disseminated malignant neoplasm, unspecified: Secondary | ICD-10-CM

## 2012-10-28 DIAGNOSIS — F411 Generalized anxiety disorder: Secondary | ICD-10-CM

## 2012-10-28 DIAGNOSIS — F329 Major depressive disorder, single episode, unspecified: Secondary | ICD-10-CM

## 2012-10-28 MED ORDER — HYDRALAZINE HCL 20 MG/ML IJ SOLN
10.0000 mg | Freq: Once | INTRAMUSCULAR | Status: AC
  Administered 2012-10-28: 10 mg via INTRAVENOUS
  Filled 2012-10-28: qty 1

## 2012-10-28 MED ORDER — LORAZEPAM 2 MG/ML IJ SOLN
1.0000 mg | Freq: Two times a day (BID) | INTRAMUSCULAR | Status: DC | PRN
  Administered 2012-10-28: 1 mg via INTRAVENOUS
  Filled 2012-10-28: qty 1

## 2012-10-28 MED ORDER — MORPHINE SULFATE 2 MG/ML IJ SOLN
2.0000 mg | INTRAMUSCULAR | Status: DC | PRN
  Administered 2012-10-28 – 2012-11-03 (×30): 2 mg via INTRAVENOUS
  Filled 2012-10-28 (×31): qty 1

## 2012-10-28 MED ORDER — KCL IN DEXTROSE-NACL 20-5-0.45 MEQ/L-%-% IV SOLN
INTRAVENOUS | Status: DC
  Administered 2012-10-28: 10:00:00 via INTRAVENOUS
  Filled 2012-10-28 (×2): qty 1000

## 2012-10-28 MED ORDER — LORAZEPAM 2 MG/ML IJ SOLN
1.0000 mg | Freq: Four times a day (QID) | INTRAMUSCULAR | Status: DC | PRN
  Administered 2012-10-30 – 2012-11-01 (×4): 1 mg via INTRAVENOUS
  Filled 2012-10-28 (×4): qty 1

## 2012-10-28 NOTE — Progress Notes (Signed)
Pt seen, asleep in no respiratory distress at this time.  Currently on 4lnc, hr 90, rr20, sats99%.  Bp 164/101.  Bipap remains in room on standby.  RT will continue to monitor pt.

## 2012-10-28 NOTE — Progress Notes (Signed)
Report rec'd from [employer].  Pt transferring by bed to rm [id number].

## 2012-10-28 NOTE — Consult Note (Signed)
Reason for Consult: Capacity evaluation Referring Physician: Dr. Arlyce Dice is an 47 y.o. female.  HPI: Patient was seen and chart reviewed. Patient sister and mother were at bed side with patient consent. She has been sad, low energy, unhappy regarding fighting with her malignancy. She has fairly good understanding with illness and treatment requirements and willing to follow as recommended by medical professionals. She has stated she does not want the "stupid cancer" which has changed her life of being caring mentally challenged people to by cared by medical people since 2011. Her family stats that she is smart and makes good decision for herself. Her mother and sister has been great support to her. She denied suicidal or homicidal ideations.  MSE: patient has depressed and sad mood and flat affect. She has decreased psychomotor activity. She has normal speech and thought process and has mild cognitive deficits, mostly associated being hospitalized and sedative medication. She has denied SI/Hi and no evidence of psychosis.  Past Medical History  Diagnosis Date  . Breast lump   . Thyroid disease   . Hypertension   . Hx of radiation therapy 06/2010, 10/2009, 11/2011    L breast, T3-T12 spine, L-S spine  . 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 Breast cancer 09/2009    L breast, ER+, PR-, Her 2 -  . History of radiation therapy 10/27/11-11/15/11    lumbar/sacral spine/18MV photons    Past Surgical History  Procedure Laterality Date  . Knee arthroscopy      left  . Breast surgery      left lumpectomy  . Knee cartilage surgery  2006    History reviewed. No pertinent family history.  Social History:  reports that she quit smoking about 5 years ago. She has never used smokeless tobacco. She reports that she does not drink alcohol or use illicit drugs.  Allergies: No Known Allergies  Medications: I have reviewed the patient's current medications.  Results for orders placed during  the hospital encounter of 10/26/12 (from the past 48 hour(s))  CBC WITH DIFFERENTIAL     Status: Abnormal   Collection Time    10/26/12  4:40 PM      Result Value Range   WBC 7.6  4.0 - 10.5 K/uL   RBC 4.00  3.87 - 5.11 MIL/uL   Hemoglobin 10.1 (*) 12.0 - 15.0 g/dL   HCT 21.3 (*) 08.6 - 57.8 %   MCV 79.0  78.0 - 100.0 fL   MCH 25.3 (*) 26.0 - 34.0 pg   MCHC 32.0  30.0 - 36.0 g/dL   RDW 46.9 (*) 62.9 - 52.8 %   Platelets 272  150 - 400 K/uL   Neutrophils Relative 93 (*) 43 - 77 %   Neutro Abs 7.1  1.7 - 7.7 K/uL   Lymphocytes Relative 5 (*) 12 - 46 %   Lymphs Abs 0.4 (*) 0.7 - 4.0 K/uL   Monocytes Relative 2 (*) 3 - 12 %   Monocytes Absolute 0.1  0.1 - 1.0 K/uL   Eosinophils Relative 0  0 - 5 %   Eosinophils Absolute 0.0  0.0 - 0.7 K/uL   Basophils Relative 0  0 - 1 %   Basophils Absolute 0.0  0.0 - 0.1 K/uL  BASIC METABOLIC PANEL     Status: Abnormal   Collection Time    10/26/12  4:40 PM      Result Value Range   Sodium 138  135 -  145 mEq/L   Potassium 3.4 (*) 3.5 - 5.1 mEq/L   Chloride 97  96 - 112 mEq/L   CO2 33 (*) 19 - 32 mEq/L   Glucose, Bld 220 (*) 70 - 99 mg/dL   BUN 8  6 - 23 mg/dL   Creatinine, Ser 1.61  0.50 - 1.10 mg/dL   Calcium 8.9  8.4 - 09.6 mg/dL   GFR calc non Af Amer 85 (*) >90 mL/min   GFR calc Af Amer >90  >90 mL/min   Comment:            The eGFR has been calculated     using the CKD EPI equation.     This calculation has not been     validated in all clinical     situations.     eGFR's persistently     <90 mL/min signify     possible Chronic Kidney Disease.  URINALYSIS, MICROSCOPIC ONLY     Status: Abnormal   Collection Time    10/26/12  5:12 PM      Result Value Range   Color, Urine YELLOW  YELLOW   APPearance CLEAR  CLEAR   Specific Gravity, Urine 1.015  1.005 - 1.030   pH 7.0  5.0 - 8.0   Glucose, UA NEGATIVE  NEGATIVE mg/dL   Hgb urine dipstick LARGE (*) NEGATIVE   Bilirubin Urine NEGATIVE  NEGATIVE   Ketones, ur NEGATIVE  NEGATIVE  mg/dL   Protein, ur NEGATIVE  NEGATIVE mg/dL   Urobilinogen, UA 0.2  0.0 - 1.0 mg/dL   Nitrite NEGATIVE  NEGATIVE   Leukocytes, UA NEGATIVE  NEGATIVE   RBC / HPF 3-6  <3 RBC/hpf   Bacteria, UA FEW (*) RARE   Squamous Epithelial / LPF FEW (*) RARE  BLOOD GAS, ARTERIAL     Status: Abnormal   Collection Time    10/26/12 10:13 PM      Result Value Range   FIO2 1.00     Delivery systems NON-REBREATHER OXYGEN MASK     pH, Arterial 7.435  7.350 - 7.450   pCO2 arterial 52.0 (*) 35.0 - 45.0 mmHg   pO2, Arterial 333.0 (*) 80.0 - 100.0 mmHg   Bicarbonate 34.3 (*) 20.0 - 24.0 mEq/L   TCO2 31.7  0 - 100 mmol/L   Acid-Base Excess 9.2 (*) 0.0 - 2.0 mmol/L   O2 Saturation 99.1     Patient temperature 98.6     Collection site RIGHT RADIAL     Drawn by [id number]     Sample type ARTERIAL DRAW     Allens test (pass/fail) PASS  PASS    Dg Chest Port 1 View  10/28/2012  *RADIOLOGY REPORT*  Clinical Data: Shortness of breath, weakness  PORTABLE CHEST - 1 VIEW  Comparison: 10/26/2012  Findings: Cardiomediastinal silhouette is stable.  There is worsening airspace disease bilateral upper lobe and right lower lobe.  Degenerative changes right shoulder again noted.  IMPRESSION: Worsening airspace disease bilateral upper lobe and right lower lobe.   Original Report Authenticated By: Natasha Mead, M.D.    Dg Chest Portable 1 View  10/26/2012  *RADIOLOGY REPORT*  Clinical Data: Shortness of breath.  History of metastatic breast cancer.  PORTABLE CHEST - 1 VIEW  Comparison: 10/08/2012 and chest CT 07/31/2012  Findings: Persistently very low lung volumes.  Extensive architectural distortion in the medial lungs and upper lobes bilaterally is unchanged.  There is diffuse bilateral airspace disease is similar to recent prior examinations.  Aeration in the medial right lung base appears worse than on today's chest radiograph compared to the chest radiograph of 10/08/2012.  Left costophrenic angle is blunted and probable  chronic loculated pleural fluid in the upper hemithoraces bilaterally, left greater than right.  Irregular sclerotic area in the right scapula is consistent with bony metastatic disease.  There are multiple areas of sclerosis in the thoracic spine consistent with bony metastatic disease.  There is height loss of an upper to mid thoracic spine vertebral body consistent with a compression deformity, as seen on prior CT.  IMPRESSION:  1.  Bilateral airspace disease, right worse than left, is similar compared to October 08, 2012.  Aeration at the right medial lung base is slightly worse on today's examination.  2.  Chronic, loculated appearing small bilateral pleural effusions bilaterally. 3.  Extensive bony metastatic disease.   Original Report Authenticated By: Britta Mccreedy, M.D.     Positive for anxiety and depression Blood pressure 145/92, pulse 122, temperature 99.2 F (37.3 C), temperature source Oral, resp. rate 24, height 6\' 2"  (1.88 m), weight 136 lb 11 oz (62 kg), SpO2 97.00%.   Assessment/Plan: Depression NOS Panic disorder Capacity evaluation  Recommendation: Patient has capacity to make medical decision as per psychiatric evaluation. She also has POA on medical care given to her sister and mother. Recommended no additional medication at this time.   Shaughnessy Gethers,JANARDHAHA R. 10/28/2012, 1:42 PM

## 2012-10-28 NOTE — Consult Note (Signed)
Patient Kristin [name]      DOB: 20-Mar-1966      UXL:[id number]     Consult Note from the Palliative Medicine Team at [facility]    Consult Requested by: Dr. Cena Benton     PCP: [name], MD Reason for Consultation:GOC, Sx rec     Phone Number:[phone]  Assessment of patients Current state: 47 year old african [place] female with known history of metastatic breast cancer- involving bone, liver and lung. Patient was admitted from home where she is cared for by her sister primarily and her mother when she was found to be more congested and altered mental status with INR greater than 6. She was admitted to the palliative Care unit for stabilization and end of life care with consideration for [facility] placement.  Yesterday when her breathing became more congested she stated that she wants to reverse her code status and desires to consider vent support.  She was transitioned to the ICU as it appeared that even though she is temporarily confused that she understood the consequences of her decision.  Her mother and Sister Dois Davenport arrived to say that she likely was being swayed by family members who did not have her best interests at heart but loved her none the less. She was sent to the ICU overnight for Bipap and medications to stabilize his condition.  She is slightly improved today.  She was able to verbalize answers to the psychiatrists questions. She has intermittent capacity depending on the level of pain medication and distress.  Today she stated to me" I made a mistake"  In the presence of her mother , sister (poa ) and son she stated she would not want to be reintubated or have her life prolonged with CPR.  She admitted that she is afraid of dying and she has difficulty talking about it.  She was able to agree to transition back to the PCU where she would be able to eat for comfort , use medications to control her symptoms   Goals of Care: 1.  Code Status: DNR/DNI no further  bipap   2. Scope of Treatment: Patient working through her mortality.  Austine agrees to no further bipap and understands that medications will be titrated to relieve her symptoms of dyspnea or pain. 4. Disposition: to be determined.  May consider transition to [facility] if she remains stable   3. Symptom Management:   1. Anxiety/Agitation: Change Ativan to 1 mg q 6 hrs prn .  Titrate as needed 2. Pain:Fentanyl patch in place,  Morphine 2 mg IV change to q 1 hour if she escalates her need for morphine will recommend starting morphine drip at 1 mg/hr with 2 mg boluses q 3o min. If two boluses needed in 60 min period, can titrate drip up by 1mg /hr max dose 10  mg 3. Nausea/vomiting: clear liquids. Prn zofran  4. Psychosocial: son age 57 lives in [place]  5. Spiritual: Christian faith,  Hospice chaplain to assist with care.        Patient Documents Completed or Given: Document Given Completed  Advanced Directives Pkt    MOST    DNR    Gone from [facility]    Hard Choices      Brief HPI: 47 yr old african [place] female with widely metastatic breast cancer.  Admitted with respiratory failure, and worsen cognition.  Oncology concurs that we are progressing on to her time of death.  Her mother and sister Dois Davenport [facility])  concur on comfort care , but [name] is having a hard time and has been encouraging her sister to fight by reversing her code status.  We were ask to assist with symptom management as goals of care.   ROS:  Mild shob, no pain, no current nausea  PMH:  Past Medical History  Diagnosis Date  . Breast lump   . Thyroid disease   . Hypertension   . Hx of radiation therapy 06/2010, 10/2009, 11/2011    L breast, T3-T12 spine, L-S spine  . 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 Breast cancer 09/2009    L breast, ER+, PR-, Her 2 -  . History of radiation therapy 10/27/11-11/15/11    lumbar/sacral spine/18MV photons     PSH: Past Surgical History  Procedure Laterality  Date  . Knee arthroscopy      left  . Breast surgery      left lumpectomy  . Knee cartilage surgery  2006   I have reviewed the FH and SH and  If appropriate update it with new information. No Known Allergies Scheduled Meds: . fentaNYL  50 mcg Transdermal Q72H  . furosemide  20 mg Intravenous Daily  . heparin  5,000 Units Subcutaneous Q8H  . methylPREDNISolone (SOLU-MEDROL) injection  40 mg Intravenous Daily  . pantoprazole (PROTONIX) IV  40 mg Intravenous Q24H  . piperacillin-tazobactam (ZOSYN)  IV  3.375 g Intravenous Q8H  . sodium chloride  3 mL Intravenous Q12H   Continuous Infusions: . albuterol 15 mg (10/26/12 1719)  . dextrose 5 % and 0.45 % NaCl with KCl 20 mEq/L 50 mL/hr at 10/28/12 0959   PRN Meds:.sodium chloride, albuterol, LORazepam, morphine injection, ondansetron (ZOFRAN) IV, ondansetron, sodium chloride    BP 115/81  Pulse 124  Temp(Src) 99.2 F (37.3 C) (Oral)  Resp 19  Ht 6\' 2"  (1.88 m)  Wt 62 kg (136 lb 11 oz)  BMI 17.54 kg/m2  SpO2 99%   PPS: 20%   Intake/Output Summary (Last 24 hours) at 10/28/12 1509 Last data filed at 10/28/12 1400  Gross per 24 hour  Intake 570.83 ml  Output   3575 ml  Net -3004.17 ml    Physical Exam:  General: less distress than yesterday, full range of emotions HEENT:  PERRL, EOMI, anicteric, MM dry Chest:   Bilateral course wet rhonchi, no wheezing , no accessory muscle use CVS: tachycardic, S1, S2 Abdomen:scaphoid soft, not tender Ext: very thin Neuro:awake alert and oriented to self and family  Labs: CBC    Component Value Date/Time   WBC 7.6 10/26/2012 1640   WBC 9.0 10/18/2012 1230   RBC 4.00 10/26/2012 1640   RBC 4.24 10/18/2012 1230   HGB 10.1* 10/26/2012 1640   HGB 10.8* 10/18/2012 1230   HCT 31.6* 10/26/2012 1640   HCT 33.2* 10/18/2012 1230   PLT 272 10/26/2012 1640   PLT 200 10/18/2012 1230   MCV 79.0 10/26/2012 1640   MCV 78.3* 10/18/2012 1230   MCH 25.3* 10/26/2012 1640   MCH 25.5 10/18/2012 1230    MCHC 32.0 10/26/2012 1640   MCHC 32.5 10/18/2012 1230   RDW 20.3* 10/26/2012 1640   RDW 19.9* 10/18/2012 1230   LYMPHSABS 0.4* 10/26/2012 1640   LYMPHSABS 0.5* 10/18/2012 1230   MONOABS 0.1 10/26/2012 1640   MONOABS 0.6 10/18/2012 1230   EOSABS 0.0 10/26/2012 1640   EOSABS 0.0 10/18/2012 1230   BASOSABS 0.0 10/26/2012 1640   BASOSABS 0.0 10/18/2012 1230  CMP     Component Value Date/Time   NA 138 10/26/2012 1640   NA 140 10/18/2012 1230   K 3.4* 10/26/2012 1640   K 3.1 Repeated and Verified* 10/18/2012 1230   CL 97 10/26/2012 1640   CL 97* 10/18/2012 1230   CO2 33* 10/26/2012 1640   CO2 30* 10/18/2012 1230   GLUCOSE 220* 10/26/2012 1640   GLUCOSE 159* 10/18/2012 1230   BUN 8 10/26/2012 1640   BUN 7.5 10/18/2012 1230   CREATININE 0.81 10/26/2012 1640   CREATININE 1.0 10/18/2012 1230   CALCIUM 8.9 10/26/2012 1640   CALCIUM 8.6 10/18/2012 1230   PROT 7.2 10/18/2012 1230   PROT 6.3 10/08/2012 1118   ALBUMIN 2.8* 10/18/2012 1230   ALBUMIN 2.4* 10/08/2012 1118   AST 13 10/18/2012 1230   AST 22 10/08/2012 1118   ALT 8 10/18/2012 1230   ALT 12 10/08/2012 1118   ALKPHOS 160* 10/18/2012 1230   ALKPHOS 175* 10/08/2012 1118   BILITOT 0.66 10/18/2012 1230   BILITOT 0.6 10/08/2012 1118   GFRNONAA 85* 10/26/2012 1640   GFRAA >90 10/26/2012 1640    Chest Xray Reviewed/Impressions: worsening bilateral airspace disease    Time In Time Out Total Time Spent with Patient Total Overall Time  200 pm  300 pm 40 min 60 min    Greater than 50%  of this time was spent counseling and coordinating care related to the above assessment and plan.  Domino Holten L. Ladona Ridgel, MD MBA The Palliative Medicine Team at [facility] Phone: [phone] Pager: [phone]

## 2012-10-28 NOTE — Progress Notes (Addendum)
[facility] PROGRESS NOTE  Kristin [name] QIH:[id number] DOB: Apr 20, 1966 DOA: 10/26/2012 PCP: [name], MD  Assessment/Plan: 1. Respiratory distress - albuterol and supplemental oxygen  - Palliative care consult for symptom management and goals of care.  - Patient at this point would like everything done to help her condition but patient is not oriented and appears confused about her condition.  POA Dois Davenport also feels like patient is confused.  Dois Davenport reports that it is her understanding that her breast cancer has metastasized to her lungs, liver, and bones.  Patient currently states that her cancer has only metastasized to her bones.    - At this point given my suspicion of patient not having the capacity to make her own medical decisions given confusion and lack of orientation will place consult to psychiatry for evaluation for medical capacity.    - This has been discussed with POA Dois Davenport who is agreeable.  - We will make further recommendations after meeting at  1400 and evaluation by psychiatrist.  It is my suspicion we will likely have to require the decisions from POA and they are currently more interested in comfort care measures.  2. Tachycardia  - 2ary to # 1  - currently improved - will monitor with routine vitals   3. Hypothyroidism  - holding at this juncture due to emesis and cpap needed prn.  4. Depression/Panic attack  - Ativan on board  5. GERD: Protonix   6. Metastatic breast cancer: was on prednisone at home. Will place on solumedrol. Antiemetics prn   Code Status: DNR  Family Communication: No family at bedside  Disposition Plan: Pending hospital course  Consultants:  Palliative care  Procedures:  none  Antibiotics:  Zosyn  HPI/Subjective: Patient still having shortness of breath and difficulty breathing.  Patient had emesis yesterday evening and required BIPAP as well was made NPO.   Objective: Filed Vitals:   10/28/12 0203  10/28/12 0250 10/28/12 0400 10/28/12 0800  BP: 160/93  139/87 145/92  Pulse:   104 122  Temp:   98.1 F (36.7 C) 98 F (36.7 C)  TempSrc:   Oral Oral  Resp:   31 24  Height:      Weight:      SpO2:  94% 96% 97%    Intake/Output Summary (Last 24 hours) at 10/28/12 1034 Last data filed at 10/28/12 0900  Gross per 24 hour  Intake    520 ml  Output   4425 ml  Net  -3905 ml   Filed Weights   10/27/12 0130 10/27/12 1530  Weight: 60.147 kg (132 lb 9.6 oz) 62 kg (136 lb 11 oz)    Exam:   General:  Alert and Awake, laying in bed addendum: oriented x1 to person not place or time.  Cardiovascular: S1 and S2 WNL increased rate, no murmurs  Respiratory: increased work of breathing improved from yesterday, no wheezes. Course breath sounds R>L  Abdomen: soft, NT  Musculoskeletal: no cyanosis or clubbing   Data Reviewed: Basic Metabolic Panel:  Recent Labs Lab 10/26/12 1640  NA 138  K 3.4*  CL 97  CO2 33*  GLUCOSE 220*  BUN 8  CREATININE 0.81  CALCIUM 8.9   Liver Function Tests: No results found for this basename: AST, ALT, ALKPHOS, BILITOT, PROT, ALBUMIN,  in the last 168 hours No results found for this basename: LIPASE, AMYLASE,  in the last 168 hours No results found for this basename: AMMONIA,  in the last 168 hours CBC:  Recent Labs Lab 10/26/12 1640  WBC 7.6  NEUTROABS 7.1  HGB 10.1*  HCT 31.6*  MCV 79.0  PLT 272   Cardiac Enzymes: No results found for this basename: CKTOTAL, CKMB, CKMBINDEX, TROPONINI,  in the last 168 hours BNP (last 3 results)  Recent Labs  07/31/12 1426 08/04/12 0340  PROBNP 1950.0* 316.8*   CBG: No results found for this basename: GLUCAP,  in the last 168 hours  No results found for this or any previous visit (from the past 240 hour(s)).   Studies: Dg Chest Port 1 View  10/28/2012  *RADIOLOGY REPORT*  Clinical Data: Shortness of breath, weakness  PORTABLE CHEST - 1 VIEW  Comparison: 10/26/2012  Findings:  Cardiomediastinal silhouette is stable.  There is worsening airspace disease bilateral upper lobe and right lower lobe.  Degenerative changes right shoulder again noted.  IMPRESSION: Worsening airspace disease bilateral upper lobe and right lower lobe.   Original Report Authenticated By: Natasha Mead, M.D.    Dg Chest Portable 1 View  10/26/2012  *RADIOLOGY REPORT*  Clinical Data: Shortness of breath.  History of metastatic breast cancer.  PORTABLE CHEST - 1 VIEW  Comparison: 10/08/2012 and chest CT 07/31/2012  Findings: Persistently very low lung volumes.  Extensive architectural distortion in the medial lungs and upper lobes bilaterally is unchanged.  There is diffuse bilateral airspace disease is similar to recent prior examinations.  Aeration in the medial right lung base appears worse than on today's chest radiograph compared to the chest radiograph of 10/08/2012.  Left costophrenic angle is blunted and probable chronic loculated pleural fluid in the upper hemithoraces bilaterally, left greater than right.  Irregular sclerotic area in the right scapula is consistent with bony metastatic disease.  There are multiple areas of sclerosis in the thoracic spine consistent with bony metastatic disease.  There is height loss of an upper to mid thoracic spine vertebral body consistent with a compression deformity, as seen on prior CT.  IMPRESSION:  1.  Bilateral airspace disease, right worse than left, is similar compared to October 08, 2012.  Aeration at the right medial lung base is slightly worse on today's examination.  2.  Chronic, loculated appearing small bilateral pleural effusions bilaterally. 3.  Extensive bony metastatic disease.   Original Report Authenticated By: Britta Mccreedy, M.D.     Scheduled Meds: . fentaNYL  50 mcg Transdermal Q72H  . furosemide  20 mg Intravenous Daily  . heparin  5,000 Units Subcutaneous Q8H  . methylPREDNISolone (SOLU-MEDROL) injection  40 mg Intravenous Daily  . pantoprazole  (PROTONIX) IV  40 mg Intravenous Q24H  . piperacillin-tazobactam (ZOSYN)  IV  3.375 g Intravenous Q8H  . sodium chloride  3 mL Intravenous Q12H   Continuous Infusions: . albuterol 15 mg (10/26/12 1719)  . dextrose 5 % and 0.45 % NaCl with KCl 20 mEq/L 50 mL/hr at 10/28/12 4098    Active Problems:   Malignant pleural effusion   Pulmonary infiltrates   Hypothyroidism   Panic attack   Lymphangitic lung metastasis   Respiratory distress    Time spent: > 35 minutes    [name]  [facility] Pager [phone] If 7PM-7AM, please contact night-coverage at [email or web address], password [facility] 10/28/2012, 10:34 AM  LOS: 2 days

## 2012-10-28 NOTE — Progress Notes (Signed)
Full Code.  Related admission.  Patient in the bed awake at times.  Denies any pain.  No family present at bedside.  Spoke with Nicholes Calamity, RN regarding patient condition and chart reviewed.  GOC meeting scheduled for 2 pm.  Please call [facility] [phone] with any questions or concerns.  Willette Pa, RN [facility]

## 2012-10-29 MED ORDER — LEVOTHYROXINE SODIUM 150 MCG PO TABS
150.0000 ug | ORAL_TABLET | Freq: Every day | ORAL | Status: DC
  Administered 2012-10-30 – 2012-11-03 (×5): 150 ug via ORAL
  Filled 2012-10-29 (×7): qty 1

## 2012-10-29 NOTE — Progress Notes (Addendum)
[facility] PROGRESS NOTE  Reilly Molchan UJW:[id number] DOB: 1965/08/18 DOA: 10/26/2012 PCP: [name], MD  Assessment/Plan: 1.Respiratory distress - continue albuterol, diuretics, steroids abx and supplemental oxygen  - appreciate Palliative care consult for symptom management and goals of care. -per goals of care pt wants to be DNR/DNI , no BIPAP,  And treat the treatable  - per psych she ahs the capacity to make decisions - will further discuss home vs BP in am 2. Tachycardia  - secondary to # 1  -  Improved/resolved currently - will monitor with routine vitals   3. Hypothyroidism  - resume synthroid 4. Depression/Panic attack  - continue Ativan   5. GERD: Protonix   6. Metastatic breast cancer: was on prednisone at home. on solumedrol, for now, follow Antiemetics prn   -Foley dc'ed  Code Status: DNR  Family Communication: No family at bedside  Disposition Plan: Pending hospital course  Consultants:  Palliative care  Procedures:  none  Antibiotics:  Zosyn  HPI/Subjective: Patient states her breathing is better, alert and appropriate. Requesting removal of her foley.   Objective: Filed Vitals:   10/28/12 0800 10/28/12 1200 10/28/12 1600 10/29/12 0456  BP: 145/92 115/81  136/81  Pulse: 122 124  99  Temp: 98 F (36.7 C) 99.2 F (37.3 C) 99.2 F (37.3 C)   TempSrc: Oral Oral Oral Axillary  Resp: 24 19  16   Height:      Weight:      SpO2: 97% 99%  100%    Intake/Output Summary (Last 24 hours) at 10/29/12 0950 Last data filed at 10/29/12 0454  Gross per 24 hour  Intake 300.83 ml  Output    825 ml  Net -524.17 ml   Filed Weights   10/27/12 0130 10/27/12 1530  Weight: 60.147 kg (132 lb 9.6 oz) 62 kg (136 lb 11 oz)    Exam:   General:  Alert and answers appropriately  Cardiovascular: S1 and S2 WNL increased rate, no murmurs  Respiratory:  Scattered rhonchi bil, no wheezes, respirations unlabored  Abdomen: soft, NT/ND, +BS,  no masses palpable  Musculoskeletal: no cyanosis or clubbing   Data Reviewed: Basic Metabolic Panel:  Recent Labs Lab 10/26/12 1640  NA 138  K 3.4*  CL 97  CO2 33*  GLUCOSE 220*  BUN 8  CREATININE 0.81  CALCIUM 8.9   Liver Function Tests: No results found for this basename: AST, ALT, ALKPHOS, BILITOT, PROT, ALBUMIN,  in the last 168 hours No results found for this basename: LIPASE, AMYLASE,  in the last 168 hours No results found for this basename: AMMONIA,  in the last 168 hours CBC:  Recent Labs Lab 10/26/12 1640  WBC 7.6  NEUTROABS 7.1  HGB 10.1*  HCT 31.6*  MCV 79.0  PLT 272   Cardiac Enzymes: No results found for this basename: CKTOTAL, CKMB, CKMBINDEX, TROPONINI,  in the last 168 hours BNP (last 3 results)  Recent Labs  07/31/12 1426 08/04/12 0340  PROBNP 1950.0* 316.8*   CBG: No results found for this basename: GLUCAP,  in the last 168 hours  No results found for this or any previous visit (from the past 240 hour(s)).   Studies: Dg Chest Port 1 View  10/28/2012  *RADIOLOGY REPORT*  Clinical Data: Shortness of breath, weakness  PORTABLE CHEST - 1 VIEW  Comparison: 10/26/2012  Findings: Cardiomediastinal silhouette is stable.  There is worsening airspace disease bilateral upper lobe and right lower lobe.  Degenerative changes right shoulder again noted.  IMPRESSION: Worsening airspace disease bilateral upper lobe and right lower lobe.   Original Report Authenticated By: Natasha Mead, M.D.     Scheduled Meds: . fentaNYL  50 mcg Transdermal Q72H  . furosemide  20 mg Intravenous Daily  . methylPREDNISolone (SOLU-MEDROL) injection  40 mg Intravenous Daily  . pantoprazole (PROTONIX) IV  40 mg Intravenous Q24H  . piperacillin-tazobactam (ZOSYN)  IV  3.375 g Intravenous Q8H  . sodium chloride  3 mL Intravenous Q12H   Continuous Infusions:    Active Problems:   Malignant pleural effusion   Pulmonary infiltrates   Hypothyroidism   Panic attack    Lymphangitic lung metastasis   Respiratory distress    Time spent:    Ulah Olmo C  [facility] Pager [phone] If 7PM-7AM, please contact night-coverage at [email or web address], password [facility] 10/29/2012, 9:50 AM  LOS: 3 days

## 2012-10-29 NOTE — Progress Notes (Signed)
Received call from pts sister, Elita Quick, wanting to know why pt was moved to Palliative Care.  Shayle was awake and indicated it was okay for me to speak with [name].  I told her that Dr Ladona Ridgel had met with Zynasia and other family members earlier in the day and Sonni had indicated that comfort care was her wish.  Encouraged [name] to discuss this further with pt/family during the day if she had further questions.

## 2012-10-29 NOTE — Progress Notes (Signed)
Patient [id number] Chastain      DOB: 1965/12/24      NFA:[id number]   Palliative Medicine Team at [facility] Progress Note    Subjective:  Patient remains very comfortable, less audible rhonchi without stethascope.  Feels that she needs to have a bowel movement but states she can't have a movement with the foley in place.  Patient's appetite increasing, she wants soup.  Filed Vitals:   10/29/12 0456  BP: 136/81  Pulse: 99  Temp:   Resp: 16   Physical exam:  General: slightly improved respiratory status PERRL, EOMI,  Not falling asleep as much this am.  Slept really well yesterday afternoon Chest with coarse wet rales R>L, no wheezing CVS; regular rate and rhythm, S1, S2 Abd: soft, not tender positive bowel sounds Ext: wasting Neuro:more awake today, oriented   Assessment and plan: 47 yr old african [place] female admitted with respiratory failure.  Slightly improved after aggressive diuresis, abx, and steroids.  Overall, ill patient torn between full comfort care vs treatment of treatable options.  Currently, patient has accepted DNR, treating the treatable.  Some improvement . She may survive this hospital stay .  Currently, asking to go home.  Goals might bring her in and out of hospital until process becomes irreversible.  1. DNR/ DNI  No further bipap  2. Respiratory Failure:  Continue diuretics,  Nebs, steroids, abx   3.  Agree with DC foley    4.  Anxiety: prn meds remain appropriate.  5.  Advance diet.   Prognosis: overall very poor with advanced metastatic breast cancer  Disposition: to be determined; BP is an option if patient is accepting of this,  Currently she wants to go home.  High risk for rehospitalization due to the nature of her symptom management needs.   Total time:   15 min   Ahlijah Raia L. Ladona Ridgel, MD MBA The Palliative Medicine Team at [facility] Phone: [phone] Pager: [phone]

## 2012-10-29 NOTE — Progress Notes (Signed)
DNR.  Related admission.  Patient transferred to PCU on Saturday.  Patient sitting up in the bed.  Denies any pain.  Has requested foley cath be removed and to have corn chowder to eat.  Cath to be removed this am and Katie, CNA was able to get chowder for the patient.  Chart reviewed and spoke with Raiford Noble, Charity fundraiser.  No concerns noted at this time.  Please call [facility] [phone] with any questions or concerns.  Willette Pa, RN [facility]

## 2012-10-29 NOTE — Progress Notes (Signed)
ANTIBIOTIC CONSULT NOTE - FOLLOW UP  Pharmacy Consult for Zosyn Indication: pneumonia  No Known Allergies  Patient Measurements: Height: 6\' 2"  (188 cm) Weight: 136 lb 11 oz (62 kg) IBW/kg (Calculated) : 77.7 Adjusted Body Weight:   Vital Signs: Temp src: Axillary (03/23 0456) BP: 136/81 mmHg (03/23 0456) Pulse Rate: 99 (03/23 0456) Intake/Output from previous day: 03/22 0701 - 03/23 0700 In: 320.8 [I.V.:270.8; IV Piggyback:50] Out: 825 [Urine:825] Intake/Output from this shift:    Labs:  Recent Labs  10/26/12 1640  WBC 7.6  HGB 10.1*  PLT 272  CREATININE 0.81   Estimated Creatinine Clearance: 84 ml/min (by C-G formula based on Cr of 0.81). No results found for this basename: VANCOTROUGH, VANCOPEAK, VANCORANDOM, GENTTROUGH, GENTPEAK, GENTRANDOM, TOBRATROUGH, TOBRAPEAK, TOBRARND, AMIKACINPEAK, AMIKACINTROU, AMIKACIN,  in the last 72 hours   Microbiology: Recent Results (from the past 720 hour(s))  CULTURE, BLOOD (ROUTINE X 2)     Status: None   Collection Time    10/02/12  2:11 PM      Result Value Range Status   Specimen Description BLOOD RIGHT ARM   Final   Special Requests BOTTLES DRAWN AEROBIC AND ANAEROBIC 5CC   Final   Culture  Setup Time 10/02/2012 22:16   Final   Culture NO GROWTH 5 DAYS   Final   Report Status 10/08/2012 FINAL   Final  CULTURE, BLOOD (ROUTINE X 2)     Status: None   Collection Time    10/02/12  2:41 PM      Result Value Range Status   Specimen Description BLOOD RIGHT HAND   Final   Special Requests BOTTLES DRAWN AEROBIC AND ANAEROBIC 8CC   Final   Culture  Setup Time 10/02/2012 22:16   Final   Culture NO GROWTH 5 DAYS   Final   Report Status 10/08/2012 FINAL   Final  CLOSTRIDIUM DIFFICILE BY PCR     Status: Abnormal   Collection Time    10/16/12  1:07 PM      Result Value Range Status   C difficile by pcr Positive (*) Negative Final   Comment:  This assay detects the presence of Clostridium difficile DNA codingfor toxin B (tcdB) by  real-time polymerase chain reaction (PCR)amplification.This test was developed and its performance characteristics have beendetermined by [employer].      Performance characteristics referto the analytical performance of the test. This test has not beencleared or approved by the [place] Food and Drug Administration. The FDAhas determined that such clearance or approval is not necessary. Thislaboratory is      certified under the Clinical Laboratory ImprovementAmendments of 1988 as qualified to perform high complexity clinicallaboratory testing.    Anti-infectives   Start     Dose/Rate Route Frequency Ordered Stop   10/27/12 0100  piperacillin-tazobactam (ZOSYN) IVPB 3.375 g     3.375 g 12.5 mL/hr over 240 Minutes Intravenous Every 8 hours 10/26/12 2057     10/26/12 1615  piperacillin-tazobactam (ZOSYN) IVPB 3.375 g     3.375 g 100 mL/hr over 30 Minutes Intravenous  Once 10/26/12 1611 10/26/12 1959      Assessment: 46 yof w/ met breast cancer on D#4 Zosyn for PNA. No new labs since 3/20 to assess renal function or WBC.  Last values WNL.  Pt remains afebrile.  No culture data.   Plan:  1.  Continue zosyn at current dose.  2.  F/u renal fxn/WBC when able, T, clinical course  Haynes Hoehn, PharmD 10/29/2012  12:19 PM  Pager: [phone]

## 2012-10-30 DIAGNOSIS — D649 Anemia, unspecified: Secondary | ICD-10-CM

## 2012-10-30 MED ORDER — FUROSEMIDE 20 MG PO TABS
20.0000 mg | ORAL_TABLET | Freq: Once | ORAL | Status: AC
  Administered 2012-10-30: 20 mg via ORAL
  Filled 2012-10-30: qty 1

## 2012-10-30 MED ORDER — FUROSEMIDE 40 MG PO TABS
40.0000 mg | ORAL_TABLET | Freq: Every day | ORAL | Status: DC
Start: 2012-10-31 — End: 2012-11-03
  Administered 2012-10-31 – 2012-11-03 (×4): 40 mg via ORAL
  Filled 2012-10-30 (×4): qty 1

## 2012-10-30 NOTE — Progress Notes (Signed)
Room 1328 - Rolla Etienne - [facility] RN Visit-R.Anarosa Kubisiak RN  Related admission to [facility] diagnosis of metastatic breast cancer.   Pt is DNR code (changed following PMT consult for [facility] Sat 3/22).    Pt alert & oriented, sitting up in chair.  Pt admits to 9/10 pain in back and refuses pain meds of any kind stating she is afraid she will go to sleep- discussed with staff RN.  Pt admitted she needs to be assisted back to bed - staff RN, NT and student RN's assisting with pt.  Over weekend, pt had issues with constipation - LBM listed now as 3/23.   No family present. Patient's home medication list is on shadow chart.   Pt states she wants to go home when it is time for discharge.  Latest PMT note routed to Dr. Darrall Dears in-basket.  Please call [facility] @ [phone]- ask for RN Liaison or after hours,ask for on-call RN with any hospice needs.   Thank you.  [name], RN  [facility]  Hospice Liaison

## 2012-10-30 NOTE — Progress Notes (Signed)
Pt sitting in chair eating breakfast.  Pt stating that she wants to return home once she is ready for discharge. Discussed [facility] as an option but pt is not interested in going there. Pt struggling with coping with illness.  When LCSW ask about how meeting with Palliative care was on Saturday, pt replied that it was "terrifying."  Pt is hopeful that She can be given "stronger" medications to use at home to assist with her breathing.  Wynonia Hazard, LCSW

## 2012-10-30 NOTE — Progress Notes (Signed)
[facility] PROGRESS NOTE  Kristin [name] ZOX:[id number] DOB: 08/20/1965 DOA: 10/26/2012 PCP: [name], MD  Assessment/Plan: 1.Respiratory distress - continue albuterol, diuretics, steroids abx and supplemental oxygen  - appreciate Palliative care consult for symptom management and goals of care. -per goals of care pt wants to be DNR/DNI , no BIPAP,  And treat the treatable  - per psych she ahs the capacity to make decisions - She does not want to go to [facility] place, would like to go home not comfortable doing so today -will increase Lasix to 40 and change to by mouth in a.m, follow-check Bmet in a.m. 2. Tachycardia  - secondary to # 1  -  Improved/resolved currently - will monitor with routine vitals   3. Hypothyroidism  - continue synthroid 4. Depression/Panic attack  - continue Ativan   5. GERD: Protonix   6. Metastatic breast cancer: was on prednisone at home. on solumedrol, for now, follow Antiemetics prn   -Foley dc'ed  Code Status: DNR  Family Communication: No family at bedside  Disposition Plan: wants to go home when improved  Consultants:  Palliative care  Procedures:  none  Antibiotics:  Zosyn  HPI/Subjective: Patient states breathing better than when she was admitted, but does not feel it is to where she can manage it at home   Objective: Filed Vitals:   10/29/12 0456 10/30/12 0519 10/30/12 0922 10/30/12 1100  BP: 136/81 138/80 140/95 138/92  Pulse: 99 97 90   Temp:  98.1 F (36.7 C) 97.8 F (36.6 C)   TempSrc: Axillary Oral Oral   Resp: 16 16 17    Height:      Weight:      SpO2: 100% 98% 100%     Intake/Output Summary (Last 24 hours) at 10/30/12 1302 Last data filed at 10/30/12 1130  Gross per 24 hour  Intake    140 ml  Output      5 ml  Net    135 ml   Filed Weights   10/27/12 0130 10/27/12 1530  Weight: 60.147 kg (132 lb 9.6 oz) 62 kg (136 lb 11 oz)    Exam:   General:  Alert and answers  appropriately  Cardiovascular: S1 and S2 WNL increased rate, no murmurs  Respiratory:   Right left greater than rhonchi present- decreasing, no wheezes, respirations unlabored  Abdomen: soft, NT/ND, +BS, no masses palpable  Musculoskeletal: no cyanosis or clubbing   Data Reviewed: Basic Metabolic Panel:  Recent Labs Lab 10/26/12 1640  NA 138  K 3.4*  CL 97  CO2 33*  GLUCOSE 220*  BUN 8  CREATININE 0.81  CALCIUM 8.9   Liver Function Tests: No results found for this basename: AST, ALT, ALKPHOS, BILITOT, PROT, ALBUMIN,  in the last 168 hours No results found for this basename: LIPASE, AMYLASE,  in the last 168 hours No results found for this basename: AMMONIA,  in the last 168 hours CBC:  Recent Labs Lab 10/26/12 1640  WBC 7.6  NEUTROABS 7.1  HGB 10.1*  HCT 31.6*  MCV 79.0  PLT 272   Cardiac Enzymes: No results found for this basename: CKTOTAL, CKMB, CKMBINDEX, TROPONINI,  in the last 168 hours BNP (last 3 results)  Recent Labs  07/31/12 1426 08/04/12 0340  PROBNP 1950.0* 316.8*   CBG: No results found for this basename: GLUCAP,  in the last 168 hours  No results found for this or any previous visit (from the past 240 hour(s)).   Studies: No results found.  Scheduled Meds: . fentaNYL  50 mcg Transdermal Q72H  . furosemide  20 mg Intravenous Daily  . levothyroxine  150 mcg Oral QAC breakfast  . methylPREDNISolone (SOLU-MEDROL) injection  40 mg Intravenous Daily  . pantoprazole (PROTONIX) IV  40 mg Intravenous Q24H  . piperacillin-tazobactam (ZOSYN)  IV  3.375 g Intravenous Q8H  . sodium chloride  3 mL Intravenous Q12H   Continuous Infusions:    Active Problems:   Malignant pleural effusion   Pulmonary infiltrates   Hypothyroidism   Panic attack   Lymphangitic lung metastasis   Respiratory distress    Time spent:    Kristin [name] C  [facility] Pager [phone] If 7PM-7AM, please contact night-coverage at [email or web address],  password [facility] 10/30/2012, 1:02 PM  LOS: 4 days

## 2012-10-31 DIAGNOSIS — R918 Other nonspecific abnormal finding of lung field: Secondary | ICD-10-CM

## 2012-10-31 LAB — BASIC METABOLIC PANEL
CO2: 34 mEq/L — ABNORMAL HIGH (ref 19–32)
Calcium: 8.8 mg/dL (ref 8.4–10.5)
Creatinine, Ser: 1.01 mg/dL (ref 0.50–1.10)
GFR calc Af Amer: 76 mL/min — ABNORMAL LOW (ref >90)
GFR calc non Af Amer: 65 mL/min — ABNORMAL LOW (ref >90)
Sodium: 137 mEq/L (ref 135–145)

## 2012-10-31 MED ORDER — PANTOPRAZOLE SODIUM 40 MG PO TBEC
40.0000 mg | DELAYED_RELEASE_TABLET | Freq: Every day | ORAL | Status: DC
  Administered 2012-10-31 – 2012-11-02 (×3): 40 mg via ORAL
  Filled 2012-10-31 (×4): qty 1

## 2012-10-31 NOTE — Progress Notes (Addendum)
Room 1328 - Kristin [name] - [facility] RN Visit-R.Mace Weinberg RN  Related admission to [facility] diagnosis of metastatic breast cancer.   Pt is DNR code.    Pt arousable, admits to being down compared to yesterday.  Pt states she hasn't slept and agreed to asking RN for meds to sleep - discussed with staff RN Kristin [name] who states in report pt given Ativan last pm and slept through the night to 9am this am.  Pt sitting up in bed, denies any complaints of pain or discomfort.    No family present. Patient's home medication list is on shadow chart.   Pt asking to go home yesterday - today pt's mother called [facility] SW and advised pt cannot come home as they are unable to care for her - [facility] SW advised pt's mother to come and talk with patient about discharge situation. Per conversation with Dr Ladona Ridgel, mother also called her and she gave same advice - family to come discuss with [name].  Advised staff RN Kristin [name] of change in disposition plans.   Please call [facility] @ [phone]- ask for RN Liaison or after hours,ask for on-call RN with any hospice needs.   Thank you.  Kristin Boers, RN  [facility]  Hospice Liaison

## 2012-10-31 NOTE — Progress Notes (Signed)
Patient [name]      DOB: Apr 02, 1966      BMW:[id number]  Patient medicated, sleeping heavily.  No family at the bedside.  Will need to arrange official meeting with Hospice SW to assist with disposition issue.  Philis Doke L. Ladona Ridgel, MD MBA The Palliative Medicine Team at [facility] Phone: [phone] Pager: [phone]

## 2012-10-31 NOTE — Progress Notes (Signed)
This patient is receiving IV Protonix. Based on criteria approved by the Pharmacy and Therapeutics Committee, this medication is being converted to the equivalent oral dose form. These criteria include:   . The patient is eating (either orally or per tube) and/or has been taking other orally administered medications for at least 24 hours.  . This patient has no evidence of active gastrointestinal bleeding or impaired GI absorption (gastrectomy, short bowel, patient on TNA or NPO).   If you have questions about this conversion, please contact the pharmacy department.  Berkley Harvey, [facility] 10/31/2012 10:41 AM

## 2012-10-31 NOTE — Progress Notes (Signed)
[facility] PROGRESS NOTE  Kristin [name] ZOX:[id number] DOB: [birth year] DOA: 10/26/2012 PCP: [name], MD  Assessment/Plan: 1.Respiratory distress - continue albuterol, diuretics, steroids abx and supplemental oxygen  - appreciate Palliative care consult for symptom management and goals of care. -per goals of care pt wants to be DNR/DNI , no BIPAP,  And treat the treatable  - per psych she ahs the capacity to make decisions - She does not want to go to [facility] place, would like to go home once near baseline.  Patient nearing baseline. -will continue with Lasix to 40 daily and check BMP next am.  2. Tachycardia  - secondary to # 1  -  Improved/resolved currently - will monitor with routine vitals   3. Hypothyroidism  - continue synthroid  4. Depression/Panic attack  - continue Ativan   5. GERD: Protonix   6. Metastatic breast cancer: was on prednisone at home. on solumedrol, for now, follow Antiemetics prn   -Foley dc'ed  Code Status: DNR  Family Communication: No family at bedside  Disposition Plan: wants to go home when improved. Once again does not want [facility]. Likely d/c home in 1-2 days.  Consultants:  Palliative care  Procedures:  none  Antibiotics:  Zosyn  HPI/Subjective: Patient breathing better today. No new complaints.  Does not feel comfortable going home today. States she is not quite at baseline.   Objective: Filed Vitals:   10/30/12 0922 10/30/12 1100 10/31/12 0614 10/31/12 1004  BP: 140/95 138/92 138/92   Pulse: 90  100   Temp: 97.8 F (36.6 C)  97.8 F (36.6 C)   TempSrc: Oral  Oral   Resp: 17  16   Height:      Weight:      SpO2: 100%  100% 92%    Intake/Output Summary (Last 24 hours) at 10/31/12 1903 Last data filed at 10/31/12 1700  Gross per 24 hour  Intake    840 ml  Output    500 ml  Net    340 ml   Filed Weights   10/27/12 0130 10/27/12 1530  Weight: 60.147 kg (132 lb 9.6 oz) 62 kg (136 lb 11 oz)     Exam:   General:  Alert and answers appropriately  Cardiovascular: S1 and S2 WNL increased rate, no murmurs  Respiratory:   Right left greater than rhonchi present, no wheezes, respirations unlabored  Abdomen: soft, NT/ND, +BS, no masses palpable  Musculoskeletal: no cyanosis or clubbing   Data Reviewed: Basic Metabolic Panel:  Recent Labs Lab 10/26/12 1640 10/31/12 0355  NA 138 137  K 3.4* 3.4*  CL 97 95*  CO2 33* 34*  GLUCOSE 220* 95  BUN 8 12  CREATININE 0.81 1.01  CALCIUM 8.9 8.8   Liver Function Tests: No results found for this basename: AST, ALT, ALKPHOS, BILITOT, PROT, ALBUMIN,  in the last 168 hours No results found for this basename: LIPASE, AMYLASE,  in the last 168 hours No results found for this basename: AMMONIA,  in the last 168 hours CBC:  Recent Labs Lab 10/26/12 1640  WBC 7.6  NEUTROABS 7.1  HGB 10.1*  HCT 31.6*  MCV 79.0  PLT 272   Cardiac Enzymes: No results found for this basename: CKTOTAL, CKMB, CKMBINDEX, TROPONINI,  in the last 168 hours BNP (last 3 results)  Recent Labs  07/31/12 1426 08/04/12 0340  PROBNP 1950.0* 316.8*   CBG: No results found for this basename: GLUCAP,  in the last 168 hours  No  results found for this or any previous visit (from the past 240 hour(s)).   Studies: No results found.  Scheduled Meds: . fentaNYL  50 mcg Transdermal Q72H  . furosemide  40 mg Oral Daily  . levothyroxine  150 mcg Oral QAC breakfast  . methylPREDNISolone (SOLU-MEDROL) injection  40 mg Intravenous Daily  . pantoprazole  40 mg Oral QHS  . piperacillin-tazobactam (ZOSYN)  IV  3.375 g Intravenous Q8H  . sodium chloride  3 mL Intravenous Q12H   Continuous Infusions:    Active Problems:   Malignant pleural effusion   Pulmonary infiltrates   Hypothyroidism   Panic attack   Lymphangitic lung metastasis   Respiratory distress    Time spent:  30 minutes    [name]  [facility] Pager [phone] If  7PM-7AM, please contact night-coverage at [email or web address], password [facility] 10/31/2012, 7:03 PM  LOS: 5 days

## 2012-11-01 MED ORDER — LORAZEPAM 2 MG/ML IJ SOLN
1.0000 mg | INTRAMUSCULAR | Status: DC | PRN
  Administered 2012-11-01 – 2012-11-03 (×6): 1 mg via INTRAVENOUS
  Filled 2012-11-01 (×6): qty 1

## 2012-11-01 MED ORDER — DEXAMETHASONE SODIUM PHOSPHATE 10 MG/ML IJ SOLN
8.0000 mg | Freq: Two times a day (BID) | INTRAMUSCULAR | Status: DC
  Administered 2012-11-01 – 2012-11-02 (×2): 8 mg via INTRAVENOUS
  Filled 2012-11-01 (×3): qty 0.8

## 2012-11-01 MED ORDER — SODIUM CHLORIDE 0.9 % IV SOLN
90.0000 mg | Freq: Once | INTRAVENOUS | Status: AC
  Administered 2012-11-01: 90 mg via INTRAVENOUS
  Filled 2012-11-01: qty 10

## 2012-11-01 MED ORDER — KETOROLAC TROMETHAMINE 15 MG/ML IJ SOLN
15.0000 mg | Freq: Four times a day (QID) | INTRAMUSCULAR | Status: DC
  Administered 2012-11-01 – 2012-11-02 (×4): 15 mg via INTRAVENOUS
  Filled 2012-11-01 (×7): qty 1

## 2012-11-01 NOTE — Progress Notes (Signed)
ANTIBIOTIC CONSULT NOTE - FOLLOW UP  Pharmacy Consult for Zosyn Indication: pneumonia  No Known Allergies  Patient Measurements: Height: 6\' 2"  (188 cm) Weight: 136 lb 11 oz (62 kg) IBW/kg (Calculated) : 77.7 Adjusted Body Weight:   Vital Signs: Temp: 97.7 F (36.5 C) (03/26 0559) Temp src: Oral (03/26 0559) BP: 138/78 mmHg (03/26 0601) Pulse Rate: 101 (03/26 0559) Intake/Output from previous day: 03/25 0701 - 03/26 0700 In: 720 [P.O.:720] Out: 350 [Urine:350] Intake/Output from this shift:    Labs:  Recent Labs  10/31/12 0355  CREATININE 1.01   Estimated Creatinine Clearance: 67.4 ml/min (by C-G formula based on Cr of 1.01). No results found for this basename: VANCOTROUGH, VANCOPEAK, VANCORANDOM, GENTTROUGH, GENTPEAK, GENTRANDOM, TOBRATROUGH, TOBRAPEAK, TOBRARND, AMIKACINPEAK, AMIKACINTROU, AMIKACIN,  in the last 72 hours   Microbiology: Recent Results (from the past 720 hour(s))  CULTURE, BLOOD (ROUTINE X 2)     Status: None   Collection Time    10/02/12  2:11 PM      Result Value Range Status   Specimen Description BLOOD RIGHT ARM   Final   Special Requests BOTTLES DRAWN AEROBIC AND ANAEROBIC 5CC   Final   Culture  Setup Time 10/02/2012 22:16   Final   Culture NO GROWTH 5 DAYS   Final   Report Status 10/08/2012 FINAL   Final  CULTURE, BLOOD (ROUTINE X 2)     Status: None   Collection Time    10/02/12  2:41 PM      Result Value Range Status   Specimen Description BLOOD RIGHT HAND   Final   Special Requests BOTTLES DRAWN AEROBIC AND ANAEROBIC 8CC   Final   Culture  Setup Time 10/02/2012 22:16   Final   Culture NO GROWTH 5 DAYS   Final   Report Status 10/08/2012 FINAL   Final  CLOSTRIDIUM DIFFICILE BY PCR     Status: Abnormal   Collection Time    10/16/12  1:07 PM      Result Value Range Status   C difficile by pcr Positive (*) Negative Final   Comment:  This assay detects the presence of Clostridium difficile DNA codingfor toxin B (tcdB) by real-time  polymerase chain reaction (PCR)amplification.This test was developed and its performance characteristics have beendetermined by [employer].      Performance characteristics referto the analytical performance of the test. This test has not beencleared or approved by the [place] Food and Drug Administration. The FDAhas determined that such clearance or approval is not necessary. Thislaboratory is      certified under the Clinical Laboratory ImprovementAmendments of 1988 as qualified to perform high complexity clinicallaboratory testing.    Anti-infectives   Start     Dose/Rate Route Frequency Ordered Stop   10/27/12 0100  piperacillin-tazobactam (ZOSYN) IVPB 3.375 g     3.375 g 12.5 mL/hr over 240 Minutes Intravenous Every 8 hours 10/26/12 2057     10/26/12 1615  piperacillin-tazobactam (ZOSYN) IVPB 3.375 g     3.375 g 100 mL/hr over 30 Minutes Intravenous  Once 10/26/12 1611 10/26/12 1959      Assessment: 46 yof w/ met breast cancer on D#7 Zosyn for PNA.  Scr stable, dose appropriate Plan:  Continue zosyn at current dose.  Will sign off, reconsult as necessary  Gwen Her PharmD  [phone] 11/01/2012 8:47 AM

## 2012-11-01 NOTE — Progress Notes (Signed)
Room 1328 - Kristin [name] - [facility] RN Visit-[name] RN  Related admission to [facility] diagnosis of metastatic breast caner.  Pt is DNR code. Pt alert & oriented, sitting up in bed, eating [employer] hamburger.  Pt has no complaints of pain or discomfort at time of visit-admits to 2 episodes of SOB-this am,  one just after getting up to [facility] and pt complained of anxiety but was SOB with rhonchi per staff RN Noreene Larsson who administered Ativan for pt.   Mother and [name] present.   Discussed discharge with pt and she adamantly states she is going home - son [name] now in town and will assist with care.  Attempted to educate pt on situations of SOB at home, positions, limiting activities,calling [facility] RN, not 911 with each episode. Pt appears to understand.     Pt's mother spoke outside the room, very troubled, stating pt wants to go home to smoke, states pt takes her meds "when she wants to" and not always when they are ordered, then she gets into trouble & calls 911.  Mother states she is unsure she and [name] can care for pt at home any longer.  Set up meeting with mother, pt, [facility] SW Beth and PMT Dr. Phillips Odor today at 3:45pm to review goals and clarify care at discharge.   Please call [facility] @ [phone]- ask for RN Liaison or after hours,ask for on-call RN with any hospice needs.   Thank you.  [name], RN  [facility]  Hospice Liaison

## 2012-11-01 NOTE — Progress Notes (Addendum)
[facility] PROGRESS NOTE  Leahann Lempke HYQ:[id number] DOB: 1966-02-21 DOA: 10/26/2012 PCP: [name], MD  Brief narrative: 47 year old woman with a past medical history of stage IV breast cancer with metastasis to liver and bone who was admitted to the hospital on 10/26/12 due to worsening shortness of breath.  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 Continue nebulizer treatments, albuterol every 4 hours as needed Continue Solu-Medrol 40 mg IV daily Continue current antibiotic regimen for pneumonia Appreciate palliative care team following. Patient is residential hospice appropriate however has declined placement to residential hospice.   Active Problems:  Stage IV breast cancer  Under hospice care Chronic cancer related pain  Continue fentanyl patch 50 mcg every 72 hours. Continue morphine 2 mg IV every one hour as needed. Anemia of chronic disease  Secondary to history of malignancy. Hypothyroidism  Continue Synthroid. Anxiety/panic attack  Ativan PRN  Code Status: DNR Family Communication: mother at bedside Disposition Plan: home with hospice when stable  Consultants:  Palliative care Procedures:  none Antibiotics:  Yetta Barre, MD  [facility] Pager [phone]  If 7PM-7AM, please contact night-coverage [email or web address] Password [facility] 11/01/2012, 1:06 PM   LOS: 6 days    HPI/Subjective: No acute overnight events.  Objective: Filed Vitals:   10/31/12 0614 10/31/12 1004 11/01/12 0559 11/01/12 0601  BP: 138/92  152/108 138/78  Pulse: 100  101   Temp: 97.8 F (36.6 C)  97.7 F (36.5 C)   TempSrc: Oral  Oral   Resp: 16  14   Height:      Weight:      SpO2: 100% 92% 100%     Intake/Output Summary (Last 24 hours) at 11/01/12 1306 Last data filed at 11/01/12  0950  Gross per 24 hour  Intake    360 ml  Output    400 ml  Net    -40 ml    Exam:   General:  Pt is awake,not in acute distress  Cardiovascular: Regular rate and rhythm, S1/S2 appreciated  Respiratory: Diminished breath sounds, poor respiratory effort by patient, rhonchorous  Abdomen: Soft, non tender, distended, bowel sounds present, no guarding  Extremities: No edema, pulses DP and PT palpable bilaterally  Neuro: Grossly nonfocal  Data Reviewed: Basic Metabolic Panel:  Recent Labs Lab 10/26/12 1640 10/31/12 0355  NA 138 137  K 3.4* 3.4*  CL 97 95*  CO2 33* 34*  GLUCOSE 220* 95  BUN 8 12  CREATININE 0.81 1.01  CALCIUM 8.9 8.8   CBC:  Recent Labs Lab 10/26/12 1640  WBC 7.6  NEUTROABS 7.1  HGB 10.1*  HCT 31.6*  MCV 79.0  PLT 272    Studies: No results found.  Scheduled Meds: . fentaNYL  50 mcg Transdermal Q72H  . furosemide  40 mg Oral Daily  . levothyroxine  150 mcg Oral QAC breakfast  . methylPREDNISolone   40 mg Intravenous Daily  . pantoprazole  40 mg Oral QHS  . piperacillin-tazobactam (ZOSYN)  IV  3.375 g Intravenous Q8H

## 2012-11-01 NOTE — Progress Notes (Signed)
Pt's mother and sister are stating that they do not want pt to come back home.  They feel that they cannot continue to provide care for her.  Pt reluctant to go to [facility] but is agreeable to do so for symptom management.  Contacted Heather at [facility] and a bed will be available on Friday.

## 2012-11-01 NOTE — Progress Notes (Signed)
We have scheduled your patient for a follow up meeting: Wed 3/26 @ 3:45pm   The Surrogate decision make ZO:[id number] sister Dois Davenport [facility]) [phone] to be present.  Other family members that need to be present: Patient's mother Jamesetta So will be present.  Your patient is able to participate:   Additional Narrative:Clarify care at home & goals in preparation for discharge.  Pt requests going to her home (rented in her name) and mother and [name] do not feel they are able to care for pt at home.  Requested family discuss with patient but has not done so to this date.  PMT will attempt to clarify care/goals.   [name] RN PMT & [facility] Liaison [phone] c

## 2012-11-01 NOTE — Progress Notes (Addendum)
Palliative Medicine Team Progress Note   Asked by [facility] RN Liason [name], RN to see patient regarding complex symptom management needs and psychosocial issues regarding disposition.   Present for meeting: Wynonia Hazard, CSW [facility] ; [facility] Volunteer; Elesa Hacker Friend; Mother; Sister on phone.  Patient is 47 yo dying of advanced, widely metastatic breast cancer. Multiple complex issues with grief, anger, self esteem, life's worth, denial, loss of her independence, regrets, guilt, and struggling with feeling like doctors and healthcare providers are giving up on her. She associates chemotherapy and radiation as "doing something" and therefore if she can tolerate it it must be valuable-feels it was "taken away from her". I helped her re-frame that negative thought in context of perhaps it may have shortened her life and that her body may live longer in its more natural state with good pain and symptom control. She complains of intense SOB, and it must be a major issue for her despite her Stoism and it is more than just traditional dyspnea- she is asking for help with this and frustrated-her sister and mother blame it on her smoking- but that is probably the least of it considering the way her CXR looks with mets and effusion.  The major issue in the room and one being worked on with home care was her inability to care for herself at home adequately and that her motehr and sister can no longer take on that burden of care. There was a heated exchanged between patient's mother, Myranda and her sister-they are all in crisis, angry with each other over her home care and at the end of the day it is probably because they are frightened and scared of the future-they also have to work. Glora feels like they want her to go to [facility] to "get out of their hair". Very complex issues that will require the expertise and guidance of Hospice Team.  Active Problems: 1. Advanced Metastatic Breast Cancer complicated by  pulmonary emboli, pulm mets, tumor obstruction, osseous mets, bone pain- no longer candidate for chemo or rads.  2. Dyspnea, related to PE, Mets, Obstruction  3. Pain, predominantly bone pain from mets, inflammatory predominant  4. Anxiety, Grief- complex, provided psychosocial support, worse with air hunger  5. Cancer related fatigue  6. Failure to Thrive  I am recommending [facility] from a symptom management standpoint eve if temporary and she goes back home, she has 9/10 pain, especially on her left scapula region over bony mets, she is very stoic with pain largely under-treated in my opinion. She also has obstructive dyspnea from pulmonary mets- this is very distressing for her-associated with air hunger.  1. Patient is considering going to [facility] for Symptom Management, possibly for terminal care depending on how she does.  2. She has largely untreated bone pain from mets- will schedule toradol, bisphosphonate, and BID decadron for bone pain/diffuse osseous disease.  3. For her cancer related fatigue could do a trial of Ritalin, she may do well with this for energy and depression, but consider after she gets bone pain combo so side effects can be evaluated.  4. Schedule Nebs and benzodiazapine low dose for anxiety and air hunger.   Will follow-up tomorrow on symptoms and d/c planning.  Anderson Malta, DO Palliative Medicine  Time: 50 minutes   Anderson Malta, DO Palliative Medicine

## 2012-11-01 NOTE — Progress Notes (Signed)
One of our spiritual care volunteers came by to see Kristin [name] because she had visited with her before and they had had good visits. I also have visited with her. Patient was appreciative of the visit and asked for devotional reading material, but did not remember that either of [place] had visited her. Volunteer brought her the materials requested.

## 2012-11-02 MED ORDER — MORPHINE SULFATE ER 60 MG PO TBCR: 60.0000 mg | EXTENDED_RELEASE_TABLET | Freq: Two times a day (BID) | ORAL | Status: AC

## 2012-11-02 MED ORDER — IPRATROPIUM BROMIDE 0.02 % IN SOLN
0.5000 mg | RESPIRATORY_TRACT | Status: DC
  Administered 2012-11-02 – 2012-11-03 (×6): 0.5 mg via RESPIRATORY_TRACT
  Filled 2012-11-02 (×6): qty 2.5

## 2012-11-02 MED ORDER — ALBUTEROL SULFATE (5 MG/ML) 0.5% IN NEBU
2.5000 mg | INHALATION_SOLUTION | RESPIRATORY_TRACT | Status: DC
  Administered 2012-11-02 – 2012-11-03 (×6): 2.5 mg via RESPIRATORY_TRACT
  Filled 2012-11-02 (×6): qty 0.5

## 2012-11-02 MED ORDER — ALPRAZOLAM 0.5 MG PO TABS: 0.5000 mg | ORAL_TABLET | Freq: Two times a day (BID) | ORAL | Status: AC

## 2012-11-02 MED ORDER — ALBUTEROL SULFATE (5 MG/ML) 0.5% IN NEBU: 2.5000 mg | INHALATION_SOLUTION | RESPIRATORY_TRACT | Status: AC

## 2012-11-02 MED ORDER — DEXAMETHASONE 2 MG PO TABS: 10.0000 mg | ORAL_TABLET | Freq: Two times a day (BID) | ORAL | Status: AC

## 2012-11-02 MED ORDER — FENTANYL 50 MCG/HR TD PT72: 1.0000 | MEDICATED_PATCH | TRANSDERMAL | Status: AC

## 2012-11-02 MED ORDER — IPRATROPIUM BROMIDE 0.02 % IN SOLN: 0.5000 mg | RESPIRATORY_TRACT | Status: AC

## 2012-11-02 MED ORDER — ALPRAZOLAM 0.5 MG PO TABS
0.5000 mg | ORAL_TABLET | Freq: Two times a day (BID) | ORAL | Status: DC
  Administered 2012-11-02 – 2012-11-03 (×3): 0.5 mg via ORAL
  Filled 2012-11-02 (×3): qty 1

## 2012-11-02 MED ORDER — KETOROLAC TROMETHAMINE 30 MG/ML IJ SOLN
30.0000 mg | Freq: Four times a day (QID) | INTRAMUSCULAR | Status: DC
  Administered 2012-11-02 – 2012-11-03 (×4): 30 mg via INTRAVENOUS
  Filled 2012-11-02 (×6): qty 1

## 2012-11-02 MED ORDER — KETOROLAC TROMETHAMINE 30 MG/ML IJ SOLN: 30.0000 mg | Freq: Four times a day (QID) | INTRAMUSCULAR | Status: AC

## 2012-11-02 MED ORDER — HYDROMORPHONE HCL 4 MG PO TABS: 4.0000 mg | ORAL_TABLET | ORAL | Status: AC | PRN

## 2012-11-02 MED ORDER — ALBUTEROL SULFATE (5 MG/ML) 0.5% IN NEBU: 2.5000 mg | INHALATION_SOLUTION | Freq: Four times a day (QID) | RESPIRATORY_TRACT | Status: DC

## 2012-11-02 MED ORDER — ONDANSETRON HCL 4 MG PO TABS: 4.0000 mg | ORAL_TABLET | Freq: Four times a day (QID) | ORAL | Status: AC | PRN

## 2012-11-02 MED ORDER — FUROSEMIDE 40 MG PO TABS: 40.0000 mg | ORAL_TABLET | Freq: Every morning | ORAL | Status: AC

## 2012-11-02 MED ORDER — DEXAMETHASONE SODIUM PHOSPHATE 10 MG/ML IJ SOLN
10.0000 mg | Freq: Two times a day (BID) | INTRAMUSCULAR | Status: DC
  Administered 2012-11-02 – 2012-11-03 (×2): 10 mg via INTRAVENOUS
  Filled 2012-11-02 (×3): qty 1

## 2012-11-02 NOTE — Progress Notes (Addendum)
Palliative Medicine Team Progress Note   S: Mood is down, seems frustrated. Noted prior assessments re: mental health and stressors, request to not discuss disposition.   Pain is better now, best has been 4/10 down from admission of 9/10-feels changes yesterday were helpful.  Still having issues with SOB, reports nebs are most helpful.  Anxiety level is high, she denies need for additional medication.  Filed Vitals:   11/02/12 0451  BP: 140/80  Pulse: 80  Temp: 97.5 F (36.4 C)  Resp: 18    Assessment/Plan:  Major issues:  1. Psychosocial issues- family and patient in crisis, anger, resentment, frustration are paramount in context of their grief, conflict mostly with her motehr and sister, she reports no issues with her son who she feels is most understanding and she is most sad about having to leave and how her illness has affected him. [facility] CSW note reviewed JY:NWGNFA and BP paperwork.   Spiritual Pain  EOL Guilt related to behavior -ie smoking tec..  Body image issues, related to cancer and weight loss  Greatest struggle is with her loss of control-major driving force of her decisions and behavior.  Mistrust of healthcare providers  2. Symptom Management Issues remain unresolved but better: Bone Pain from Osseous mets She is now s/p initiation of decadron IV, scheduled Toradol, and Iv bisphosphonate therapy and responded very well.   Increase Decadron IV to 10mg  BID  Increase Toradol to 30mg  IV q6 hours  Dyspnea- severe she reports the most helpful treatnment are nebulizers. I discussed need to increase morphine or to consider long acting meds.  Scheduled nebs q4 hours per her request, q2 remain  Will hold starting long acting MS Contin for now, continue prn dosing for breakthrough pain and dyspnea  Anxiety/Grief/Panic/Air hunger: Prn Ativan, will schedule alprazolam BID Time: 35 minutes. Greater than 50%  of this time was spent counseling and coordinating  care related to the above assessment and plan.   Anderson Malta, DO Palliative Medicine

## 2012-11-02 NOTE — Progress Notes (Signed)
Completed financial assessment for Kristin [name] with pt.  Pt feeling down today stating that it is all too much.  Provided counseling and support in trying to help pt to cope with situation.  [facility] social worker to follow up with pt once she is admitted there.  Wynonia Hazard, LCSW

## 2012-11-02 NOTE — Progress Notes (Addendum)
Clinical Social Work Department BRIEF PSYCHOSOCIAL ASSESSMENT 11/02/2012  Patient:  Kristin [name], Kristin [name]     Account Number:  [id number]     Admit date:  10/26/2012  Clinical Social Worker:  Jacelyn Grip  Date/Time:  11/02/2012 11:15 AM  Referred by:  Physician  Date Referred:  11/02/2012 Referred for  Residential hospice placement   Other Referral:   Interview type:  Patient Other interview type:    PSYCHOSOCIAL DATA Living Status:  FAMILY Admitted from facility:   Level of care:   Primary support name:  Dois Davenport Runion/sister Primary support relationship to patient:  SIBLING Degree of support available:   adequate    CURRENT CONCERNS Current Concerns  Post-Acute Placement   Other Concerns:    SOCIAL WORK ASSESSMENT / PLAN CSW received notification that pt is agreeable to residential hospice placement.    Per [facility] SW note, pts mother and sister are stating that they do not want pt to come back home.  They feel that they cannot continue to provide care for her.  Per [facility] SW, Pt reluctant to go to [facility] but is agreeable to do so for symptom management and bed will be available at [facility] tomorrow, Friday 11/03/2012.    CSW met with pt at bedside. Pt confirmed plan for [facility] for tomorrow. Pt request that staff not continue to discuss pt going to [facility]. Pt states that she has made her decision to go to [facility] for symptom management and does not want to continue to discuss the plan.  CSW respected this wish and notified pt that CSW will communicate this request to staff.    CSW to facilitate pt discharge needs to [facility] on Friday.   Assessment/plan status:  Psychosocial Support/Ongoing Assessment of Needs Other assessment/ plan:   discharge planning   Information/referral to community resources:   Referral had been previously made to [employer] by [employer].    PATIENT'S/FAMILY'S RESPONSE TO PLAN OF CARE: Pt alert and oriented x 4. Pt  guarded, but agrees to plan for [facility] tomorrow and request that noone else discusses the plan with her until she is being transferred to [facility].     Jacklynn Lewis, MSW, LCSWA  Clinical Social Work [phone]

## 2012-11-02 NOTE — Progress Notes (Signed)
PT does not wear a BIPAP per PT

## 2012-11-02 NOTE — Discharge Summary (Addendum)
Physician Discharge Summary  Kristin [name] EAV:[id number] DOB: 02/12/1966 DOA: 10/26/2012  PCP: [name], MD  Admit date: 10/26/2012 Discharge date: 11/02/2012  Recommendations for Outpatient Follow-up:  1. Patient to be transferred to [facility] place 11/03/2012.  Discharge Diagnoses:  Active Problems:   Malignant pleural effusion   Pulmonary infiltrates   Hypothyroidism   Panic attack   Lymphangitic lung metastasis   Respiratory distress  Discharge Condition: medically stable to be transferred to residential hospice  Diet recommendation: as tolerated  History of present illness:  47 year old woman with a past medical history bloating but not limited to lungs, stage IV breast cancer with metastasis to liver and bone who was admitted to the hospital on 10/26/12 due to worsening shortness of breath. Patient has had multiple admissions to hospitals for the same problem. Initially, patient only agreed to being discharged home with hospice. Due to progressive decline including but not limited to increased work of breathing, air hunger patient has agreed to residential hospice placement.  Assessment/Plan:   Principal problem:  Acute hypoxic respiratory failure secondary to lymphangitic lung metastasis  Secondary to advanced metastatic breast cancer lymphangitic spread of breast cancer  Respiratory status is stable currently. Patient does get short of breath with minimal talking. Nebulizer treatments every 4 hours will be continued in hospice. Prescription provided for MS Contin scheduled, Decadron 10 mg twice daily. Patient has received 7 days of IV antibiotics. IV antibiotics are not needed at the time of discharge. Appreciate palliative care team following and their recommendations on symptom management. Active Problems:  Stage IV breast cancer  Under hospice care Chronic cancer related pain  Continue fentanyl patch 50 mcg every 72 hours.  Prescription provided for MS Contin  scheduled to 60 mg twice daily and Dilaudid as needed Anemia of chronic disease  Secondary to history of advanced metastatic breast cancer. No further blood draws. Comfort care. Hypothyroidism  Continue Synthroid. Anxiety/panic attack  Alprazolam BID  Code status: DNR/DNI Family education: Family not at bedside Disposition: to residential hospice  Consultants:  Palliative care  Antibiotics:  Zosyn, received for 7 days.   Manson Passey, MD  [facility]  11/02/2012, 3:34 PM  Pager #: [phone]   Discharge Exam: Filed Vitals:   11/02/12 0451  BP: 140/80  Pulse: 80  Temp: 97.5 F (36.4 C)  Resp: 18   Filed Vitals:   10/31/12 1004 11/01/12 0559 11/01/12 0601 11/02/12 0451  BP:  152/108 138/78 140/80  Pulse:  101  80  Temp:  97.7 F (36.5 C)  97.5 F (36.4 C)  TempSrc:  Oral  Oral  Resp:  14  18  Height:      Weight:      SpO2: 92% 100%  100%    General: Pt is alert, short of breath when tries to speak Cardiovascular: Regular rate and rhythm, S1/S2 + Respiratory: rhonchi appreciated throughout, diminished breath sounds Abdominal: Soft, non tender, non distended, bowel sounds +, no guarding Extremities: no edema, no cyanosis, pulses palpable bilaterally DP and PT Neuro: Grossly nonfocal  Discharge Instructions  Discharge Orders   Future Appointments Provider Department Dept Phone   11/20/2012 11:15 AM Krista Blue [facility] MEDICAL ONCOLOGY [phone]   11/20/2012 11:45 AM Amy Allegra Grana, PA-C [facility] MEDICAL [facility]COL[facility] [phone]   11/20/2012 12:45 PM [facility] MED[facility] [phone]   Future Orders Complete By Expires     Call MD for:  difficulty breathing, headache or visual  disturbances  As directed     Call MD for:  persistant dizziness or light-headedness  As directed     Call MD for:  persistant nausea and vomiting  As directed     Call MD for:  severe uncontrolled pain  As directed      Diet - low sodium heart healthy  As directed     Increase activity slowly  As directed         Medication List    STOP taking these medications       albuterol (2.5 MG/3ML) 0.083% nebulizer solution  Commonly known as:  PROVENTIL     anastrozole 1 MG tablet  Commonly known as:  ARIMIDEX     LORazepam 1 MG tablet  Commonly known as:  ATIVAN     metoCLOPramide 10 MG tablet  Commonly known as:  REGLAN     metroNIDAZOLE 500 MG tablet  Commonly known as:  FLAGYL     predniSONE 10 MG tablet  Commonly known as:  STERAPRED UNI-PAK     saccharomyces boulardii 250 MG capsule  Commonly known as:  FLORASTOR     sodium chloride 0.9 % SOLN with morphine 10 MG/ML SOLN 1 mg/mL     venlafaxine XR 75 MG 24 hr capsule  Commonly known as:  EFFEXOR-XR      TAKE these medications       albuterol 108 (90 BASE) MCG/ACT inhaler  Commonly known as:  PROVENTIL HFA;VENTOLIN HFA  Inhale 1-2 puffs into the lungs every 6 (six) hours as needed for wheezing.     albuterol (5 MG/ML) 0.5% nebulizer solution  Commonly known as:  PROVENTIL  Take 0.5 mLs (2.5 mg total) by nebulization every 4 (four) hours.     ALPRAZolam 0.5 MG tablet  Commonly known as:  XANAX  Take 1 tablet (0.5 mg total) by mouth 2 (two) times daily.     dexamethasone 2 MG tablet  Commonly known as:  DECADRON  Take 5 tablets (10 mg total) by mouth 2 (two) times daily with a meal.     fentaNYL 50 MCG/HR  Commonly known as:  DURAGESIC - dosed mcg/hr  Place 1 patch (50 mcg total) onto the skin every 3 (three) days.     furosemide 40 MG tablet  Commonly known as:  LASIX  Take 1 tablet (40 mg total) by mouth every morning.     HYDROmorphone 4 MG tablet  Commonly known as:  DILAUDID  Take 1 tablet (4 mg total) by mouth every 4 (four) hours as needed for pain.     ipratropium 0.02 % nebulizer solution  Commonly known as:  ATROVENT  Take 2.5 mLs (0.5 mg total) by nebulization every 4 (four) hours.     ketorolac 30  MG/ML injection  Commonly known as:  TORADOL  Inject 1 mL (30 mg total) into the vein every 6 (six) hours.     levothyroxine 150 MCG tablet  Commonly known as:  SYNTHROID, LEVOTHROID  Take 150 mcg by mouth every morning.     morphine 60 MG 12 hr tablet  Commonly known as:  MS CONTIN  Take 1 tablet (60 mg total) by mouth 2 (two) times daily.     omeprazole 20 MG capsule  Commonly known as:  PRILOSEC  Take 40 mg by mouth 2 (two) times daily.     ondansetron 4 MG tablet  Commonly known as:  ZOFRAN  Take 1 tablet (4 mg total) by mouth every 6 (  six) hours as needed for nausea.          The results of significant diagnostics from this hospitalization (including imaging, microbiology, ancillary and laboratory) are listed below for reference.    Significant Diagnostic Studies: Dg Chest Port 1 View  10/28/2012  *RADIOLOGY REPORT*  Clinical Data: Shortness of breath, weakness  PORTABLE CHEST - 1 VIEW  Comparison: 10/26/2012  Findings: Cardiomediastinal silhouette is stable.  There is worsening airspace disease bilateral upper lobe and right lower lobe.  Degenerative changes right shoulder again noted.  IMPRESSION: Worsening airspace disease bilateral upper lobe and right lower lobe.   Original Report Authenticated By: Natasha Mead, M.D.    Dg Chest Portable 1 View  10/26/2012  *RADIOLOGY REPORT*  Clinical Data: Shortness of breath.  History of metastatic breast cancer.  PORTABLE CHEST - 1 VIEW  Comparison: 10/08/2012 and chest CT 07/31/2012  Findings: Persistently very low lung volumes.  Extensive architectural distortion in the medial lungs and upper lobes bilaterally is unchanged.  There is diffuse bilateral airspace disease is similar to recent prior examinations.  Aeration in the medial right lung base appears worse than on today's chest radiograph compared to the chest radiograph of 10/08/2012.  Left costophrenic angle is blunted and probable chronic loculated pleural fluid in the upper  hemithoraces bilaterally, left greater than right.  Irregular sclerotic area in the right scapula is consistent with bony metastatic disease.  There are multiple areas of sclerosis in the thoracic spine consistent with bony metastatic disease.  There is height loss of an upper to mid thoracic spine vertebral body consistent with a compression deformity, as seen on prior CT.  IMPRESSION:  1.  Bilateral airspace disease, right worse than left, is similar compared to October 08, 2012.  Aeration at the right medial lung base is slightly worse on today's examination.  2.  Chronic, loculated appearing small bilateral pleural effusions bilaterally. 3.  Extensive bony metastatic disease.   Original Report Authenticated By: Britta Mccreedy, M.D.    Dg Chest Portable 1 View  10/08/2012  *RADIOLOGY REPORT*  Clinical Data: Respiratory distress.  PORTABLE CHEST - 1 VIEW  Comparison: 10/06/2012  Findings: Severe diffuse bilateral airspace disease.   I suspect the majority of the opacities are related to chronic lung disease and fibrosis. Appearance is stable dating back to December of last year.  Stable left apical pleural thickening.  Heart is normal size.  Right PICC line is in place with the tip in the SVC.  IMPRESSION: Stable severe bilateral airspace opacities, likely chronic lung disease.  Right PICC line tip in the SVC.   Original Report Authenticated By: Charlett Nose, M.D.    Dg Chest Port 1 View  10/06/2012  *RADIOLOGY REPORT*  Clinical Data: Evaluate right PICC line place  PORTABLE CHEST - 1 VIEW  Comparison: 10/03/2012  Findings: Low lung volumes.  Stable bilateral upper lobe and left lower lobe opacities with scarring/fibrosis.  Small left pleural effusion with left apical capping.  No pneumothorax.  Right arm PICC terminates in the lower SVC.  The heart is top normal in size.  IMPRESSION: Right arm PICC terminates in the lower SVC.  No pneumothorax.  Stable bilateral upper lobe and left lower lobe opacities with  scarring/fibrosis.  Small left pleural effusion with left apical capping.   Original Report Authenticated By: Charline Bills, M.D.     Microbiology: No results found for this or any previous visit (from the past 240 hour(s)).   Labs: Basic Metabolic Panel:  Recent Labs Lab 10/26/12 1640 10/31/12 0355  NA 138 137  K 3.4* 3.4*  CL 97 95*  CO2 33* 34*  GLUCOSE 220* 95  BUN 8 12  CREATININE 0.81 1.01  CALCIUM 8.9 8.8   Liver Function Tests: No results found for this basename: AST, ALT, ALKPHOS, BILITOT, PROT, ALBUMIN,  in the last 168 hours No results found for this basename: LIPASE, AMYLASE,  in the last 168 hours No results found for this basename: AMMONIA,  in the last 168 hours CBC:  Recent Labs Lab 10/26/12 1640  WBC 7.6  NEUTROABS 7.1  HGB 10.1*  HCT 31.6*  MCV 79.0  PLT 272   Cardiac Enzymes: No results found for this basename: CKTOTAL, CKMB, CKMBINDEX, TROPONINI,  in the last 168 hours BNP: BNP (last 3 results)  Recent Labs  07/31/12 1426 08/04/12 0340  PROBNP 1950.0* 316.8*   CBG: No results found for this basename: GLUCAP,  in the last 168 hours  Time coordinating discharge: Over 30 minutes  Signed:

## 2012-11-02 NOTE — Progress Notes (Signed)
Room 1328 - Kristin [name] - [facility] RN Visit-R.Eural Holzschuh RN  Related admission to [facility] diagnosis of metastatic breast cancer.  Pt is DNR code.    Pt alert & oriented, lying upright in bed, with complaints of needing a breathing treatment now- appears dyspneic- staff RN Noreene Larsson to order.  PMT Dr. Phillips Odor added "bone pain cocktail" (toradol, bisphosphonate, and BID decadron) for bone pain/diffuse osseous disease- pt has reported intense 9-10/10 pain in the back - pt admits to some improvement today with change in medications.  Pt agrees to discharge to [facility] for continued symptom management - per [facility] SW note, bed available tomorrow 3/28. Per notes and conversation with hospital SW, pt does NOT want to talk about discharge plan to [facility] with anyone- staff RN Noreene Larsson is aware and advising anyone going into patient's room.   No family present.   Latest notes, MAR, labs faxed to [facility] for planned admit there on Friday 3/28.   Please call [facility] @ [phone]- ask for RN Liaison or after hours,ask for on-call RN with any hospice needs.   Thank you.  [name], RN  [facility]  Hospice Liaison

## 2012-11-02 NOTE — Progress Notes (Signed)
Found pt getting ready to order breakfast. Pt thought I had visited with her yesterday.  Talked  about her son and siblings as well as her battle with cancer. I listened and provided ministry of presence and emotional support.  Rutherford Nail Chaplain

## 2012-11-03 ENCOUNTER — Telehealth: Payer: Self-pay | Admitting: *Deleted

## 2012-11-03 NOTE — Telephone Encounter (Signed)
VERBAL ORDER AND READ BACK TO DR.MAGRINAT- MAY USE LIQUID OR SUPPOSITORY MEDICATIONS. PT. MAY HAVE A PICC LINE. DR.MAGRINAT WILL BE COMING TO [facility] THIS EVENING TO SEE PT. NOTIFIED HEATHER OF THE ABOVE INFORMATION. SHE VOICES UNDERSTANDING.

## 2012-11-03 NOTE — Progress Notes (Signed)
Had neb treatment around 2am, now struggling to breathe. Morphine given to ease respirations. Will cont to monitor.

## 2012-11-03 NOTE — Progress Notes (Signed)
[facility] PROGRESS NOTE  Kristin [name] ZOX:[id number] DOB: 1966/01/14 DOA: 10/26/2012 PCP: [name], MD  Brief narrative: Kristin [name] is an 47 y.o. female with a past medical history of stage IV breast cancer with metastasis to liver and bone who was admitted to the hospital on 10/26/12 due to worsening shortness of breath.  Assessment/Plan: Principal problem:  Acute hypoxic respiratory failure secondary to lymphangitic lung metastasis  Secondary to advanced metastatic breast cancer lymphangitic spread of breast cancer.  Respiratory status is stable currently. Patient does get short of breath with minimal talking. Nebulizer treatments every 4 hours will be continued in hospice.  Continue MS Contin scheduled, Decadron 10 mg twice daily.  Patient has received 7 days of IV antibiotics. IV antibiotics are not needed at the time of discharge.  Patient has been followed by the palliative care team for help with symptom management while in the hospital. Active Problems:  Stage IV breast cancer  Under hospice care, discharge to residential hospice today. Chronic cancer related pain  Continue fentanyl patch 50 mcg every 72 hours.  Prescription provided for MS Contin scheduled to 60 mg twice daily and Dilaudid as needed. Anemia of chronic disease  Secondary to history of advanced metastatic breast cancer. No further blood draws. Comfort care. Hypothyroidism  Continue Synthroid. Anxiety/panic attack  Continue Alprazolam BID  Code status: DNR/DNI  Family education: Family not at bedside.  Disposition: Residential hospice.    Medical Consultants:  Dr. Anderson Malta, Palliative Care.  Anti-infectives:  Zosyn 10/26/2012--->11/03/12  HPI/Subjective: Kristin [name] is moderately short of breath with conversation. She is confused with poor memory. She does have some chronic pain issues but these appeared be under reasonable control.  Objective: Filed Vitals:    11/02/12 0451 11/02/12 2020 11/02/12 2338 11/03/12 0535  BP: 140/80   144/84  Pulse: 80 80 82 113  Temp: 97.5 F (36.4 C)   98 F (36.7 C)  TempSrc: Oral   Oral  Resp: 18 18 18 14   Height:      Weight:      SpO2: 100% 100% 98% 100%   No intake or output data in the 24 hours ending 11/03/12 0945  Exam: Gen:  NAD Cardiovascular:  RRR, No M/R/G Respiratory:  Lungs with scattered rhonchi Gastrointestinal:  Abdomen softly distended, non-tender, + BS Extremities:  No C/E/C  Data Reviewed: Basic Metabolic Panel:  Recent Labs Lab 10/31/12 0355  NA 137  K 3.4*  CL 95*  CO2 34*  GLUCOSE 95  BUN 12  CREATININE 1.01  CALCIUM 8.8   GFR Estimated Creatinine Clearance: 67.4 ml/min (by C-G formula based on Cr of 1.01).  Procedures and Diagnostic Studies: Dg Chest Port 1 View  10/28/2012  *RADIOLOGY REPORT*  Clinical Data: Shortness of breath, weakness  PORTABLE CHEST - 1 VIEW  Comparison: 10/26/2012  Findings: Cardiomediastinal silhouette is stable.  There is worsening airspace disease bilateral upper lobe and right lower lobe.  Degenerative changes right shoulder again noted.  IMPRESSION: Worsening airspace disease bilateral upper lobe and right lower lobe.   Original Report Authenticated By: Natasha Mead, M.D.    Dg Chest Portable 1 View  10/26/2012  *RADIOLOGY REPORT*  Clinical Data: Shortness of breath.  History of metastatic breast cancer.  PORTABLE CHEST - 1 VIEW  Comparison: 10/08/2012 and chest CT 07/31/2012  Findings: Persistently very low lung volumes.  Extensive architectural distortion in the medial lungs and upper lobes bilaterally is unchanged.  There is diffuse bilateral airspace disease is similar  to recent prior examinations.  Aeration in the medial right lung base appears worse than on today's chest radiograph compared to the chest radiograph of 10/08/2012.  Left costophrenic angle is blunted and probable chronic loculated pleural fluid in the upper hemithoraces  bilaterally, left greater than right.  Irregular sclerotic area in the right scapula is consistent with bony metastatic disease.  There are multiple areas of sclerosis in the thoracic spine consistent with bony metastatic disease.  There is height loss of an upper to mid thoracic spine vertebral body consistent with a compression deformity, as seen on prior CT.  IMPRESSION:  1.  Bilateral airspace disease, right worse than left, is similar compared to October 08, 2012.  Aeration at the right medial lung base is slightly worse on today's examination.  2.  Chronic, loculated appearing small bilateral pleural effusions bilaterally. 3.  Extensive bony metastatic disease.   Original Report Authenticated By: Britta Mccreedy, M.D.     Scheduled Meds: . albuterol  2.5 mg Nebulization Q4H  . ALPRAZolam  0.5 mg Oral BID  . dexamethasone  10 mg Intravenous Q12H  . fentaNYL  50 mcg Transdermal Q72H  . furosemide  40 mg Oral Daily  . ipratropium  0.5 mg Nebulization Q4H  . ketorolac  30 mg Intravenous Q6H  . levothyroxine  150 mcg Oral QAC breakfast  . pantoprazole  40 mg Oral QHS  . piperacillin-tazobactam (ZOSYN)  IV  3.375 g Intravenous Q8H  . sodium chloride  3 mL Intravenous Q12H   Continuous Infusions:   Time spent: 25 minutes.   LOS: 8 days   Kristin [name]  [facility] Pager [phone].  If 8PM-8AM, please contact night-coverage at [email or web address], password [facility] 11/03/2012, 9:45 AM

## 2012-11-03 NOTE — Progress Notes (Signed)
Room 1328 - Rolla Etienne - [facility] RN Visit-R.Braniya Farrugia RN  Related admission to [facility] diagnosis of metastatic breast cancer.  Pt is DNR code.  Pt alert, sitting up in bed, question some confusion as pt asked what [facility] was like - pt has had a stay at [facility] one month ago 10/06/2012.  Pt has pain level reported to staff RN of 7/10.  Pt had episode of respiratory distress in early am and was given MS to ease the work of breathing per RN staff note.  No family present.  Pt asked for chocolate milk this am - obtained at cafeteria for pt with approval of staff RN Raiford Noble.  Pt smiled at end of visit.   Pt is transferring to [facility] today.  Advised staff at [facility] and Dr. Barbee Shropshire pt will need recert visit prior to 4/16 (end of current cert period).  Discussed details of "bone pain cocktail" with Dr. Barbee Shropshire.    Please call [facility] @ [phone]- ask for RN Liaison or after hours,ask for on-call RN with any hospice needs.   Thank you.  [name], RN  [facility]  Hospice Liaison

## 2012-11-03 NOTE — Progress Notes (Signed)
Pt d/c to [facility] today via [facility] transport. Pt's mother was at bedside prior to d/c. Pt / family were in agreement with d/c plan.  Cori Razor LCSW [phone]

## 2012-11-03 NOTE — Progress Notes (Signed)
Report called to RN Greer Ee at [facility] .  [facility] notified for transport by SW Asher Muir.

## 2012-11-07 ENCOUNTER — Other Ambulatory Visit: Payer: Self-pay | Admitting: Oncology

## 2012-11-07 DIAGNOSIS — C50919 Malignant neoplasm of unspecified site of unspecified female breast: Secondary | ICD-10-CM

## 2012-11-09 ENCOUNTER — Encounter: Payer: Self-pay | Admitting: *Deleted

## 2012-11-09 ENCOUNTER — Ambulatory Visit (HOSPITAL_COMMUNITY): Payer: Medicare Other

## 2012-11-12 ENCOUNTER — Other Ambulatory Visit: Payer: Self-pay | Admitting: Oncology

## 2012-11-15 ENCOUNTER — Ambulatory Visit: Payer: Self-pay | Admitting: Physician Assistant

## 2012-11-15 ENCOUNTER — Other Ambulatory Visit: Payer: Self-pay | Admitting: Lab

## 2012-11-20 ENCOUNTER — Other Ambulatory Visit: Payer: Self-pay | Admitting: Lab

## 2012-11-20 ENCOUNTER — Ambulatory Visit: Payer: Self-pay

## 2012-11-20 ENCOUNTER — Ambulatory Visit: Payer: Self-pay | Admitting: Physician Assistant

## 2012-12-07 NOTE — Progress Notes (Signed)
Death notification from Garth Bigness at [facility] Patient expired today, 25-Nov-2012.

## 2012-12-07 DEATH — deceased

## 2012-12-18 ENCOUNTER — Encounter: Payer: Self-pay | Admitting: Oncology

## 2012-12-18 NOTE — Progress Notes (Signed)
Patient's mom came in with some bills. I advised her she must call the numbers on bill to advise them patient is deceased. No funds or discounts to be applied.

## 2013-05-29 ENCOUNTER — Other Ambulatory Visit: Payer: Self-pay | Admitting: *Deleted

## 2013-10-23 IMAGING — CR DG CHEST 2V
3 series · 3 of 3 positions shown · non-contrast
Comparison: Chest x-ray 12 May, 2012.

CLINICAL DATA: Altered mental status.  History of metastatic breast
cancer.

CHEST - 2 VIEW

[w chest lat]
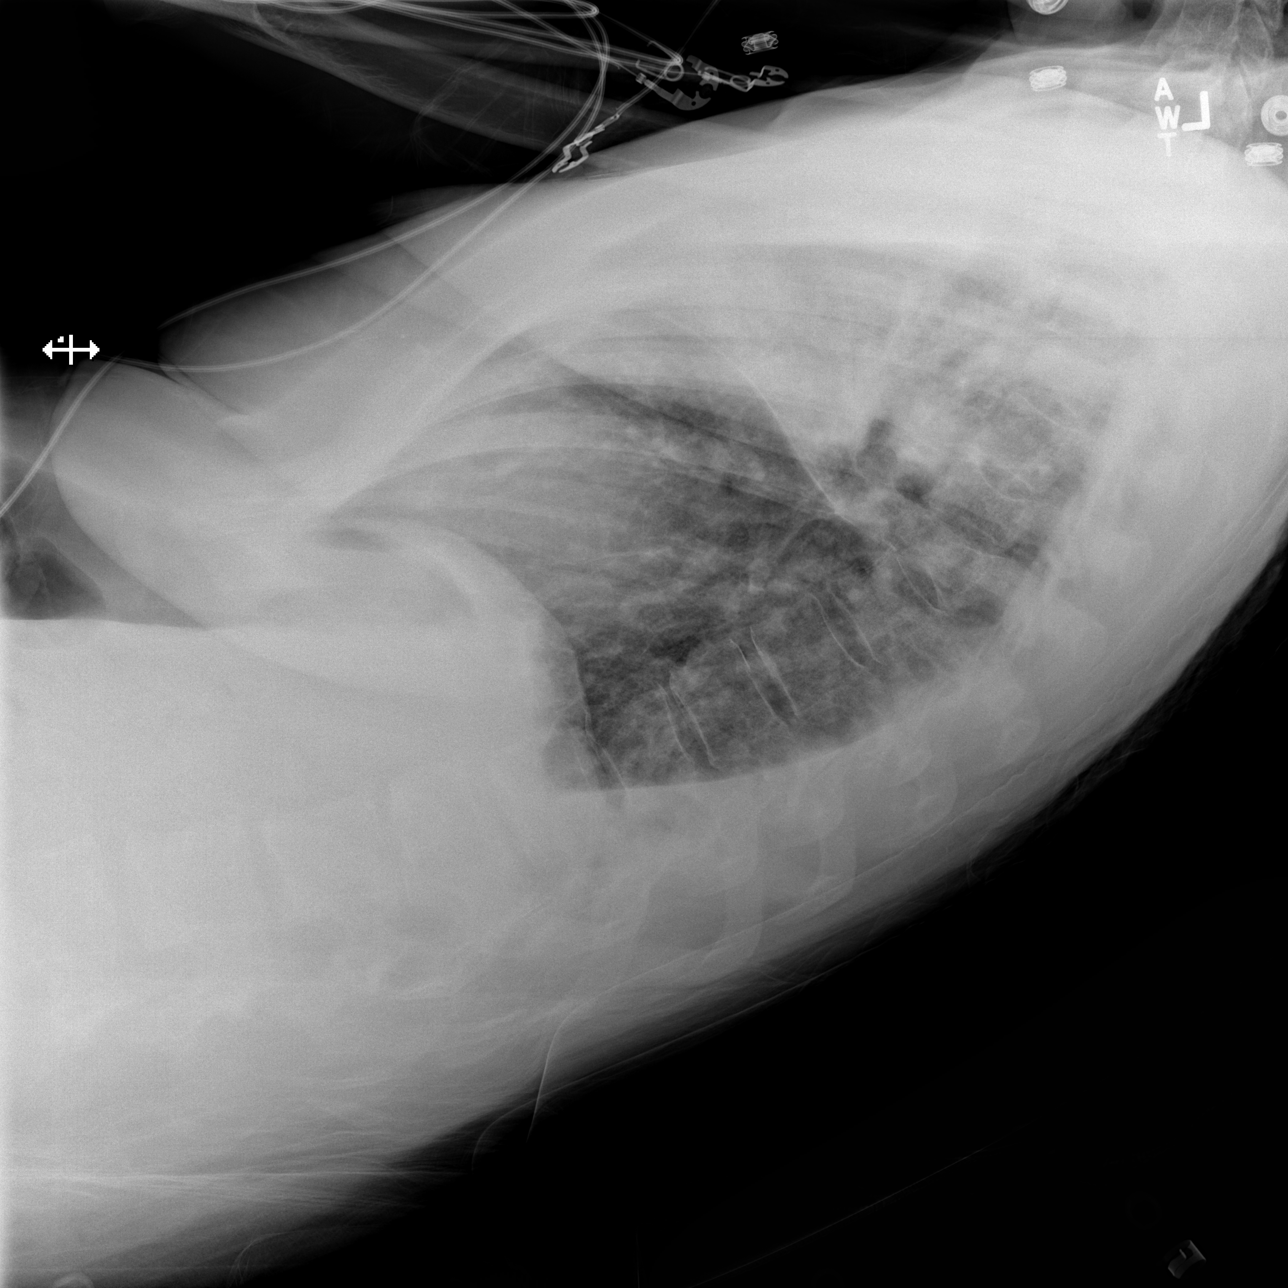

[x chest ap (1 of 2)]
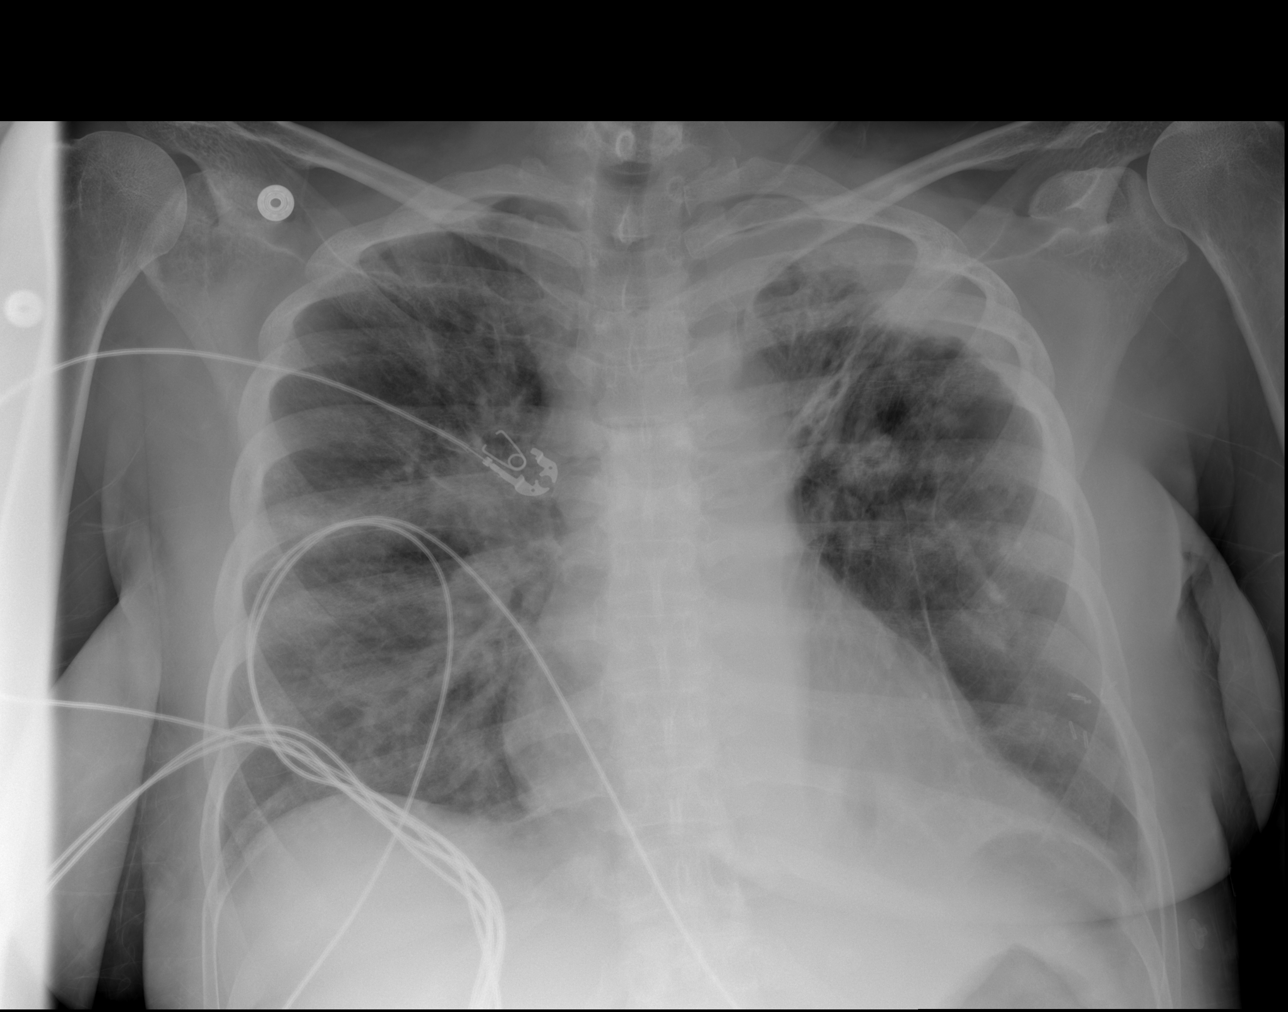

[x chest ap (2 of 2)]
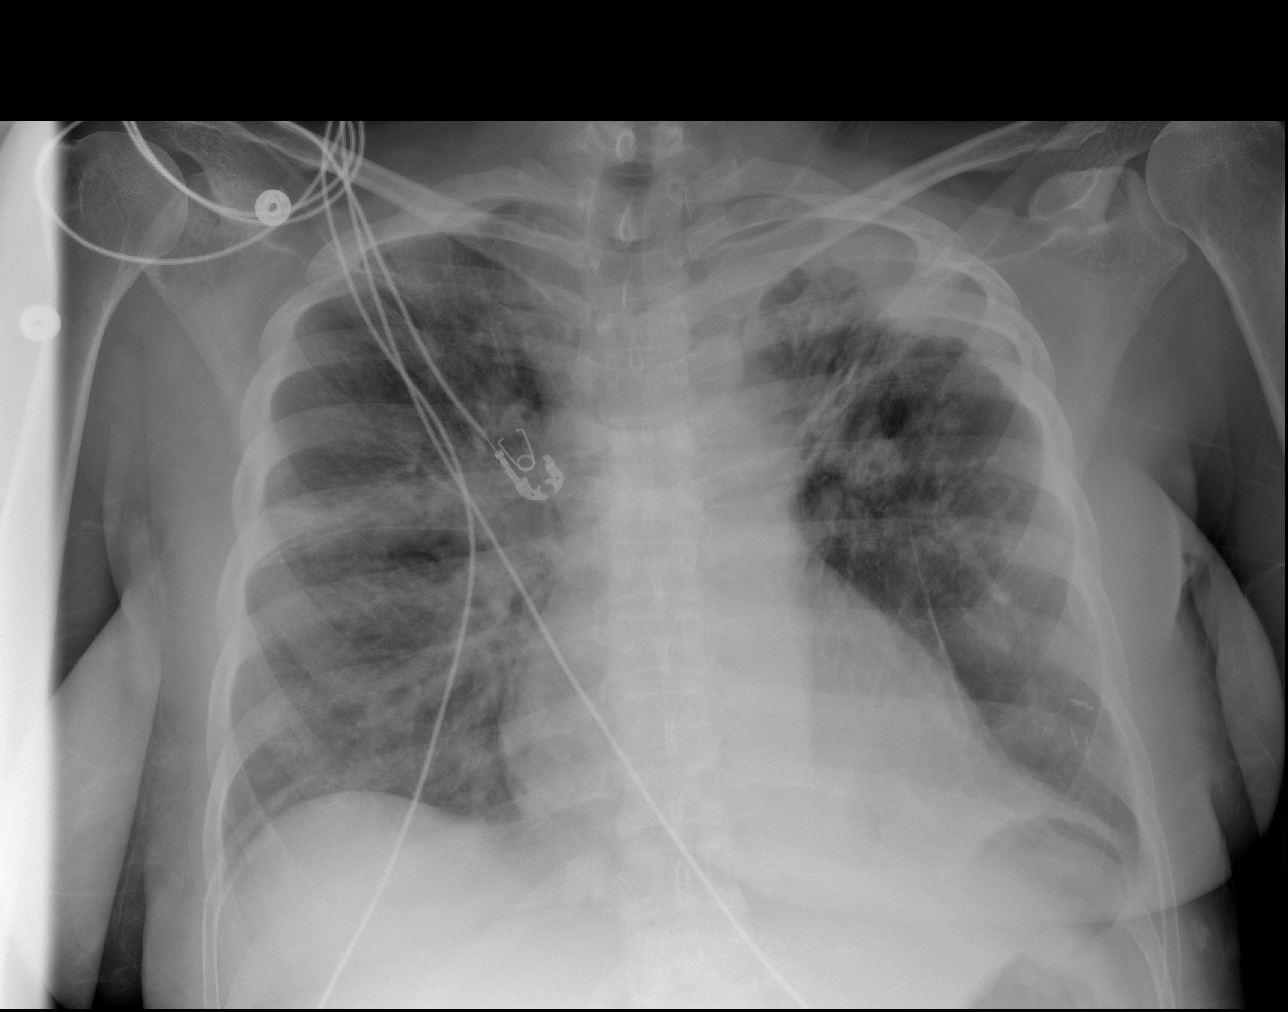

[3 of 3 positions shown; findings below may reference images not displayed]

FINDINGS: The cardiac silhouette, mediastinal and hilar contours
are stable.  Severe chronic lung changes are noted. Possible
superimposed right upper lobe infiltrate.  No obvious destructive
bone lesions.
IMPRESSION: 1.  Severe chronic lung changes.
2.  Possible superimposed right upper lobe infiltrate.

## 2013-10-23 IMAGING — CR DG PORTABLE PELVIS
1 series · 1 of 1 positions shown · non-contrast
Comparison: Abdomen 05/12/2012

CLINICAL DATA: Bilateral hip pain.  No injury.

PORTABLE PELVIS

[AP]
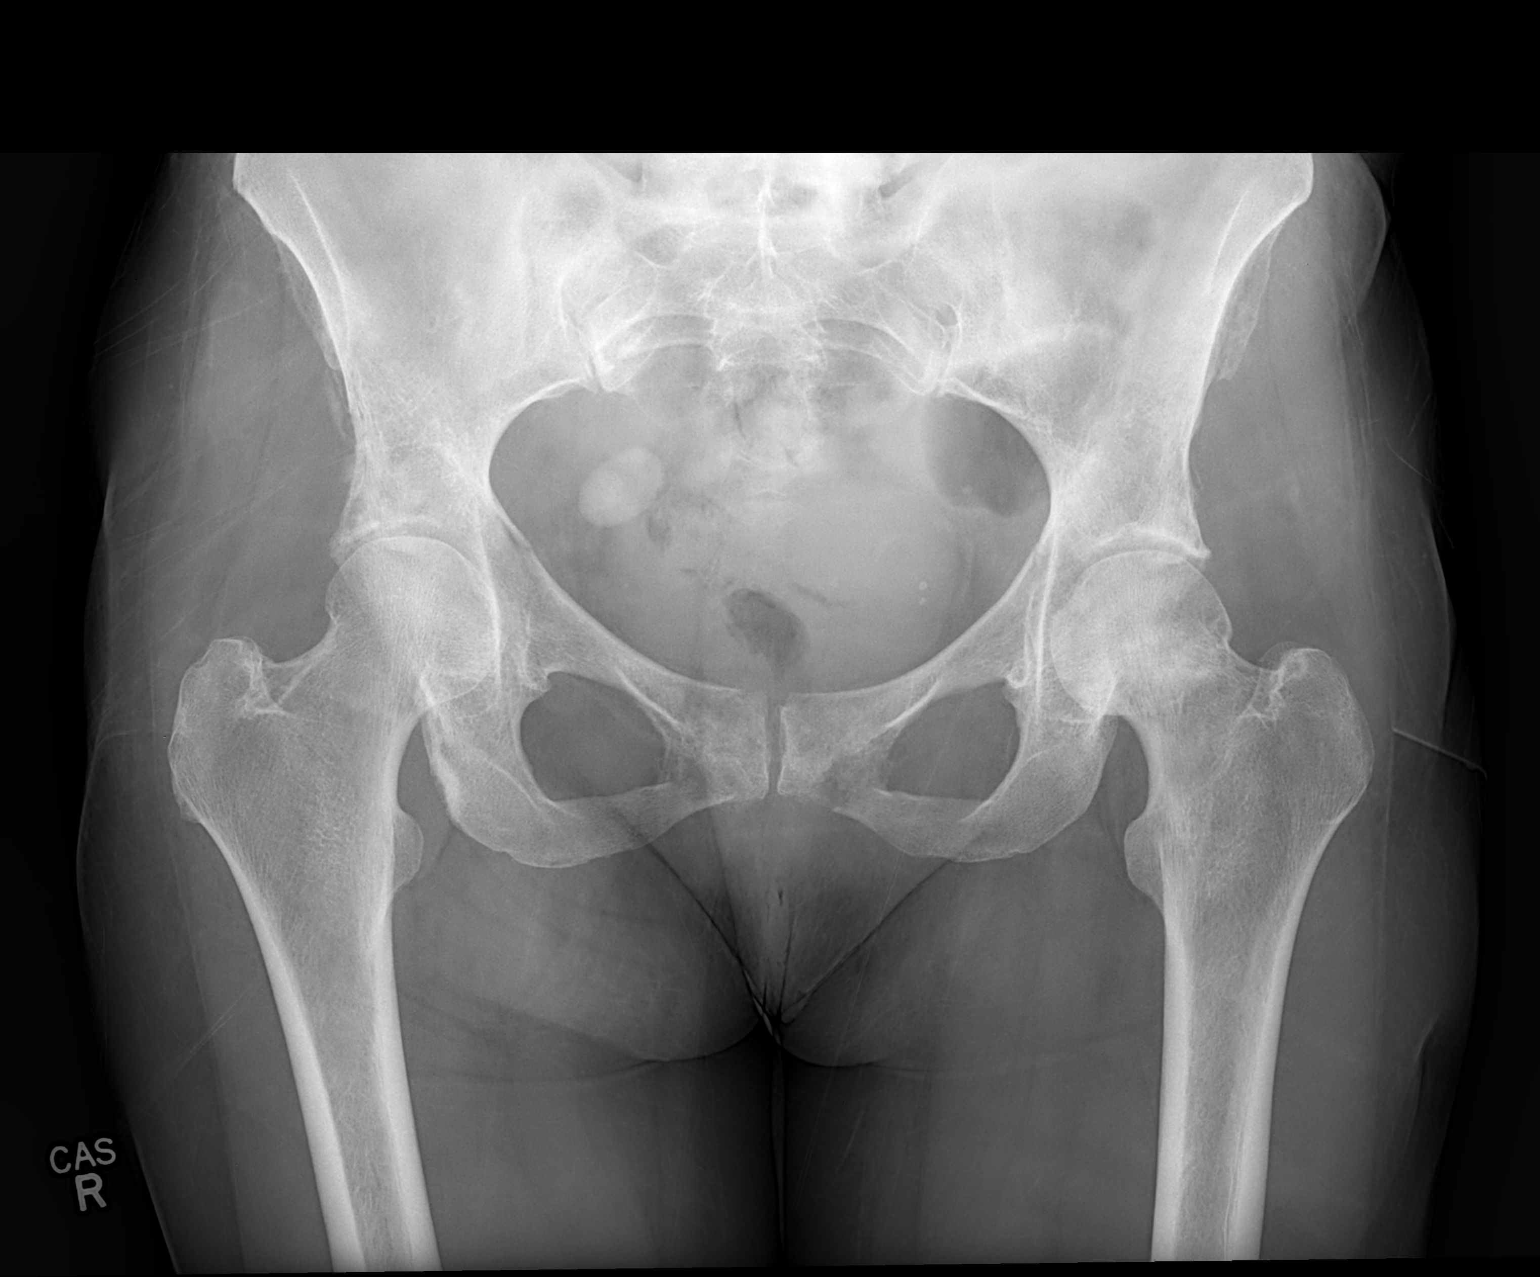

[1 of 1 positions shown; findings below may reference images not displayed]

FINDINGS: Heterogeneous areas of lucency and sclerosis in the right
superior acetabular region and in the symphysis pubis bilaterally.
Stable appearance of pathologic fractures of the right symphysis
pubis region since the previous abdominal film.  There is new
cortical lucency extending along the right innominate bone to the
base of the superior right pubic ramus suggesting new pathologic
fracture.  Heterogeneous sclerosis in the left femoral head
consistent with metastasis.  SI joints and symphysis pubis are not
displaced.
IMPRESSION: Changes consistent with known bony metastasis.  Apparent acute
nondisplaced pathologic fracture of the right innominate bone.
Stable appearance of previously demonstrated pathologic fractures
in the symphysis pubis.

## 2013-12-31 IMAGING — CR DG CHEST 1V PORT
1 series · 1 of 1 positions shown · non-contrast
Comparison: CT chest 05/24/2012, chest x-ray 05/23/2012

CLINICAL DATA: Breast cancer and short of breath

PORTABLE CHEST - 1 VIEW

[AP]
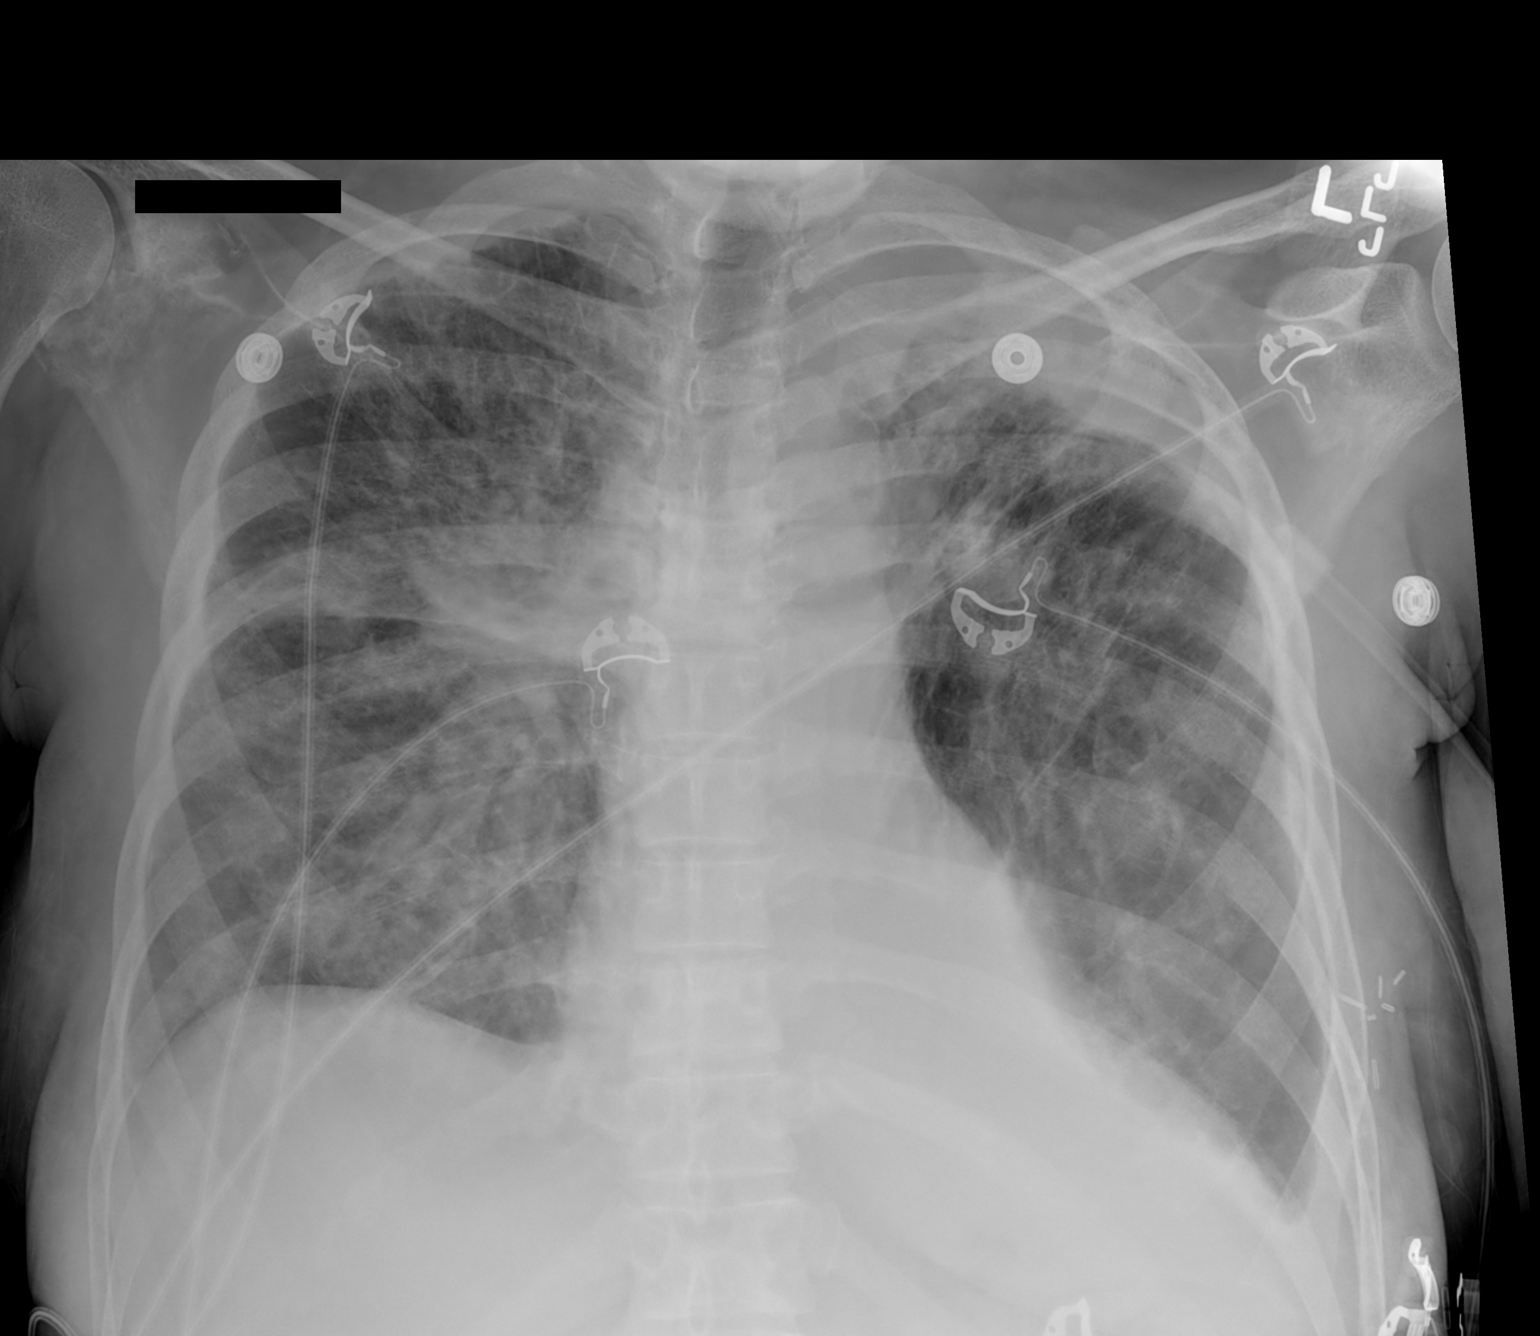

[1 of 1 positions shown; findings below may reference images not displayed]

FINDINGS: Progression of prominent interstitial markings
bilaterally especially in the right upper lobe and  right lower
lobe.  Right perihilar mass lesion appears larger.  This may be due
to lymphangitic spread of breast cancer.  Lung cancer not excluded.

Extensive pleural parenchymal scarring left upper lobe is
unchanged.     Heart size is normal. Increase in left effusion and
left lower lobe atelectasis.
IMPRESSION: Progression of prominent interstitial markings on the right with
perihilar mass lesion.  This may be due to lymphangitic spread of
tumor.

Increase in left lower lobe atelectasis and left effusion.

Extensive scarring in the left apex is stable.

## 2014-04-04 NOTE — Telephone Encounter (Signed)
Encounter was telephone call. 

## 2014-04-05 ENCOUNTER — Other Ambulatory Visit: Payer: Self-pay | Admitting: *Deleted

## 2014-08-25 ENCOUNTER — Other Ambulatory Visit: Payer: Self-pay | Admitting: Oncology

## 2022-03-01 ENCOUNTER — Other Ambulatory Visit (HOSPITAL_COMMUNITY): Payer: Self-pay | Admitting: *Deleted
